# Patient Record
Sex: Female | Born: 1945
Health system: Southern US, Community
[De-identification: ages and names within clinical notes are randomized; demographics above are authoritative.]

## PROBLEM LIST (undated history)

## (undated) DIAGNOSIS — G4730 Sleep apnea, unspecified: Secondary | ICD-10-CM

## (undated) DIAGNOSIS — F329 Major depressive disorder, single episode, unspecified: Secondary | ICD-10-CM

## (undated) DIAGNOSIS — I5032 Chronic diastolic (congestive) heart failure: Secondary | ICD-10-CM

## (undated) DIAGNOSIS — I499 Cardiac arrhythmia, unspecified: Secondary | ICD-10-CM

## (undated) DIAGNOSIS — G43909 Migraine, unspecified, not intractable, without status migrainosus: Secondary | ICD-10-CM

## (undated) DIAGNOSIS — F32A Depression, unspecified: Secondary | ICD-10-CM

## (undated) DIAGNOSIS — S82141A Displaced bicondylar fracture of right tibia, initial encounter for closed fracture: Secondary | ICD-10-CM

## (undated) DIAGNOSIS — K219 Gastro-esophageal reflux disease without esophagitis: Secondary | ICD-10-CM

## (undated) DIAGNOSIS — I509 Heart failure, unspecified: Secondary | ICD-10-CM

## (undated) DIAGNOSIS — M1990 Unspecified osteoarthritis, unspecified site: Secondary | ICD-10-CM

## (undated) DIAGNOSIS — F40240 Claustrophobia: Secondary | ICD-10-CM

## (undated) DIAGNOSIS — I341 Nonrheumatic mitral (valve) prolapse: Secondary | ICD-10-CM

## (undated) DIAGNOSIS — IMO0002 Reserved for concepts with insufficient information to code with codable children: Secondary | ICD-10-CM

## (undated) DIAGNOSIS — F988 Other specified behavioral and emotional disorders with onset usually occurring in childhood and adolescence: Secondary | ICD-10-CM

## (undated) DIAGNOSIS — R011 Cardiac murmur, unspecified: Secondary | ICD-10-CM

## (undated) DIAGNOSIS — R0600 Dyspnea, unspecified: Secondary | ICD-10-CM

## (undated) HISTORY — PX: VASCULAR SURGERY: SHX849

## (undated) HISTORY — PX: COLONOSCOPY W/ POLYPECTOMY: SHX1380

## (undated) HISTORY — PX: TONSILLECTOMY: SUR1361

## (undated) HISTORY — PX: RETINAL TEAR REPAIR CRYOTHERAPY: SHX5304

## (undated) HISTORY — PX: JOINT REPLACEMENT: SHX530

## (undated) HISTORY — PX: EYE SURGERY: SHX253

## (undated) HISTORY — PX: VARICOSE VEIN SURGERY: SHX832

## (undated) HISTORY — PX: FRACTURE SURGERY: SHX138

---

## 1973-07-20 HISTORY — PX: WRIST SURGERY: SHX841

## 1998-08-22 ENCOUNTER — Other Ambulatory Visit: Admission: RE | Admit: 1998-08-22 | Discharge: 1998-08-22 | Payer: Self-pay | Admitting: Obstetrics and Gynecology

## 1998-10-01 ENCOUNTER — Encounter: Payer: Self-pay | Admitting: *Deleted

## 1998-10-01 ENCOUNTER — Emergency Department (HOSPITAL_COMMUNITY): Admission: EM | Admit: 1998-10-01 | Discharge: 1998-10-01 | Payer: Self-pay | Admitting: *Deleted

## 2000-03-23 ENCOUNTER — Other Ambulatory Visit: Admission: RE | Admit: 2000-03-23 | Discharge: 2000-03-23 | Payer: Self-pay | Admitting: Obstetrics and Gynecology

## 2000-04-27 ENCOUNTER — Other Ambulatory Visit: Admission: RE | Admit: 2000-04-27 | Discharge: 2000-04-27 | Payer: Self-pay | Admitting: Obstetrics and Gynecology

## 2001-03-31 ENCOUNTER — Other Ambulatory Visit: Admission: RE | Admit: 2001-03-31 | Discharge: 2001-03-31 | Payer: Self-pay | Admitting: Obstetrics and Gynecology

## 2002-04-05 ENCOUNTER — Other Ambulatory Visit: Admission: RE | Admit: 2002-04-05 | Discharge: 2002-04-05 | Payer: Self-pay | Admitting: Obstetrics and Gynecology

## 2003-01-30 ENCOUNTER — Ambulatory Visit (HOSPITAL_COMMUNITY): Admission: RE | Admit: 2003-01-30 | Discharge: 2003-01-30 | Payer: Self-pay | Admitting: Gastroenterology

## 2003-01-30 ENCOUNTER — Encounter (INDEPENDENT_AMBULATORY_CARE_PROVIDER_SITE_OTHER): Payer: Self-pay | Admitting: Specialist

## 2003-04-16 ENCOUNTER — Other Ambulatory Visit: Admission: RE | Admit: 2003-04-16 | Discharge: 2003-04-16 | Payer: Self-pay | Admitting: Obstetrics and Gynecology

## 2006-02-14 ENCOUNTER — Emergency Department (HOSPITAL_COMMUNITY): Admission: EM | Admit: 2006-02-14 | Discharge: 2006-02-14 | Payer: Self-pay | Admitting: Emergency Medicine

## 2006-04-14 ENCOUNTER — Encounter: Admission: RE | Admit: 2006-04-14 | Discharge: 2006-04-14 | Payer: Self-pay | Admitting: Obstetrics and Gynecology

## 2007-04-19 ENCOUNTER — Encounter: Admission: RE | Admit: 2007-04-19 | Discharge: 2007-04-19 | Payer: Self-pay | Admitting: Obstetrics and Gynecology

## 2007-04-22 ENCOUNTER — Encounter: Admission: RE | Admit: 2007-04-22 | Discharge: 2007-04-22 | Payer: Self-pay | Admitting: Obstetrics and Gynecology

## 2007-04-29 ENCOUNTER — Encounter (INDEPENDENT_AMBULATORY_CARE_PROVIDER_SITE_OTHER): Payer: Self-pay | Admitting: Diagnostic Radiology

## 2007-04-29 ENCOUNTER — Encounter: Admission: RE | Admit: 2007-04-29 | Discharge: 2007-04-29 | Payer: Self-pay | Admitting: Obstetrics and Gynecology

## 2007-10-23 ENCOUNTER — Encounter: Admission: RE | Admit: 2007-10-23 | Discharge: 2007-10-23 | Payer: Self-pay | Admitting: Orthopedic Surgery

## 2007-11-21 ENCOUNTER — Encounter: Admission: RE | Admit: 2007-11-21 | Discharge: 2007-11-21 | Payer: Self-pay | Admitting: Obstetrics and Gynecology

## 2007-12-19 HISTORY — PX: KNEE ARTHROSCOPY: SHX127

## 2008-01-11 ENCOUNTER — Ambulatory Visit (HOSPITAL_BASED_OUTPATIENT_CLINIC_OR_DEPARTMENT_OTHER): Admission: RE | Admit: 2008-01-11 | Discharge: 2008-01-11 | Payer: Self-pay | Admitting: Orthopedic Surgery

## 2008-07-24 ENCOUNTER — Encounter: Admission: RE | Admit: 2008-07-24 | Discharge: 2008-07-24 | Payer: Self-pay | Admitting: Obstetrics and Gynecology

## 2009-12-19 ENCOUNTER — Encounter: Admission: RE | Admit: 2009-12-19 | Discharge: 2009-12-19 | Payer: Self-pay | Admitting: Obstetrics and Gynecology

## 2010-08-09 ENCOUNTER — Encounter: Payer: Self-pay | Admitting: Family Medicine

## 2010-08-10 ENCOUNTER — Encounter: Payer: Self-pay | Admitting: Obstetrics and Gynecology

## 2010-12-02 NOTE — Op Note (Signed)
NAMEAZURE, BUDNICK                 ACCOUNT NO.:  [id number]   MEDICAL RECORD NO.:  [id number]          PATIENT TYPE:  AMB   LOCATION:  [facility]                         FACILITY:  [facility]   PHYSICIAN:  Ollen Gross, M.D.    DATE OF BIRTH:  [birth year]   DATE OF PROCEDURE:  01/11/2008  DATE OF DISCHARGE:                               OPERATIVE REPORT   PREOPERATIVE DIAGNOSES:  Right knee chondral defect.   POSTOPERATIVE DIAGNOSES:  Right knee chondral defect.  Medial meniscal  tear.   PROCEDURE:  Right-knee arthroscopy with medial meniscal debridement and  chondroplasty, medial and lateral compartments.   SURGEON:  Ollen Gross, M.D.   ASSISTANT:  None.   ANESTHESIA:  General.   ESTIMATED BLOOD LOSS:  Minimal.   DRAINS:  None.   COMPLICATIONS:  None.   CONDITION:  Stable to recovery.   Ms. Brotzman is a 65 year old female, who has had a several-month history of  significantly worsening right-knee pain and mechanical symptoms.  Exam  and history suggested meniscal tear, possible chondral defect.  MRI  confirmed the defect.  She has failed nonoperative management and  presents for arthroscopy and debridement.   PROCEDURE IN DETAIL:  After successful administration of general  anesthetic, a tourniquet was placed high on the right thigh and her  right lower extremity was prepped and draped in the usual sterile  fashion.  Standard superomedial and inferolateral incisions were made,  inflow cannula passed superomedial, camera passed inferolateral.  Arthroscopic visualization proceeds.  The undersurface of the patella  has some grade 2 change.  The trochlea has some grade 3 and 4 change,  especially centrally.  There does not appear to be any unstable  cartilage in the trochlea.  The medial and lateral gutters are  visualized and there are no loose bodies.   Flexion in valgus was applied to the knee and the medial compartment was  entered.  She has a significant tear in the body and  posterior horn of  the medial meniscus.  There is also a chondral defect with some exposed  bone on a posteromedial aspect of the medial tibial plateau and a larger  chondral delamination on the medial-most aspect of the medial femoral  condyle, starting centrally and coursing posteriorly.   A spinal needle was used to localize the inferomedial portal, a small  incision made and dilator placed.  The meniscus was debrided back to a  stable base with baskets and a 4.2 mm shaver and sealed off with the  ArthroCare device.  The chondral defects on the tibial plateau and femur  are debrided back to stable bony bases and stable cartilaginous edges.  The defect on the tibia was probably about 0.5 by 1 cm and on the femur  about 1 by 2 cm.  I abraded the bone for hopeful formation of some  fibrocartilage.  The intercondylar notch was visualized.  The ACL was  normal.  The lateral compartment was entered and the meniscus and femur  are normal.  There is an unusual chondral defect on the lateral tibial  plateau.  It measured about 0.5 by 1 cm and it looked like a clinical  punched-out area.  I debrided the central aspect down to a stable bony  base and the edges to stable cartilaginous edges.  We probed and found  it to be stable.   The joint was again inspected.  No further tears, loose bodies or  defects.  The arthroscopic equipment was then removed from the inferior  portals, which were closed with interrupted 4-0 nylon.  Then 20 mL of  0.25% Marcaine with epinephrine were injected through the inflow  cannula, then that was removed and that portal closed with nylon.  A  bulky sterile dressing was applied and she was then awakened and  transported to recovery in stable condition.      Ollen Gross, M.D.  Electronically Signed     FA/MEDQ  D:  01/11/2008  T:  01/11/2008  Job:  [id number]

## 2010-12-05 NOTE — Op Note (Signed)
   Anne Hogan, Anne Hogan                           ACCOUNT NO.:  [id number]   MEDICAL RECORD NO.:  [id number]                   PATIENT TYPE:  AMB   LOCATION:  ENDO                                 FACILITY:  [facility]   PHYSICIAN:  Petra Kuba, M.D.                 DATE OF BIRTH:  1946-06-20   DATE OF PROCEDURE:  01/30/2003  DATE OF DISCHARGE:                                 OPERATIVE REPORT   PROCEDURE:  Colonoscopy.   INDICATION:  History of colon polyps.  Due for a repeat screening.  Consent  was signed after risks, benefits, methods, options thoroughly discussed in  the office.   MEDICINES USED:  1. Demerol 50.  2. Versed 6.   DESCRIPTION OF PROCEDURE:  Rectal inspection was pertinent for external  hemorrhoids, small.  Digital exam was negative.  The video pediatric  adjustable colonoscope was inserted and with abdominal pressure, able to be  advanced around the colon to the cecum.  On insertion, a rare diverticula  was seen but no other abnormalities.  The cecum was identified by the  appendiceal orifice and the ileocecal valve.  The scope was slowly  withdrawn.  The prep was adequate.  There was some liquid stool that  required washing and suctioning.  Slow withdrawal through the colon, other  than a rare left-sided diverticula, no abnormalities were seen.  Once back  in the rectum, anorectal pull-through and retroflexion confirmed some small  hemorrhoids.  Scope was straightened and readvanced a short ways up the left  side of the colon; air was suctioned, and the scope removed.  The patient  tolerated the procedure well.  There was no obvious immediate complication.   ENDOSCOPIC DIAGNOSES:  1. Internal-external hemorrhoids.  2. Rare left-sided diverticula.  3. Otherwise, within normal limits to the cecum.   PLAN:  1. Repeat screening in five years.  2. Yearly rectals and guaiacs per either primary care or gynecologist.  3. Continue work-up with an EGD.  Please see that  dictation for other work-     up plans, findings, recommendations.                                               Petra Kuba, M.D.    MEM/MEDQ  D:  01/30/2003  T:  01/30/2003  Job:  [id number]

## 2010-12-05 NOTE — Op Note (Signed)
   Anne Hogan, Anne Hogan                           ACCOUNT NO.:  [id number]   MEDICAL RECORD NO.:  [id number]                   PATIENT TYPE:  AMB   LOCATION:  ENDO                                 FACILITY:  [facility]   PHYSICIAN:  Petra Kuba, M.D.                 DATE OF BIRTH:  11/05/1945   DATE OF PROCEDURE:  01/30/2003  DATE OF DISCHARGE:                                 OPERATIVE REPORT   PROCEDURE:  Esophagogastroduodenoscopy with biopsy.   INDICATIONS:  Longstanding upper tract symptoms.  Consent was signed after  risks, benefits, methods, options were thoroughly discussed in the office.   MEDICATIONS:  Additional medicines for this procedure Demerol 25 mg, Versed  2 mg only.   DESCRIPTION OF PROCEDURE:  Following the colonoscopy procedure, the video  endoscope was inserted by direct vision.  The proximal and mid esophagus was  normal.  In the distal esophagus was a small to medium sized hiatal hernia.  She did have some distal esophagitis with the top of hiatal hernia pouch.  Just below the GE junction was a small nodule which was biopsied at the end  of the procedure.  Scope passed into the stomach and advanced through the  antrum pertinent for some minimal antritis into the duodenum bulb pertinent  for some minimal bulbitis around the C-loop on the second and probably third  part of the duodenum.  The scope was slowly withdrawn back to the bulb.  No  other abnormalities were seen.  Stomach was evaluated on retroflexion and  straight visualization, nothing other than the hiatal hernia being confirmed  in the cardia. Fundus, annularis, lesser and greater curve were normal on  retroflexion and then straight visualization.  The scope was then slowly  withdrawn back to 20 cm which confirmed the normal mid and proximal  esophagus, confirmed the above findings in the distal esophagus and hiatal  hernia.  Biopsies were obtained from this juncture.  Air was suctioned,  scope removed.   The scope was removed.  The patient tolerated the procedure  well.  There was no obvious immediate complications.   ENDOSCOPIC DIAGNOSES:  1. Small hiatal hernia.  2. Minimal distal esophagitis.  3. Small gastroesophageal junction nodule, status post biopsy.  4. Minimal antritis and bulbitis.  5. Otherwise normal esophagogastroduodenoscopy.   PLAN:  Await pathology.  Continue pump inhibitors.  Reiterate that she needs  to stay on them.  Follow up with me p.r.n. or in three months.                                                Petra Kuba, M.D.    MEM/MEDQ  D:  01/30/2003  T:  01/30/2003  Job:  [id number]

## 2011-01-05 ENCOUNTER — Other Ambulatory Visit: Payer: Self-pay | Admitting: Internal Medicine

## 2011-01-05 DIAGNOSIS — Z1231 Encounter for screening mammogram for malignant neoplasm of breast: Secondary | ICD-10-CM

## 2011-01-15 ENCOUNTER — Ambulatory Visit
Admission: RE | Admit: 2011-01-15 | Discharge: 2011-01-15 | Disposition: A | Discharge status: Home / Self Care | Payer: BC Managed Care – PPO | Source: Ambulatory Visit | Attending: Internal Medicine | Admitting: Internal Medicine

## 2011-01-15 DIAGNOSIS — Z1231 Encounter for screening mammogram for malignant neoplasm of breast: Secondary | ICD-10-CM

## 2011-03-19 ENCOUNTER — Emergency Department (HOSPITAL_BASED_OUTPATIENT_CLINIC_OR_DEPARTMENT_OTHER)
Admission: EM | Admit: 2011-03-19 | Discharge: 2011-03-19 | Disposition: A | Discharge status: Home / Self Care | Payer: Medicare Other | Attending: Emergency Medicine | Admitting: Emergency Medicine

## 2011-03-19 ENCOUNTER — Encounter: Payer: Self-pay | Admitting: *Deleted

## 2011-03-19 ENCOUNTER — Emergency Department (INDEPENDENT_AMBULATORY_CARE_PROVIDER_SITE_OTHER): Payer: Medicare Other

## 2011-03-19 DIAGNOSIS — R079 Chest pain, unspecified: Secondary | ICD-10-CM

## 2011-03-19 DIAGNOSIS — M940 Chondrocostal junction syndrome [Tietze]: Secondary | ICD-10-CM | POA: Insufficient documentation

## 2011-03-19 HISTORY — DX: Gastro-esophageal reflux disease without esophagitis: K21.9

## 2011-03-19 LAB — CBC
Hemoglobin: 13.6 g/dL (ref 12.0–15.0)
RBC: 4.58 MIL/uL (ref 3.87–5.11)
RDW: 12.9 % (ref 11.5–15.5)

## 2011-03-19 LAB — CARDIAC PANEL(CRET KIN+CKTOT+MB+TROPI)
Total CK: 169 U/L (ref 7–177)
Troponin I: <0.3 ng/mL (ref <0.30)

## 2011-03-19 LAB — COMPREHENSIVE METABOLIC PANEL
ALT: 28 U/L (ref 0–35)
Albumin: 3.8 g/dL (ref 3.5–5.2)
BUN: 8 mg/dL (ref 6–23)
GFR calc Af Amer: >60 mL/min (ref >60)
Glucose, Bld: 104 mg/dL — ABNORMAL HIGH (ref 70–99)
Total Bilirubin: 0.3 mg/dL (ref 0.3–1.2)
Total Protein: 7.3 g/dL (ref 6.0–8.3)

## 2011-03-19 MED ORDER — IBUPROFEN 400 MG PO TABS
400.0000 mg | ORAL_TABLET | Freq: Once | ORAL | Status: AC
  Administered 2011-03-19: 400 mg via ORAL
  Filled 2011-03-19: qty 1

## 2011-03-19 MED ORDER — IBUPROFEN 600 MG PO TABS: 600.0000 mg | ORAL_TABLET | Freq: Three times a day (TID) | ORAL | Status: AC | PRN

## 2011-03-19 MED ORDER — ASPIRIN 325 MG PO TABS
325.0000 mg | ORAL_TABLET | Freq: Once | ORAL | Status: AC
  Administered 2011-03-19: 325 mg via ORAL
  Filled 2011-03-19: qty 1

## 2011-03-19 NOTE — ED Provider Notes (Signed)
History   The patient reports that over the past 10 to 14 days, she will have an occasional chest discomfort "like a wave (travelling up the sternum)" when she takes a deep inspiration.  If she takes more usual or shallow inspirations it goes away.  It is not with every deep breath, only occasional, and lasting only until the breath is over.  Otherwise, she denies dyspnea or chest pain, palpitations, nausea/vomiting, or diaphoresis.  She does not smoke, has no history of cardiac disease, and no significant family history of CAD.  She has GERD.  CSN: [id number] Arrival date & time: 03/19/2011  8:08 PM  Chief Complaint  Patient presents with  . Chest Pain   Patient is a 65 y.o. female presenting with chest pain. The history is provided by the patient.  Chest Pain The chest pain began 1 - 2 weeks ago. Duration of episode(s) is 20 minutes (symptoms only last several seconds at a time, during a deep inspiration, and are terminated with end of that breath, but will be recurrent if she takes another deep breath inside of that 20 or less minutes.). Chest pain occurs intermittently. The chest pain is resolved (recurrent). The pain is associated with breathing. The severity of the pain is moderate. The quality of the pain is described as pleuritic and pressure-like. Radiates to: cephalad, up the retrosternal region. Chest pain is worsened by deep breathing. Pertinent negatives for primary symptoms include no fever, no fatigue, no syncope, no shortness of breath, no cough, no wheezing, no palpitations, no abdominal pain, no nausea, no vomiting, no dizziness and no altered mental status.  Pertinent negatives for associated symptoms include no claudication, no diaphoresis, no lower extremity edema, no near-syncope, no numbness, no orthopnea, no paroxysmal nocturnal dyspnea and no weakness. She tried nothing for the symptoms. Risk factors include post-menopausal.  Pertinent negatives for past medical history include  no arrhythmia, no CAD, no congenital heart disease, no COPD, no CHF, no diabetes, no DVT, no MI and no PE.  Pertinent negatives for family medical history include: no CAD in family and no early MI in family.  Procedure history is negative for cardiac catheterization and echocardiogram.     Past Medical History  Diagnosis Date  . GERD (gastroesophageal reflux disease)     Past Surgical History  Procedure Date  . Knee arthroscopy   . Tonsillectomy     History reviewed. No pertinent family history.  History  Substance Use Topics  . Smoking status: Never Smoker   . Smokeless tobacco: Not on file  . Alcohol Use: No    OB History    Grav Para Term Preterm Abortions TAB SAB Ect Mult Living                  Review of Systems  Constitutional: Negative for fever, diaphoresis and fatigue.  HENT: Negative for neck pain.   Eyes: Negative.   Respiratory: Negative for cough, shortness of breath and wheezing.   Cardiovascular: Positive for chest pain. Negative for palpitations, orthopnea, claudication, leg swelling, syncope and near-syncope.  Gastrointestinal: Negative for nausea, vomiting and abdominal pain.  Musculoskeletal: Negative for back pain.  Skin: Negative for color change and pallor.  Neurological: Negative for dizziness, weakness and numbness.  Hematological: Does not bruise/bleed easily.  Psychiatric/Behavioral: Negative.  Negative for altered mental status.    Physical Exam  BP 133/68  Pulse 74  Temp(Src) 98.2 F (36.8 C) (Oral)  Resp 20  SpO2 99%  Physical Exam  Nursing note and vitals reviewed. Constitutional: She is oriented to person, place, and time. She appears well-developed and well-nourished. No distress.  HENT:  Head: Normocephalic and atraumatic.  Mouth/Throat: Oropharynx is clear and moist.  Eyes: EOM are normal. Pupils are equal, round, and reactive to light.  Neck: Normal range of motion. Neck supple. No JVD present. No tracheal deviation  present.  Cardiovascular: Normal rate, regular rhythm, normal heart sounds and intact distal pulses.   No extrasystoles are present. Exam reveals no gallop, no S3, no S4 and no friction rub.   No murmur heard. Pulmonary/Chest: Breath sounds normal. No accessory muscle usage or stridor. Not tachypneic. No respiratory distress. She has no wheezes. She has no rales. She exhibits no tenderness.  Abdominal: Soft. Bowel sounds are normal. She exhibits no distension. There is no tenderness.  Musculoskeletal: Normal range of motion. She exhibits no edema and no tenderness.  Neurological: She is alert and oriented to person, place, and time. She has normal reflexes. No cranial nerve deficit. Coordination normal.  Skin: Skin is warm and dry. No rash noted. She is not diaphoretic. No erythema. No pallor.  Psychiatric: She has a normal mood and affect. Her behavior is normal. Judgment and thought content normal.    ED Course  Procedures  MDM Musculoskeletal chest pain, costochondritis, GERD, Gastrointestinal Chest Pain, Pleuritic Chest Pain, Pneumonia, Pneumothorax, Pulmonary Embolism, Esophageal Spasm, Arrhythmia, Anginal chest pain considered among other potential etiologies in the patient's differential diagnosis.  Anginal Chest Pain is thought to be less likely based on the character of symptoms and the patient's past medical history/risk factors.    Date: 03/19/2011  Rate: 62  Rhythm: normal sinus rhythm  QRS Axis: normal  Intervals: normal  ST/T Wave abnormalities: nonspecific ST/T changes  Conduction Disutrbances:none  Narrative Interpretation: no acute ischemic changes  Old EKG Reviewed: No significant changes, ST and T waves with unchanged morphology   Results for orders placed during the hospital encounter of 03/19/11  CARDIAC PANEL(CRET KIN+CKTOT+MB+TROPI)      Component Value Range   Total CK 169  7 - 177 (U/L)   CK, MB 3.2  0.3 - 4.0 (ng/mL)   Troponin I <0.30  <0.30 (ng/mL)    Relative Index 1.9  0.0 - 2.5   CBC      Component Value Range   WBC 8.1  4.0 - 10.5 (K/uL)   RBC 4.58  3.87 - 5.11 (MIL/uL)   Hemoglobin 13.6  12.0 - 15.0 (g/dL)   HCT 16.1  09.6 - 04.5 (%)   MCV 87.6  78.0 - 100.0 (fL)   MCH 29.7  26.0 - 34.0 (pg)   MCHC 33.9  30.0 - 36.0 (g/dL)   RDW 40.9  81.1 - 91.4 (%)   Platelets 311  150 - 400 (K/uL)  COMPREHENSIVE METABOLIC PANEL      Component Value Range   Sodium 139  135 - 145 (mEq/L)   Potassium 3.7  3.5 - 5.1 (mEq/L)   Chloride 104  96 - 112 (mEq/L)   CO2 27  19 - 32 (mEq/L)   Glucose, Bld 104 (*) 70 - 99 (mg/dL)   BUN 8  6 - 23 (mg/dL)   Creatinine, Ser 7.82  0.50 - 1.10 (mg/dL)   Calcium 9.6  8.4 - 95.6 (mg/dL)   Total Protein 7.3  6.0 - 8.3 (g/dL)   Albumin 3.8  3.5 - 5.2 (g/dL)   AST 18  0 - 37 (U/L)   ALT 28  0 - 35 (U/L)   Alkaline Phosphatase 67  39 - 117 (U/L)   Total Bilirubin 0.3  0.3 - 1.2 (mg/dL)   GFR calc non Af Amer >60  >60 (mL/min)   GFR calc Af Amer >60  >60 (mL/min)  D-DIMER, QUANTITATIVE      Component Value Range   D-Dimer, Quant 0.37  0.00 - 0.48 (ug/mL-FEU)   PE excluded with d-dimer.  No suggestion of myocardial injury or ischemia.

## 2011-03-19 NOTE — ED Notes (Signed)
Pt states that she has had intermittent CP pain x 10-14 days when she takes a deep breath denies N/V or diaphoresis

## 2011-04-16 LAB — CBC
HCT: 39.2
Hemoglobin: 13.3
MCV: 88.6
Platelets: 315
RDW: 13.2

## 2011-04-16 LAB — BASIC METABOLIC PANEL
CO2: 28
GFR calc non Af Amer: >60
Sodium: 141

## 2011-04-16 LAB — URINALYSIS, ROUTINE W REFLEX MICROSCOPIC
Bilirubin Urine: NEGATIVE
Ketones, ur: NEGATIVE
Nitrite: NEGATIVE
Protein, ur: NEGATIVE
Urobilinogen, UA: 0.2

## 2011-04-16 LAB — PROTIME-INR: INR: 0.9

## 2011-04-16 LAB — URINE MICROSCOPIC-ADD ON

## 2012-02-18 DIAGNOSIS — F4320 Adjustment disorder, unspecified: Secondary | ICD-10-CM | POA: Diagnosis not present

## 2012-02-22 DIAGNOSIS — G4733 Obstructive sleep apnea (adult) (pediatric): Secondary | ICD-10-CM | POA: Diagnosis not present

## 2012-02-22 DIAGNOSIS — R82998 Other abnormal findings in urine: Secondary | ICD-10-CM | POA: Diagnosis not present

## 2012-02-22 DIAGNOSIS — R7301 Impaired fasting glucose: Secondary | ICD-10-CM | POA: Diagnosis not present

## 2012-02-22 DIAGNOSIS — F430 Acute stress reaction: Secondary | ICD-10-CM | POA: Diagnosis not present

## 2012-02-22 DIAGNOSIS — R5381 Other malaise: Secondary | ICD-10-CM | POA: Diagnosis not present

## 2012-02-26 DIAGNOSIS — H2510 Age-related nuclear cataract, unspecified eye: Secondary | ICD-10-CM | POA: Diagnosis not present

## 2012-03-07 DIAGNOSIS — F4320 Adjustment disorder, unspecified: Secondary | ICD-10-CM | POA: Diagnosis not present

## 2012-03-18 DIAGNOSIS — F4320 Adjustment disorder, unspecified: Secondary | ICD-10-CM | POA: Diagnosis not present

## 2012-03-25 DIAGNOSIS — F4320 Adjustment disorder, unspecified: Secondary | ICD-10-CM | POA: Diagnosis not present

## 2012-04-01 DIAGNOSIS — F4320 Adjustment disorder, unspecified: Secondary | ICD-10-CM | POA: Diagnosis not present

## 2012-04-07 DIAGNOSIS — F4320 Adjustment disorder, unspecified: Secondary | ICD-10-CM | POA: Diagnosis not present

## 2012-04-12 DIAGNOSIS — F4320 Adjustment disorder, unspecified: Secondary | ICD-10-CM | POA: Diagnosis not present

## 2012-04-21 DIAGNOSIS — F4320 Adjustment disorder, unspecified: Secondary | ICD-10-CM | POA: Diagnosis not present

## 2012-05-23 DIAGNOSIS — F4320 Adjustment disorder, unspecified: Secondary | ICD-10-CM | POA: Diagnosis not present

## 2012-06-01 DIAGNOSIS — F4320 Adjustment disorder, unspecified: Secondary | ICD-10-CM | POA: Diagnosis not present

## 2012-06-08 DIAGNOSIS — F4320 Adjustment disorder, unspecified: Secondary | ICD-10-CM | POA: Diagnosis not present

## 2012-06-13 DIAGNOSIS — F4320 Adjustment disorder, unspecified: Secondary | ICD-10-CM | POA: Diagnosis not present

## 2012-06-21 DIAGNOSIS — Z23 Encounter for immunization: Secondary | ICD-10-CM | POA: Diagnosis not present

## 2012-06-24 DIAGNOSIS — F4320 Adjustment disorder, unspecified: Secondary | ICD-10-CM | POA: Diagnosis not present

## 2012-06-28 DIAGNOSIS — M542 Cervicalgia: Secondary | ICD-10-CM | POA: Diagnosis not present

## 2012-06-28 DIAGNOSIS — R7301 Impaired fasting glucose: Secondary | ICD-10-CM | POA: Diagnosis not present

## 2012-06-28 DIAGNOSIS — M1710 Unilateral primary osteoarthritis, unspecified knee: Secondary | ICD-10-CM | POA: Diagnosis not present

## 2012-06-28 DIAGNOSIS — G4733 Obstructive sleep apnea (adult) (pediatric): Secondary | ICD-10-CM | POA: Diagnosis not present

## 2012-07-06 DIAGNOSIS — F4320 Adjustment disorder, unspecified: Secondary | ICD-10-CM | POA: Diagnosis not present

## 2012-07-11 DIAGNOSIS — F4320 Adjustment disorder, unspecified: Secondary | ICD-10-CM | POA: Diagnosis not present

## 2012-07-26 DIAGNOSIS — F4320 Adjustment disorder, unspecified: Secondary | ICD-10-CM | POA: Diagnosis not present

## 2012-08-04 DIAGNOSIS — M899 Disorder of bone, unspecified: Secondary | ICD-10-CM | POA: Diagnosis not present

## 2012-08-04 DIAGNOSIS — E559 Vitamin D deficiency, unspecified: Secondary | ICD-10-CM | POA: Diagnosis not present

## 2012-08-08 DIAGNOSIS — Z23 Encounter for immunization: Secondary | ICD-10-CM | POA: Diagnosis not present

## 2012-08-09 DIAGNOSIS — F4320 Adjustment disorder, unspecified: Secondary | ICD-10-CM | POA: Diagnosis not present

## 2012-08-18 DIAGNOSIS — F4320 Adjustment disorder, unspecified: Secondary | ICD-10-CM | POA: Diagnosis not present

## 2012-09-09 DIAGNOSIS — F4320 Adjustment disorder, unspecified: Secondary | ICD-10-CM | POA: Diagnosis not present

## 2012-09-19 DIAGNOSIS — F4320 Adjustment disorder, unspecified: Secondary | ICD-10-CM | POA: Diagnosis not present

## 2012-10-21 DIAGNOSIS — F4320 Adjustment disorder, unspecified: Secondary | ICD-10-CM | POA: Diagnosis not present

## 2012-10-31 DIAGNOSIS — F4320 Adjustment disorder, unspecified: Secondary | ICD-10-CM | POA: Diagnosis not present

## 2012-11-03 DIAGNOSIS — F4320 Adjustment disorder, unspecified: Secondary | ICD-10-CM | POA: Diagnosis not present

## 2012-11-11 DIAGNOSIS — F4320 Adjustment disorder, unspecified: Secondary | ICD-10-CM | POA: Diagnosis not present

## 2012-11-17 DIAGNOSIS — F4320 Adjustment disorder, unspecified: Secondary | ICD-10-CM | POA: Diagnosis not present

## 2012-11-30 DIAGNOSIS — F4320 Adjustment disorder, unspecified: Secondary | ICD-10-CM | POA: Diagnosis not present

## 2012-12-02 DIAGNOSIS — K219 Gastro-esophageal reflux disease without esophagitis: Secondary | ICD-10-CM | POA: Diagnosis not present

## 2012-12-02 DIAGNOSIS — R7301 Impaired fasting glucose: Secondary | ICD-10-CM | POA: Diagnosis not present

## 2012-12-02 DIAGNOSIS — R82998 Other abnormal findings in urine: Secondary | ICD-10-CM | POA: Diagnosis not present

## 2012-12-02 DIAGNOSIS — M1710 Unilateral primary osteoarthritis, unspecified knee: Secondary | ICD-10-CM | POA: Diagnosis not present

## 2012-12-02 DIAGNOSIS — M949 Disorder of cartilage, unspecified: Secondary | ICD-10-CM | POA: Diagnosis not present

## 2012-12-02 DIAGNOSIS — J301 Allergic rhinitis due to pollen: Secondary | ICD-10-CM | POA: Diagnosis not present

## 2012-12-02 DIAGNOSIS — I872 Venous insufficiency (chronic) (peripheral): Secondary | ICD-10-CM | POA: Diagnosis not present

## 2012-12-02 DIAGNOSIS — G4733 Obstructive sleep apnea (adult) (pediatric): Secondary | ICD-10-CM | POA: Diagnosis not present

## 2012-12-13 DIAGNOSIS — F4320 Adjustment disorder, unspecified: Secondary | ICD-10-CM | POA: Diagnosis not present

## 2012-12-27 DIAGNOSIS — R7301 Impaired fasting glucose: Secondary | ICD-10-CM | POA: Diagnosis not present

## 2012-12-27 DIAGNOSIS — M722 Plantar fascial fibromatosis: Secondary | ICD-10-CM | POA: Diagnosis not present

## 2012-12-27 DIAGNOSIS — M1710 Unilateral primary osteoarthritis, unspecified knee: Secondary | ICD-10-CM | POA: Diagnosis not present

## 2012-12-27 DIAGNOSIS — Z6831 Body mass index (BMI) 31.0-31.9, adult: Secondary | ICD-10-CM | POA: Diagnosis not present

## 2012-12-27 DIAGNOSIS — F909 Attention-deficit hyperactivity disorder, unspecified type: Secondary | ICD-10-CM | POA: Diagnosis not present

## 2013-01-05 DIAGNOSIS — F4320 Adjustment disorder, unspecified: Secondary | ICD-10-CM | POA: Diagnosis not present

## 2013-01-16 DIAGNOSIS — F4320 Adjustment disorder, unspecified: Secondary | ICD-10-CM | POA: Diagnosis not present

## 2013-01-30 DIAGNOSIS — F4320 Adjustment disorder, unspecified: Secondary | ICD-10-CM | POA: Diagnosis not present

## 2013-02-03 DIAGNOSIS — F4320 Adjustment disorder, unspecified: Secondary | ICD-10-CM | POA: Diagnosis not present

## 2013-02-10 DIAGNOSIS — F39 Unspecified mood [affective] disorder: Secondary | ICD-10-CM | POA: Diagnosis not present

## 2013-02-10 DIAGNOSIS — F909 Attention-deficit hyperactivity disorder, unspecified type: Secondary | ICD-10-CM | POA: Diagnosis not present

## 2013-02-10 DIAGNOSIS — F4320 Adjustment disorder, unspecified: Secondary | ICD-10-CM | POA: Diagnosis not present

## 2013-02-10 DIAGNOSIS — L259 Unspecified contact dermatitis, unspecified cause: Secondary | ICD-10-CM | POA: Diagnosis not present

## 2013-02-10 DIAGNOSIS — Z6831 Body mass index (BMI) 31.0-31.9, adult: Secondary | ICD-10-CM | POA: Diagnosis not present

## 2013-02-17 DIAGNOSIS — F4320 Adjustment disorder, unspecified: Secondary | ICD-10-CM | POA: Diagnosis not present

## 2013-03-01 DIAGNOSIS — H2510 Age-related nuclear cataract, unspecified eye: Secondary | ICD-10-CM | POA: Diagnosis not present

## 2013-03-02 DIAGNOSIS — F4320 Adjustment disorder, unspecified: Secondary | ICD-10-CM | POA: Diagnosis not present

## 2013-03-10 DIAGNOSIS — F4320 Adjustment disorder, unspecified: Secondary | ICD-10-CM | POA: Diagnosis not present

## 2013-03-15 DIAGNOSIS — F4320 Adjustment disorder, unspecified: Secondary | ICD-10-CM | POA: Diagnosis not present

## 2013-03-24 ENCOUNTER — Other Ambulatory Visit: Payer: Self-pay

## 2013-03-24 DIAGNOSIS — Z1231 Encounter for screening mammogram for malignant neoplasm of breast: Secondary | ICD-10-CM

## 2013-03-24 DIAGNOSIS — F4320 Adjustment disorder, unspecified: Secondary | ICD-10-CM | POA: Diagnosis not present

## 2013-04-04 ENCOUNTER — Encounter (HOSPITAL_COMMUNITY): Payer: Self-pay | Admitting: Emergency Medicine

## 2013-04-04 ENCOUNTER — Emergency Department (HOSPITAL_COMMUNITY)
Admission: EM | Admit: 2013-04-04 | Discharge: 2013-04-05 | Disposition: A | Discharge status: Home / Self Care | Payer: Medicare Other | Attending: Emergency Medicine | Admitting: Emergency Medicine

## 2013-04-04 ENCOUNTER — Emergency Department (HOSPITAL_COMMUNITY): Payer: Medicare Other

## 2013-04-04 DIAGNOSIS — K819 Cholecystitis, unspecified: Secondary | ICD-10-CM | POA: Diagnosis not present

## 2013-04-04 DIAGNOSIS — Z79899 Other long term (current) drug therapy: Secondary | ICD-10-CM | POA: Diagnosis not present

## 2013-04-04 DIAGNOSIS — Z880 Allergy status to penicillin: Secondary | ICD-10-CM | POA: Insufficient documentation

## 2013-04-04 DIAGNOSIS — K810 Acute cholecystitis: Secondary | ICD-10-CM | POA: Diagnosis not present

## 2013-04-04 DIAGNOSIS — R1013 Epigastric pain: Secondary | ICD-10-CM | POA: Diagnosis not present

## 2013-04-04 DIAGNOSIS — R109 Unspecified abdominal pain: Secondary | ICD-10-CM | POA: Diagnosis not present

## 2013-04-04 DIAGNOSIS — K8020 Calculus of gallbladder without cholecystitis without obstruction: Secondary | ICD-10-CM | POA: Diagnosis not present

## 2013-04-04 DIAGNOSIS — R350 Frequency of micturition: Secondary | ICD-10-CM | POA: Insufficient documentation

## 2013-04-04 DIAGNOSIS — Z6830 Body mass index (BMI) 30.0-30.9, adult: Secondary | ICD-10-CM | POA: Diagnosis not present

## 2013-04-04 LAB — COMPREHENSIVE METABOLIC PANEL
ALT: 12 U/L (ref 0–35)
Alkaline Phosphatase: 56 U/L (ref 39–117)
BUN: 10 mg/dL (ref 6–23)
CO2: 21 mEq/L (ref 19–32)
Calcium: 9.4 mg/dL (ref 8.4–10.5)
GFR calc Af Amer: 84 mL/min — ABNORMAL LOW (ref >90)
GFR calc non Af Amer: 72 mL/min — ABNORMAL LOW (ref >90)
Glucose, Bld: 126 mg/dL — ABNORMAL HIGH (ref 70–99)
Sodium: 135 mEq/L (ref 135–145)

## 2013-04-04 LAB — URINALYSIS, ROUTINE W REFLEX MICROSCOPIC
Protein, ur: 30 mg/dL — AB
Urobilinogen, UA: 1 mg/dL (ref 0.0–1.0)

## 2013-04-04 LAB — CBC WITH DIFFERENTIAL/PLATELET
Eosinophils Relative: 0 % (ref 0–5)
HCT: 39.8 % (ref 36.0–46.0)
Hemoglobin: 13.4 g/dL (ref 12.0–15.0)
Lymphocytes Relative: 9 % — ABNORMAL LOW (ref 12–46)
Lymphs Abs: 1 10*3/uL (ref 0.7–4.0)
MCV: 88.8 fL (ref 78.0–100.0)
Monocytes Relative: 5 % (ref 3–12)
Platelets: 414 10*3/uL — ABNORMAL HIGH (ref 150–400)
RBC: 4.48 MIL/uL (ref 3.87–5.11)
WBC: 11.3 10*3/uL — ABNORMAL HIGH (ref 4.0–10.5)

## 2013-04-04 LAB — URINE MICROSCOPIC-ADD ON

## 2013-04-04 MED ORDER — ONDANSETRON HCL 4 MG/2ML IJ SOLN
4.0000 mg | Freq: Once | INTRAMUSCULAR | Status: AC
  Administered 2013-04-04: 4 mg via INTRAVENOUS
  Filled 2013-04-04: qty 2

## 2013-04-04 MED ORDER — IOHEXOL 300 MG/ML  SOLN
50.0000 mL | Freq: Once | INTRAMUSCULAR | Status: AC | PRN
  Administered 2013-04-04: 50 mL via ORAL

## 2013-04-04 MED ORDER — IOHEXOL 300 MG/ML  SOLN
100.0000 mL | Freq: Once | INTRAMUSCULAR | Status: AC | PRN
  Administered 2013-04-04: 100 mL via INTRAVENOUS

## 2013-04-04 NOTE — ED Provider Notes (Signed)
CSN: [id number]     Arrival date & time 04/04/13  1802 History   First MD Initiated Contact with Patient 04/04/13 2125     Chief Complaint  Patient presents with  . Abdominal Pain   (Consider location/radiation/quality/duration/timing/severity/associated sxs/prior Treatment) Patient is a 67 y.o. female presenting with abdominal pain. The history is provided by the patient and medical records. No language interpreter was used.  Abdominal Pain Pain location:  RUQ Pain quality: gnawing   Pain radiates to:  Epigastric region Pain severity:  Mild Onset quality:  Sudden Duration:  1 day Timing:  Intermittent Progression:  Waxing and waning Chronicity:  New Worsened by:  Nothing tried Associated symptoms: no fever    Patient seen by her PCP in the office today and sent to the ED for additional evaluation.   Past Medical History  Diagnosis Date  . GERD (gastroesophageal reflux disease)    Past Surgical History  Procedure Laterality Date  . Knee arthroscopy    . Tonsillectomy     No family history on file. History  Substance Use Topics  . Smoking status: Never Smoker   . Smokeless tobacco: Not on file  . Alcohol Use: No   OB History   Grav Para Term Preterm Abortions TAB SAB Ect Mult Living                 Review of Systems  Constitutional: Negative for fever.  Gastrointestinal: Positive for abdominal pain.  Genitourinary: Positive for frequency.  All other systems reviewed and are negative.    Allergies  Penicillins and Codeine  Home Medications   Current Outpatient Rx  Name  Route  Sig  Dispense  Refill  . buPROPion (WELLBUTRIN XL) 300 MG 24 hr tablet   Oral   Take 300 mg by mouth daily.         [name] escitalopram (LEXAPRO) 10 MG tablet   Oral   Take 10 mg by mouth daily.         . Vitamin D, Ergocalciferol, (DRISDOL) 50000 UNITS CAPS   Oral   Take 50,000 Units by mouth 2 (two) times a week.            BP 140/64  Pulse 62  Temp(Src) 98 F (36.7 C)  (Oral)  Resp 20  SpO2 100% Physical Exam  Nursing note and vitals reviewed. Constitutional: She is oriented to person, place, and time. She appears well-developed and well-nourished.  HENT:  Head: Normocephalic and atraumatic.  Eyes: Pupils are equal, round, and reactive to light.  Neck: Normal range of motion.  Cardiovascular: Normal rate.   Pulmonary/Chest: Effort normal.  Abdominal: Soft. There is tenderness.  Musculoskeletal: She exhibits no edema and no tenderness.  Lymphadenopathy:    She has no cervical adenopathy.  Neurological: She is alert and oriented to person, place, and time.  Skin: Skin is warm and dry.  Psychiatric: She has a normal mood and affect. Her behavior is normal. Judgment and thought content normal.    ED Course  Procedures (including critical care time) Labs Review Labs Reviewed  CBC WITH DIFFERENTIAL - Abnormal; Notable for the following:    WBC 11.3 (*)    Platelets 414 (*)    Neutrophils Relative % 86 (*)    Neutro Abs 9.7 (*)    Lymphocytes Relative 9 (*)    All other components within normal limits  COMPREHENSIVE METABOLIC PANEL - Abnormal; Notable for the following:    Glucose, Bld 126 (*)  GFR calc non Af Amer 72 (*)    GFR calc Af Amer 84 (*)    All other components within normal limits  LIPASE, BLOOD  URINALYSIS, ROUTINE W REFLEX MICROSCOPIC  URINE MICROSCOPIC-ADD ON   Imaging Review No results found. Radiology results reviewed and shared with patient.  Discussed CT results with Dr. Daphine Deutscher.  As patient's symptoms have currently resolved, and patient is without leukocytosis or elevated LFT's, patient may be discharged home with pain and nausea medication--patient should call the office in the morning to schedule follow-up in the clinic. MDM  Early cholecystitis.      Jimmye Norman, NP 04/05/13 [phone]

## 2013-04-04 NOTE — ED Notes (Signed)
Patient transported to CT 

## 2013-04-04 NOTE — ED Notes (Signed)
Pt states that since last night, she has had rt sided abd pain.  States that she has been nauseated but has not vomited.  States that years ago she was told that she has gallstones.

## 2013-04-05 ENCOUNTER — Encounter (INDEPENDENT_AMBULATORY_CARE_PROVIDER_SITE_OTHER): Payer: Self-pay | Admitting: General Surgery

## 2013-04-05 ENCOUNTER — Ambulatory Visit (INDEPENDENT_AMBULATORY_CARE_PROVIDER_SITE_OTHER): Payer: Medicare Other | Admitting: General Surgery

## 2013-04-05 VITALS — BP 120/70 | HR 67 | Temp 96.5°F | Resp 14 | Ht 62.5 in | Wt 167.8 lb

## 2013-04-05 DIAGNOSIS — K8020 Calculus of gallbladder without cholecystitis without obstruction: Secondary | ICD-10-CM

## 2013-04-05 MED ORDER — ONDANSETRON 4 MG PO TBDP: 4.0000 mg | ORAL_TABLET | Freq: Three times a day (TID) | ORAL | Status: DC | PRN

## 2013-04-05 MED ORDER — HYDROCODONE-ACETAMINOPHEN 5-325 MG PO TABS: 1.0000 | ORAL_TABLET | Freq: Four times a day (QID) | ORAL | Status: DC | PRN

## 2013-04-05 NOTE — Progress Notes (Signed)
Patient ID: Anne Hogan, female   DOB: [birth year], 67 y.o.   MRN: [id number]  Chief Complaint  Patient presents with  . New Evaluation    eval gallbladder    HPI Anne Hogan is a 67 y.o. female.  The patient is a 67 year old female who underwent evaluation at [facility] long ED for right upper quadrant pain. Patient underwent a CT scan which revealed gallstones and questionable pericholecystic fluid. Patient's laboratory studies were within normal limits. Patient states that the pain began yesterday and progressed throughout the day. She states that prior to being seen in the ER her pain did slowly resolve. She states she known she has a history of gallstones and has never had any symptoms.  HPI  Past Medical History  Diagnosis Date  . GERD (gastroesophageal reflux disease)     Past Surgical History  Procedure Laterality Date  . Knee arthroscopy    . Tonsillectomy      No family history on file.  Social History History  Substance Use Topics  . Smoking status: Never Smoker   . Smokeless tobacco: Not on file  . Alcohol Use: No    Allergies  Allergen Reactions  . Penicillins Rash  . Codeine Other (See Comments)    Possible sensitivity, patient not sure    Current Outpatient Prescriptions  Medication Sig Dispense Refill  . buPROPion (WELLBUTRIN XL) 300 MG 24 hr tablet Take 300 mg by mouth daily.      [name] escitalopram (LEXAPRO) 10 MG tablet Take 10 mg by mouth daily.      . Vitamin D, Ergocalciferol, (DRISDOL) 50000 UNITS CAPS Take 50,000 Units by mouth 2 (two) times a week.        [name] HYDROcodone-acetaminophen (NORCO/VICODIN) 5-325 MG per tablet Take 1 tablet by mouth every 6 (six) hours as needed for pain.  10 tablet  0  . ondansetron (ZOFRAN-ODT) 4 MG disintegrating tablet Take 1 tablet (4 mg total) by mouth every 8 (eight) hours as needed for nausea.  12 tablet  0   No current facility-administered medications for this visit.    Review of Systems Review of Systems   Constitutional: Negative.   HENT: Negative.   Respiratory: Negative.   Cardiovascular: Negative.   Gastrointestinal: Positive for abdominal pain.  All other systems reviewed and are negative.    Blood pressure 120/70, pulse 67, temperature 96.5 F (35.8 C), temperature source Temporal, resp. rate 14, height 5' 2.5" (1.588 m), weight 167 lb 12.8 oz (76.114 kg), SpO2 97.00%.  Physical Exam Physical Exam  Constitutional: She is oriented to person, place, and time. She appears well-developed and well-nourished.  HENT:  Head: Normocephalic and atraumatic.  Eyes: Conjunctivae and EOM are normal. Pupils are equal, round, and reactive to light.  Neck: Normal range of motion.  Cardiovascular: Normal rate, regular rhythm and normal heart sounds.   Abdominal: Soft. Bowel sounds are normal. There is tenderness (ruq). There is no rebound and no guarding.  Musculoskeletal: Normal range of motion.  Neurological: She is alert and oriented to person, place, and time.    Data Reviewed LFTs within normal limits, CT scan reveals gallstones and pericholecystic fluid.  Assessment    67 year old female with symptomatic cholelithiasis     Plan     1. We'll proceed to the operating room for a laparoscopic cholecystectomy with IOC 2.All risks and benefits were discussed with the patient to generally include: infection, bleeding, possible need for post op ERCP, damage to the bile ducts,  and bile leak. Alternatives were offered and described.  All questions were answered and the patient voiced understanding of the procedure and wishes to proceed at this point with a laparoscopic cholecystectomy         Marigene Ehlers., [facility] 04/05/2013, 2:33 PM

## 2013-04-05 NOTE — ED Notes (Signed)
General Surgery at bedside.

## 2013-04-06 ENCOUNTER — Encounter (INDEPENDENT_AMBULATORY_CARE_PROVIDER_SITE_OTHER): Payer: Self-pay | Admitting: General Surgery

## 2013-04-06 LAB — URINE CULTURE

## 2013-04-07 ENCOUNTER — Encounter (HOSPITAL_COMMUNITY): Payer: Self-pay | Admitting: Pharmacy Technician

## 2013-04-07 NOTE — ED Provider Notes (Signed)
Medical screening examination/treatment/procedure(s) were performed by non-physician practitioner and as supervising physician I was immediately available for consultation/collaboration.  Ova Meegan K Linker, MD 04/07/13 0915 

## 2013-04-13 ENCOUNTER — Encounter (HOSPITAL_COMMUNITY): Payer: Self-pay

## 2013-04-13 ENCOUNTER — Encounter (HOSPITAL_COMMUNITY)
Admission: RE | Admit: 2013-04-13 | Discharge: 2013-04-13 | Disposition: A | Discharge status: Home / Self Care | Payer: Medicare Other | Source: Ambulatory Visit | Attending: General Surgery | Admitting: General Surgery

## 2013-04-13 ENCOUNTER — Ambulatory Visit
Admission: RE | Admit: 2013-04-13 | Discharge: 2013-04-13 | Disposition: A | Discharge status: Home / Self Care | Payer: Medicare Other | Source: Ambulatory Visit

## 2013-04-13 DIAGNOSIS — Z1231 Encounter for screening mammogram for malignant neoplasm of breast: Secondary | ICD-10-CM | POA: Diagnosis not present

## 2013-04-13 DIAGNOSIS — Z01818 Encounter for other preprocedural examination: Secondary | ICD-10-CM | POA: Insufficient documentation

## 2013-04-13 HISTORY — DX: Depression, unspecified: F32.A

## 2013-04-13 HISTORY — DX: Sleep apnea, unspecified: G47.30

## 2013-04-13 HISTORY — DX: Unspecified osteoarthritis, unspecified site: M19.90

## 2013-04-13 HISTORY — DX: Other specified behavioral and emotional disorders with onset usually occurring in childhood and adolescence: F98.8

## 2013-04-13 HISTORY — DX: Major depressive disorder, single episode, unspecified: F32.9

## 2013-04-13 HISTORY — DX: Migraine, unspecified, not intractable, without status migrainosus: G43.909

## 2013-04-13 HISTORY — DX: Claustrophobia: F40.240

## 2013-04-13 NOTE — Progress Notes (Signed)
Patient informed Nurse that she had a stress test < 10 years ago at [facility]. Patient denied having a cardiac cath but informed Nurse that she did have a sleep study and was found to have mild sleep apnea. However due to claustrophobia patient does not wear a machine at bedtime instead she wears a "mouth guard." Will request sleep study from PCP Dr. Larina Earthly at [facility]. Since lab work was obtained on 04/04/13 labs were not obtained during PAT visit.

## 2013-04-13 NOTE — Pre-Procedure Instructions (Signed)
LAVENE PENAGOS  04/13/2013   Your procedure is scheduled on:  Tuesday April 18, 2013.  Report to [facility] Short Stay Entrance "A" at 10:45 AM.  Call this number if you have problems the morning of surgery: [phone]   Remember:   Do not eat food or drink liquids after midnight.   Take these medicines the morning of surgery with A SIP OF WATER: Bupropion (Wellbutrin XL), Escitalopram (Lexapro), Hydrocodone (Vicodin) if needed for pain, and Ondansetron (Zofran) if needed for nausea.   Do not wear jewelry, make-up or nail polish.  Do not wear lotions, powders, or perfumes. You may wear deodorant.  Do not shave 48 hours prior to surgery.   Do not bring valuables to the hospital.  [facility] is not responsible for any belongings or valuables.               Contacts, dentures or bridgework may not be worn into surgery.  Leave suitcase in the car. After surgery it may be brought to your room.  For patients admitted to the hospital, discharge time is determined by your treatment team.               Patients discharged the day of surgery will not be allowed to drive home.  Name and phone number of your driver: Family/Friend  Special Instructions: Shower using CHG 2 nights before surgery and the night before surgery.  If you shower the day of surgery use CHG.  Use special wash - you have one bottle of CHG for all showers.  You should use approximately 1/3 of the bottle for each shower.   Please read over the following fact sheets that you were given: Pain Booklet, Coughing and Deep Breathing and Surgical Site Infection Prevention

## 2013-04-17 MED ORDER — CIPROFLOXACIN IN D5W 400 MG/200ML IV SOLN
400.0000 mg | INTRAVENOUS | Status: AC
  Administered 2013-04-18: 400 mg via INTRAVENOUS
  Filled 2013-04-17: qty 200

## 2013-04-18 ENCOUNTER — Encounter (HOSPITAL_COMMUNITY): Payer: Self-pay | Admitting: *Deleted

## 2013-04-18 ENCOUNTER — Observation Stay (HOSPITAL_COMMUNITY)
Admission: RE | Admit: 2013-04-18 | Discharge: 2013-04-19 | Disposition: A | Discharge status: Home / Self Care | Payer: Medicare Other | Source: Ambulatory Visit | Attending: General Surgery | Admitting: General Surgery

## 2013-04-18 ENCOUNTER — Encounter (HOSPITAL_COMMUNITY): Payer: Self-pay | Admitting: Anesthesiology

## 2013-04-18 ENCOUNTER — Encounter (HOSPITAL_COMMUNITY)
Admission: RE | Disposition: A | Discharge status: Home / Self Care | Payer: Self-pay | Source: Ambulatory Visit | Attending: General Surgery

## 2013-04-18 ENCOUNTER — Ambulatory Visit (HOSPITAL_COMMUNITY): Payer: Medicare Other | Admitting: Anesthesiology

## 2013-04-18 ENCOUNTER — Ambulatory Visit (HOSPITAL_COMMUNITY): Payer: Medicare Other

## 2013-04-18 DIAGNOSIS — Z79899 Other long term (current) drug therapy: Secondary | ICD-10-CM | POA: Diagnosis not present

## 2013-04-18 DIAGNOSIS — K8000 Calculus of gallbladder with acute cholecystitis without obstruction: Secondary | ICD-10-CM | POA: Diagnosis not present

## 2013-04-18 DIAGNOSIS — K219 Gastro-esophageal reflux disease without esophagitis: Secondary | ICD-10-CM | POA: Diagnosis not present

## 2013-04-18 DIAGNOSIS — K8020 Calculus of gallbladder without cholecystitis without obstruction: Secondary | ICD-10-CM | POA: Diagnosis not present

## 2013-04-18 DIAGNOSIS — Z9049 Acquired absence of other specified parts of digestive tract: Secondary | ICD-10-CM

## 2013-04-18 HISTORY — PX: CHOLECYSTECTOMY: SHX55

## 2013-04-18 HISTORY — DX: Reserved for concepts with insufficient information to code with codable children: IMO0002

## 2013-04-18 SURGERY — LAPAROSCOPIC CHOLECYSTECTOMY WITH INTRAOPERATIVE CHOLANGIOGRAM
Anesthesia: General | Site: Abdomen | Wound class: Contaminated

## 2013-04-18 MED ORDER — GLYCOPYRROLATE 0.2 MG/ML IJ SOLN
INTRAMUSCULAR | Status: DC | PRN
  Administered 2013-04-18: .4 mg via INTRAVENOUS

## 2013-04-18 MED ORDER — HYDROMORPHONE HCL PF 1 MG/ML IJ SOLN
INTRAMUSCULAR | Status: AC
  Filled 2013-04-18: qty 1

## 2013-04-18 MED ORDER — LACTATED RINGERS IV SOLN
INTRAVENOUS | Status: DC | PRN
  Administered 2013-04-18 (×2): via INTRAVENOUS

## 2013-04-18 MED ORDER — MIDAZOLAM HCL 5 MG/5ML IJ SOLN
INTRAMUSCULAR | Status: DC | PRN
  Administered 2013-04-18: 1 mg via INTRAVENOUS

## 2013-04-18 MED ORDER — ONDANSETRON HCL 4 MG/2ML IJ SOLN: 4.0000 mg | Freq: Four times a day (QID) | INTRAMUSCULAR | Status: DC | PRN

## 2013-04-18 MED ORDER — ONDANSETRON HCL 4 MG PO TABS: 4.0000 mg | ORAL_TABLET | Freq: Four times a day (QID) | ORAL | Status: DC | PRN

## 2013-04-18 MED ORDER — SODIUM CHLORIDE 0.9 % IJ SOLN: 3.0000 mL | Freq: Two times a day (BID) | INTRAMUSCULAR | Status: DC

## 2013-04-18 MED ORDER — FENTANYL CITRATE 0.05 MG/ML IJ SOLN: 50.0000 ug | Freq: Once | INTRAMUSCULAR | Status: DC

## 2013-04-18 MED ORDER — OXYCODONE HCL 5 MG PO TABS
5.0000 mg | ORAL_TABLET | ORAL | Status: DC | PRN
  Administered 2013-04-19: 5 mg via ORAL
  Filled 2013-04-18: qty 2

## 2013-04-18 MED ORDER — CHLORHEXIDINE GLUCONATE 4 % EX LIQD: 1.0000 "application " | Freq: Once | CUTANEOUS | Status: DC

## 2013-04-18 MED ORDER — SODIUM CHLORIDE 0.9 % IJ SOLN: 3.0000 mL | INTRAMUSCULAR | Status: DC | PRN

## 2013-04-18 MED ORDER — BUPIVACAINE HCL (PF) 0.25 % IJ SOLN
INTRAMUSCULAR | Status: AC
  Filled 2013-04-18: qty 30

## 2013-04-18 MED ORDER — FENTANYL CITRATE 0.05 MG/ML IJ SOLN
INTRAMUSCULAR | Status: DC | PRN
  Administered 2013-04-18: 100 ug via INTRAVENOUS

## 2013-04-18 MED ORDER — HYDROCODONE-ACETAMINOPHEN 5-325 MG PO TABS
1.0000 | ORAL_TABLET | Freq: Four times a day (QID) | ORAL | Status: DC | PRN
  Administered 2013-04-19: 1 via ORAL
  Filled 2013-04-18: qty 1

## 2013-04-18 MED ORDER — PROPOFOL 10 MG/ML IV BOLUS
INTRAVENOUS | Status: DC | PRN
  Administered 2013-04-18: 150 mg via INTRAVENOUS

## 2013-04-18 MED ORDER — SODIUM CHLORIDE 0.9 % IV SOLN: 250.0000 mL | INTRAVENOUS | Status: DC | PRN

## 2013-04-18 MED ORDER — HYDROMORPHONE HCL PF 1 MG/ML IJ SOLN
0.2500 mg | INTRAMUSCULAR | Status: DC | PRN
  Administered 2013-04-18 (×3): 0.5 mg via INTRAVENOUS

## 2013-04-18 MED ORDER — LACTATED RINGERS IV SOLN
INTRAVENOUS | Status: DC
  Administered 2013-04-18: 11:00:00 via INTRAVENOUS

## 2013-04-18 MED ORDER — ROCURONIUM BROMIDE 100 MG/10ML IV SOLN
INTRAVENOUS | Status: DC | PRN
  Administered 2013-04-18: 35 mg via INTRAVENOUS

## 2013-04-18 MED ORDER — ACETAMINOPHEN 650 MG RE SUPP: 650.0000 mg | RECTAL | Status: DC | PRN

## 2013-04-18 MED ORDER — ACETAMINOPHEN 325 MG PO TABS: 650.0000 mg | ORAL_TABLET | ORAL | Status: DC | PRN

## 2013-04-18 MED ORDER — ONDANSETRON HCL 4 MG/2ML IJ SOLN
INTRAMUSCULAR | Status: DC | PRN
  Administered 2013-04-18: 4 mg via INTRAVENOUS

## 2013-04-18 MED ORDER — LIDOCAINE HCL (CARDIAC) 20 MG/ML IV SOLN
INTRAVENOUS | Status: DC | PRN
  Administered 2013-04-18: 50 mg via INTRAVENOUS

## 2013-04-18 MED ORDER — EPHEDRINE SULFATE 50 MG/ML IJ SOLN
INTRAMUSCULAR | Status: DC | PRN
  Administered 2013-04-18: 10 mg via INTRAVENOUS

## 2013-04-18 MED ORDER — NEOSTIGMINE METHYLSULFATE 1 MG/ML IJ SOLN
INTRAMUSCULAR | Status: DC | PRN
  Administered 2013-04-18: 3 mg via INTRAVENOUS

## 2013-04-18 MED ORDER — MIDAZOLAM HCL 2 MG/2ML IJ SOLN: 1.0000 mg | INTRAMUSCULAR | Status: DC | PRN

## 2013-04-18 MED ORDER — PROMETHAZINE HCL 25 MG/ML IJ SOLN: 6.2500 mg | INTRAMUSCULAR | Status: DC | PRN

## 2013-04-18 MED ORDER — ONDANSETRON HCL 4 MG/2ML IJ SOLN
4.0000 mg | Freq: Four times a day (QID) | INTRAMUSCULAR | Status: DC | PRN
  Administered 2013-04-18: 4 mg via INTRAVENOUS
  Filled 2013-04-18: qty 2

## 2013-04-18 MED ORDER — OXYCODONE-ACETAMINOPHEN 10-325 MG PO TABS: 1.0000 | ORAL_TABLET | ORAL | Status: DC | PRN

## 2013-04-18 SURGICAL SUPPLY — 49 items
APL SKNCLS STERI-STRIP NONHPOA (GAUZE/BANDAGES/DRESSINGS) ×1
BAG SPEC RTRVL LRG 6X4 10 (ENDOMECHANICALS)
BENZOIN TINCTURE PRP APPL 2/3 (GAUZE/BANDAGES/DRESSINGS) ×2 IMPLANT
CANISTER SUCTION 2500CC (MISCELLANEOUS) ×2 IMPLANT
CHLORAPREP W/TINT 26ML (MISCELLANEOUS) ×2 IMPLANT
CLIP LIGATING HEMO O LOK GREEN (MISCELLANEOUS) ×2 IMPLANT
CLOTH BEACON ORANGE TIMEOUT ST (SAFETY) ×2 IMPLANT
COVER MAYO STAND STRL (DRAPES) ×2 IMPLANT
COVER SURGICAL LIGHT HANDLE (MISCELLANEOUS) ×2 IMPLANT
COVER TRANSDUCER ULTRASND (DRAPES) IMPLANT
DEVICE TROCAR PUNCTURE CLOSURE (ENDOMECHANICALS) ×2 IMPLANT
DRAPE C-ARM 42X72 X-RAY (DRAPES) ×2 IMPLANT
DRAPE UTILITY 15X26 W/TAPE STR (DRAPE) ×4 IMPLANT
ELECT REM PT RETURN 9FT ADLT (ELECTROSURGICAL) ×2
ELECTRODE REM PT RTRN 9FT ADLT (ELECTROSURGICAL) ×1 IMPLANT
ENDOLOOP SUT PDS II  0 18 (SUTURE) ×1
ENDOLOOP SUT PDS II 0 18 (SUTURE) ×1 IMPLANT
GAUZE SPONGE 2X2 8PLY STRL LF (GAUZE/BANDAGES/DRESSINGS) ×1 IMPLANT
GLOVE BIO SURGEON STRL SZ7.5 (GLOVE) ×2 IMPLANT
GLOVE BIO SURGEON STRL SZ8 (GLOVE) ×1 IMPLANT
GLOVE BIOGEL PI IND STRL 7.0 (GLOVE) IMPLANT
GLOVE BIOGEL PI IND STRL 8 (GLOVE) IMPLANT
GLOVE BIOGEL PI INDICATOR 7.0 (GLOVE) ×3
GLOVE BIOGEL PI INDICATOR 8 (GLOVE) ×1
GLOVE SURG SS PI 6.5 STRL IVOR (GLOVE) ×1 IMPLANT
GLOVE SURG SS PI 7.0 STRL IVOR (GLOVE) ×1 IMPLANT
GOWN STRL NON-REIN LRG LVL3 (GOWN DISPOSABLE) ×4 IMPLANT
GOWN STRL REIN XL XLG (GOWN DISPOSABLE) ×3 IMPLANT
IV CATH 14GX2 1/4 (CATHETERS) ×2 IMPLANT
KIT BASIN OR (CUSTOM PROCEDURE TRAY) ×2 IMPLANT
KIT ROOM TURNOVER OR (KITS) ×2 IMPLANT
NEEDLE INSUFFLATION 14GA 120MM (NEEDLE) ×2 IMPLANT
NS IRRIG 1000ML POUR BTL (IV SOLUTION) ×2 IMPLANT
PAD ARMBOARD 7.5X6 YLW CONV (MISCELLANEOUS) ×4 IMPLANT
POUCH SPECIMEN RETRIEVAL 10MM (ENDOMECHANICALS) IMPLANT
SCISSORS LAP 5X35 DISP (ENDOMECHANICALS) ×2 IMPLANT
SET CHOLANGIOGRAPHY FRANKLIN (SET/KITS/TRAYS/PACK) ×2 IMPLANT
SET IRRIG TUBING LAPAROSCOPIC (IRRIGATION / IRRIGATOR) ×2 IMPLANT
SLEEVE ENDOPATH XCEL 5M (ENDOMECHANICALS) ×2 IMPLANT
SPECIMEN JAR SMALL (MISCELLANEOUS) ×2 IMPLANT
SPONGE GAUZE 2X2 STER 10/PKG (GAUZE/BANDAGES/DRESSINGS) ×1
STRIP CLOSURE SKIN 1/2X4 (GAUZE/BANDAGES/DRESSINGS) ×1 IMPLANT
SUT MNCRL AB 3-0 PS2 18 (SUTURE) ×2 IMPLANT
TAPE CLOTH SURG 4X10 WHT LF (GAUZE/BANDAGES/DRESSINGS) ×1 IMPLANT
TOWEL OR 17X24 6PK STRL BLUE (TOWEL DISPOSABLE) ×2 IMPLANT
TOWEL OR 17X26 10 PK STRL BLUE (TOWEL DISPOSABLE) ×2 IMPLANT
TRAY LAPAROSCOPIC (CUSTOM PROCEDURE TRAY) ×2 IMPLANT
TROCAR XCEL NON-BLD 11X100MML (ENDOMECHANICALS) ×2 IMPLANT
TROCAR XCEL NON-BLD 5MMX100MML (ENDOMECHANICALS) ×2 IMPLANT

## 2013-04-18 NOTE — Anesthesia Postprocedure Evaluation (Signed)
  Anesthesia Post-op Note  Patient: Anne Hogan  Procedure(s) Performed: Procedure(s): LAPAROSCOPIC CHOLECYSTECTOMY WITH INTRAOPERATIVE CHOLANGIOGRAM (N/A)  Patient Location: PACU  Anesthesia Type:General  Level of Consciousness: awake and alert   Airway and Oxygen Therapy: Patient Spontanous Breathing  Post-op Pain: mild  Post-op Assessment: Post-op Vital signs reviewed, Patient's Cardiovascular Status Stable, Respiratory Function Stable, Patent Airway, No signs of Nausea or vomiting and Pain level controlled  Post-op Vital Signs: Reviewed and stable  Complications: No apparent anesthesia complications

## 2013-04-18 NOTE — Preoperative (Signed)
Beta Blockers   Reason not to administer Beta Blockers:Not Applicable 

## 2013-04-18 NOTE — Transfer of Care (Signed)
Immediate Anesthesia Transfer of Care Note  Patient: Anne Hogan  Procedure(s) Performed: Procedure(s): LAPAROSCOPIC CHOLECYSTECTOMY WITH INTRAOPERATIVE CHOLANGIOGRAM (N/A)  Patient Location: PACU  Anesthesia Type:General  Level of Consciousness: awake, alert  and oriented  Airway & Oxygen Therapy: Patient Spontanous Breathing, Patient connected to nasal cannula oxygen and Patient connected to face mask oxygen  Post-op Assessment: Report given to PACU RN and Post -op Vital signs reviewed and stable  Post vital signs: Reviewed and stable  Complications: No apparent anesthesia complications

## 2013-04-18 NOTE — Op Note (Signed)
Pre Operative Diagnosis:  Biliary colic  Post Operative Diagnosis: same  Procedure: Lap chole with IOC  Surgeon: Dr. Axel Filler  Assistant: none  Anesthesia: GETA  EBL: <5cc  Complications: none  Counts: reported as correct x 2  Findings:  Large cystic duct, multiple small stones within the gallbladder.  On cholagniogram, dilated cystic duct and CBD seen without filling defects.  Indications for procedure:  Pt is a 67 y/o F with RUQ pain and seen to have gallstones.  She was counseled in clinic and decided to have these electively removed.  Details of the procedure:  The patient was taken to the operating and placed in the supine position with bilateral SCDs in place. A time out was called and all facts were verified. A pneumoperitoneum was obtained via A Veress needle technique to a pressure of 14mm of mercury. A 5mm trochar was then placed in the right upper quadrant under visualization, and there were no injuries to any abdominal organs. A 11 mm port was then placed in the umbilical region after infiltrating with local anesthesia under direct visualization. A second epigastric port was placed under direct visualization. The gallbladder was identified and retracted, the peritoneum was then sharply dissected from the gallbladder and this dissection was carried down to Calot's triangle. The cystic duct was identified and stripped away circumferentially and seen going into the gallbladder 360, the critical angle was obtained.  It was noted to be very dilated and large.  A Cook catheter was used to perform an intraoperative cholangiogram. The cystic duct and common bile duct were seen free of filling defects.  2 clips were placed proximally one distally and the cystic duct transected. The cystic artery was identified and 2 clips placed proximally and one distally and transected.  We then proceeded to remove the gallbladder off the hepatic fossa with Bovie cautery. A retrieval bag was then  placed in the abdomen and gallbladder placed in the bag. The hepatic fossa was then reexamined and hemostasis was achieved with Bovie cautery and was excellent at this portion of the case. The subhepatic fossa and perihepatic fossa was then irrigated until the effluent was clear. The 11 mm trocar fascia was reapproximated with the Endo Close #1 Vicryl x2.  The pneumoperitoneum was evacuated and all trochars removed under direct visulalization.  The skin was then closed with 4-0 Monocryl and the skin dressed with Steri-Strips, gauze, and tape.  The patient was awaken from general anesthesia and taken to the recovery room in stable condition.

## 2013-04-18 NOTE — Anesthesia Preprocedure Evaluation (Addendum)
Anesthesia Evaluation  Patient identified by MRN, date of birth, ID band Patient awake    Reviewed: Allergy & Precautions, H&P , NPO status , Patient's Chart, lab work & pertinent test results  Airway Mallampati: II TM Distance: >3 FB Neck ROM: Full    Dental   Pulmonary sleep apnea , former smoker,  breath sounds clear to auscultation        Cardiovascular Rhythm:Regular Rate:Normal     Neuro/Psych  Headaches, Anxiety Depression ADD   GI/Hepatic Neg liver ROS, GERD-  ,  Endo/Other  negative endocrine ROS  Renal/GU negative Renal ROS     Musculoskeletal   Abdominal   Peds  Hematology negative hematology ROS (+)   Anesthesia Other Findings   Reproductive/Obstetrics                          Anesthesia Physical Anesthesia Plan  ASA: II  Anesthesia Plan: General   Post-op Pain Management:    Induction: Intravenous  Airway Management Planned: Oral ETT  Additional Equipment:   Intra-op Plan:   Post-operative Plan: Extubation in OR  Informed Consent: I have reviewed the patients History and Physical, chart, labs and discussed the procedure including the risks, benefits and alternatives for the proposed anesthesia with the patient or authorized representative who has indicated his/her understanding and acceptance.   Dental advisory given  Plan Discussed with: CRNA and Surgeon  Anesthesia Plan Comments:       Anesthesia Quick Evaluation

## 2013-04-18 NOTE — Interval H&P Note (Signed)
History and Physical Interval Note:  04/18/2013 11:14 AM  Anne Hogan  has presented today for surgery, with the diagnosis of gallstones  The various methods of treatment have been discussed with the patient and family. After consideration of risks, benefits and other options for treatment, the patient has consented to  Procedure(s): LAPAROSCOPIC CHOLECYSTECTOMY WITH INTRAOPERATIVE CHOLANGIOGRAM (N/A) as a surgical intervention .  The patient's history has been reviewed, patient examined, no change in status, stable for surgery.  I have reviewed the patient's chart and labs.  Questions were answered to the patient's satisfaction.     Marigene Ehlers., Jed Limerick

## 2013-04-18 NOTE — Progress Notes (Signed)
Bed assignment of [phone] received. Report called to [facility]. Pt transported via bed without incident.

## 2013-04-18 NOTE — Progress Notes (Signed)
Pt reports being concerned about going home with pain level and does not have anyone at home able to help her tonight. Dr. Derrell Lolling paged to report these findings, order received to admit pt to 23 hour obs med surg. Bed requested.

## 2013-04-18 NOTE — H&P (View-Only) (Signed)
Patient ID: Anne Hogan, female   DOB: 18-Sep-1945, 67 y.o.   MRN: [id number]  Chief Complaint  Patient presents with  . New Evaluation    eval gallbladder    HPI Anne Hogan is a 67 y.o. female.  The patient is a 67 year old female who underwent evaluation at [facility] long ED for right upper quadrant pain. Patient underwent a CT scan which revealed gallstones and questionable pericholecystic fluid. Patient's laboratory studies were within normal limits. Patient states that the pain began yesterday and progressed throughout the day. She states that prior to being seen in the ER her pain did slowly resolve. She states she known she has a history of gallstones and has never had any symptoms.  HPI  Past Medical History  Diagnosis Date  . GERD (gastroesophageal reflux disease)     Past Surgical History  Procedure Laterality Date  . Knee arthroscopy    . Tonsillectomy      No family history on file.  Social History History  Substance Use Topics  . Smoking status: Never Smoker   . Smokeless tobacco: Not on file  . Alcohol Use: No    Allergies  Allergen Reactions  . Penicillins Rash  . Codeine Other (See Comments)    Possible sensitivity, patient not sure    Current Outpatient Prescriptions  Medication Sig Dispense Refill  . buPROPion (WELLBUTRIN XL) 300 MG 24 hr tablet Take 300 mg by mouth daily.      [name] escitalopram (LEXAPRO) 10 MG tablet Take 10 mg by mouth daily.      . Vitamin D, Ergocalciferol, (DRISDOL) 50000 UNITS CAPS Take 50,000 Units by mouth 2 (two) times a week.        [name] HYDROcodone-acetaminophen (NORCO/VICODIN) 5-325 MG per tablet Take 1 tablet by mouth every 6 (six) hours as needed for pain.  10 tablet  0  . ondansetron (ZOFRAN-ODT) 4 MG disintegrating tablet Take 1 tablet (4 mg total) by mouth every 8 (eight) hours as needed for nausea.  12 tablet  0   No current facility-administered medications for this visit.    Review of Systems Review of Systems   Constitutional: Negative.   HENT: Negative.   Respiratory: Negative.   Cardiovascular: Negative.   Gastrointestinal: Positive for abdominal pain.  All other systems reviewed and are negative.    Blood pressure 120/70, pulse 67, temperature 96.5 F (35.8 C), temperature source Temporal, resp. rate 14, height 5' 2.5" (1.588 m), weight 167 lb 12.8 oz (76.114 kg), SpO2 97.00%.  Physical Exam Physical Exam  Constitutional: She is oriented to person, place, and time. She appears well-developed and well-nourished.  HENT:  Head: Normocephalic and atraumatic.  Eyes: Conjunctivae and EOM are normal. Pupils are equal, round, and reactive to light.  Neck: Normal range of motion.  Cardiovascular: Normal rate, regular rhythm and normal heart sounds.   Abdominal: Soft. Bowel sounds are normal. There is tenderness (ruq). There is no rebound and no guarding.  Musculoskeletal: Normal range of motion.  Neurological: She is alert and oriented to person, place, and time.    Data Reviewed LFTs within normal limits, CT scan reveals gallstones and pericholecystic fluid.  Assessment    67 year old female with symptomatic cholelithiasis     Plan     1. We'll proceed to the operating room for a laparoscopic cholecystectomy with IOC 2.All risks and benefits were discussed with the patient to generally include: infection, bleeding, possible need for post op ERCP, damage to the bile ducts,  and bile leak. Alternatives were offered and described.  All questions were answered and the patient voiced understanding of the procedure and wishes to proceed at this point with a laparoscopic cholecystectomy         Marigene Ehlers., [facility] 04/05/2013, 2:33 PM

## 2013-04-19 ENCOUNTER — Encounter (HOSPITAL_COMMUNITY): Payer: Self-pay | Admitting: *Deleted

## 2013-04-19 ENCOUNTER — Encounter (HOSPITAL_COMMUNITY): Payer: Self-pay | Admitting: General Practice

## 2013-04-19 NOTE — Discharge Summary (Signed)
Physician Discharge Summary  Patient ID: Anne Hogan MRN: [id number] DOB/AGE: [birth year] 67 y.o.  Admit date: 04/18/2013 Discharge date: 04/19/2013  Admission Diagnoses:s/p lap chole, pain  Discharge Diagnoses:  Active Problems:   * No active hospital problems. *   Discharged Condition: good  Hospital Course: Pt was admitted s/p lap chole.  In the PACU pt had a lot of pain, and was this admitted.  She did well on the floor.  Was tolerating PO well.  She was ambulating without any difficulties.  She was otherwise afebrile, and deemed stable for DC , and Dc'd home.  Consults: None  Significant Diagnostic Studies: none  Treatments: surgery: 04/18/2013  Discharge Exam: Blood pressure 112/59, pulse 52, temperature 98.4 F (36.9 C), temperature source Oral, resp. rate 20, height 5' 2.5" (1.588 m), weight 171 lb 9.6 oz (77.837 kg), SpO2 100.00%. General appearance: alert and cooperative Cardio: regular rate and rhythm, S1, S2 normal, no murmur, click, rub or gallop GI: s/nt/nd/active Bs, wound c/c/di  Disposition: 01-Home or Self Care  Discharge Orders   Future Appointments Provider Department Dept Phone   05/08/2013 11:10 AM Axel Filler, MD [facility], [place] [phone]   Future Orders Complete By Expires   Discharge patient  As directed        Medication List    STOP taking these medications       HYDROcodone-acetaminophen 5-325 MG per tablet  Commonly known as:  NORCO/VICODIN      TAKE these medications       buPROPion 300 MG 24 hr tablet  Commonly known as:  WELLBUTRIN XL  Take 300 mg by mouth daily.     escitalopram 10 MG tablet  Commonly known as:  LEXAPRO  Take 10 mg by mouth daily.     ondansetron 4 MG disintegrating tablet  Commonly known as:  ZOFRAN-ODT  Take 4 mg by mouth every 8 (eight) hours as needed for nausea.     oxyCODONE-acetaminophen 10-325 MG per tablet  Commonly known as:  PERCOCET  Take 1 tablet by mouth every 4 (four)  hours as needed for pain.     Vitamin D (Ergocalciferol) 50000 UNITS Caps capsule  Commonly known as:  DRISDOL  Take 50,000 Units by mouth 2 (two) times a week. Take on Sunday, and Thursday.           Follow-up Information   Follow up with Lajean Saver, MD. Schedule an appointment as soon as possible for a visit in 2 weeks.   Specialty:  General Surgery   Contact information:   [address] [place] [address] [phone]       Signed: Marigene Ehlers., [facility] 04/19/2013, 8:08 AM

## 2013-04-19 NOTE — Progress Notes (Signed)
Pt discharged to home

## 2013-04-21 ENCOUNTER — Encounter (HOSPITAL_COMMUNITY): Payer: Self-pay | Admitting: General Surgery

## 2013-05-08 ENCOUNTER — Encounter (INDEPENDENT_AMBULATORY_CARE_PROVIDER_SITE_OTHER): Payer: Medicare Other | Admitting: General Surgery

## 2013-05-16 ENCOUNTER — Encounter (INDEPENDENT_AMBULATORY_CARE_PROVIDER_SITE_OTHER): Payer: Self-pay | Admitting: General Surgery

## 2013-05-16 ENCOUNTER — Ambulatory Visit (INDEPENDENT_AMBULATORY_CARE_PROVIDER_SITE_OTHER): Payer: Medicare Other | Admitting: General Surgery

## 2013-05-16 VITALS — BP 126/74 | HR 64 | Temp 97.2°F | Resp 14 | Ht 62.5 in | Wt 161.0 lb

## 2013-05-16 DIAGNOSIS — Z9889 Other specified postprocedural states: Secondary | ICD-10-CM

## 2013-05-16 NOTE — Progress Notes (Signed)
Patient ID: Anne Hogan, female   DOB: [birth year], 67 y.o.   MRN: [id number] Post op course The patient is a 67 year old female status post laparoscopic cholecystectomy secondary to gallstone pancreatitis. Patient has been doing well postoperatively. She is on a normal diet. She is having normal bowel function.  On Exam: Wounds are clean dry and intact  Pathology:   Reveals chronic cholecystitis and cholelithiasis.  This was discussed with the patient.  Assessment and Plan 66 year old female status post laparoscopic cholecystectomy 1. Patient follow up as needed 2. We discussed weight lifting restrictions for another month. Patient can continue with aerobic exercise.   Axel Filler, MD [facility] General & Minimally Invasive Surgery Trauma & Emergency Surgery

## 2013-05-25 ENCOUNTER — Other Ambulatory Visit: Payer: Self-pay

## 2013-06-21 DIAGNOSIS — F909 Attention-deficit hyperactivity disorder, unspecified type: Secondary | ICD-10-CM | POA: Diagnosis not present

## 2013-06-21 DIAGNOSIS — Z79899 Other long term (current) drug therapy: Secondary | ICD-10-CM | POA: Diagnosis not present

## 2013-06-21 DIAGNOSIS — M899 Disorder of bone, unspecified: Secondary | ICD-10-CM | POA: Diagnosis not present

## 2013-06-21 DIAGNOSIS — F39 Unspecified mood [affective] disorder: Secondary | ICD-10-CM | POA: Diagnosis not present

## 2013-06-21 DIAGNOSIS — R197 Diarrhea, unspecified: Secondary | ICD-10-CM | POA: Diagnosis not present

## 2013-06-21 DIAGNOSIS — R634 Abnormal weight loss: Secondary | ICD-10-CM | POA: Diagnosis not present

## 2013-06-21 DIAGNOSIS — R109 Unspecified abdominal pain: Secondary | ICD-10-CM | POA: Diagnosis not present

## 2013-06-21 DIAGNOSIS — Z6827 Body mass index (BMI) 27.0-27.9, adult: Secondary | ICD-10-CM | POA: Diagnosis not present

## 2013-06-21 DIAGNOSIS — R82998 Other abnormal findings in urine: Secondary | ICD-10-CM | POA: Diagnosis not present

## 2013-06-21 DIAGNOSIS — Z23 Encounter for immunization: Secondary | ICD-10-CM | POA: Diagnosis not present

## 2013-08-16 DIAGNOSIS — F4320 Adjustment disorder, unspecified: Secondary | ICD-10-CM | POA: Diagnosis not present

## 2013-09-11 DIAGNOSIS — F39 Unspecified mood [affective] disorder: Secondary | ICD-10-CM | POA: Diagnosis not present

## 2013-09-11 DIAGNOSIS — R059 Cough, unspecified: Secondary | ICD-10-CM | POA: Diagnosis not present

## 2013-09-11 DIAGNOSIS — IMO0002 Reserved for concepts with insufficient information to code with codable children: Secondary | ICD-10-CM | POA: Diagnosis not present

## 2013-09-11 DIAGNOSIS — Z6827 Body mass index (BMI) 27.0-27.9, adult: Secondary | ICD-10-CM | POA: Diagnosis not present

## 2013-09-11 DIAGNOSIS — R05 Cough: Secondary | ICD-10-CM | POA: Diagnosis not present

## 2013-09-11 DIAGNOSIS — M1710 Unilateral primary osteoarthritis, unspecified knee: Secondary | ICD-10-CM | POA: Diagnosis not present

## 2013-09-11 DIAGNOSIS — K219 Gastro-esophageal reflux disease without esophagitis: Secondary | ICD-10-CM | POA: Diagnosis not present

## 2013-09-11 DIAGNOSIS — M542 Cervicalgia: Secondary | ICD-10-CM | POA: Diagnosis not present

## 2013-09-11 DIAGNOSIS — F430 Acute stress reaction: Secondary | ICD-10-CM | POA: Diagnosis not present

## 2013-12-27 DIAGNOSIS — Z6827 Body mass index (BMI) 27.0-27.9, adult: Secondary | ICD-10-CM | POA: Diagnosis not present

## 2013-12-27 DIAGNOSIS — K219 Gastro-esophageal reflux disease without esophagitis: Secondary | ICD-10-CM | POA: Diagnosis not present

## 2013-12-27 DIAGNOSIS — I872 Venous insufficiency (chronic) (peripheral): Secondary | ICD-10-CM | POA: Diagnosis not present

## 2013-12-27 DIAGNOSIS — G4733 Obstructive sleep apnea (adult) (pediatric): Secondary | ICD-10-CM | POA: Diagnosis not present

## 2013-12-27 DIAGNOSIS — F909 Attention-deficit hyperactivity disorder, unspecified type: Secondary | ICD-10-CM | POA: Diagnosis not present

## 2013-12-27 DIAGNOSIS — R5381 Other malaise: Secondary | ICD-10-CM | POA: Diagnosis not present

## 2013-12-27 DIAGNOSIS — R7301 Impaired fasting glucose: Secondary | ICD-10-CM | POA: Diagnosis not present

## 2013-12-27 DIAGNOSIS — M949 Disorder of cartilage, unspecified: Secondary | ICD-10-CM | POA: Diagnosis not present

## 2013-12-27 DIAGNOSIS — M899 Disorder of bone, unspecified: Secondary | ICD-10-CM | POA: Diagnosis not present

## 2013-12-27 DIAGNOSIS — R5383 Other fatigue: Secondary | ICD-10-CM | POA: Diagnosis not present

## 2014-01-05 DIAGNOSIS — Z01419 Encounter for gynecological examination (general) (routine) without abnormal findings: Secondary | ICD-10-CM | POA: Diagnosis not present

## 2014-01-05 DIAGNOSIS — R350 Frequency of micturition: Secondary | ICD-10-CM | POA: Diagnosis not present

## 2014-01-05 DIAGNOSIS — N393 Stress incontinence (female) (male): Secondary | ICD-10-CM | POA: Diagnosis not present

## 2014-01-05 DIAGNOSIS — Z124 Encounter for screening for malignant neoplasm of cervix: Secondary | ICD-10-CM | POA: Diagnosis not present

## 2014-01-15 DIAGNOSIS — K529 Noninfective gastroenteritis and colitis, unspecified: Secondary | ICD-10-CM | POA: Diagnosis not present

## 2014-01-15 DIAGNOSIS — Z86010 Personal history of colonic polyps: Secondary | ICD-10-CM | POA: Diagnosis not present

## 2014-01-26 ENCOUNTER — Other Ambulatory Visit: Payer: Self-pay | Admitting: Gastroenterology

## 2014-01-26 DIAGNOSIS — Z09 Encounter for follow-up examination after completed treatment for conditions other than malignant neoplasm: Secondary | ICD-10-CM | POA: Diagnosis not present

## 2014-01-26 DIAGNOSIS — Z86010 Personal history of colonic polyps: Secondary | ICD-10-CM | POA: Diagnosis not present

## 2014-01-26 DIAGNOSIS — D126 Benign neoplasm of colon, unspecified: Secondary | ICD-10-CM | POA: Diagnosis not present

## 2014-01-26 DIAGNOSIS — K5730 Diverticulosis of large intestine without perforation or abscess without bleeding: Secondary | ICD-10-CM | POA: Diagnosis not present

## 2014-02-21 DIAGNOSIS — F4320 Adjustment disorder, unspecified: Secondary | ICD-10-CM | POA: Diagnosis not present

## 2014-03-02 DIAGNOSIS — H2510 Age-related nuclear cataract, unspecified eye: Secondary | ICD-10-CM | POA: Diagnosis not present

## 2014-03-05 DIAGNOSIS — F4320 Adjustment disorder, unspecified: Secondary | ICD-10-CM | POA: Diagnosis not present

## 2014-03-16 DIAGNOSIS — F4320 Adjustment disorder, unspecified: Secondary | ICD-10-CM | POA: Diagnosis not present

## 2014-03-16 DIAGNOSIS — H2510 Age-related nuclear cataract, unspecified eye: Secondary | ICD-10-CM | POA: Diagnosis not present

## 2014-04-09 DIAGNOSIS — H2510 Age-related nuclear cataract, unspecified eye: Secondary | ICD-10-CM | POA: Diagnosis not present

## 2014-04-09 DIAGNOSIS — H5210 Myopia, unspecified eye: Secondary | ICD-10-CM | POA: Diagnosis not present

## 2014-04-17 DIAGNOSIS — H2510 Age-related nuclear cataract, unspecified eye: Secondary | ICD-10-CM | POA: Diagnosis not present

## 2014-04-23 DIAGNOSIS — H2512 Age-related nuclear cataract, left eye: Secondary | ICD-10-CM | POA: Diagnosis not present

## 2014-04-23 DIAGNOSIS — H5702 Anisocoria: Secondary | ICD-10-CM | POA: Diagnosis not present

## 2014-04-23 DIAGNOSIS — H5231 Anisometropia: Secondary | ICD-10-CM | POA: Diagnosis not present

## 2014-04-23 DIAGNOSIS — H5212 Myopia, left eye: Secondary | ICD-10-CM | POA: Diagnosis not present

## 2014-05-07 DIAGNOSIS — F909 Attention-deficit hyperactivity disorder, unspecified type: Secondary | ICD-10-CM | POA: Diagnosis not present

## 2014-05-07 DIAGNOSIS — Z23 Encounter for immunization: Secondary | ICD-10-CM | POA: Diagnosis not present

## 2014-05-07 DIAGNOSIS — Z283 Underimmunization status: Secondary | ICD-10-CM | POA: Diagnosis not present

## 2014-05-07 DIAGNOSIS — F39 Unspecified mood [affective] disorder: Secondary | ICD-10-CM | POA: Diagnosis not present

## 2014-05-07 DIAGNOSIS — Z6828 Body mass index (BMI) 28.0-28.9, adult: Secondary | ICD-10-CM | POA: Diagnosis not present

## 2014-06-08 DIAGNOSIS — M8588 Other specified disorders of bone density and structure, other site: Secondary | ICD-10-CM | POA: Diagnosis not present

## 2014-06-08 DIAGNOSIS — F39 Unspecified mood [affective] disorder: Secondary | ICD-10-CM | POA: Diagnosis not present

## 2014-06-08 DIAGNOSIS — K219 Gastro-esophageal reflux disease without esophagitis: Secondary | ICD-10-CM | POA: Diagnosis not present

## 2014-06-08 DIAGNOSIS — G4733 Obstructive sleep apnea (adult) (pediatric): Secondary | ICD-10-CM | POA: Diagnosis not present

## 2014-06-08 DIAGNOSIS — R7301 Impaired fasting glucose: Secondary | ICD-10-CM | POA: Diagnosis not present

## 2014-06-08 DIAGNOSIS — Z6828 Body mass index (BMI) 28.0-28.9, adult: Secondary | ICD-10-CM | POA: Diagnosis not present

## 2014-06-08 DIAGNOSIS — J302 Other seasonal allergic rhinitis: Secondary | ICD-10-CM | POA: Diagnosis not present

## 2014-07-06 DIAGNOSIS — F4320 Adjustment disorder, unspecified: Secondary | ICD-10-CM | POA: Diagnosis not present

## 2014-07-24 DIAGNOSIS — G4733 Obstructive sleep apnea (adult) (pediatric): Secondary | ICD-10-CM | POA: Diagnosis not present

## 2014-07-24 DIAGNOSIS — Z6828 Body mass index (BMI) 28.0-28.9, adult: Secondary | ICD-10-CM | POA: Diagnosis not present

## 2014-07-24 DIAGNOSIS — M545 Low back pain: Secondary | ICD-10-CM | POA: Diagnosis not present

## 2014-07-24 DIAGNOSIS — J0100 Acute maxillary sinusitis, unspecified: Secondary | ICD-10-CM | POA: Diagnosis not present

## 2014-07-24 DIAGNOSIS — R05 Cough: Secondary | ICD-10-CM | POA: Diagnosis not present

## 2014-09-10 DIAGNOSIS — M545 Low back pain: Secondary | ICD-10-CM | POA: Diagnosis not present

## 2014-09-10 DIAGNOSIS — M5136 Other intervertebral disc degeneration, lumbar region: Secondary | ICD-10-CM | POA: Diagnosis not present

## 2014-09-10 DIAGNOSIS — M533 Sacrococcygeal disorders, not elsewhere classified: Secondary | ICD-10-CM | POA: Diagnosis not present

## 2014-09-10 DIAGNOSIS — M1990 Unspecified osteoarthritis, unspecified site: Secondary | ICD-10-CM | POA: Diagnosis not present

## 2014-09-10 DIAGNOSIS — Z6828 Body mass index (BMI) 28.0-28.9, adult: Secondary | ICD-10-CM | POA: Diagnosis not present

## 2014-09-18 DIAGNOSIS — H26493 Other secondary cataract, bilateral: Secondary | ICD-10-CM | POA: Diagnosis not present

## 2014-12-14 ENCOUNTER — Other Ambulatory Visit: Payer: Self-pay

## 2014-12-14 DIAGNOSIS — Z1231 Encounter for screening mammogram for malignant neoplasm of breast: Secondary | ICD-10-CM

## 2014-12-24 DIAGNOSIS — R7301 Impaired fasting glucose: Secondary | ICD-10-CM | POA: Diagnosis not present

## 2014-12-24 DIAGNOSIS — R8299 Other abnormal findings in urine: Secondary | ICD-10-CM | POA: Diagnosis not present

## 2014-12-24 DIAGNOSIS — R238 Other skin changes: Secondary | ICD-10-CM | POA: Diagnosis not present

## 2014-12-24 DIAGNOSIS — R252 Cramp and spasm: Secondary | ICD-10-CM | POA: Diagnosis not present

## 2014-12-24 DIAGNOSIS — M859 Disorder of bone density and structure, unspecified: Secondary | ICD-10-CM | POA: Diagnosis not present

## 2014-12-24 DIAGNOSIS — K219 Gastro-esophageal reflux disease without esophagitis: Secondary | ICD-10-CM | POA: Diagnosis not present

## 2014-12-24 DIAGNOSIS — Z6829 Body mass index (BMI) 29.0-29.9, adult: Secondary | ICD-10-CM | POA: Diagnosis not present

## 2014-12-24 DIAGNOSIS — F39 Unspecified mood [affective] disorder: Secondary | ICD-10-CM | POA: Diagnosis not present

## 2014-12-24 DIAGNOSIS — Z1389 Encounter for screening for other disorder: Secondary | ICD-10-CM | POA: Diagnosis not present

## 2015-01-11 ENCOUNTER — Ambulatory Visit: Payer: Medicare Other

## 2015-01-14 ENCOUNTER — Other Ambulatory Visit: Payer: Self-pay

## 2015-02-12 ENCOUNTER — Ambulatory Visit
Admission: RE | Admit: 2015-02-12 | Discharge: 2015-02-12 | Disposition: A | Discharge status: Home / Self Care | Payer: Medicare Other | Source: Ambulatory Visit

## 2015-02-12 DIAGNOSIS — Z1231 Encounter for screening mammogram for malignant neoplasm of breast: Secondary | ICD-10-CM

## 2015-02-25 DIAGNOSIS — G4733 Obstructive sleep apnea (adult) (pediatric): Secondary | ICD-10-CM | POA: Diagnosis not present

## 2015-02-25 DIAGNOSIS — F430 Acute stress reaction: Secondary | ICD-10-CM | POA: Diagnosis not present

## 2015-02-25 DIAGNOSIS — G4700 Insomnia, unspecified: Secondary | ICD-10-CM | POA: Diagnosis not present

## 2015-02-25 DIAGNOSIS — Z6829 Body mass index (BMI) 29.0-29.9, adult: Secondary | ICD-10-CM | POA: Diagnosis not present

## 2015-02-25 DIAGNOSIS — R5383 Other fatigue: Secondary | ICD-10-CM | POA: Diagnosis not present

## 2015-02-25 DIAGNOSIS — R8290 Unspecified abnormal findings in urine: Secondary | ICD-10-CM | POA: Diagnosis not present

## 2015-02-25 DIAGNOSIS — F39 Unspecified mood [affective] disorder: Secondary | ICD-10-CM | POA: Diagnosis not present

## 2015-02-25 DIAGNOSIS — R002 Palpitations: Secondary | ICD-10-CM | POA: Diagnosis not present

## 2015-05-20 DIAGNOSIS — Z23 Encounter for immunization: Secondary | ICD-10-CM | POA: Diagnosis not present

## 2015-06-11 DIAGNOSIS — L821 Other seborrheic keratosis: Secondary | ICD-10-CM | POA: Diagnosis not present

## 2015-06-20 DIAGNOSIS — R079 Chest pain, unspecified: Secondary | ICD-10-CM | POA: Diagnosis not present

## 2015-06-24 DIAGNOSIS — M533 Sacrococcygeal disorders, not elsewhere classified: Secondary | ICD-10-CM | POA: Diagnosis not present

## 2015-06-24 DIAGNOSIS — R079 Chest pain, unspecified: Secondary | ICD-10-CM | POA: Diagnosis not present

## 2015-06-24 DIAGNOSIS — F39 Unspecified mood [affective] disorder: Secondary | ICD-10-CM | POA: Diagnosis not present

## 2015-06-24 DIAGNOSIS — K219 Gastro-esophageal reflux disease without esophagitis: Secondary | ICD-10-CM | POA: Diagnosis not present

## 2015-06-24 DIAGNOSIS — R7301 Impaired fasting glucose: Secondary | ICD-10-CM | POA: Diagnosis not present

## 2015-06-24 DIAGNOSIS — Z6829 Body mass index (BMI) 29.0-29.9, adult: Secondary | ICD-10-CM | POA: Diagnosis not present

## 2015-07-10 DIAGNOSIS — J029 Acute pharyngitis, unspecified: Secondary | ICD-10-CM | POA: Diagnosis not present

## 2015-07-10 DIAGNOSIS — M791 Myalgia: Secondary | ICD-10-CM | POA: Diagnosis not present

## 2015-07-10 DIAGNOSIS — Z6829 Body mass index (BMI) 29.0-29.9, adult: Secondary | ICD-10-CM | POA: Diagnosis not present

## 2015-07-10 DIAGNOSIS — J019 Acute sinusitis, unspecified: Secondary | ICD-10-CM | POA: Diagnosis not present

## 2015-10-14 ENCOUNTER — Other Ambulatory Visit: Payer: Self-pay | Admitting: Internal Medicine

## 2015-10-14 ENCOUNTER — Encounter: Payer: Self-pay | Admitting: Cardiology

## 2015-10-14 DIAGNOSIS — R002 Palpitations: Secondary | ICD-10-CM

## 2015-10-14 DIAGNOSIS — R5383 Other fatigue: Secondary | ICD-10-CM | POA: Diagnosis not present

## 2015-10-14 DIAGNOSIS — Z6829 Body mass index (BMI) 29.0-29.9, adult: Secondary | ICD-10-CM | POA: Diagnosis not present

## 2015-10-14 DIAGNOSIS — I951 Orthostatic hypotension: Secondary | ICD-10-CM | POA: Diagnosis not present

## 2015-10-14 DIAGNOSIS — R269 Unspecified abnormalities of gait and mobility: Secondary | ICD-10-CM

## 2015-10-16 ENCOUNTER — Ambulatory Visit (HOSPITAL_COMMUNITY)
Admission: RE | Admit: 2015-10-16 | Discharge: 2015-10-16 | Disposition: A | Discharge status: Home / Self Care | Payer: PPO | Source: Ambulatory Visit | Attending: Vascular Surgery | Admitting: Vascular Surgery

## 2015-10-16 ENCOUNTER — Other Ambulatory Visit (HOSPITAL_COMMUNITY): Payer: Self-pay | Admitting: Family Medicine

## 2015-10-16 DIAGNOSIS — R269 Unspecified abnormalities of gait and mobility: Secondary | ICD-10-CM

## 2015-10-16 DIAGNOSIS — F329 Major depressive disorder, single episode, unspecified: Secondary | ICD-10-CM | POA: Insufficient documentation

## 2015-10-16 DIAGNOSIS — R002 Palpitations: Secondary | ICD-10-CM

## 2015-10-16 DIAGNOSIS — G4730 Sleep apnea, unspecified: Secondary | ICD-10-CM | POA: Insufficient documentation

## 2015-10-16 DIAGNOSIS — K219 Gastro-esophageal reflux disease without esophagitis: Secondary | ICD-10-CM | POA: Insufficient documentation

## 2015-10-18 DIAGNOSIS — H2513 Age-related nuclear cataract, bilateral: Secondary | ICD-10-CM | POA: Diagnosis not present

## 2015-10-22 ENCOUNTER — Other Ambulatory Visit (HOSPITAL_COMMUNITY): Payer: Self-pay | Admitting: Internal Medicine

## 2015-10-22 DIAGNOSIS — R269 Unspecified abnormalities of gait and mobility: Secondary | ICD-10-CM

## 2015-10-22 DIAGNOSIS — R002 Palpitations: Secondary | ICD-10-CM

## 2015-10-23 ENCOUNTER — Ambulatory Visit
Admission: RE | Admit: 2015-10-23 | Discharge: 2015-10-23 | Disposition: A | Discharge status: Home / Self Care | Payer: PPO | Source: Ambulatory Visit | Attending: Internal Medicine | Admitting: Internal Medicine

## 2015-10-23 DIAGNOSIS — R269 Unspecified abnormalities of gait and mobility: Secondary | ICD-10-CM

## 2015-10-23 DIAGNOSIS — R002 Palpitations: Secondary | ICD-10-CM

## 2015-10-23 DIAGNOSIS — R2689 Other abnormalities of gait and mobility: Secondary | ICD-10-CM | POA: Diagnosis not present

## 2015-10-28 ENCOUNTER — Other Ambulatory Visit: Payer: Self-pay

## 2015-10-28 ENCOUNTER — Ambulatory Visit (HOSPITAL_COMMUNITY): Payer: PPO | Attending: Cardiovascular Disease

## 2015-10-28 DIAGNOSIS — I351 Nonrheumatic aortic (valve) insufficiency: Secondary | ICD-10-CM | POA: Insufficient documentation

## 2015-10-28 DIAGNOSIS — R269 Unspecified abnormalities of gait and mobility: Secondary | ICD-10-CM | POA: Diagnosis not present

## 2015-10-28 DIAGNOSIS — I517 Cardiomegaly: Secondary | ICD-10-CM | POA: Diagnosis not present

## 2015-10-28 DIAGNOSIS — Z87891 Personal history of nicotine dependence: Secondary | ICD-10-CM | POA: Diagnosis not present

## 2015-10-28 DIAGNOSIS — R002 Palpitations: Secondary | ICD-10-CM | POA: Insufficient documentation

## 2015-10-28 DIAGNOSIS — Z8249 Family history of ischemic heart disease and other diseases of the circulatory system: Secondary | ICD-10-CM | POA: Insufficient documentation

## 2015-10-28 DIAGNOSIS — I341 Nonrheumatic mitral (valve) prolapse: Secondary | ICD-10-CM | POA: Insufficient documentation

## 2015-10-28 DIAGNOSIS — I340 Nonrheumatic mitral (valve) insufficiency: Secondary | ICD-10-CM | POA: Insufficient documentation

## 2015-11-13 ENCOUNTER — Encounter: Payer: Self-pay | Admitting: Cardiology

## 2015-11-13 ENCOUNTER — Ambulatory Visit (INDEPENDENT_AMBULATORY_CARE_PROVIDER_SITE_OTHER): Payer: PPO | Admitting: Cardiology

## 2015-11-13 VITALS — BP 128/70 | HR 73 | Ht 62.0 in | Wt 168.0 lb

## 2015-11-13 DIAGNOSIS — R002 Palpitations: Secondary | ICD-10-CM

## 2015-11-13 NOTE — Patient Instructions (Signed)
Medication Instructions:  Your physician recommends that you continue on your current medications as directed. Please refer to the Current Medication list given to you today.   Labwork: None ordered   Testing/Procedures: Your physician has recommended that you wear an event monitor. Event monitors are medical devices that record the heart's electrical activity. Doctors most often [place] these monitors to diagnose arrhythmias. Arrhythmias are problems with the speed or rhythm of the heartbeat. The monitor is a small, portable device. You can wear one while you do your normal daily activities. This is usually used to diagnose what is causing palpitations/syncope (passing out).    Follow-Up: Your physician wants you to follow-up in: 6 months with Dr Cora Daniels will receive a reminder letter in the mail two months in advance. If you don't receive a letter, please call our office to schedule the follow-up appointment.   Any Other Special Instructions Will Be Listed Below (If Applicable).     If you need a refill on your cardiac medications before your next appointment, please call your pharmacy.

## 2015-11-13 NOTE — Progress Notes (Signed)
Cardiology Office Note   Date:  11/13/2015   ID:  Anne Hogan, DOB 1946/04/04, MRN [id number]  PCP:  Anne Ringer, MD  Cardiologist:  Anne Haw, MD    Chief Complaint  Patient presents with  . New Patient (Initial Visit)  . Chest Pain  . Palpitations     History of Present Illness: Anne Hogan is a 70 y.o. female who presents today for cardiology evaluation.   She was seen by her primary physician on April 13 with a concern for a prolonged respiratory illness. She took Tamiflu and a Z-Pak. She had improved but was tired and noticed that while the grocery store when she moved to get out of someone's way she started walking in an attempt to go to the right she started walking to the left, made several steps and couldn't get her back on same path. She has had a few episodes of doing the same thing most recently. The episode was not associated with palpitations, dizziness, changes in vision, headaches, chest pain, shortness of breath, nausea, vomiting, neck or jaw pain, chest pain, or neurologic symptoms. She had an echocardiogram which showed moderate to severe mitral regurgitation with mitral valve prolapse. She had an MRI of her brain that showed no acute abnormalities.   Today, she denies symptoms of palpitations, chest pain, shortness of breath, orthopnea, PND, lower extremity edema, claudication, dizziness, presyncope, syncope, bleeding, or neurologic sequela. The patient is tolerating medications without difficulties and is otherwise without complaint today.    Past Medical History  Diagnosis Date  . GERD (gastroesophageal reflux disease)   . Depression   . ADD (attention deficit disorder)   . Claustrophobia   . Squamous carcinoma ([place]) ?1999    "upper left groin" (04/19/2013)  . Sleep apnea     pt uses mouth guard but does not wear machine d/t claustrophobia  . Migraine     "bad when I was in 5th or 6th grade; I can usually ward them off by resting my head  now" (04/19/2013)  . Arthritis     "right knee" (04/19/2013)   Past Surgical History  Procedure Laterality Date  . Varicose vein surgery Right 2000's  . Colonoscopy w/ polypectomy    . Retinal tear repair cryotherapy Left ~ 1997  . Knee arthroscopy Right 12/2007    "meniscus repair" (04/19/2013)  . Tonsillectomy  ~ 1952  . Cholecystectomy  04/18/2013  . Wrist surgery Right 1975    "had a knot taken off" (04/19/2013)  . Cholecystectomy N/A 04/18/2013    Procedure: LAPAROSCOPIC CHOLECYSTECTOMY WITH INTRAOPERATIVE CHOLANGIOGRAM;  Surgeon: Ralene Ok, MD;  Location: [place];  Service: General;  Laterality: N/A;     Current Outpatient Prescriptions  Medication Sig Dispense Refill  . buPROPion (WELLBUTRIN XL) 300 MG 24 hr tablet Take 300 mg by mouth daily.    [name] escitalopram (LEXAPRO) 10 MG tablet Take 10 mg by mouth daily.    . Vitamin D, Ergocalciferol, (DRISDOL) 50000 UNITS CAPS Take 50,000 Units by mouth 2 (two) times a week. Take on Sunday, and Thursday.     No current facility-administered medications for this visit.    Allergies:   Penicillins and Codeine   Social History:  The patient  reports that she quit smoking about 28 years ago. Her smoking use included Cigarettes. She has a 30 pack-year smoking history. She has never used smokeless tobacco. She reports that she drinks alcohol. She reports that she does not use illicit drugs.  Family History:  The patient's family history includes Congestive Heart Failure in her father; Diabetes Mellitus II in her brother and father; Heart attack in her father; Hypertension in her mother; Osteoporosis in her mother; Sarcoidosis in her mother; Tremor in her mother.    ROS:  Please see the history of present illness.   Otherwise, review of systems is positive for none.   All other systems are reviewed and negative.    PHYSICAL EXAM: VS:  There were no vitals taken for this visit. , BMI There is no weight on file to calculate BMI. GEN: Well  nourished, well developed, in no acute distress HEENT: normal Neck: no JVD, carotid bruits, or masses Cardiac: RRR; no murmurs, rubs, or gallops,no edema  Respiratory:  clear to auscultation bilaterally, normal work of breathing GI: soft, nontender, nondistended, + BS MS: no deformity or atrophy Skin: warm and dry Neuro:  Strength and sensation are intact Psych: euthymic mood, full affect  EKG:  EKG is ordered today. The ekg ordered today shows sinus rhythm rate 73  Recent Labs: No results found for requested labs within last 365 days.    Lipid Panel  No results found for: CHOL, TRIG, HDL, CHOLHDL, VLDL, LDLCALC, LDLDIRECT   Wt Readings from Last 3 Encounters:  05/16/13 161 lb (73.029 kg)  04/18/13 171 lb 9.6 oz (77.837 kg)  04/13/13 165 lb 4.8 oz (74.98 kg)      Other studies Reviewed: Additional studies/ records that were reviewed today include: TTE 10/28/15  - Left ventricle: The cavity size was normal. Wall thickness was  normal. Systolic function was normal. The estimated ejection  fraction was in the range of 60% to 65%. Wall motion was normal;  there were no regional wall motion abnormalities. Features are  consistent with a pseudonormal left ventricular filling pattern,  with concomitant abnormal relaxation and increased filling  pressure (grade 2 diastolic dysfunction). Longitudinal strain,  TDI: -18 %. - Aortic valve: There was mild regurgitation. - Mitral valve: Mild prolapse, involving the anterior leaflet and  the posterior leaflet. There was moderate to severe  regurgitation. - Left atrium: The atrium was mildly dilated.  Carotid artery duplex 10/16/15 No evidence of carotid artery stenosis bilateral  ASSESSMENT AND PLAN:  1.  Mitral valve regurgitation: Likely caused by her mitral valve prolapse. She is currently not having symptoms of shortness of breath. She is fatigued, but she has been not sleeping well as she is taking care of her sick  husband. We Manuel Dall continue to follow her mitral valve. I have told her that if she does get short of breath or have further symptoms that she should call the office.  2. Palpitations: Is unclear what the cause of her palpitations are. Could possibly be related to her mitral valve disease, with a left atrial tachycardia or atrial fibrillation. We'll get a monitor to further delineate.    Current medicines are reviewed at length with the patient today.   The patient does not have concerns regarding her medicines.  The following changes were made today:  none  Labs/ tests ordered today include:  No orders of the defined types were placed in this encounter.     Disposition:   FU with Merrit Waugh 6 months  Signed, Izik Bingman Meredith Leeds, MD  11/13/2015 11:34 AM     [facility] [address] [place] [address] [phone] (office) [phone] (fax) Leola Brazil shows labs were also if she would if she would rather for  transfer here to fine and she wants to continue care with him then eventually up to her 6

## 2015-11-15 DIAGNOSIS — R5383 Other fatigue: Secondary | ICD-10-CM | POA: Diagnosis not present

## 2015-11-15 DIAGNOSIS — Z01419 Encounter for gynecological examination (general) (routine) without abnormal findings: Secondary | ICD-10-CM | POA: Diagnosis not present

## 2015-11-15 DIAGNOSIS — Z6829 Body mass index (BMI) 29.0-29.9, adult: Secondary | ICD-10-CM | POA: Diagnosis not present

## 2015-11-15 DIAGNOSIS — K219 Gastro-esophageal reflux disease without esophagitis: Secondary | ICD-10-CM | POA: Diagnosis not present

## 2015-11-18 DIAGNOSIS — Z1231 Encounter for screening mammogram for malignant neoplasm of breast: Secondary | ICD-10-CM | POA: Diagnosis not present

## 2015-11-25 ENCOUNTER — Ambulatory Visit (INDEPENDENT_AMBULATORY_CARE_PROVIDER_SITE_OTHER): Payer: PPO

## 2015-11-25 DIAGNOSIS — R002 Palpitations: Secondary | ICD-10-CM | POA: Diagnosis not present

## 2015-12-12 ENCOUNTER — Telehealth: Payer: Self-pay | Admitting: *Deleted

## 2015-12-12 ENCOUNTER — Telehealth: Payer: Self-pay | Admitting: Cardiology

## 2015-12-12 NOTE — Telephone Encounter (Signed)
Routing to monitor department to address with patient.

## 2015-12-12 NOTE — Telephone Encounter (Signed)
New Message:  Pt called in stating that she is having some irritation in the area of where the electrodes are being placed. Please f/u with her to see can be done to assist with this.

## 2015-12-12 NOTE — Telephone Encounter (Signed)
Friday, 12/13/15, patient will pick up a package of Soft-e electrodes, which, will be left at the check in desk with her name on it.  These electrodes do not have adhesive and may prove less irritating.

## 2016-01-01 ENCOUNTER — Telehealth: Payer: Self-pay | Admitting: *Deleted

## 2016-01-01 NOTE — Telephone Encounter (Signed)
Reviewed monitor results with pt and she verbalized understanding. Pt states that she turned monitor in late last week and over the weekend she noticed that her palpitations were occuring more often and for a longer period of time. Pt unsure of how long and how often they are occuring though. Advised pt to record when she is having palpitations and how long they are lasting and call us on Friday with this information so Dr. Curt Bears can make recommendations. Pt verbalized understanding and was in agreement with this plan.

## 2016-01-01 NOTE — Telephone Encounter (Signed)
-----   Message from Will Meredith Leeds, MD sent at 12/31/2015  7:37 AM EDT ----- No evidence of arrhythmia on monitor.

## 2016-02-10 DIAGNOSIS — R7301 Impaired fasting glucose: Secondary | ICD-10-CM | POA: Diagnosis not present

## 2016-02-10 DIAGNOSIS — M859 Disorder of bone density and structure, unspecified: Secondary | ICD-10-CM | POA: Diagnosis not present

## 2016-02-10 DIAGNOSIS — E784 Other hyperlipidemia: Secondary | ICD-10-CM | POA: Diagnosis not present

## 2016-02-10 DIAGNOSIS — N390 Urinary tract infection, site not specified: Secondary | ICD-10-CM | POA: Diagnosis not present

## 2016-02-10 DIAGNOSIS — R8299 Other abnormal findings in urine: Secondary | ICD-10-CM | POA: Diagnosis not present

## 2016-02-17 DIAGNOSIS — Z23 Encounter for immunization: Secondary | ICD-10-CM | POA: Diagnosis not present

## 2016-02-17 DIAGNOSIS — M8580 Other specified disorders of bone density and structure, unspecified site: Secondary | ICD-10-CM | POA: Diagnosis not present

## 2016-02-17 DIAGNOSIS — Z1389 Encounter for screening for other disorder: Secondary | ICD-10-CM | POA: Diagnosis not present

## 2016-02-17 DIAGNOSIS — N390 Urinary tract infection, site not specified: Secondary | ICD-10-CM | POA: Diagnosis not present

## 2016-02-17 DIAGNOSIS — R7301 Impaired fasting glucose: Secondary | ICD-10-CM | POA: Diagnosis not present

## 2016-02-17 DIAGNOSIS — K219 Gastro-esophageal reflux disease without esophagitis: Secondary | ICD-10-CM | POA: Diagnosis not present

## 2016-02-17 DIAGNOSIS — J302 Other seasonal allergic rhinitis: Secondary | ICD-10-CM | POA: Diagnosis not present

## 2016-02-17 DIAGNOSIS — F39 Unspecified mood [affective] disorder: Secondary | ICD-10-CM | POA: Diagnosis not present

## 2016-02-17 DIAGNOSIS — M179 Osteoarthritis of knee, unspecified: Secondary | ICD-10-CM | POA: Diagnosis not present

## 2016-02-17 DIAGNOSIS — G4733 Obstructive sleep apnea (adult) (pediatric): Secondary | ICD-10-CM | POA: Diagnosis not present

## 2016-02-17 DIAGNOSIS — Z0000 Encounter for general adult medical examination without abnormal findings: Secondary | ICD-10-CM | POA: Diagnosis not present

## 2016-02-17 DIAGNOSIS — I341 Nonrheumatic mitral (valve) prolapse: Secondary | ICD-10-CM | POA: Diagnosis not present

## 2016-04-10 DIAGNOSIS — M62830 Muscle spasm of back: Secondary | ICD-10-CM | POA: Diagnosis not present

## 2016-04-10 DIAGNOSIS — Z6829 Body mass index (BMI) 29.0-29.9, adult: Secondary | ICD-10-CM | POA: Diagnosis not present

## 2016-05-20 DIAGNOSIS — S82141A Displaced bicondylar fracture of right tibia, initial encounter for closed fracture: Secondary | ICD-10-CM

## 2016-05-20 HISTORY — DX: Displaced bicondylar fracture of right tibia, initial encounter for closed fracture: S82.141A

## 2016-06-17 ENCOUNTER — Emergency Department (HOSPITAL_COMMUNITY): Payer: PPO

## 2016-06-17 ENCOUNTER — Emergency Department (HOSPITAL_COMMUNITY)
Admission: EM | Admit: 2016-06-17 | Discharge: 2016-06-17 | Disposition: A | Discharge status: Home / Self Care | Payer: PPO | Attending: Emergency Medicine | Admitting: Emergency Medicine

## 2016-06-17 ENCOUNTER — Encounter (HOSPITAL_COMMUNITY): Payer: Self-pay | Admitting: Emergency Medicine

## 2016-06-17 DIAGNOSIS — T148XXA Other injury of unspecified body region, initial encounter: Secondary | ICD-10-CM | POA: Diagnosis not present

## 2016-06-17 DIAGNOSIS — Z79899 Other long term (current) drug therapy: Secondary | ICD-10-CM | POA: Diagnosis not present

## 2016-06-17 DIAGNOSIS — F909 Attention-deficit hyperactivity disorder, unspecified type: Secondary | ICD-10-CM | POA: Diagnosis not present

## 2016-06-17 DIAGNOSIS — Z23 Encounter for immunization: Secondary | ICD-10-CM | POA: Diagnosis not present

## 2016-06-17 DIAGNOSIS — M25561 Pain in right knee: Secondary | ICD-10-CM | POA: Diagnosis not present

## 2016-06-17 DIAGNOSIS — S82141D Displaced bicondylar fracture of right tibia, subsequent encounter for closed fracture with routine healing: Secondary | ICD-10-CM | POA: Diagnosis not present

## 2016-06-17 DIAGNOSIS — M25551 Pain in right hip: Secondary | ICD-10-CM | POA: Diagnosis not present

## 2016-06-17 DIAGNOSIS — Z87891 Personal history of nicotine dependence: Secondary | ICD-10-CM | POA: Insufficient documentation

## 2016-06-17 DIAGNOSIS — Y999 Unspecified external cause status: Secondary | ICD-10-CM | POA: Insufficient documentation

## 2016-06-17 DIAGNOSIS — Y939 Activity, unspecified: Secondary | ICD-10-CM | POA: Diagnosis not present

## 2016-06-17 DIAGNOSIS — Y929 Unspecified place or not applicable: Secondary | ICD-10-CM | POA: Diagnosis not present

## 2016-06-17 DIAGNOSIS — S8991XA Unspecified injury of right lower leg, initial encounter: Secondary | ICD-10-CM | POA: Diagnosis not present

## 2016-06-17 DIAGNOSIS — W109XXA Fall (on) (from) unspecified stairs and steps, initial encounter: Secondary | ICD-10-CM | POA: Insufficient documentation

## 2016-06-17 DIAGNOSIS — S82141A Displaced bicondylar fracture of right tibia, initial encounter for closed fracture: Secondary | ICD-10-CM | POA: Insufficient documentation

## 2016-06-17 DIAGNOSIS — W19XXXA Unspecified fall, initial encounter: Secondary | ICD-10-CM

## 2016-06-17 DIAGNOSIS — S79911A Unspecified injury of right hip, initial encounter: Secondary | ICD-10-CM | POA: Diagnosis not present

## 2016-06-17 MED ORDER — MORPHINE SULFATE (PF) 4 MG/ML IV SOLN
4.0000 mg | Freq: Once | INTRAVENOUS | Status: AC
  Administered 2016-06-17: 4 mg via INTRAVENOUS
  Filled 2016-06-17: qty 1

## 2016-06-17 MED ORDER — OXYCODONE-ACETAMINOPHEN 5-325 MG PO TABS
1.0000 | ORAL_TABLET | Freq: Once | ORAL | Status: AC
  Administered 2016-06-17: 1 via ORAL
  Filled 2016-06-17: qty 1

## 2016-06-17 MED ORDER — OXYCODONE-ACETAMINOPHEN 5-325 MG PO TABS
2.0000 | ORAL_TABLET | Freq: Once | ORAL | Status: DC
Start: 2016-06-17 — End: 2016-06-17

## 2016-06-17 MED ORDER — IBUPROFEN 200 MG PO TABS
600.0000 mg | ORAL_TABLET | Freq: Once | ORAL | Status: AC
  Administered 2016-06-17: 600 mg via ORAL
  Filled 2016-06-17: qty 3

## 2016-06-17 MED ORDER — OXYCODONE-ACETAMINOPHEN 5-325 MG PO TABS: 1.0000 | ORAL_TABLET | ORAL | 0 refills | Status: DC | PRN

## 2016-06-17 MED ORDER — MORPHINE SULFATE (PF) 4 MG/ML IV SOLN: 4.0000 mg | Freq: Once | INTRAVENOUS | Status: DC

## 2016-06-17 MED ORDER — ONDANSETRON 8 MG PO TBDP: 8.0000 mg | ORAL_TABLET | Freq: Three times a day (TID) | ORAL | 0 refills | Status: DC | PRN

## 2016-06-17 NOTE — ED Notes (Signed)
Could not get E-pad to sign.  Pt consented to discharge.

## 2016-06-17 NOTE — ED Triage Notes (Signed)
Per EMS pt had fall today, was going up the stairs with husband. husband lost balance , fell . Pt attempted to catch husband , lost her balance , fell and ended having husband falling on her. Reports right knee"pop", no obvious deformity nor edema.

## 2016-06-17 NOTE — ED Provider Notes (Signed)
[facility] DEPT Provider Note   CSN: [id number] Arrival date & time: 06/17/16  1253     History   Chief Complaint Chief Complaint  Patient presents with  . Fall    right knee pain    HPI Anne Hogan is a 70 y.o. female.  HPI Patient reports injury to her right knee after a fall today when going down 2 steps.  She was taking her husband up the steps and fell backwards with her husband.  She reports sudden severe pain in her right knee.  No head injury.  Denies neck pain.  Denies weakness of her arms or legs.  Denies chest or abdominal pain.  She reports significant pain with bending of her right knee.  She's been unable to ambulate since the fall.   Past Medical History:  Diagnosis Date  . ADD (attention deficit disorder)   . Arthritis    "right knee" (04/19/2013)  . Claustrophobia   . Depression   . GERD (gastroesophageal reflux disease)   . Migraine    "bad when I was in 5th or 6th grade; I can usually ward them off by resting my head now" (04/19/2013)  . Sleep apnea    pt uses mouth guard but does not wear machine d/t claustrophobia  . Squamous carcinoma ?1999   "upper left groin" (04/19/2013)    There are no active problems to display for this patient.   Past Surgical History:  Procedure Laterality Date  . CHOLECYSTECTOMY  04/18/2013  . CHOLECYSTECTOMY N/A 04/18/2013   Procedure: LAPAROSCOPIC CHOLECYSTECTOMY WITH INTRAOPERATIVE CHOLANGIOGRAM;  Surgeon: Ralene Ok, MD;  Location: [facility];  Service: General;  Laterality: N/A;  . COLONOSCOPY W/ POLYPECTOMY    . KNEE ARTHROSCOPY Right 12/2007   "meniscus repair" (04/19/2013)  . RETINAL TEAR REPAIR CRYOTHERAPY Left ~ 1997  . TONSILLECTOMY  ~ 1952  . VARICOSE VEIN SURGERY Right 2000's  . WRIST SURGERY Right 1975   "had a knot taken off" (04/19/2013)    OB History    No data available       Home Medications    Prior to Admission medications   Medication Sig Start Date End Date Taking? Authorizing  Provider  buPROPion (WELLBUTRIN XL) 300 MG 24 hr tablet Take 300 mg by mouth daily.   Yes Historical Provider, MD  Vitamin D, Ergocalciferol, (DRISDOL) 50000 UNITS CAPS Take 50,000 Units by mouth 2 (two) times a week. Take on Sunday, and Thursday.   Yes Historical Provider, MD  ondansetron (ZOFRAN ODT) 8 MG disintegrating tablet Take 1 tablet (8 mg total) by mouth every 8 (eight) hours as needed for nausea or vomiting. 06/17/16   Jola Schmidt, MD  oxyCODONE-acetaminophen (PERCOCET/ROXICET) 5-325 MG tablet Take 1 tablet by mouth every 4 (four) hours as needed for severe pain. 06/17/16   Jola Schmidt, MD    Family History Family History  Problem Relation Age of Onset  . Tremor Mother   . Osteoporosis Mother   . Sarcoidosis Mother   . Hypertension Mother   . Congestive Heart Failure Father   . Heart attack Father   . Diabetes Mellitus II Father   . Diabetes Mellitus II Brother     Social History Social History  Substance Use Topics  . Smoking status: Former Smoker    Packs/day: 1.50    Years: 20.00    Types: Cigarettes    Quit date: 11/30/1986  . Smokeless tobacco: Never Used  . Alcohol use Yes     Comment:  04/19/2013 "stopped drinking in 2000; never had problem w/it"     Allergies   Penicillins; Codeine; and Milk-related compounds   Review of Systems Review of Systems  All other systems reviewed and are negative.    Physical Exam Updated Vital Signs BP 135/69   Pulse (!) 57   Temp 97.8 F (36.6 C) (Oral)   Resp 13   SpO2 100%   Physical Exam  Constitutional: She is oriented to person, place, and time. She appears well-developed and well-nourished. No distress.  HENT:  Head: Normocephalic and atraumatic.  Eyes: EOM are normal.  Neck: Normal range of motion. Neck supple.  No C-spine tenderness  Cardiovascular: Normal rate, regular rhythm and normal heart sounds.   Pulmonary/Chest: Effort normal and breath sounds normal.  Abdominal: Soft. She exhibits no  distension. There is no tenderness.  Musculoskeletal: Normal range of motion.  Swelling and tenderness of the right knee.  Majority of tenderness is over the lateral aspect of the right knee.  Normal pulses right foot.  Compartments are soft.  Severe pain with range of motion the right knee.  Full range of motion of right hip and right ankle.  Neurological: She is alert and oriented to person, place, and time.  Skin: Skin is warm and dry.  Psychiatric: She has a normal mood and affect. Judgment normal.  Nursing note and vitals reviewed.    ED Treatments / Results  Labs (all labs ordered are listed, but only abnormal results are displayed) Labs Reviewed - No data to display  EKG  EKG Interpretation  Date/Time:  Wednesday June 17 2016 18:10:14 EST Ventricular Rate:  55 PR Interval:    QRS Duration: 102 QT Interval:  459 QTC Calculation: 439 R Axis:   84 Text Interpretation:  Sinus rhythm Ventricular bigeminy Borderline right axis deviation Borderline repolarization abnormality No significant change was found Confirmed by Mico Spark  MD, Climmie Cronce ([id number]) on 06/17/2016 7:01:47 PM       Radiology Ct Knee Right Wo Contrast  Result Date: 06/17/2016 CLINICAL DATA:  Fall today.  Tibial plateau fracture. EXAM: CT OF THE right KNEE WITHOUT CONTRAST TECHNIQUE: Multidetector CT imaging of the right knee was performed according to the standard protocol. Multiplanar CT image reconstructions were also generated. COMPARISON:  None. FINDINGS: Bones/Joint/Cartilage Schatzker 2 fracture of the proximal tibia noted with a vertical split component of the lateral tibial plateau and a considerable central depressed component with comminution along the depressed surface. 8 mm of depression. No cortical fragments embedded down into the fracture planes. The split component is relatively nondisplaced. There is some small cortical gaps along the depressed component. No involvement of the medial tibial plateau.  Severe tricompartmental osteoarthritis with prominent marginal spurring and loss of articular cartilage thickness. Lipohemarthrosis. Ligaments Suboptimally assessed by CT. Muscles and Tendons Unremarkable Soft tissues Unremarkable IMPRESSION: 1. Schatzker to fracture the lateral tibial plateau, about 8 mm of depression of the depressed component. 2. Lipohemarthrosis. 3. Severe tricompartmental osteoarthritis. Electronically Signed   By: Van Clines M.D.   On: 06/17/2016 17:21   Dg Knee Complete 4 Views Right  Result Date: 06/17/2016 CLINICAL DATA:  Fall. EXAM: RIGHT KNEE - COMPLETE 4+ VIEW COMPARISON:  MRI 10/23/2007. FINDINGS: Tricompartment degenerative change. Deformity noted of the lateral tibial plateau. Fracture cannot be excluded. MRI can be obtained to further evaluate . Prominent knee joint effusion. IMPRESSION: 1. Deformity noted of the lateral tibial plateau suggesting fracture. MRI can be obtained to further evaluate. Prominent knee joint effusion. 2.  Diffuse tricompartment degenerative change. Electronically Signed   By: Marcello Moores  Register   On: 06/17/2016 15:20   Dg Hip Unilat W Or Wo Pelvis 2-3 Views Right  Result Date: 06/17/2016 CLINICAL DATA:  [place] today, RIGHT hip and RIGHT knee pain, lost her balance when she tried to catch her husband who was falling EXAM: DG HIP (WITH OR WITHOUT PELVIS) 2-3V RIGHT COMPARISON:  None FINDINGS: Slight rotation to the RIGHT on pelvic radiograph. Hip and SI joint spaces preserved. Bones appear demineralized. No acute fracture, dislocation, or bone destruction. IMPRESSION: No acute osseous abnormalities. Electronically Signed   By: Lavonia Dana M.D.   On: 06/17/2016 17:14    Procedures Procedures (including critical care time)  Medications Ordered in ED Medications  morphine 4 MG/ML injection 4 mg (not administered)  ibuprofen (ADVIL,MOTRIN) tablet 600 mg (600 mg Oral Given 06/17/16 1633)  morphine 4 MG/ML injection 4 mg (4 mg Intravenous  Given 06/17/16 1634)  oxyCODONE-acetaminophen (PERCOCET/ROXICET) 5-325 MG per tablet 1 tablet (1 tablet Oral Given 06/17/16 1633)     Initial Impression / Assessment and Plan / ED Course  I have reviewed the triage vital signs and the nursing notes.  Pertinent labs & imaging results that were available during my care of the patient were reviewed by me and considered in my medical decision making (see chart for details).  Clinical Course     Depressed right tibial plateau fracture.  Patient placed in knee immobilizer.  Pain control.  Crutches.  Nonweightbearing.  Patient will follow-up with orthopedic surgery early next week.  I briefly discussed the case with Dr. Stann Mainland, orthopedics who personally reviewed her imaging  Patient understands return to the ER for new or worsening symptoms.  Final Clinical Impressions(s) / ED Diagnoses   Final diagnoses:  Tibial plateau fracture, right, closed, initial encounter    New Prescriptions New Prescriptions   ONDANSETRON (ZOFRAN ODT) 8 MG DISINTEGRATING TABLET    Take 1 tablet (8 mg total) by mouth every 8 (eight) hours as needed for nausea or vomiting.   OXYCODONE-ACETAMINOPHEN (PERCOCET/ROXICET) 5-325 MG TABLET    Take 1 tablet by mouth every 4 (four) hours as needed for severe pain.     Jola Schmidt, MD 06/17/16 1919

## 2016-06-23 DIAGNOSIS — S82141A Displaced bicondylar fracture of right tibia, initial encounter for closed fracture: Secondary | ICD-10-CM | POA: Diagnosis not present

## 2016-06-29 DIAGNOSIS — S82141D Displaced bicondylar fracture of right tibia, subsequent encounter for closed fracture with routine healing: Secondary | ICD-10-CM | POA: Diagnosis not present

## 2016-06-29 DIAGNOSIS — Z9181 History of falling: Secondary | ICD-10-CM | POA: Diagnosis not present

## 2016-06-29 DIAGNOSIS — M1711 Unilateral primary osteoarthritis, right knee: Secondary | ICD-10-CM | POA: Diagnosis not present

## 2016-06-29 DIAGNOSIS — M810 Age-related osteoporosis without current pathological fracture: Secondary | ICD-10-CM | POA: Diagnosis not present

## 2016-06-29 DIAGNOSIS — F329 Major depressive disorder, single episode, unspecified: Secondary | ICD-10-CM | POA: Diagnosis not present

## 2016-06-29 DIAGNOSIS — Z87442 Personal history of urinary calculi: Secondary | ICD-10-CM | POA: Diagnosis not present

## 2016-06-29 DIAGNOSIS — Z9842 Cataract extraction status, left eye: Secondary | ICD-10-CM | POA: Diagnosis not present

## 2016-06-29 DIAGNOSIS — W109XXD Fall (on) (from) unspecified stairs and steps, subsequent encounter: Secondary | ICD-10-CM | POA: Diagnosis not present

## 2016-06-29 DIAGNOSIS — Z9841 Cataract extraction status, right eye: Secondary | ICD-10-CM | POA: Diagnosis not present

## 2016-07-06 DIAGNOSIS — M810 Age-related osteoporosis without current pathological fracture: Secondary | ICD-10-CM | POA: Diagnosis not present

## 2016-07-06 DIAGNOSIS — M1711 Unilateral primary osteoarthritis, right knee: Secondary | ICD-10-CM | POA: Diagnosis not present

## 2016-07-06 DIAGNOSIS — W109XXD Fall (on) (from) unspecified stairs and steps, subsequent encounter: Secondary | ICD-10-CM | POA: Diagnosis not present

## 2016-07-06 DIAGNOSIS — Z9842 Cataract extraction status, left eye: Secondary | ICD-10-CM | POA: Diagnosis not present

## 2016-07-06 DIAGNOSIS — S82141D Displaced bicondylar fracture of right tibia, subsequent encounter for closed fracture with routine healing: Secondary | ICD-10-CM | POA: Diagnosis not present

## 2016-07-06 DIAGNOSIS — F329 Major depressive disorder, single episode, unspecified: Secondary | ICD-10-CM | POA: Diagnosis not present

## 2016-07-06 DIAGNOSIS — Z87442 Personal history of urinary calculi: Secondary | ICD-10-CM | POA: Diagnosis not present

## 2016-07-06 DIAGNOSIS — Z9841 Cataract extraction status, right eye: Secondary | ICD-10-CM | POA: Diagnosis not present

## 2016-07-06 DIAGNOSIS — Z9181 History of falling: Secondary | ICD-10-CM | POA: Diagnosis not present

## 2016-07-07 DIAGNOSIS — S82141D Displaced bicondylar fracture of right tibia, subsequent encounter for closed fracture with routine healing: Secondary | ICD-10-CM | POA: Diagnosis not present

## 2016-07-27 ENCOUNTER — Telehealth: Payer: Self-pay | Admitting: Cardiology

## 2016-07-27 NOTE — Telephone Encounter (Signed)
Informed pt that clearance request for total knee replacement came into office.  Explained that she would need to see Dr. Curt Bears before he would be able to clear her for procedure.  OV scheduled for 2/6. Patient verbalized understanding and agreeable to plan.

## 2016-07-27 NOTE — Telephone Encounter (Signed)
New Message ° °Pt voiced she is returning nurses call. ° °Please f/u °

## 2016-07-31 DIAGNOSIS — S82141D Displaced bicondylar fracture of right tibia, subsequent encounter for closed fracture with routine healing: Secondary | ICD-10-CM | POA: Diagnosis not present

## 2016-08-04 ENCOUNTER — Telehealth: Payer: Self-pay | Admitting: Cardiology

## 2016-08-04 NOTE — Telephone Encounter (Signed)
New Message     They are going to move her surgery up to 08/31/16 can she get in earlier to get clearance or does she have to wait a month in between

## 2016-08-04 NOTE — Telephone Encounter (Signed)
Pt seeing another doctor on Friday.  If they agree she can move up her surgery date -  she is asking if she can get in to see Dr. Curt Bears sooner if necessary.  Advised pt I would work her in if needed.  She will call me Monday if she needs sooner appt. She thanks me for my help.

## 2016-08-07 DIAGNOSIS — S82141D Displaced bicondylar fracture of right tibia, subsequent encounter for closed fracture with routine healing: Secondary | ICD-10-CM | POA: Diagnosis not present

## 2016-08-21 DIAGNOSIS — S82141D Displaced bicondylar fracture of right tibia, subsequent encounter for closed fracture with routine healing: Secondary | ICD-10-CM | POA: Diagnosis not present

## 2016-08-25 ENCOUNTER — Ambulatory Visit (INDEPENDENT_AMBULATORY_CARE_PROVIDER_SITE_OTHER): Payer: PPO | Admitting: Cardiology

## 2016-08-25 ENCOUNTER — Encounter: Payer: Self-pay | Admitting: Cardiology

## 2016-08-25 VITALS — BP 122/62 | HR 89 | Ht 63.0 in | Wt 166.0 lb

## 2016-08-25 DIAGNOSIS — I341 Nonrheumatic mitral (valve) prolapse: Secondary | ICD-10-CM | POA: Diagnosis not present

## 2016-08-25 DIAGNOSIS — R002 Palpitations: Secondary | ICD-10-CM | POA: Diagnosis not present

## 2016-08-25 DIAGNOSIS — I340 Nonrheumatic mitral (valve) insufficiency: Secondary | ICD-10-CM | POA: Insufficient documentation

## 2016-08-25 NOTE — Patient Instructions (Signed)
Medication Instructions:  Your physician recommends that you continue on your current medications as directed. Please refer to the Current Medication list given to you today.  If you need a refill on your cardiac medications before your next appointment, please call your pharmacy.   Labwork: None ordered  Testing/Procedures: None ordered  Follow-Up: Your physician wants you to follow-up in: 6 months with Dr. Camnitz.  You will receive a reminder letter in the mail two months in advance. If you don't receive a letter, please call our office to schedule the follow-up appointment.  Thank you for choosing [facility]!!   Lowella Kindley, RN [phone]         

## 2016-08-25 NOTE — Progress Notes (Signed)
Cardiology Office Note   Date:  08/25/2016   ID:  Anne Hogan, DOB 1946-06-19, MRN [id number]  PCP:  Anne Ringer, MD  Cardiologist:  Anne Haw, MD    Chief Complaint  Patient presents with  . Follow-up    Palpitations/MVR     History of Present Illness: Anne Hogan is a 71 y.o. female who presents today for cardiology evaluation.   She was seen by her primary physician on April 13 with a concern for a prolonged respiratory illness. She took Tamiflu and a Z-Pak. She had improved but was tired and noticed that while the grocery store when she moved to get out of someone's way she started walking in an attempt to go to the right she started walking to the left, made several steps and couldn't get her back on same path. Since that time, she is felt well without any major complaints. She has a few episodes of palpitations maybe once a month that wake her up at night. She says that she coughs and goes back to sleep without many more issues. He is planning to have right knee replacement in March.   Today, she denies symptoms of palpitations, chest pain, shortness of breath, orthopnea, PND, lower extremity edema, claudication, dizziness, presyncope, syncope, bleeding, or neurologic sequela. The patient is tolerating medications without difficulties and is otherwise without complaint today.    Past Medical History:  Diagnosis Date  . ADD (attention deficit disorder)   . Arthritis    "right knee" (04/19/2013)  . Claustrophobia   . Depression   . GERD (gastroesophageal reflux disease)   . Migraine    "bad when I was in 5th or 6th grade; I can usually ward them off by resting my head now" (04/19/2013)  . Sleep apnea    pt uses mouth guard but does not wear machine d/t claustrophobia  . Squamous carcinoma ?1999   "upper left groin" (04/19/2013)   Past Surgical History:  Procedure Laterality Date  . CHOLECYSTECTOMY  04/18/2013  . CHOLECYSTECTOMY N/A 04/18/2013   Procedure:  LAPAROSCOPIC CHOLECYSTECTOMY WITH INTRAOPERATIVE CHOLANGIOGRAM;  Surgeon: Ralene Ok, MD;  Location: [facility];  Service: General;  Laterality: N/A;  . COLONOSCOPY W/ POLYPECTOMY    . KNEE ARTHROSCOPY Right 12/2007   "meniscus repair" (04/19/2013)  . RETINAL TEAR REPAIR CRYOTHERAPY Left ~ 1997  . TONSILLECTOMY  ~ 1952  . VARICOSE VEIN SURGERY Right 2000's  . WRIST SURGERY Right 1975   "had a knot taken off" (04/19/2013)     Current Outpatient Prescriptions  Medication Sig Dispense Refill  . buPROPion (WELLBUTRIN XL) 300 MG 24 hr tablet Take 300 mg by mouth daily.    . Vitamin D, Ergocalciferol, (DRISDOL) 50000 UNITS CAPS Take 50,000 Units by mouth 2 (two) times a week. Take on Sunday, and Thursday.     No current facility-administered medications for this visit.     Allergies:   Penicillins; Codeine; and Milk-related compounds   Social History:  The patient  reports that she quit smoking about 29 years ago. Her smoking use included Cigarettes. She has a 30.00 pack-year smoking history. She has never used smokeless tobacco. She reports that she drinks alcohol. She reports that she does not use drugs.   Family History:  The patient's family history includes Congestive Heart Failure in her father; Diabetes Mellitus II in her brother and father; Heart attack in her father; Hypertension in her mother; Osteoporosis in her mother; Sarcoidosis in her mother; Tremor in  her mother.    ROS:  Please see the history of present illness.   Otherwise, review of systems is positive for none.   All other systems are reviewed and negative.    PHYSICAL EXAM: VS:  BP 122/62   Pulse 89   Ht '5\' 3"'$  (1.6 m)   Wt 166 lb (75.3 kg)   SpO2 98%   BMI 29.41 kg/m  , BMI Body mass index is 29.41 kg/m. GEN: Well nourished, well developed, in no acute distress  HEENT: normal  Neck: no JVD, carotid bruits, or masses Cardiac: RRR; no murmurs, rubs, or gallops,no edema  Respiratory:  clear to auscultation  bilaterally, normal work of breathing GI: soft, nontender, nondistended, + BS MS: no deformity or atrophy  Skin: warm and dry Neuro:  Strength and sensation are intact Psych: euthymic mood, full affect  EKG:  EKG is not ordered today. Personal review of the ekg ordered 11/30/17shows sinus rhythm rate 55, PVCs  Recent Labs: No results found for requested labs within last 8760 hours.    Lipid Panel  No results found for: CHOL, TRIG, HDL, CHOLHDL, VLDL, LDLCALC, LDLDIRECT   Wt Readings from Last 3 Encounters:  08/25/16 166 lb (75.3 kg)  11/13/15 168 lb (76.2 kg)  05/16/13 161 lb (73 kg)      Other studies Reviewed: Additional studies/ records that were reviewed today include: TTE 10/28/15  - Left ventricle: The cavity size was normal. Wall thickness was  normal. Systolic function was normal. The estimated ejection  fraction was in the range of 60% to 65%. Wall motion was normal;  there were no regional wall motion abnormalities. Features are  consistent with a pseudonormal left ventricular filling pattern,  with concomitant abnormal relaxation and increased filling  pressure (grade 2 diastolic dysfunction). Longitudinal strain,  TDI: -18 %. - Aortic valve: There was mild regurgitation. - Mitral valve: Mild prolapse, involving the anterior leaflet and  the posterior leaflet. There was moderate to severe  regurgitation. - Left atrium: The atrium was mildly dilated.  Carotid artery duplex 10/16/15 No evidence of carotid artery stenosis bilateral  ASSESSMENT AND PLAN:  1.  Mitral valve regurgitation: Likely caused by her mitral valve prolapse. She is currently not having symptoms of shortness of breath. She feels like she is doing well not having evidence of fatigue or shortness of breath. As it is been almost one year since her most recent echocardiogram, Jomaira Darr repeat echo to determine if her mitral valve disease has changed.  2. Palpitations: Currently having only  minor episodic palpitations once or twice a month at night. She says that at the current time, she feels well and does not feel like medical management as necessary. We'll continue to monitor and possibly start a beta blocker if she feels more palpitations.    Current medicines are reviewed at length with the patient today.   The patient does not have concerns regarding her medicines.  The following changes were made today:  none  Labs/ tests ordered today include:  No orders of the defined types were placed in this encounter.    Disposition:   FU with Fina Heizer 6 months  Signed, At[place] Kimberlin Meredith Leeds, MD  08/25/2016 10:06 AM     [facility] [address] [place] [address] [phone] (office) [phone] (fax) Leola Brazil shows labs were also if she would if she would rather for transfer here to fine and she wants to continue care with him then eventually up  to her 6

## 2016-08-25 NOTE — Addendum Note (Signed)
Addended by: Stanton Kidney on: 08/25/2016 10:46 AM   Modules accepted: Orders

## 2016-08-25 NOTE — Progress Notes (Signed)
Called patient to inform her that Dr. Curt Bears would like to have echocardiogram prior to her knee surgery next month. Routine testing prior to clearance. Pt is agreeable and understands office will call her to arrange.

## 2016-08-28 ENCOUNTER — Telehealth: Payer: Self-pay | Admitting: Cardiology

## 2016-08-28 ENCOUNTER — Ambulatory Visit (HOSPITAL_COMMUNITY): Payer: PPO | Attending: Cardiology

## 2016-08-28 ENCOUNTER — Other Ambulatory Visit: Payer: Self-pay

## 2016-08-28 DIAGNOSIS — I341 Nonrheumatic mitral (valve) prolapse: Secondary | ICD-10-CM | POA: Diagnosis not present

## 2016-08-28 DIAGNOSIS — I080 Rheumatic disorders of both mitral and aortic valves: Secondary | ICD-10-CM | POA: Diagnosis not present

## 2016-08-28 NOTE — Telephone Encounter (Deleted)
error 

## 2016-09-03 ENCOUNTER — Encounter: Payer: Self-pay | Admitting: Cardiology

## 2016-09-03 NOTE — Telephone Encounter (Signed)
Clearance for right TKA - medial & lateral w/wo patella resurfacing faxed to [facility]. Instructions: She is an intermediate risk for and intermediate risk procedure.  Keep I=O

## 2016-09-04 ENCOUNTER — Ambulatory Visit: Payer: Self-pay | Admitting: Orthopedic Surgery

## 2016-09-04 DIAGNOSIS — S82141D Displaced bicondylar fracture of right tibia, subsequent encounter for closed fracture with routine healing: Secondary | ICD-10-CM | POA: Diagnosis not present

## 2016-09-04 NOTE — Progress Notes (Signed)
Please place orders in EPIC as patient is being scheduled for a Pre-op appointment! Thank you! 

## 2016-09-04 NOTE — Telephone Encounter (Signed)
lmtcb

## 2016-09-10 ENCOUNTER — Telehealth: Payer: Self-pay | Admitting: *Deleted

## 2016-09-10 DIAGNOSIS — I341 Nonrheumatic mitral (valve) prolapse: Secondary | ICD-10-CM

## 2016-09-10 NOTE — Telephone Encounter (Signed)
-----   Message from Will Meredith Leeds, MD sent at 08/31/2016 11:30 AM EST ----- TTE shows no major change in mitral valve. Should return for limited TTE with contrast as could not accurately assess inferior wall.

## 2016-09-10 NOTE — Telephone Encounter (Signed)
Notes Recorded by Stanton Kidney, RN on 09/04/2016 at 12:51 PM EST Reviewed results with pt. She understands office will call her to arrange limited echo ------

## 2016-09-11 ENCOUNTER — Ambulatory Visit (HOSPITAL_COMMUNITY): Payer: PPO | Attending: Cardiology

## 2016-09-11 ENCOUNTER — Other Ambulatory Visit: Payer: Self-pay

## 2016-09-11 ENCOUNTER — Other Ambulatory Visit: Payer: Self-pay | Admitting: Cardiology

## 2016-09-11 DIAGNOSIS — I341 Nonrheumatic mitral (valve) prolapse: Secondary | ICD-10-CM

## 2016-09-11 MED ORDER — PERFLUTREN LIPID MICROSPHERE
2.0000 mL | INTRAVENOUS | Status: AC | PRN
  Administered 2016-09-11: 2 mL via INTRAVENOUS

## 2016-09-14 ENCOUNTER — Other Ambulatory Visit (HOSPITAL_COMMUNITY): Payer: Self-pay | Admitting: *Deleted

## 2016-09-14 ENCOUNTER — Encounter (HOSPITAL_COMMUNITY): Payer: Self-pay

## 2016-09-14 NOTE — Patient Instructions (Addendum)
Anne Hogan  09/14/2016   Your procedure is scheduled on: 09-21-16  Report to [facility]  Entrance take [facility]  elevators to 3rd floor to  [place] at 835  AM.  Call this number if you have problems the morning of surgery [phone]   Remember: ONLY 1 PERSON MAY GO WITH YOU TO SHORT STAY TO GET  READY MORNING OF [place].  Do not eat food or drink liquids :After Midnight.     Take these medicines the morning of surgery with A SIP OF WATER: BUPROPION (WELLBUTRIN), OCEAN NASAL SPRAY                                You may not have any metal on your body including hair pins and              piercings  Do not wear jewelry, make-up, lotions, powders or perfumes, deodorant             Do not wear nail polish.  Do not shave  48 hours prior to surgery.              Men may shave face and neck.   Do not bring valuables to the hospital. [place].  Contacts, dentures or bridgework may not be worn into surgery.  Leave suitcase in the car. After surgery it may be brought to your room.                   Please read over the following fact sheets you were given: _____________________________________________________________________             [facility] - Preparing for Surgery Before surgery, you can play an important role.  Because skin is not sterile, your skin needs to be as free of germs as possible.  You can reduce the number of germs on your skin by washing with CHG (chlorahexidine gluconate) soap before surgery.  CHG is an antiseptic cleaner which kills germs and bonds with the skin to continue killing germs even after washing. Please DO NOT use if you have an allergy to CHG or antibacterial soaps.  If your skin becomes reddened/irritated stop using the CHG and inform your nurse when you arrive at Short Stay. Do not shave (including legs and underarms) for at least 48 hours prior to the first CHG  shower.  You may shave your face/neck. Please follow these instructions carefully:  1.  Shower with CHG Soap the night before surgery and the  morning of Surgery.  2.  If you choose to wash your hair, wash your hair first as usual with your  normal  shampoo.  3.  After you shampoo, rinse your hair and body thoroughly to remove the  shampoo.                           4.  Use CHG as you would any other liquid soap.  You can apply chg directly  to the skin and wash                       Gently with a scrungie or clean washcloth.  5.  Apply the CHG Soap to your body ONLY FROM THE NECK DOWN.   Do not use on face/ open                           Wound or open sores. Avoid contact with eyes, ears mouth and genitals (private parts).                       Wash face,  Genitals (private parts) with your normal soap.             6.  Wash thoroughly, paying special attention to the area where your surgery  will be performed.  7.  Thoroughly rinse your body with warm water from the neck down.  8.  DO NOT shower/wash with your normal soap after using and rinsing off  the CHG Soap.                9.  Pat yourself dry with a clean towel.            10.  Wear clean pajamas.            11.  Place clean sheets on your bed the night of your first shower and do not  sleep with pets. Day of Surgery : Do not apply any lotions/deodorants the morning of surgery.  Please wear clean clothes to the hospital/surgery center.  FAILURE TO FOLLOW THESE INSTRUCTIONS MAY RESULT IN THE CANCELLATION OF YOUR SURGERY PATIENT SIGNATURE_________________________________  NURSE SIGNATURE__________________________________  ________________________________________________________________________   Adam Phenix  An incentive spirometer is a tool that can help keep your lungs clear and active. This tool measures how well you are filling your lungs with each breath. Taking long deep breaths may help reverse or decrease the chance  of developing breathing (pulmonary) problems (especially infection) following:  A long period of time when you are unable to move or be active. BEFORE THE PROCEDURE   If the spirometer includes an indicator to show your best effort, your nurse or respiratory therapist will set it to a desired goal.  If possible, sit up straight or lean slightly forward. Try not to slouch.  Hold the incentive spirometer in an upright position. INSTRUCTIONS FOR USE  1. Sit on the edge of your bed if possible, or sit up as far as you can in bed or on a chair. 2. Hold the incentive spirometer in an upright position. 3. Breathe out normally. 4. Place the mouthpiece in your mouth and seal your lips tightly around it. 5. Breathe in slowly and as deeply as possible, raising the piston or the ball toward the top of the column. 6. Hold your breath for 3-5 seconds or for as long as possible. Allow the piston or ball to fall to the bottom of the column. 7. Remove the mouthpiece from your mouth and breathe out normally. 8. Rest for a few seconds and repeat Steps 1 through 7 at least 10 times every 1-2 hours when you are awake. Take your time and take a few normal breaths between deep breaths. 9. The spirometer may include an indicator to show your best effort. Use the indicator as a goal to work toward during each repetition. 10. After each set of 10 deep breaths, practice coughing to be sure your lungs are clear. If you have an incision (the cut made at the time of surgery), support your incision when coughing by placing a  pillow or rolled up towels firmly against it. Once you are able to get out of bed, walk around indoors and cough well. You may stop using the incentive spirometer when instructed by your caregiver.  RISKS AND COMPLICATIONS  Take your time so you do not get dizzy or light-headed.  If you are in pain, you may need to take or ask for pain medication before doing incentive spirometry. It is harder to  take a deep breath if you are having pain. AFTER USE  Rest and breathe slowly and easily.  It can be helpful to keep track of a log of your progress. Your caregiver can provide you with a simple table to help with this. If you are using the spirometer at home, follow these instructions: [place] IF:   You are having difficultly using the spirometer.  You have trouble using the spirometer as often as instructed.  Your pain medication is not giving enough relief while using the spirometer.  You develop fever of 100.5 F (38.1 C) or higher. SEEK IMMEDIATE MEDICAL CARE IF:   You cough up bloody sputum that had not been present before.  You develop fever of 102 F (38.9 C) or greater.  You develop worsening pain at or near the incision site. MAKE SURE YOU:   Understand these instructions.  Will watch your condition.  Will get help right away if you are not doing well or get worse. Document Released: 11/16/2006 Document Revised: 09/28/2011 Document Reviewed: 01/17/2007 ExitCare Patient Information 2014 ExitCare, [place].   ________________________________________________________________________  WHAT IS A BLOOD TRANSFUSION? Blood Transfusion Information  A transfusion is the replacement of blood or some of its parts. Blood is made up of multiple cells which provide different functions.  Red blood cells carry oxygen and are used for blood loss replacement.  White blood cells fight against infection.  Platelets control bleeding.  Plasma helps clot blood.  Other blood products are available for specialized needs, such as hemophilia or other clotting disorders. BEFORE THE TRANSFUSION  Who gives blood for transfusions?   Healthy volunteers who are fully evaluated to make sure their blood is safe. This is blood bank blood. Transfusion therapy is the safest it has ever been in the practice of medicine. Before blood is taken from a donor, a complete history is taken to  make sure that person has no history of diseases nor engages in risky social behavior (examples are intravenous drug use or sexual activity with multiple partners). The donor's travel history is screened to minimize risk of transmitting infections, such as malaria. The donated blood is tested for signs of infectious diseases, such as HIV and hepatitis. The blood is then tested to be sure it is compatible with you in order to minimize the chance of a transfusion reaction. If you or a relative donates blood, this is often done in anticipation of surgery and is not appropriate for emergency situations. It takes many days to process the donated blood. RISKS AND COMPLICATIONS Although transfusion therapy is very safe and saves many lives, the main dangers of transfusion include:   Getting an infectious disease.  Developing a transfusion reaction. This is an allergic reaction to something in the blood you were given. Every precaution is taken to prevent this. The decision to have a blood transfusion has been considered carefully by your caregiver before blood is given. Blood is not given unless the benefits outweigh the risks. AFTER THE TRANSFUSION  Right after receiving a blood transfusion, you  will usually feel much better and more energetic. This is especially true if your red blood cells have gotten low (anemic). The transfusion raises the level of the red blood cells which carry oxygen, and this usually causes an energy increase.  The nurse administering the transfusion will monitor you carefully for complications. HOME CARE INSTRUCTIONS  No special instructions are needed after a transfusion. You may find your energy is better. Speak with your caregiver about any limitations on activity for underlying diseases you may have. SEEK MEDICAL CARE IF:   Your condition is not improving after your transfusion.  You develop redness or irritation at the intravenous (IV) site. SEEK IMMEDIATE MEDICAL CARE  IF:  Any of the following symptoms occur over the next 12 hours:  Shaking chills.  You have a temperature by mouth above 102 F (38.9 C), not controlled by medicine.  Chest, back, or muscle pain.  People around you feel you are not acting correctly or are confused.  Shortness of breath or difficulty breathing.  Dizziness and fainting.  You get a rash or develop hives.  You have a decrease in urine output.  Your urine turns a dark color or changes to pink, red, or brown. Any of the following symptoms occur over the next 10 days:  You have a temperature by mouth above 102 F (38.9 C), not controlled by medicine.  Shortness of breath.  Weakness after normal activity.  The white part of the eye turns yellow (jaundice).  You have a decrease in the amount of urine or are urinating less often.  Your urine turns a dark color or changes to pink, red, or brown. Document Released: 07/03/2000 Document Revised: 09/28/2011 Document Reviewed: 02/20/2008 [facility] Patient Information 2014 [place], [place].  _______________________________________________________________________

## 2016-09-14 NOTE — Progress Notes (Signed)
CARDIAC CLEARANCE NOTE DR CAMNITZ 08-25-16 ON CHART LOV DR CAMNITZ 08-25-16 EPIC ECHO 09-11-16 EPIC EKG 06-17-16 EPIC

## 2016-09-16 ENCOUNTER — Encounter (HOSPITAL_COMMUNITY)
Admission: RE | Admit: 2016-09-16 | Discharge: 2016-09-16 | Disposition: A | Discharge status: Home / Self Care | Payer: PPO | Source: Ambulatory Visit | Attending: Orthopedic Surgery | Admitting: Orthopedic Surgery

## 2016-09-16 ENCOUNTER — Encounter (HOSPITAL_COMMUNITY): Payer: Self-pay

## 2016-09-16 DIAGNOSIS — Z01812 Encounter for preprocedural laboratory examination: Secondary | ICD-10-CM | POA: Diagnosis not present

## 2016-09-16 HISTORY — DX: Displaced bicondylar fracture of right tibia, initial encounter for closed fracture: S82.141A

## 2016-09-16 HISTORY — DX: Cardiac murmur, unspecified: R01.1

## 2016-09-16 HISTORY — DX: Nonrheumatic mitral (valve) prolapse: I34.1

## 2016-09-16 LAB — COMPREHENSIVE METABOLIC PANEL
ALBUMIN: 4.2 g/dL (ref 3.5–5.0)
ALT: 22 U/L (ref 14–54)
AST: 20 U/L (ref 15–41)
Alkaline Phosphatase: 53 U/L (ref 38–126)
Anion gap: 6 (ref 5–15)
BILIRUBIN TOTAL: 0.4 mg/dL (ref 0.3–1.2)
BUN: 14 mg/dL (ref 6–20)
CO2: 27 mmol/L (ref 22–32)
Calcium: 9.7 mg/dL (ref 8.9–10.3)
Chloride: 106 mmol/L (ref 101–111)
Creatinine, Ser: 1.06 mg/dL — ABNORMAL HIGH (ref 0.44–1.00)
GFR calc Af Amer: >60 mL/min (ref >60)
GFR calc non Af Amer: 52 mL/min — ABNORMAL LOW (ref >60)
GLUCOSE: 93 mg/dL (ref 65–99)
POTASSIUM: 4.2 mmol/L (ref 3.5–5.1)
SODIUM: 139 mmol/L (ref 135–145)
TOTAL PROTEIN: 7.1 g/dL (ref 6.5–8.1)

## 2016-09-16 LAB — CBC
HEMATOCRIT: 41.2 % (ref 36.0–46.0)
HEMOGLOBIN: 13.6 g/dL (ref 12.0–15.0)
MCH: 29.3 pg (ref 26.0–34.0)
MCHC: 33 g/dL (ref 30.0–36.0)
MCV: 88.8 fL (ref 78.0–100.0)
Platelets: 313 10*3/uL (ref 150–400)
RBC: 4.64 MIL/uL (ref 3.87–5.11)
RDW: 12.9 % (ref 11.5–15.5)
WBC: 5.6 10*3/uL (ref 4.0–10.5)

## 2016-09-16 LAB — PROTIME-INR
INR: 0.96
Prothrombin Time: 12.8 seconds (ref 11.4–15.2)

## 2016-09-16 LAB — SURGICAL PCR SCREEN
MRSA, PCR: NEGATIVE
STAPHYLOCOCCUS AUREUS: NEGATIVE

## 2016-09-16 LAB — ABO/RH: ABO/RH(D): O POS

## 2016-09-16 LAB — APTT: APTT: 36 s (ref 24–36)

## 2016-09-17 ENCOUNTER — Ambulatory Visit: Payer: Self-pay | Admitting: Orthopedic Surgery

## 2016-09-17 NOTE — H&P (Signed)
Anne Hogan DOB: [birth year] Married / Language: English / Race: White Female Date of Admission:  09/21/2016 CC:  Right knee pain History of Present Illness The patient is a 71 year old female who comes in for a preoperative History and Physical. The patient is scheduled for a right total knee arthroplasty to be performed by Dr. Dione Plover. Aluisio, MD at [facility] on 09-21-2016. The patient is a 71 year old female who presented for follow up of their knee. The patient is being followed for their right tibial plateau fx. They are now several months out from injury (fall). Symptoms reported include: pain. The patient feels that they are doing well and report their pain level to be moderate (pain varies). Current treatment includes: home exercise program and modified weightbearing (touch down weightbearing/using walker/wheelchair). The following medication has been used for pain control: none. [name] has been getting prepared for her upcoming right total knee arthroplasty for some time now. She feels like she has gotten a lot of motion back in the knee now. She had a bad tibial plateau fracture but is not having pain from the fracture. She is just having her arthritic pain now. She is ready to proceed with the knee replacement at this time. They have been treated conservatively in the past for the above stated problem and despite conservative measures, they continue to have progressive pain and severe functional limitations and dysfunction. They have completed her non-operative management of the the fracture to include bracing. It is felt that they would benefit from undergoing total joint replacement. Risks and benefits of the procedure have been discussed with the patient and they elect to proceed with surgery. There are no active contraindications to surgery such as ongoing infection or rapidly progressive neurological disease.  Problem List/Past Medical  Closed fracture of right tibial  plateau Depression  Gastroesophageal Reflux Disease  Migraine Headache  Osteoporosis  Sleep Apnea  Tinnitus Heart murmur  Mitral Valve Prolapse  Varicose veins  Hemorrhoids  Menopause  Cellulitis  Past History Measles  Mumps  Osteopenia  Allergies Codeine Sulfate *ANALGESICS - OPIOID*  PenicillAMINE *Miscellaneous Therapeutic Classes  Rash.  Family History Congestive Heart Failure  Father. Diabetes Mellitus  Brother, Father, Maternal Grandmother. First Degree Relatives  reported Heart disease in female family member before age 59   Social History  Children  0 Current drinker  06/23/2016: Currently drinks beer and wine only occasionally per week Current work status  retired Exercise  Exercises rarely Living situation  live with spouse Marital status  married No history of drug/alcohol rehab  Not under pain contract  Number of flights of stairs before winded  1 Tobacco / smoke exposure  06/23/2016: no Tobacco use  Former smoker. 06/23/2016: smoke(d) 1 1/2 pack(s) per day  Medication History  Multiple Vitamin (1 (one) Oral) Active. Calcium Citrate ('200MG'$  Tablet, 1 (one) Oral) Active. BuPROPion HCl ER (XL) ('300MG'$  Tablet ER 24HR, Oral) Active. Vitamin D (Ergocalciferol) (50000UNIT Capsule, Oral) Active.  Past Surgical History Arthroscopy of Knee  right Breast Biopsy  right Cataract Surgery  bilateral Colon Polyp Removal - Colonoscopy  Gallbladder Surgery  laporoscopic    Review of Systems General Not Present- Chills, Fatigue, Fever, Memory Loss, Night Sweats, Weight Gain and Weight Loss. Skin Not Present- Eczema, Hives, Itching, Lesions and Rash. HEENT Present- Tinnitus. Not Present- Dentures, Double Vision, Headache, Hearing Loss and Visual Loss. Respiratory Not Present- Allergies, Chronic Cough, Coughing up blood, Shortness of breath at rest and Shortness of  breath with exertion. Cardiovascular Present- Palpitations. Not  Present- Chest Pain, Difficulty Breathing Lying Down, Murmur, Racing/skipping heartbeats and Swelling. Gastrointestinal Not Present- Abdominal Pain, Bloody Stool, Constipation, Diarrhea, Difficulty Swallowing, Heartburn, Jaundice, Loss of appetitie, Nausea and Vomiting. Female Genitourinary Not Present- Blood in Urine, Discharge, Flank Pain, Incontinence, Painful Urination, Urgency, Urinary frequency, Urinary Retention, Urinating at Night and Weak urinary stream. Musculoskeletal Present- Joint Pain. Not Present- Back Pain, Joint Swelling, Morning Stiffness, Muscle Pain, Muscle Weakness and Spasms. Neurological Not Present- Blackout spells, Difficulty with balance, Dizziness, Paralysis, Tremor and Weakness. Psychiatric Not Present- Insomnia.  Vitals  Weight: 165 lb Height: 62.75in Weight was reported by patient. Height was reported by patient. Body Surface Area: 1.78 m Body Mass Index: 29.46 kg/m  Pulse: 76 (Regular)  BP: 118/64 (Sitting, Right Arm, Standard)  Physical Exam General Mental Status -Alert, cooperative and good historian. General Appearance-pleasant, Not in acute distress. Orientation-Oriented X3. Build & Nutrition-Well nourished and Well developed.  Head and Neck Head-normocephalic, atraumatic . Neck Global Assessment - supple, no bruit auscultated on the right, no bruit auscultated on the left.  Eye Pupil - Bilateral-Regular and Round. Motion - Bilateral-EOMI.  Chest and Lung Exam Auscultation Breath sounds - clear at anterior chest wall and clear at posterior chest wall. Adventitious sounds - No Adventitious sounds.  Cardiovascular Auscultation Rhythm - Regular rate and rhythm. Heart Sounds - S1 WNL and S2 WNL. Murmurs & Other Heart Sounds - Auscultation of the heart reveals - No Murmurs.  Abdomen Palpation/Percussion Tenderness - Abdomen is non-tender to palpation. Rigidity (guarding) - Abdomen is soft. Auscultation Auscultation of  the abdomen reveals - Bowel sounds normal.  Female Genitourinary Note: Not done, not pertinent to present illness   Musculoskeletal Note: On exam, she is in no distress. Her right knee shows no swelling. She is still tender laterally. She can flex about 40 with full extension. It is the first time she is flexed the knee since she has fractured the tibial plateau.  Her x-rays show no change in position of this tibial plateau fracture. She has got the depressed fragment laterally, but it looks as though everything else has stayed intact.   Assessment & Plan Closed fracture of right tibial plateau  Traumatic Arthritis Right knee  Note:Surgical Plans: Right Total Knee Replacement  Disposition: Home, HHPT following the hospital stay ([place] - already been seen by Leroy Kennedy)  PCP: Dr. Dagmar Hait Cards: Dr. Curt Bears  IV TXA  Anesthesia Issues: None  Patient was instructed on what medications to stop prior to surgery.  Signed electronically by Ok Edwards, III PA-C

## 2016-09-21 ENCOUNTER — Inpatient Hospital Stay (HOSPITAL_COMMUNITY): Payer: PPO | Admitting: Certified Registered Nurse Anesthetist

## 2016-09-21 ENCOUNTER — Encounter (HOSPITAL_COMMUNITY)
Admission: RE | Disposition: A | Discharge status: Home Health | Payer: Self-pay | Source: Ambulatory Visit | Attending: Orthopedic Surgery

## 2016-09-21 ENCOUNTER — Inpatient Hospital Stay (HOSPITAL_COMMUNITY)
Admission: RE | Admit: 2016-09-21 | Discharge: 2016-09-24 | DRG: 470 | Disposition: A | Discharge status: Home Health | Payer: PPO | Source: Ambulatory Visit | Attending: Orthopedic Surgery | Admitting: Orthopedic Surgery

## 2016-09-21 ENCOUNTER — Encounter (HOSPITAL_COMMUNITY): Payer: Self-pay | Admitting: *Deleted

## 2016-09-21 DIAGNOSIS — M1710 Unilateral primary osteoarthritis, unspecified knee: Secondary | ICD-10-CM | POA: Diagnosis present

## 2016-09-21 DIAGNOSIS — M810 Age-related osteoporosis without current pathological fracture: Secondary | ICD-10-CM | POA: Diagnosis present

## 2016-09-21 DIAGNOSIS — G4730 Sleep apnea, unspecified: Secondary | ICD-10-CM | POA: Diagnosis not present

## 2016-09-21 DIAGNOSIS — Z87891 Personal history of nicotine dependence: Secondary | ICD-10-CM

## 2016-09-21 DIAGNOSIS — M179 Osteoarthritis of knee, unspecified: Secondary | ICD-10-CM | POA: Diagnosis present

## 2016-09-21 DIAGNOSIS — K219 Gastro-esophageal reflux disease without esophagitis: Secondary | ICD-10-CM | POA: Diagnosis not present

## 2016-09-21 DIAGNOSIS — M25561 Pain in right knee: Secondary | ICD-10-CM | POA: Diagnosis not present

## 2016-09-21 DIAGNOSIS — M1711 Unilateral primary osteoarthritis, right knee: Principal | ICD-10-CM | POA: Diagnosis present

## 2016-09-21 HISTORY — PX: TOTAL KNEE ARTHROPLASTY: SHX125

## 2016-09-21 LAB — TYPE AND SCREEN
ABO/RH(D): O POS
ANTIBODY SCREEN: NEGATIVE

## 2016-09-21 SURGERY — ARTHROPLASTY, KNEE, TOTAL
Anesthesia: Spinal | Site: Knee | Laterality: Right

## 2016-09-21 MED ORDER — PROPOFOL 500 MG/50ML IV EMUL
INTRAVENOUS | Status: DC | PRN
  Administered 2016-09-21: 100 ug/kg/min via INTRAVENOUS

## 2016-09-21 MED ORDER — POLYETHYLENE GLYCOL 3350 17 G PO PACK
17.0000 g | PACK | Freq: Every day | ORAL | Status: DC | PRN
  Administered 2016-09-21 – 2016-09-24 (×2): 17 g via ORAL
  Filled 2016-09-21 (×2): qty 1

## 2016-09-21 MED ORDER — RIVAROXABAN 10 MG PO TABS
10.0000 mg | ORAL_TABLET | Freq: Every day | ORAL | Status: DC
  Administered 2016-09-23 – 2016-09-24 (×2): 10 mg via ORAL
  Filled 2016-09-21 (×4): qty 1

## 2016-09-21 MED ORDER — FENTANYL CITRATE (PF) 100 MCG/2ML IJ SOLN: 25.0000 ug | INTRAMUSCULAR | Status: DC | PRN

## 2016-09-21 MED ORDER — METHOCARBAMOL 500 MG PO TABS
500.0000 mg | ORAL_TABLET | Freq: Four times a day (QID) | ORAL | Status: DC | PRN
  Administered 2016-09-21 – 2016-09-24 (×5): 500 mg via ORAL
  Filled 2016-09-21 (×5): qty 1

## 2016-09-21 MED ORDER — ONDANSETRON HCL 4 MG/2ML IJ SOLN
INTRAMUSCULAR | Status: DC | PRN
  Administered 2016-09-21: 4 mg via INTRAVENOUS

## 2016-09-21 MED ORDER — DEXTROSE 5 % IV SOLN
INTRAVENOUS | Status: DC | PRN
  Administered 2016-09-21: 50 ug/min via INTRAVENOUS

## 2016-09-21 MED ORDER — FENTANYL CITRATE (PF) 100 MCG/2ML IJ SOLN
INTRAMUSCULAR | Status: AC
  Filled 2016-09-21: qty 2

## 2016-09-21 MED ORDER — SODIUM CHLORIDE 0.9 % IJ SOLN
INTRAMUSCULAR | Status: DC | PRN
  Administered 2016-09-21: 30 mL

## 2016-09-21 MED ORDER — HYDROMORPHONE HCL 2 MG PO TABS
2.0000 mg | ORAL_TABLET | ORAL | Status: DC | PRN
  Administered 2016-09-21 – 2016-09-22 (×3): 4 mg via ORAL
  Filled 2016-09-21 (×3): qty 2

## 2016-09-21 MED ORDER — ACETAMINOPHEN 325 MG PO TABS: 650.0000 mg | ORAL_TABLET | Freq: Four times a day (QID) | ORAL | Status: DC | PRN

## 2016-09-21 MED ORDER — METHOCARBAMOL 1000 MG/10ML IJ SOLN
500.0000 mg | Freq: Four times a day (QID) | INTRAVENOUS | Status: DC | PRN
  Filled 2016-09-21: qty 5

## 2016-09-21 MED ORDER — MENTHOL 3 MG MT LOZG: 1.0000 | LOZENGE | OROMUCOSAL | Status: DC | PRN

## 2016-09-21 MED ORDER — BISACODYL 10 MG RE SUPP: 10.0000 mg | Freq: Every day | RECTAL | Status: DC | PRN

## 2016-09-21 MED ORDER — ONDANSETRON HCL 4 MG PO TABS
4.0000 mg | ORAL_TABLET | Freq: Four times a day (QID) | ORAL | Status: DC | PRN
  Administered 2016-09-22 – 2016-09-23 (×2): 4 mg via ORAL
  Filled 2016-09-21 (×2): qty 1

## 2016-09-21 MED ORDER — TRANEXAMIC ACID 1000 MG/10ML IV SOLN
1000.0000 mg | INTRAVENOUS | Status: AC
  Administered 2016-09-21: 1000 mg via INTRAVENOUS
  Filled 2016-09-21: qty 1100

## 2016-09-21 MED ORDER — METOCLOPRAMIDE HCL 5 MG/ML IJ SOLN
5.0000 mg | Freq: Three times a day (TID) | INTRAMUSCULAR | Status: DC | PRN
  Administered 2016-09-22: 10 mg via INTRAVENOUS
  Filled 2016-09-21: qty 2

## 2016-09-21 MED ORDER — ACETAMINOPHEN 10 MG/ML IV SOLN
1000.0000 mg | Freq: Once | INTRAVENOUS | Status: AC
  Administered 2016-09-21: 1000 mg via INTRAVENOUS
  Filled 2016-09-21: qty 100

## 2016-09-21 MED ORDER — ONDANSETRON HCL 4 MG/2ML IJ SOLN
INTRAMUSCULAR | Status: AC
  Filled 2016-09-21: qty 2

## 2016-09-21 MED ORDER — TRANEXAMIC ACID 1000 MG/10ML IV SOLN
1000.0000 mg | Freq: Once | INTRAVENOUS | Status: DC
  Filled 2016-09-21: qty 10

## 2016-09-21 MED ORDER — DEXAMETHASONE SODIUM PHOSPHATE 10 MG/ML IJ SOLN
10.0000 mg | Freq: Once | INTRAMUSCULAR | Status: DC
  Filled 2016-09-21: qty 1

## 2016-09-21 MED ORDER — DOCUSATE SODIUM 100 MG PO CAPS
100.0000 mg | ORAL_CAPSULE | Freq: Two times a day (BID) | ORAL | Status: DC
  Administered 2016-09-21 – 2016-09-24 (×5): 100 mg via ORAL
  Filled 2016-09-21 (×6): qty 1

## 2016-09-21 MED ORDER — MIDAZOLAM HCL 2 MG/2ML IJ SOLN
INTRAMUSCULAR | Status: AC
  Filled 2016-09-21: qty 2

## 2016-09-21 MED ORDER — PROPOFOL 10 MG/ML IV BOLUS
INTRAVENOUS | Status: DC | PRN
  Administered 2016-09-21: 20 mg via INTRAVENOUS

## 2016-09-21 MED ORDER — PHENYLEPHRINE 40 MCG/ML (10ML) SYRINGE FOR IV PUSH (FOR BLOOD PRESSURE SUPPORT)
PREFILLED_SYRINGE | INTRAVENOUS | Status: AC
  Filled 2016-09-21: qty 10

## 2016-09-21 MED ORDER — DIPHENHYDRAMINE HCL 12.5 MG/5ML PO ELIX
12.5000 mg | ORAL_SOLUTION | ORAL | Status: DC | PRN
  Administered 2016-09-23: 12.5 mg via ORAL
  Filled 2016-09-21: qty 5

## 2016-09-21 MED ORDER — SODIUM CHLORIDE 0.9 % IV SOLN
INTRAVENOUS | Status: DC
  Administered 2016-09-21 – 2016-09-22 (×2): via INTRAVENOUS

## 2016-09-21 MED ORDER — FLEET ENEMA 7-19 GM/118ML RE ENEM: 1.0000 | ENEMA | Freq: Once | RECTAL | Status: DC | PRN

## 2016-09-21 MED ORDER — LACTATED RINGERS IV SOLN
INTRAVENOUS | Status: DC
  Administered 2016-09-21 (×2): via INTRAVENOUS

## 2016-09-21 MED ORDER — BUPROPION HCL ER (XL) 300 MG PO TB24
300.0000 mg | ORAL_TABLET | Freq: Every day | ORAL | Status: DC
  Administered 2016-09-23 – 2016-09-24 (×2): 300 mg via ORAL
  Filled 2016-09-21 (×4): qty 1

## 2016-09-21 MED ORDER — MIDAZOLAM HCL 5 MG/ML IJ SOLN
2.0000 mg | Freq: Once | INTRAMUSCULAR | Status: AC
  Administered 2016-09-21: 2 mg via INTRAVENOUS

## 2016-09-21 MED ORDER — VANCOMYCIN HCL IN DEXTROSE 1-5 GM/200ML-% IV SOLN
1000.0000 mg | Freq: Two times a day (BID) | INTRAVENOUS | Status: AC
  Administered 2016-09-21: 23:00:00 1000 mg via INTRAVENOUS
  Filled 2016-09-21: qty 200

## 2016-09-21 MED ORDER — ACETAMINOPHEN 650 MG RE SUPP: 650.0000 mg | Freq: Four times a day (QID) | RECTAL | Status: DC | PRN

## 2016-09-21 MED ORDER — PHENOL 1.4 % MT LIQD
1.0000 | OROMUCOSAL | Status: DC | PRN
  Filled 2016-09-21: qty 177

## 2016-09-21 MED ORDER — PHENYLEPHRINE HCL 10 MG/ML IJ SOLN
INTRAMUSCULAR | Status: DC | PRN
  Administered 2016-09-21: 80 ug via INTRAVENOUS
  Administered 2016-09-21: 40 ug via INTRAVENOUS
  Administered 2016-09-21: 80 ug via INTRAVENOUS
  Administered 2016-09-21: 40 ug via INTRAVENOUS
  Administered 2016-09-21: 120 ug via INTRAVENOUS
  Administered 2016-09-21 (×3): 80 ug via INTRAVENOUS
  Administered 2016-09-21: 40 ug via INTRAVENOUS

## 2016-09-21 MED ORDER — ACETAMINOPHEN 500 MG PO TABS
1000.0000 mg | ORAL_TABLET | Freq: Four times a day (QID) | ORAL | Status: AC
  Administered 2016-09-21 – 2016-09-22 (×3): 1000 mg via ORAL
  Filled 2016-09-21 (×3): qty 2

## 2016-09-21 MED ORDER — SODIUM CHLORIDE 0.9 % IJ SOLN
INTRAMUSCULAR | Status: AC
  Filled 2016-09-21: qty 50

## 2016-09-21 MED ORDER — ONDANSETRON HCL 4 MG/2ML IJ SOLN
4.0000 mg | Freq: Four times a day (QID) | INTRAMUSCULAR | Status: DC | PRN
  Administered 2016-09-21: 4 mg via INTRAVENOUS
  Filled 2016-09-21 (×2): qty 2

## 2016-09-21 MED ORDER — CEFAZOLIN SODIUM-DEXTROSE 2-4 GM/100ML-% IV SOLN
2.0000 g | INTRAVENOUS | Status: AC
  Administered 2016-09-21: 2 g via INTRAVENOUS
  Filled 2016-09-21: qty 100

## 2016-09-21 MED ORDER — DEXAMETHASONE SODIUM PHOSPHATE 10 MG/ML IJ SOLN
INTRAMUSCULAR | Status: AC
  Filled 2016-09-21: qty 1

## 2016-09-21 MED ORDER — METOCLOPRAMIDE HCL 5 MG PO TABS: 5.0000 mg | ORAL_TABLET | Freq: Three times a day (TID) | ORAL | Status: DC | PRN

## 2016-09-21 MED ORDER — BUPIVACAINE LIPOSOME 1.3 % IJ SUSP
20.0000 mL | Freq: Once | INTRAMUSCULAR | Status: DC
  Filled 2016-09-21: qty 20

## 2016-09-21 MED ORDER — HYDROMORPHONE HCL 1 MG/ML IJ SOLN
0.5000 mg | INTRAMUSCULAR | Status: DC | PRN
  Administered 2016-09-21 – 2016-09-23 (×4): 0.5 mg via INTRAVENOUS
  Filled 2016-09-21 (×4): qty 0.5

## 2016-09-21 MED ORDER — DEXAMETHASONE SODIUM PHOSPHATE 10 MG/ML IJ SOLN
10.0000 mg | Freq: Once | INTRAMUSCULAR | Status: AC
  Administered 2016-09-21: 10 mg via INTRAVENOUS

## 2016-09-21 MED ORDER — MIDAZOLAM HCL 2 MG/2ML IJ SOLN
INTRAMUSCULAR | Status: AC
Start: 2016-09-21 — End: 2016-09-21
  Filled 2016-09-21: qty 2

## 2016-09-21 MED ORDER — BUPIVACAINE LIPOSOME 1.3 % IJ SUSP
INTRAMUSCULAR | Status: DC | PRN
  Administered 2016-09-21: 20 mL

## 2016-09-21 MED ORDER — PHENYLEPHRINE HCL 10 MG/ML IJ SOLN
INTRAMUSCULAR | Status: AC
  Filled 2016-09-21: qty 1

## 2016-09-21 MED ORDER — FENTANYL CITRATE (PF) 100 MCG/2ML IJ SOLN
50.0000 ug | Freq: Once | INTRAMUSCULAR | Status: AC
  Administered 2016-09-21: 50 ug via INTRAVENOUS

## 2016-09-21 MED ORDER — PROPOFOL 10 MG/ML IV BOLUS
INTRAVENOUS | Status: AC
  Filled 2016-09-21: qty 60

## 2016-09-21 SURGICAL SUPPLY — 55 items
BAG DECANTER FOR FLEXI CONT (MISCELLANEOUS) IMPLANT
BAG SPEC THK2 15X12 ZIP CLS (MISCELLANEOUS) ×1
BAG ZIPLOCK 12X15 (MISCELLANEOUS) ×3 IMPLANT
BANDAGE ACE 6X5 VEL STRL LF (GAUZE/BANDAGES/DRESSINGS) ×3 IMPLANT
BLADE SAG 18X100X1.27 (BLADE) ×3 IMPLANT
BLADE SAW SGTL 11.0X1.19X90.0M (BLADE) ×3 IMPLANT
BOWL SMART MIX CTS (DISPOSABLE) ×3 IMPLANT
CAP KNEE TOTAL 3 SIGMA ×2 IMPLANT
CEMENT HV SMART SET (Cement) ×6 IMPLANT
CLOSURE WOUND 1/2 X4 (GAUZE/BANDAGES/DRESSINGS) ×2
CLOTH BEACON ORANGE TIMEOUT ST (SAFETY) ×3 IMPLANT
CUFF TOURN SGL QUICK 34 (TOURNIQUET CUFF) ×3
CUFF TRNQT CYL 34X4X40X1 (TOURNIQUET CUFF) ×1 IMPLANT
DECANTER SPIKE VIAL GLASS SM (MISCELLANEOUS) ×3 IMPLANT
DRAPE U-SHAPE 47X51 STRL (DRAPES) ×3 IMPLANT
DRSG ADAPTIC 3X8 NADH LF (GAUZE/BANDAGES/DRESSINGS) ×3 IMPLANT
DRSG PAD ABDOMINAL 8X10 ST (GAUZE/BANDAGES/DRESSINGS) ×3 IMPLANT
DURAPREP 26ML APPLICATOR (WOUND CARE) ×3 IMPLANT
ELECT REM PT RETURN 9FT ADLT (ELECTROSURGICAL) ×3
ELECTRODE REM PT RTRN 9FT ADLT (ELECTROSURGICAL) ×1 IMPLANT
EVACUATOR 1/8 PVC DRAIN (DRAIN) ×3 IMPLANT
GAUZE SPONGE 4X4 12PLY STRL (GAUZE/BANDAGES/DRESSINGS) ×3 IMPLANT
GLOVE BIO SURGEON STRL SZ7.5 (GLOVE) IMPLANT
GLOVE BIO SURGEON STRL SZ8 (GLOVE) ×3 IMPLANT
GLOVE BIOGEL PI IND STRL 6.5 (GLOVE) IMPLANT
GLOVE BIOGEL PI IND STRL 8 (GLOVE) ×1 IMPLANT
GLOVE BIOGEL PI INDICATOR 6.5 (GLOVE) ×2
GLOVE BIOGEL PI INDICATOR 8 (GLOVE) ×2
GLOVE SURG SS PI 6.5 STRL IVOR (GLOVE) ×2 IMPLANT
GOWN STRL REUS W/TWL LRG LVL3 (GOWN DISPOSABLE) ×5 IMPLANT
GOWN STRL REUS W/TWL XL LVL3 (GOWN DISPOSABLE) IMPLANT
HANDPIECE INTERPULSE COAX TIP (DISPOSABLE) ×3
IMMOBILIZER KNEE 20 (SOFTGOODS) ×3
IMMOBILIZER KNEE 20 THIGH 36 (SOFTGOODS) ×1 IMPLANT
IMMOBILIZER KNEE 22 (SOFTGOODS) ×2 IMPLANT
MANIFOLD NEPTUNE II (INSTRUMENTS) ×3 IMPLANT
NS IRRIG 1000ML POUR BTL (IV SOLUTION) ×3 IMPLANT
PACK TOTAL KNEE CUSTOM (KITS) ×3 IMPLANT
PADDING CAST COTTON 6X4 STRL (CAST SUPPLIES) ×7 IMPLANT
POSITIONER SURGICAL ARM (MISCELLANEOUS) ×3 IMPLANT
SET HNDPC FAN SPRY TIP SCT (DISPOSABLE) ×1 IMPLANT
STRIP CLOSURE SKIN 1/2X4 (GAUZE/BANDAGES/DRESSINGS) ×4 IMPLANT
SUT MNCRL AB 4-0 PS2 18 (SUTURE) ×3 IMPLANT
SUT STRATAFIX 0 PDS 27 VIOLET (SUTURE) ×3
SUT VIC AB 2-0 CT1 27 (SUTURE) ×9
SUT VIC AB 2-0 CT1 TAPERPNT 27 (SUTURE) ×3 IMPLANT
SUT VLOC 180 0 24IN GS25 (SUTURE) IMPLANT
SUTURE STRATFX 0 PDS 27 VIOLET (SUTURE) ×1 IMPLANT
SYR 50ML LL SCALE MARK (SYRINGE) IMPLANT
TRAY FOLEY CATH 14FRSI W/METER (CATHETERS) ×2 IMPLANT
TRAY FOLEY W/METER SILVER 16FR (SET/KITS/TRAYS/PACK) IMPLANT
TRAY REVISION SZ 3 (Knees) ×3 IMPLANT
WATER STERILE IRR 1000ML POUR (IV SOLUTION) ×6 IMPLANT
WRAP KNEE MAXI GEL POST OP (GAUZE/BANDAGES/DRESSINGS) ×3 IMPLANT
YANKAUER SUCT BULB TIP 10FT TU (MISCELLANEOUS) ×3 IMPLANT

## 2016-09-21 NOTE — H&P (View-Only) (Signed)
Anne Hogan DOB: 12/07/1945 Married / Language: English / Race: White Female Date of Admission:  09/21/2016 CC:  Right knee pain History of Present Illness The patient is a 71 year old female who comes in for a preoperative History and Physical. The patient is scheduled for a right total knee arthroplasty to be performed by Dr. Dione Plover. Aluisio, MD at [facility] on 09-21-2016. The patient is a 71 year old female who presented for follow up of their knee. The patient is being followed for their right tibial plateau fx. They are now several months out from injury (fall). Symptoms reported include: pain. The patient feels that they are doing well and report their pain level to be moderate (pain varies). Current treatment includes: home exercise program and modified weightbearing (touch down weightbearing/using walker/wheelchair). The following medication has been used for pain control: none. Anne Hogan has been getting prepared for her upcoming right total knee arthroplasty for some time now. She feels like she has gotten a lot of motion back in the knee now. She had a bad tibial plateau fracture but is not having pain from the fracture. She is just having her arthritic pain now. She is ready to proceed with the knee replacement at this time. They have been treated conservatively in the past for the above stated problem and despite conservative measures, they continue to have progressive pain and severe functional limitations and dysfunction. They have completed her non-operative management of the the fracture to include bracing. It is felt that they would benefit from undergoing total joint replacement. Risks and benefits of the procedure have been discussed with the patient and they elect to proceed with surgery. There are no active contraindications to surgery such as ongoing infection or rapidly progressive neurological disease.  Problem List/Past Medical  Closed fracture of right tibial  plateau Depression  Gastroesophageal Reflux Disease  Migraine Headache  Osteoporosis  Sleep Apnea  Tinnitus Heart murmur  Mitral Valve Prolapse  Varicose veins  Hemorrhoids  Menopause  Cellulitis  Past History Measles  Mumps  Osteopenia  Allergies Codeine Sulfate *ANALGESICS - OPIOID*  PenicillAMINE *Miscellaneous Therapeutic Classes  Rash.  Family History Congestive Heart Failure  Father. Diabetes Mellitus  Brother, Father, Maternal Grandmother. First Degree Relatives  reported Heart disease in female family member before age 67   Social History  Children  0 Current drinker  06/23/2016: Currently drinks beer and wine only occasionally per week Current work status  retired Exercise  Exercises rarely Living situation  live with spouse Marital status  married No history of drug/alcohol rehab  Not under pain contract  Number of flights of stairs before winded  1 Tobacco / smoke exposure  06/23/2016: no Tobacco use  Former smoker. 06/23/2016: smoke(d) 1 1/2 pack(s) per day  Medication History  Multiple Vitamin (1 (one) Oral) Active. Calcium Citrate ('200MG'$  Tablet, 1 (one) Oral) Active. BuPROPion HCl ER (XL) ('300MG'$  Tablet ER 24HR, Oral) Active. Vitamin D (Ergocalciferol) (50000UNIT Capsule, Oral) Active.  Past Surgical History Arthroscopy of Knee  right Breast Biopsy  right Cataract Surgery  bilateral Colon Polyp Removal - Colonoscopy  Gallbladder Surgery  laporoscopic    Review of Systems General Not Present- Chills, Fatigue, Fever, Memory Loss, Night Sweats, Weight Gain and Weight Loss. Skin Not Present- Eczema, Hives, Itching, Lesions and Rash. HEENT Present- Tinnitus. Not Present- Dentures, Double Vision, Headache, Hearing Loss and Visual Loss. Respiratory Not Present- Allergies, Chronic Cough, Coughing up blood, Shortness of breath at rest and Shortness of  breath with exertion. Cardiovascular Present- Palpitations. Not  Present- Chest Pain, Difficulty Breathing Lying Down, Murmur, Racing/skipping heartbeats and Swelling. Gastrointestinal Not Present- Abdominal Pain, Bloody Stool, Constipation, Diarrhea, Difficulty Swallowing, Heartburn, Jaundice, Loss of appetitie, Nausea and Vomiting. Female Genitourinary Not Present- Blood in Urine, Discharge, Flank Pain, Incontinence, Painful Urination, Urgency, Urinary frequency, Urinary Retention, Urinating at Night and Weak urinary stream. Musculoskeletal Present- Joint Pain. Not Present- Back Pain, Joint Swelling, Morning Stiffness, Muscle Pain, Muscle Weakness and Spasms. Neurological Not Present- Blackout spells, Difficulty with balance, Dizziness, Paralysis, Tremor and Weakness. Psychiatric Not Present- Insomnia.  Vitals  Weight: 165 lb Height: 62.75in Weight was reported by patient. Height was reported by patient. Body Surface Area: 1.78 m Body Mass Index: 29.46 kg/m  Pulse: 76 (Regular)  BP: 118/64 (Sitting, Right Arm, Standard)  Physical Exam General Mental Status -Alert, cooperative and good historian. General Appearance-pleasant, Not in acute distress. Orientation-Oriented X3. Build & Nutrition-Well nourished and Well developed.  Head and Neck Head-normocephalic, atraumatic . Neck Global Assessment - supple, no bruit auscultated on the right, no bruit auscultated on the left.  Eye Pupil - Bilateral-Regular and Round. Motion - Bilateral-EOMI.  Chest and Lung Exam Auscultation Breath sounds - clear at anterior chest wall and clear at posterior chest wall. Adventitious sounds - No Adventitious sounds.  Cardiovascular Auscultation Rhythm - Regular rate and rhythm. Heart Sounds - S1 WNL and S2 WNL. Murmurs & Other Heart Sounds - Auscultation of the heart reveals - No Murmurs.  Abdomen Palpation/Percussion Tenderness - Abdomen is non-tender to palpation. Rigidity (guarding) - Abdomen is soft. Auscultation Auscultation of  the abdomen reveals - Bowel sounds normal.  Female Genitourinary Note: Not done, not pertinent to present illness   Musculoskeletal Note: On exam, she is in no distress. Her right knee shows no swelling. She is still tender laterally. She can flex about 40 with full extension. It is the first time she is flexed the knee since she has fractured the tibial plateau.  Her x-rays show no change in position of this tibial plateau fracture. She has got the depressed fragment laterally, but it looks as though everything else has stayed intact.   Assessment & Plan Closed fracture of right tibial plateau  Traumatic Arthritis Right knee  Note:Surgical Plans: Right Total Knee Replacement  Disposition: Home, HHPT following the hospital stay ([place] - already been seen by Leroy Kennedy)  PCP: Dr. Dagmar Hait Cards: Dr. Curt Bears  IV TXA  Anesthesia Issues: None  Patient was instructed on what medications to stop prior to surgery.  Signed electronically by Ok Edwards, III PA-C

## 2016-09-21 NOTE — Anesthesia Procedure Notes (Signed)
Spinal  Patient location during procedure: OR Start time: 09/21/2016 11:40 AM End time: 09/21/2016 11:50 AM Staffing Anesthesiologist: Belinda Block Performed: anesthesiologist  Preanesthetic Checklist Completed: patient identified, site marked, surgical consent, pre-op evaluation, timeout performed, IV checked, risks and benefits discussed and monitors and equipment checked Spinal Block Patient position: sitting Prep: DuraPrep Approach: midline Location: L3-4 Needle Needle type: Quincke  Needle gauge: 22 G Assessment Sensory level: T10 Additional Notes Spinal marcaine '13mg'$ . Expiration checked.neg asp Patient tolerated procedure well. Patient without complaints.

## 2016-09-21 NOTE — Interval H&P Note (Signed)
History and Physical Interval Note:  09/21/2016 11:30 AM  Anne Hogan  has presented today for surgery, with the diagnosis of Osteoarthritis Right knee  The various methods of treatment have been discussed with the patient and family. After consideration of risks, benefits and other options for treatment, the patient has consented to  Procedure(s) with comments: RIGHT TOTAL KNEE ARTHROPLASTY (Right) - requests 56mns as a surgical intervention .  The patient's history has been reviewed, patient examined, no change in status, stable for surgery.  I have reviewed the patient's chart and labs.  Questions were answered to the patient's satisfaction.     AGearlean Alf

## 2016-09-21 NOTE — Anesthesia Preprocedure Evaluation (Signed)
Anesthesia Evaluation  Patient identified by MRN, date of birth, ID band Patient awake    Reviewed: Allergy & Precautions, NPO status , Patient's Chart, lab work & pertinent test results  Airway Mallampati: II  TM Distance: >3 FB     Dental   Pulmonary sleep apnea , former smoker,    breath sounds clear to auscultation       Cardiovascular negative cardio ROS   Rhythm:Regular Rate:Normal     Neuro/Psych  Headaches,    GI/Hepatic Neg liver ROS, GERD  ,  Endo/Other  negative endocrine ROS  Renal/GU negative Renal ROS     Musculoskeletal  (+) Arthritis ,   Abdominal   Peds  Hematology   Anesthesia Other Findings   Reproductive/Obstetrics                             Anesthesia Physical Anesthesia Plan  ASA: III  Anesthesia Plan: Spinal   Post-op Pain Management:  Regional for Post-op pain   Induction:   Airway Management Planned: Simple Face Mask  Additional Equipment:   Intra-op Plan:   Post-operative Plan:   Informed Consent: I have reviewed the patients History and Physical, chart, labs and discussed the procedure including the risks, benefits and alternatives for the proposed anesthesia with the patient or authorized representative who has indicated his/her understanding and acceptance.   Dental advisory given  Plan Discussed with: CRNA and Anesthesiologist  Anesthesia Plan Comments:         Anesthesia Quick Evaluation

## 2016-09-21 NOTE — Anesthesia Postprocedure Evaluation (Signed)
Anesthesia Post Note  Patient: Anne Hogan  Procedure(s) Performed: Procedure(s) (LRB): RIGHT TOTAL KNEE ARTHROPLASTY (Right)  Patient location during evaluation: PACU Anesthesia Type: Spinal Level of consciousness: awake Pain management: pain level controlled Vital Signs Assessment: post-procedure vital signs reviewed and stable Respiratory status: spontaneous breathing Cardiovascular status: stable Postop Assessment: spinal receding Anesthetic complications: no       Last Vitals:  Vitals:   09/21/16 1430 09/21/16 1445  BP: 106/62 114/63  Pulse: 63 64  Resp: 13 16  Temp: 36.3 C 36.7 C    Last Pain:  Vitals:   09/21/16 1430  TempSrc:   PainSc: 0-No pain                 Shields Pautz

## 2016-09-21 NOTE — Transfer of Care (Signed)
Immediate Anesthesia Transfer of Care Note  Patient: Anne Hogan  Procedure(s) Performed: Procedure(s) with comments: RIGHT TOTAL KNEE ARTHROPLASTY (Right) - requests 20mns with abductor block  Patient Location: PACU  Anesthesia Type:MAC, Regional and Spinal  Level of Consciousness: Patient easily awoken, sedated, comfortable, cooperative, following commands, responds to stimulation.   Airway & Oxygen Therapy: Patient spontaneously breathing, ventilating well, oxygen via simple oxygen mask.  Post-op Assessment: Report given to PACU RN, vital signs reviewed and stable.   Post vital signs: Reviewed and stable.  Complications: No apparent anesthesia complications  Last Vitals:  Vitals:   09/21/16 1134 09/21/16 1300  BP:    Pulse: 71   Resp: 14   Temp:  (P) 36.3 C    Last Pain:  Vitals:   09/21/16 0906  TempSrc: Oral         Complications: No apparent anesthesia complications

## 2016-09-21 NOTE — Progress Notes (Signed)
Assisted Dr Venia Carbon right, ultrasound guided, adductor canal block. Side rails up, monitors on throughout procedure. See vital signs in flow sheet. Tolerated Procedure well.

## 2016-09-21 NOTE — Op Note (Signed)
OPERATIVE REPORT-TOTAL KNEE ARTHROPLASTY   Pre-operative diagnosis- Osteoarthritis  Right knee(s)  Post-operative diagnosis- Osteoarthritis Right knee(s)  Procedure-  Right  Total Knee Arthroplasty  Surgeon- Dione Plover. Shalandria Elsbernd, MD  Assistant- Ardeen Jourdain, PA-C   Anesthesia-  Adductor canal block and spinal  EBL-* No blood loss amount entered *   Drains Hemovac  Tourniquet time-  Total Tourniquet Time Documented: Thigh (Right) - 32 minutes Total: Thigh (Right) - 32 minutes     Complications- None  Condition- Stable to PACU  Brief Clinical Note  Anne Hogan is a 71 y.o. year old female with end stage OA of her right knee with progressively worsening pain and dysfunction. She has constant pain, with activity and at rest and significant functional deficits with difficulties even with ADLs. She has had extensive non-op management including analgesics, injections of cortisone and viscosupplements, and home exercise program, but remains in significant pain with significant dysfunction. She had a recent lateral tibial plateau fracture which healed uneventfully Radiographs show bone on bone arthritis all 3 compartments. She presents now for right Total Knee Arthroplasty.    Procedure in detail---   The patient is brought into the operating room and positioned supine on the operating table. After successful administration of  Adductor canal block and spinal,   a tourniquet is placed high on the  Right thigh(s) and the lower extremity is prepped and draped in the usual sterile fashion. Time out is performed by the operating team and then the  Right lower extremity is wrapped in Esmarch, knee flexed and the tourniquet inflated to 300 mmHg.       A midline incision is made with a ten blade through the subcutaneous tissue to the level of the extensor mechanism. A fresh blade is used to make a medial parapatellar arthrotomy. Soft tissue over the proximal medial tibia is subperiosteally  elevated to the joint line with a knife and into the semimembranosus bursa with a Cobb elevator. Soft tissue over the proximal lateral tibia is elevated with attention being paid to avoiding the patellar tendon on the tibial tubercle. The patella is everted, knee flexed 90 degrees and the ACL and PCL are removed. Findings are bone on bone medial and patellofemoral with exposed bone laterally also.        The drill is used to create a starting hole in the distal femur and the canal is thoroughly irrigated with sterile saline to remove the fatty contents. The 5 degree Right  valgus alignment guide is placed into the femoral canal and the distal femoral cutting block is pinned to remove 10 mm off the distal femur. Resection is made with an oscillating saw.      The tibia is subluxed forward and the menisci are removed. The extramedullary alignment guide is placed referencing proximally at the medial aspect of the tibial tubercle and distally along the second metatarsal axis and tibial crest. The block is pinned to remove 34m off the more deficient medial  side. Resection is made with an oscillating saw. Size 3 is the most appropriate size for the tibia and the proximal tibia is prepared with the modular drill and keel punch for that size for the MBT revision tray, which I utilized due to the recent tibial plateau fracture.      The femoral sizing guide is placed and size 3 is most appropriate. Rotation is marked off the epicondylar axis and confirmed by creating a rectangular flexion gap at 90 degrees. The size 3  cutting block is pinned in this rotation and the anterior, posterior and chamfer cuts are made with the oscillating saw. The intercondylar block is then placed and that cut is made.      Trial size 3 tibial component, trial size 3 posterior stabilized femur and a 12.5  mm posterior stabilized rotating platform insert trial is placed. Full extension is achieved with excellent varus/valgus and  anterior/posterior balance throughout full range of motion. The patella is everted and thickness measured to be 22  mm. Free hand resection is taken to 12 mm, a 35 template is placed, lug holes are drilled, trial patella is placed, and it tracks normally. Osteophytes are removed off the posterior femur with the trial in place. All trials are removed and the cut bone surfaces prepared with pulsatile lavage. Cement is mixed and once ready for implantation, the size 3 MBT revision tibial implant, size  3 posterior stabilized femoral component, and the size 35 patella are cemented in place and the patella is held with the clamp. The trial insert is placed and the knee held in full extension. The Exparel (20 ml mixed with 30 ml saline) is injected into the extensor mechanism, posterior capsule, medial and lateral gutters and subcutaneous tissues.  All extruded cement is removed and once the cement is hard the permanent 35 mm posterior stabilized rotating platform insert is placed into the tibial tray.      The wound is copiously irrigated with saline solution and the extensor mechanism closed over a hemovac drain with #1 V-loc suture. The tourniquet is released for a total tourniquet time of 32  minutes. Flexion against gravity is 140 degrees and the patella tracks normally. Subcutaneous tissue is closed with 2.0 vicryl and subcuticular with running 4.0 Monocryl. The incision is cleaned and dried and steri-strips and a bulky sterile dressing are applied. The limb is placed into a knee immobilizer and the patient is awakened and transported to recovery in stable condition.      Please note that a surgical assistant was a medical necessity for this procedure in order to perform it in a safe and expeditious manner. Surgical assistant was necessary to retract the ligaments and vital neurovascular structures to prevent injury to them and also necessary for proper positioning of the limb to allow for anatomic placement of  the prosthesis.   Dione Plover Lam Mccubbins, MD    09/21/2016, 12:41 PM

## 2016-09-22 LAB — BASIC METABOLIC PANEL
ANION GAP: 6 (ref 5–15)
BUN: 11 mg/dL (ref 6–20)
CALCIUM: 8.6 mg/dL — AB (ref 8.9–10.3)
CO2: 26 mmol/L (ref 22–32)
Chloride: 107 mmol/L (ref 101–111)
Creatinine, Ser: 0.75 mg/dL (ref 0.44–1.00)
GFR calc non Af Amer: >60 mL/min (ref >60)
GLUCOSE: 132 mg/dL — AB (ref 65–99)
POTASSIUM: 4 mmol/L (ref 3.5–5.1)
Sodium: 139 mmol/L (ref 135–145)

## 2016-09-22 LAB — CBC
HEMATOCRIT: 32.6 % — AB (ref 36.0–46.0)
Hemoglobin: 10.6 g/dL — ABNORMAL LOW (ref 12.0–15.0)
MCH: 28.5 pg (ref 26.0–34.0)
MCHC: 32.5 g/dL (ref 30.0–36.0)
MCV: 87.6 fL (ref 78.0–100.0)
Platelets: 280 10*3/uL (ref 150–400)
RBC: 3.72 MIL/uL — ABNORMAL LOW (ref 3.87–5.11)
RDW: 12.7 % (ref 11.5–15.5)
WBC: 8.7 10*3/uL (ref 4.0–10.5)

## 2016-09-22 MED ORDER — METHOCARBAMOL 500 MG PO TABS: 500.0000 mg | ORAL_TABLET | Freq: Four times a day (QID) | ORAL | 0 refills | Status: DC | PRN

## 2016-09-22 MED ORDER — SODIUM CHLORIDE 0.9 % IV BOLUS (SEPSIS): 250.0000 mL | Freq: Once | INTRAVENOUS | Status: DC

## 2016-09-22 MED ORDER — RIVAROXABAN 10 MG PO TABS: 10.0000 mg | ORAL_TABLET | Freq: Every day | ORAL | 0 refills | Status: DC

## 2016-09-22 MED ORDER — HYDROCODONE-ACETAMINOPHEN 5-325 MG PO TABS
1.0000 | ORAL_TABLET | ORAL | Status: DC | PRN
  Administered 2016-09-22 – 2016-09-23 (×5): 2 via ORAL
  Filled 2016-09-22 (×5): qty 2

## 2016-09-22 MED ORDER — SODIUM CHLORIDE 0.9 % IV BOLUS (SEPSIS)
250.0000 mL | Freq: Once | INTRAVENOUS | Status: AC
  Administered 2016-09-22: 250 mL via INTRAVENOUS

## 2016-09-22 MED ORDER — PROMETHAZINE HCL 25 MG/ML IJ SOLN
6.2500 mg | Freq: Four times a day (QID) | INTRAMUSCULAR | Status: DC | PRN
  Administered 2016-09-22: 13:00:00 12.5 mg via INTRAVENOUS
  Filled 2016-09-22: qty 1

## 2016-09-22 MED ORDER — HYDROCODONE-ACETAMINOPHEN 5-325 MG PO TABS: 1.0000 | ORAL_TABLET | ORAL | 0 refills | Status: DC | PRN

## 2016-09-22 NOTE — Care Management Note (Signed)
Case Management Note  Patient Details  Name: NEKO MCGEEHAN MRN: [id number] Date of Birth: 06-23-1946  Subjective/Objective:                  RIGHT TOTAL KNEE ARTHROPLASTY (Right) Action/Plan: Discharge planning Expected Discharge Date:                  Expected Discharge Plan:  [place]  In-House Referral:     Discharge planning Services  CM Consult  Post Acute Care Choice:  Home Health Choice offered to:  Patient  DME Arranged:  N/A DME Agency:  NA  HH Arranged:  PT [place] Agency:  [place]  Status of Service:  Completed, signed off  If discussed at [place] of Stay Meetings, dates discussed:    Additional Comments: CM met with pt in room to offer choice of home health agency. Pt chooses [facility] of [facility] to render HHPT.  Referral called to [facility] rep, Joelene Millin with request for [place].  Pt states she has all DME needed at home. No other CM needs were communicated. Dellie Catholic, RN 09/22/2016, 11:30 AM

## 2016-09-22 NOTE — Progress Notes (Signed)
   Subjective: 1 Day Post-Op Procedure(s) (LRB): RIGHT TOTAL KNEE ARTHROPLASTY (Right) Patient reports pain as mild and moderate.   Patient seen in rounds for Dr. Wynelle Link. Patient is well, but has had some minor complaints of pain in the knee, requiring pain medications We will start therapy today.  Plan is to go Home after hospital stay.  Objective: Vital signs in last 24 hours: Temp:  [97.4 F (36.3 C)-98.2 F (36.8 C)] 97.9 F (36.6 C) (03/06 1801) Pulse Rate:  [57-60] 58 (03/06 1801) Resp:  [8-17] 16 (03/06 1801) BP: (92-114)/(49-63) 104/53 (03/06 1801) SpO2:  [90 %-100 %] 98 % (03/06 1801)  Intake/Output from previous day:  Intake/Output Summary (Last 24 hours) at 09/22/16 1958 Last data filed at 09/22/16 1954  Gross per 24 hour  Intake             1515 ml  Output             1795 ml  Net             -280 ml    Intake/Output this shift: Total I/O In: -  Out: 300 [Urine:300]  Labs:  Recent Labs  09/22/16 0508  HGB 10.6*    Recent Labs  09/22/16 0508  WBC 8.7  RBC 3.72*  HCT 32.6*  PLT 280    Recent Labs  09/22/16 0508  NA 139  K 4.0  CL 107  CO2 26  BUN 11  CREATININE 0.75  GLUCOSE 132*  CALCIUM 8.6*   No results for input(s): LABPT, INR in the last 72 hours.  EXAM General - Patient is Alert, Appropriate and Oriented Extremity - Neurovascular intact Sensation intact distally Intact pulses distally Dorsiflexion/Plantar flexion intact Dressing - dressing C/D/I Motor Function - intact, moving foot and toes well on exam.  Hemovac pulled without difficulty.  Past Medical History:  Diagnosis Date  . ADD (attention deficit disorder)   . Arthritis    "right knee" (04/19/2013)  . Claustrophobia   . Depression   . Fracture of right tibial plateau 05/2016  . GERD (gastroesophageal reflux disease)   . Heart murmur   . Migraine    "bad when I was in 5th or 6th grade; I can usually ward them off by resting my head now" (04/19/2013)  . MVP  (mitral valve prolapse)   . Sleep apnea    has not used mouth guard in 5 years no cpap used   . Squamous carcinoma ?1999   "upper left groin" (04/19/2013)    Assessment/Plan: 1 Day Post-Op Procedure(s) (LRB): RIGHT TOTAL KNEE ARTHROPLASTY (Right) Principal Problem:   OA (osteoarthritis) of knee  Estimated body mass index is 29.17 kg/m as calculated from the following:   Height as of this encounter: 5' 2.75" (1.594 m).   Weight as of this encounter: 74.1 kg (163 lb 6 oz). Up with therapy Plan for discharge tomorrow Discharge home with home health  DVT Prophylaxis - Xarelto Weight-Bearing as tolerated to right leg D/C O2 and Pulse OX and try on Room Air  Arlee Muslim, PA-C Orthopaedic Surgery 09/22/2016, 7:58 PM

## 2016-09-22 NOTE — Evaluation (Signed)
Physical Therapy Evaluation Patient Details Name: Anne Hogan MRN: [id number] DOB: 03-22-1946 Today's Date: 09/22/2016   History of Present Illness  Pt is a 71 year old female s/p R TKA due to fracture of R tibial plateau in Nov 2017 and arthritis  Clinical Impression  Pt is s/p TKA resulting in the deficits listed below (see PT Problem List).  Pt will benefit from skilled PT to increase their independence and safety with mobility to allow discharge to the venue listed below.  Pt with low BP and nausea at times so remained in bed and performed exercises as pt eager to start therapy.    Follow Up Recommendations Home health PT    Equipment Recommendations  None recommended by PT    Recommendations for Other Services       Precautions / Restrictions Precautions Precautions: Fall;Knee Required Braces or Orthoses: Knee Immobilizer - Right Restrictions Other Position/Activity Restrictions: WBAT      Mobility  Bed Mobility Overal bed mobility: Needs Assistance             General bed mobility comments: pt with nausea this morning, states sitting up makes worse, also BP very low so did not mobilize  Transfers                    Ambulation/Gait                Stairs            Wheelchair Mobility    Modified Rankin (Stroke Patients Only)       Balance                                             Pertinent Vitals/Pain Pain Assessment: 0-10 Pain Score: 4  Pain Location: R knee Pain Descriptors / Indicators: Aching;Sore Pain Intervention(s): Monitored during session;Limited activity within patient's tolerance;Repositioned  Presession: BP 89/50 mmHg HR 53 bpm  Postsession: BP 90/55 mmHg HR 55 bpm    Home Living Family/patient expects to be discharged to:: Private residence Living Arrangements: Spouse/significant other   Type of Home: House Home Access: Stairs to enter   [employer] of Steps: 2 Home Layout:  One level Home Equipment: Environmental consultant - 2 wheels;Wheelchair - manual Additional Comments: spouse is currently in hospital    Prior Function Level of Independence: Independent with assistive device(s)               Hand Dominance        Extremity/Trunk Assessment        Lower Extremity Assessment Lower Extremity Assessment: RLE deficits/detail RLE Deficits / Details: able to perform SLR, AAROM knee flexion approx 35*       Communication   Communication: No difficulties  Cognition Arousal/Alertness: Awake/alert Behavior During Therapy: WFL for tasks assessed/performed Overall Cognitive Status: Within Functional Limits for tasks assessed                      General Comments      Exercises Total Joint Exercises Ankle Circles/Pumps: AROM;10 reps;Both Quad Sets: AROM;Right;10 reps Short Arc Quad: AROM;Right;10 reps Heel Slides: AAROM;Right;10 reps Hip ABduction/ADduction: AROM;Right;10 reps Straight Leg Raises: AROM;Right;10 reps   Assessment/Plan    PT Assessment Patient needs continued PT services  PT Problem List Decreased strength;Decreased mobility;Decreased range of motion;Decreased knowledge of use of DME;Pain  PT Treatment Interventions Gait training;DME instruction;Therapeutic activities;Therapeutic exercise;Functional mobility training;Patient/family education;Stair training    PT Goals (Current goals can be found in the Care Plan section)  Acute Rehab PT Goals PT Goal Formulation: With patient Time For Goal Achievement: 09/26/16 Potential to Achieve Goals: Good    Frequency 7X/week   Barriers to discharge        Co-evaluation               End of Session   Activity Tolerance: Treatment limited secondary to medical complications (Comment) (low BP) Patient left: in bed;with call bell/phone within reach   PT Visit Diagnosis: Other abnormalities of gait and mobility (R26.89)         Time: 3244-0102 PT Time Calculation  (min) (ACUTE ONLY): 16 min   Charges:   PT Evaluation $PT Eval Low Complexity: 1 Procedure     PT G Codes:         Iveliz Garay,KATHrine E 09/22/2016, 12:56 PM Carmelia Bake, PT, DPT 09/22/2016 Pager: [phone]

## 2016-09-22 NOTE — Discharge Summary (Signed)
Physician Discharge Summary   Patient ID: Anne Hogan MRN: [id number] DOB/AGE: 1946/06/11 71 y.o.  Admit date: 09/21/2016 Discharge date: 09/24/2016  Primary Diagnosis:  Osteoarthritis  Right knee(s)  Admission Diagnoses:  Past Medical History:  Diagnosis Date  . ADD (attention deficit disorder)   . Arthritis    "right knee" (04/19/2013)  . Claustrophobia   . Depression   . Fracture of right tibial plateau 05/2016  . GERD (gastroesophageal reflux disease)   . Heart murmur   . Migraine    "bad when I was in 5th or 6th grade; I can usually ward them off by resting my head now" (04/19/2013)  . MVP (mitral valve prolapse)   . Sleep apnea    has not used mouth guard in 5 years no cpap used   . Squamous carcinoma ?1999   "upper left groin" (04/19/2013)   Discharge Diagnoses:   Principal Problem:   OA (osteoarthritis) of knee  Estimated body mass index is 29.17 kg/m as calculated from the following:   Height as of this encounter: 5' 2.75" (1.594 m).   Weight as of this encounter: 74.1 kg (163 lb 6 oz).  Procedure:  Procedure(s) (LRB): RIGHT TOTAL KNEE ARTHROPLASTY (Right)   Consults: None  HPI: Anne Hogan is a 71 y.o. year old female with end stage OA of her right knee with progressively worsening pain and dysfunction. She has constant pain, with activity and at rest and significant functional deficits with difficulties even with ADLs. She has had extensive non-op management including analgesics, injections of cortisone and viscosupplements, and home exercise program, but remains in significant pain with significant dysfunction. She had a recent lateral tibial plateau fracture which healed uneventfully Radiographs show bone on bone arthritis all 3 compartments. She presents now for right Total Knee Arthroplasty.  Laboratory Data: Admission on 09/21/2016  Component Date Value Ref Range Status  . WBC 09/22/2016 8.7  4.0 - 10.5 K/uL Final  . RBC 09/22/2016 3.72* 3.87 - 5.11 MIL/uL  Final  . Hemoglobin 09/22/2016 10.6* 12.0 - 15.0 g/dL Final  . HCT 09/22/2016 32.6* 36.0 - 46.0 % Final  . MCV 09/22/2016 87.6  78.0 - 100.0 fL Final  . MCH 09/22/2016 28.5  26.0 - 34.0 pg Final  . MCHC 09/22/2016 32.5  30.0 - 36.0 g/dL Final  . RDW 09/22/2016 12.7  11.5 - 15.5 % Final  . Platelets 09/22/2016 280  150 - 400 K/uL Final  . Sodium 09/22/2016 139  135 - 145 mmol/L Final  . Potassium 09/22/2016 4.0  3.5 - 5.1 mmol/L Final  . Chloride 09/22/2016 107  101 - 111 mmol/L Final  . CO2 09/22/2016 26  22 - 32 mmol/L Final  . Glucose, Bld 09/22/2016 132* 65 - 99 mg/dL Final  . BUN 09/22/2016 11  6 - 20 mg/dL Final  . Creatinine, Ser 09/22/2016 0.75  0.44 - 1.00 mg/dL Final  . Calcium 09/22/2016 8.6* 8.9 - 10.3 mg/dL Final  . GFR calc non Af Amer 09/22/2016 >60  >60 mL/min Final  . GFR calc Af Amer 09/22/2016 >60  >60 mL/min Final   Comment: (NOTE) The eGFR has been calculated using the CKD EPI equation. This calculation has not been validated in all clinical situations. eGFR's persistently <60 mL/min signify possible Chronic Kidney Disease.   . Anion gap 09/22/2016 6  5 - 15 Final  . WBC 09/23/2016 7.0  4.0 - 10.5 K/uL Final  . RBC 09/23/2016 3.38* 3.87 - 5.11 MIL/uL Final  .  Hemoglobin 09/23/2016 9.7* 12.0 - 15.0 g/dL Final  . HCT 76/55/8709 29.9* 36.0 - 46.0 % Final  . MCV 09/23/2016 88.5  78.0 - 100.0 fL Final  . MCH 09/23/2016 28.7  26.0 - 34.0 pg Final  . MCHC 09/23/2016 32.4  30.0 - 36.0 g/dL Final  . RDW 29/41/6508 12.9  11.5 - 15.5 % Final  . Platelets 09/23/2016 230  150 - 400 K/uL Final  . Sodium 09/23/2016 141  135 - 145 mmol/L Final  . Potassium 09/23/2016 3.7  3.5 - 5.1 mmol/L Final  . Chloride 09/23/2016 111  101 - 111 mmol/L Final  . CO2 09/23/2016 26  22 - 32 mmol/L Final  . Glucose, Bld 09/23/2016 97  65 - 99 mg/dL Final  . BUN 13/65/3113 13  6 - 20 mg/dL Final  . Creatinine, Ser 09/23/2016 0.93  0.44 - 1.00 mg/dL Final  . Calcium 94/91/4865 8.5* 8.9 -  10.3 mg/dL Final  . GFR calc non Af Amer 09/23/2016 >60  >60 mL/min Final  . GFR calc Af Amer 09/23/2016 >60  >60 mL/min Final   Comment: (NOTE) The eGFR has been calculated using the CKD EPI equation. This calculation has not been validated in all clinical situations. eGFR's persistently <60 mL/min signify possible Chronic Kidney Disease.   . Anion gap 09/23/2016 4* 5 - 15 Final  . WBC 09/24/2016 5.3  4.0 - 10.5 K/uL Final  . RBC 09/24/2016 3.26* 3.87 - 5.11 MIL/uL Final  . Hemoglobin 09/24/2016 9.4* 12.0 - 15.0 g/dL Final  . HCT 46/89/8389 28.7* 36.0 - 46.0 % Final  . MCV 09/24/2016 88.0  78.0 - 100.0 fL Final  . MCH 09/24/2016 28.8  26.0 - 34.0 pg Final  . MCHC 09/24/2016 32.8  30.0 - 36.0 g/dL Final  . RDW 28/16/6418 12.8  11.5 - 15.5 % Final  . Platelets 09/24/2016 219  150 - 400 K/uL Final  Hospital Outpatient Visit on 09/16/2016  Component Date Value Ref Range Status  . aPTT 09/16/2016 36  24 - 36 seconds Final  . WBC 09/16/2016 5.6  4.0 - 10.5 K/uL Final  . RBC 09/16/2016 4.64  3.87 - 5.11 MIL/uL Final  . Hemoglobin 09/16/2016 13.6  12.0 - 15.0 g/dL Final  . HCT 30/43/0631 41.2  36.0 - 46.0 % Final  . MCV 09/16/2016 88.8  78.0 - 100.0 fL Final  . MCH 09/16/2016 29.3  26.0 - 34.0 pg Final  . MCHC 09/16/2016 33.0  30.0 - 36.0 g/dL Final  . RDW 79/21/3039 12.9  11.5 - 15.5 % Final  . Platelets 09/16/2016 313  150 - 400 K/uL Final  . Sodium 09/16/2016 139  135 - 145 mmol/L Final  . Potassium 09/16/2016 4.2  3.5 - 5.1 mmol/L Final  . Chloride 09/16/2016 106  101 - 111 mmol/L Final  . CO2 09/16/2016 27  22 - 32 mmol/L Final  . Glucose, Bld 09/16/2016 93  65 - 99 mg/dL Final  . BUN 74/91/5190 14  6 - 20 mg/dL Final  . Creatinine, Ser 09/16/2016 1.06* 0.44 - 1.00 mg/dL Final  . Calcium 46/79/3853 9.7  8.9 - 10.3 mg/dL Final  . Total Protein 09/16/2016 7.1  6.5 - 8.1 g/dL Final  . Albumin 70/83/3273 4.2  3.5 - 5.0 g/dL Final  . AST 80/94/7011 20  15 - 41 U/L Final  . ALT  09/16/2016 22  14 - 54 U/L Final  . Alkaline Phosphatase 09/16/2016 53  38 - 126 U/L Final  .  Total Bilirubin 09/16/2016 0.4  0.3 - 1.2 mg/dL Final  . GFR calc non Af Amer 09/16/2016 52* >60 mL/min Final  . GFR calc Af Amer 09/16/2016 >60  >60 mL/min Final   Comment: (NOTE) The eGFR has been calculated using the CKD EPI equation. This calculation has not been validated in all clinical situations. eGFR's persistently <60 mL/min signify possible Chronic Kidney Disease.   . Anion gap 09/16/2016 6  5 - 15 Final  . Prothrombin Time 09/16/2016 12.8  11.4 - 15.2 seconds Final  . INR 09/16/2016 0.96   Final  . ABO/RH(D) 09/16/2016 O POS   Final  . Antibody Screen 09/16/2016 NEG   Final  . Sample Expiration 09/16/2016 09/24/2016   Final  . Extend sample reason 09/16/2016 NO TRANSFUSIONS OR PREGNANCY IN THE PAST 3 MONTHS   Final  . MRSA, PCR 09/16/2016 NEGATIVE  NEGATIVE Final  . Staphylococcus aureus 09/16/2016 NEGATIVE  NEGATIVE Final   Comment:        The Xpert SA Assay (FDA approved for NASAL specimens in patients over 32 years of age), is one component of a comprehensive surveillance program.  Test performance has been validated by [facility] for patients greater than or equal to 23 year old. It is not intended to diagnose infection nor to guide or monitor treatment.   . ABO/RH(D) 09/16/2016 O POS   Final     X-Rays:No results found.  EKG: Orders placed or performed during the hospital encounter of 06/17/16  . EKG 12-Lead  . EKG 12-Lead  . EKG 12-Lead  . EKG 12-Lead  . EKG     Hospital Course: Anne Hogan is a 71 y.o. who was admitted to [facility]. They were brought to the operating room on 09/21/2016 and underwent Procedure(s): RIGHT TOTAL KNEE ARTHROPLASTY.  Patient tolerated the procedure well and was later transferred to the recovery room and then to the orthopaedic floor for postoperative care.  They were given PO and IV analgesics for pain control  following their surgery.  They were given 24 hours of postoperative antibiotics of  Anti-infectives    Start     Dose/Rate Route Frequency Ordered Stop   09/21/16 2330  vancomycin (VANCOCIN) IVPB 1000 mg/200 mL premix     1,000 mg 200 mL/hr over 60 Minutes Intravenous Every 12 hours 09/21/16 1512 09/22/16 0018   09/21/16 0841  ceFAZolin (ANCEF) IVPB 2g/100 mL premix     2 g 200 mL/hr over 30 Minutes Intravenous On call to O.R. 09/21/16 7209 09/21/16 1152     and started on DVT prophylaxis in the form of Xarelto.   PT and OT were ordered for total joint protocol.  Discharge planning consulted to help with postop disposition and equipment needs.  Patient had a decnt night on the evening of surgery.  They started to get up OOB with therapy on day one. Hemovac drain was pulled without difficulty.  Continued to work with therapy into day two.  Dressing was changed on day two and the incision was healing well.  By day three, the patient had progressed with therapy and meeting their goals.  Incision was healing well.  Patient was seen in rounds and was ready to go home.  Discharge home with home health Diet - Regular diet Follow up - in 2 weeks Activity - WBAT Disposition - Home Condition Upon Discharge - Good D/C Meds - See DC Summary DVT Prophylaxis - Xarelto   Discharge Instructions  Call MD / Call 911    Complete by:  As directed    If you experience chest pain or shortness of breath, CALL 911 and be transported to the hospital emergency room.  If you develope a fever above 101 F, pus (white drainage) or increased drainage or redness at the wound, or calf pain, call your surgeon's office.   Change dressing    Complete by:  As directed    Change dressing daily with sterile 4 x 4 inch gauze dressing and apply TED hose. Do not submerge the incision under water.   Constipation Prevention    Complete by:  As directed    Drink plenty of fluids.  Prune juice may be helpful.  You may use a  stool softener, such as Colace (over the counter) 100 mg twice a day.  Use MiraLax (over the counter) for constipation as needed.   Diet - low sodium heart healthy    Complete by:  As directed    Discharge instructions    Complete by:  As directed    Pick up stool softner and laxative for home use following surgery while on pain medications. Do not submerge incision under water. Please use good hand washing techniques while changing dressing each day. May shower starting three days after surgery. Please use a clean towel to pat the incision dry following showers. Continue to use ice for pain and swelling after surgery. Do not use any lotions or creams on the incision until instructed by your surgeon.  Wear both TED hose on both legs during the day every day for three weeks, but may have off at night at home.  Postoperative Constipation Protocol  Constipation - defined medically as fewer than three stools per week and severe constipation as less than one stool per week.  One of the most common issues patients have following surgery is constipation.  Even if you have a regular bowel pattern at home, your normal regimen is likely to be disrupted due to multiple reasons following surgery.  Combination of anesthesia, postoperative narcotics, change in appetite and fluid intake all can affect your bowels.  In order to avoid complications following surgery, here are some recommendations in order to help you during your recovery period.  Colace (docusate) - Pick up an over-the-counter form of Colace or another stool softener and take twice a day as long as you are requiring postoperative pain medications.  Take with a full glass of water daily.  If you experience loose stools or diarrhea, hold the colace until you stool forms back up.  If your symptoms do not get better within 1 week or if they get worse, check with your doctor.  Dulcolax (bisacodyl) - Pick up over-the-counter and take as directed by  the product packaging as needed to assist with the movement of your bowels.  Take with a full glass of water.  Use this product as needed if not relieved by Colace only.   MiraLax (polyethylene glycol) - Pick up over-the-counter to have on hand.  MiraLax is a solution that will increase the amount of water in your bowels to assist with bowel movements.  Take as directed and can mix with a glass of water, juice, soda, coffee, or tea.  Take if you go more than two days without a movement. Do not use MiraLax more than once per day. Call your doctor if you are still constipated or irregular after using this medication for 7 days in a row.  If you continue to have problems with postoperative constipation, please contact the office for further assistance and recommendations.  If you experience "the worst abdominal pain ever" or develop nausea or vomiting, please contact the office immediatly for further recommendations for treatment.   Take Xarelto for two and a half more weeks, then discontinue Xarelto. Once the patient has completed the blood thinner regimen, then take a Baby 81 mg Aspirin daily for three more weeks.   Do not put a pillow under the knee. Place it under the heel.    Complete by:  As directed    Do not sit on low chairs, stoools or toilet seats, as it may be difficult to get up from low surfaces    Complete by:  As directed    Driving restrictions    Complete by:  As directed    No driving until released by the physician.   Increase activity slowly as tolerated    Complete by:  As directed    Lifting restrictions    Complete by:  As directed    No lifting until released by the physician.   Patient may shower    Complete by:  As directed    You may shower without a dressing once there is no drainage.  Do not wash over the wound.  If drainage remains, do not shower until drainage stops.   TED hose    Complete by:  As directed    Use stockings (TED hose) for 3 weeks on both leg(s).   You may remove them at night for sleeping.   Weight bearing as tolerated    Complete by:  As directed    Laterality:  right   Extremity:  Lower     Allergies as of 09/24/2016      Reactions   Penicillins Rash   Has patient had a PCN reaction causing immediate rash, facial/tongue/throat swelling, SOB or lightheadedness with hypotension: unknown Has patient had a PCN reaction causing severe rash involving mucus membranes or skin necrosis: no Has patient had a PCN reaction that required hospitalization: unknown Has patient had a PCN reaction occurring within the last 10 years: no If all of the above answers are "NO", then may proceed with Cephalosporin use.   Codeine Other (See Comments)   Possible sensitivity, patient not sure   Milk-related Compounds Nausea And Vomiting, Other (See Comments)   Can have ice cream   Morphine And Related    "strange sensation across midsection and chest"      Medication List    STOP taking these medications   AIRBORNE GUMMIES Chew   CALTRATE 600+D PO   naproxen sodium 220 MG tablet Commonly known as:  ANAPROX   RA ONE DAILY GUMMY VITES PO   Vitamin D (Ergocalciferol) 50000 units Caps capsule Commonly known as:  DRISDOL     TAKE these medications   acetaminophen 500 MG tablet Commonly known as:  TYLENOL Take 500-1,000 mg by mouth every 6 (six) hours as needed for moderate pain (depends on pain if takes 1-2 tablets).   alum hydroxide-mag trisilicate 63-89 MG Chew chewable tablet Commonly known as:  GAVISCON Chew 2 tablets by mouth at bedtime as needed for indigestion or heartburn.   buPROPion 300 MG 24 hr tablet Commonly known as:  WELLBUTRIN XL Take 300 mg by mouth daily.   HYDROcodone-acetaminophen 7.5-325 MG tablet Commonly known as:  NORCO Take 1-2 tablets by mouth every 4 (four) hours as needed for moderate pain  or severe pain.   methocarbamol 500 MG tablet Commonly known as:  ROBAXIN Take 1 tablet (500 mg total) by mouth every  6 (six) hours as needed for muscle spasms.   polyethylene glycol packet Commonly known as:  MIRALAX / GLYCOLAX Take 8.5 g by mouth daily.   rivaroxaban 10 MG Tabs tablet Commonly known as:  XARELTO Take 1 tablet (10 mg total) by mouth daily with breakfast. Take Xarelto for two and a half more weeks following discharge from the hospital, then discontinue Xarelto. Once the patient has completed the blood thinner regimen, then take a Baby 81 mg Aspirin daily for three more weeks.   sodium chloride 0.65 % Soln nasal spray Commonly known as:  OCEAN Place 1 spray into both nostrils as needed for congestion.      Follow-up Information    [facility] Follow up.   Why:  Tharon Aquas has been requested for your physical therapist Contact information: [address] [place] [address] [phone]        Gearlean Alf, MD. Schedule an appointment as soon as possible for a visit on 10/06/2016.   Specialty:  Orthopedic Surgery Contact information: [address] [place] [address] [phone]           Signed: Arlee Muslim, PA-C Orthopaedic Surgery 09/24/2016, 7:49 AM

## 2016-09-22 NOTE — Progress Notes (Signed)
OT Cancellation Note  Patient Details Name: Anne Hogan MRN: [id number] DOB: 08-28-1945   Cancelled Treatment:    Reason Eval/Treat Not Completed: Medical issues which prohibited therapy, low BP. Will check back later or tomorrow.  [name] 09/22/2016, 12:02 PM  Lesle Chris, OTR/L [phone] 09/22/2016

## 2016-09-22 NOTE — Progress Notes (Signed)
   09/22/16 1700  PT Visit Information  Last PT Received On 09/22/16  Assistance Needed +2  History of Present Illness Pt is a 71 year old female s/p R TKA due to fracture of R tibial plateau in Nov 2017 and arthritis  Subjective Data  Subjective Pt very groggy this afternoon likely from meds.  Pt awake for transfer however quickly fell back asleep.  BP 108/53 mmHg supine; 116/64 mmHg sitting; 112/57 mmHg HR 57 upon sitting in recliner.  SpO2 dropped to 86-89% room air at times during mobility so reapplied 1L O2 [place] and RN notified.  Precautions  Precautions Fall;Knee  Required Braces or Orthoses Knee Immobilizer - Right  Restrictions  Other Position/Activity Restrictions WBAT  Pain Assessment  Pain Assessment Faces  Pain Score 0  Pain Intervention(s) Repositioned;Monitored during session  Cognition  Arousal/Alertness Lethargic;Suspect due to medications  Overall Cognitive Status Within Functional Limits for tasks assessed  Bed Mobility  Overal bed mobility Needs Assistance  Bed Mobility Supine to Sit  Supine to sit Min assist  General bed mobility comments verbal cues for technique, assist to scoot to EOB  Transfers  Overall transfer level Needs assistance  Equipment used Rolling walker (2 wheeled)  Transfers Sit to/from [employer] Transfers  Sit to Stand Min assist  Stand pivot transfers Min guard  General transfer comment verbal cues for UE and LE positioning, only pivoted to recliner due to grogginess  PT - End of Session  Equipment Utilized During Treatment Gait belt;Oxygen  Activity Tolerance Patient limited by lethargy;Patient limited by fatigue  Patient left in chair;with call bell/phone within reach;with chair alarm set  Nurse Communication Mobility status  PT - Assessment/Plan  PT Plan Current plan remains appropriate  PT Visit Diagnosis Other abnormalities of gait and mobility (R26.89)  PT Frequency (ACUTE ONLY) 7X/week  Follow Up Recommendations Home health  PT  PT equipment None recommended by PT  AM-PAC PT "6 Clicks" Daily Activity Outcome Measure  Difficulty turning over in bed (including adjusting bedclothes, sheets and blankets)? 3  Difficulty moving from lying on back to sitting on the side of the bed?  3  Difficulty sitting down on and standing up from a chair with arms (e.g., wheelchair, bedside commode, etc,.)? 3  Help needed moving to and from a bed to chair (including a wheelchair)? 3  Help needed walking in hospital room? 2  Help needed climbing 3-5 steps with a railing?  2  6 Click Score 16  Mobility G Code  CK  PT Goal Progression  Progress towards PT goals Progressing toward goals  PT Time Calculation  PT Start Time (ACUTE ONLY) 1509  PT Stop Time (ACUTE ONLY) 1520  PT Time Calculation (min) (ACUTE ONLY) 11 min  PT General Charges  $$ ACUTE PT VISIT 1 Procedure  PT Treatments  $Therapeutic Activity 8-22 mins   Carmelia Bake, PT, DPT 09/22/2016 Pager: [phone]

## 2016-09-22 NOTE — Discharge Instructions (Addendum)
° °Dr. Frank Aluisio °Total Joint Specialist °[facility] °[address] °[place], [place] [address] °[phone] ° °TOTAL KNEE REPLACEMENT POSTOPERATIVE DIRECTIONS ° °Knee Rehabilitation, Guidelines Following Surgery  °Results after knee surgery are often greatly improved when you follow the exercise, range of motion and muscle strengthening exercises prescribed by your doctor. Safety measures are also important to protect the knee from further injury. Any time any of these exercises cause you to have increased pain or swelling in your knee joint, decrease the amount until you are comfortable again and slowly increase them. If you have problems or questions, call your caregiver or physical therapist for advice.  ° °HOME CARE INSTRUCTIONS  °Remove items at home which could result in a fall. This includes throw rugs or furniture in walking pathways.  °· ICE to the affected knee every three hours for 30 minutes at a time and then as needed for pain and swelling.  Continue to use ice on the knee for pain and swelling from surgery. You may notice swelling that will progress down to the foot and ankle.  This is normal after surgery.  Elevate the leg when you are not up walking on it.   °· Continue to use the breathing machine which will help keep your temperature down.  It is common for your temperature to cycle up and down following surgery, especially at night when you are not up moving around and exerting yourself.  The breathing machine keeps your lungs expanded and your temperature down. °· Do not place pillow under knee, focus on keeping the knee straight while resting ° °DIET °You may resume your previous home diet once your are discharged from the hospital. ° °DRESSING / WOUND CARE / SHOWERING °You may shower 3 days after surgery, but keep the wounds dry during showering.  You may use an occlusive plastic wrap (Press'n Seal for example), NO SOAKING/SUBMERGING IN THE BATHTUB.  If the  bandage gets wet, change with a clean dry gauze.  If the incision gets wet, pat the wound dry with a clean towel. °You may start showering once you are discharged home but do not submerge the incision under water. Just pat the incision dry and apply a dry gauze dressing on daily. °Change the surgical dressing daily and reapply a dry dressing each time. ° °ACTIVITY °Walk with your walker as instructed. °Use walker as long as suggested by your caregivers. °Avoid periods of inactivity such as sitting longer than an hour when not asleep. This helps prevent blood clots.  °You may resume a sexual relationship in one month or when given the OK by your doctor.  °You may return to work once you are cleared by your doctor.  °Do not drive a car for 6 weeks or until released by you surgeon.  °Do not drive while taking narcotics. ° °WEIGHT BEARING °Weight bearing as tolerated with assist device (walker, cane, etc) as directed, use it as long as suggested by your surgeon or therapist, typically at least 4-6 weeks. ° °POSTOPERATIVE CONSTIPATION PROTOCOL °Constipation - defined medically as fewer than three stools per week and severe constipation as less than one stool per week. ° °One of the most common issues patients have following surgery is constipation.  Even if you have a regular bowel pattern at home, your normal regimen is likely to be disrupted due to multiple reasons following surgery.  Combination of anesthesia, postoperative narcotics, change in appetite and fluid intake all can affect your bowels.    In order to avoid complications following surgery, here are some recommendations in order to help you during your recovery period. ° °Colace (docusate) - Pick up an over-the-counter form of Colace or another stool softener and take twice a day as long as you are requiring postoperative pain medications.  Take with a full glass of water daily.  If you experience loose stools or diarrhea, hold the colace until you stool forms  back up.  If your symptoms do not get better within 1 week or if they get worse, check with your doctor. ° °Dulcolax (bisacodyl) - Pick up over-the-counter and take as directed by the product packaging as needed to assist with the movement of your bowels.  Take with a full glass of water.  Use this product as needed if not relieved by Colace only.  ° °MiraLax (polyethylene glycol) - Pick up over-the-counter to have on hand.  MiraLax is a solution that will increase the amount of water in your bowels to assist with bowel movements.  Take as directed and can mix with a glass of water, juice, soda, coffee, or tea.  Take if you go more than two days without a movement. °Do not use MiraLax more than once per day. Call your doctor if you are still constipated or irregular after using this medication for 7 days in a row. ° °If you continue to have problems with postoperative constipation, please contact the office for further assistance and recommendations.  If you experience "the worst abdominal pain ever" or develop nausea or vomiting, please contact the office immediatly for further recommendations for treatment. ° °ITCHING ° If you experience itching with your medications, try taking only a single pain pill, or even half a pain pill at a time.  You can also use Benadryl over the counter for itching or also to help with sleep.  ° °TED HOSE STOCKINGS °Wear the elastic stockings on both legs for three weeks following surgery during the day but you may remove then at night for sleeping. ° °MEDICATIONS °See your medication summary on the “After Visit Summary” that the nursing staff will review with you prior to discharge.  You may have some home medications which will be placed on hold until you complete the course of blood thinner medication.  It is important for you to complete the blood thinner medication as prescribed by your surgeon.  Continue your approved medications as instructed at time of  discharge. ° °PRECAUTIONS °If you experience chest pain or shortness of breath - call 911 immediately for transfer to the hospital emergency department.  °If you develop a fever greater that 101 F, purulent drainage from wound, increased redness or drainage from wound, foul odor from the wound/dressing, or calf pain - CONTACT YOUR SURGEON.   °                                                °FOLLOW-UP APPOINTMENTS °Make sure you keep all of your appointments after your operation with your surgeon and caregivers. You should call the office at the above phone number and make an appointment for approximately two weeks after the date of your surgery or on the date instructed by your surgeon outlined in the "After Visit Summary". ° ° °RANGE OF MOTION AND STRENGTHENING EXERCISES  °Rehabilitation of the knee is important following a knee injury or   an operation. After just a few days of immobilization, the muscles of the thigh which control the knee become weakened and shrink (atrophy). Knee exercises are designed to build up the tone and strength of the thigh muscles and to improve knee motion. Often times heat used for twenty to thirty minutes before working out will loosen up your tissues and help with improving the range of motion but do not use heat for the first two weeks following surgery. These exercises can be done on a training (exercise) mat, on the floor, on a table or on a bed. Use what ever works the best and is most comfortable for you Knee exercises include:  Leg Lifts - While your knee is still immobilized in a splint or cast, you can do straight leg raises. Lift the leg to 60 degrees, hold for 3 sec, and slowly lower the leg. Repeat 10-20 times 2-3 times daily. Perform this exercise against resistance later as your knee gets better.  Quad and Hamstring Sets - Tighten up the muscle on the front of the thigh (Quad) and hold for 5-10 sec. Repeat this 10-20 times hourly. Hamstring sets are done by pushing the  foot backward against an object and holding for 5-10 sec. Repeat as with quad sets.   Leg Slides: Lying on your back, slowly slide your foot toward your buttocks, bending your knee up off the floor (only go as far as is comfortable). Then slowly slide your foot back down until your leg is flat on the floor again.  Angel Wings: Lying on your back spread your legs to the side as far apart as you can without causing discomfort.  A rehabilitation program following serious knee injuries can speed recovery and prevent re-injury in the future due to weakened muscles. Contact your doctor or a physical therapist for more information on knee rehabilitation.   IF YOU ARE TRANSFERRED TO A SKILLED REHAB FACILITY If the patient is transferred to a skilled rehab facility following release from the hospital, a list of the current medications will be sent to the facility for the patient to continue.  When discharged from the skilled rehab facility, please have the facility set up the patient's [place] prior to being released. Also, the skilled facility will be responsible for providing the patient with their medications at time of release from the facility to include their pain medication, the muscle relaxants, and their blood thinner medication. If the patient is still at the rehab facility at time of the two week follow up appointment, the skilled rehab facility will also need to assist the patient in arranging follow up appointment in our office and any transportation needs.  MAKE SURE YOU:  Understand these instructions.  Get help right away if you are not doing well or get worse.    Pick up stool softner and laxative for home use following surgery while on pain medications. Do not submerge incision under water. Please use good hand washing techniques while changing dressing each day. May shower starting three days after surgery. Please use a clean towel to pat the incision dry following  showers. Continue to use ice for pain and swelling after surgery. Do not use any lotions or creams on the incision until instructed by your surgeon.  Take Xarelto for two and a half more weeks following discharge from the hospital, then discontinue Xarelto. Once the patient has completed the blood thinner regimen, then take a Baby 81 mg Aspirin daily for three  more weeks.    Information on my medicine - XARELTO (Rivaroxaban)  This medication education was reviewed with me or my healthcare representative as part of my discharge preparation.    Why was Xarelto prescribed for you? Xarelto was prescribed for you to reduce the risk of blood clots forming after orthopedic surgery. The medical term for these abnormal blood clots is venous thromboembolism (VTE).  What do you need to know about xarelto ? Take your Xarelto ONCE DAILY at the same time every day. You may take it either with or without food.  If you have difficulty swallowing the tablet whole, you may crush it and mix in applesauce just prior to taking your dose.  Take Xarelto exactly as prescribed by your doctor and DO NOT stop taking Xarelto without talking to the doctor who prescribed the medication.  Stopping without other VTE prevention medication to take the place of Xarelto may increase your risk of developing a clot.  After discharge, you should have regular check-up appointments with your healthcare provider that is prescribing your Xarelto.    What do you do if you miss a dose? If you miss a dose, take it as soon as you remember on the same day then continue your regularly scheduled once daily regimen the next day. Do not take two doses of Xarelto on the same day.   Important Safety Information A possible side effect of Xarelto is bleeding. You should call your healthcare provider right away if you experience any of the following: ? Bleeding from an injury or your nose that does not stop. ? Unusual colored  urine (red or dark brown) or unusual colored stools (red or black). ? Unusual bruising for unknown reasons. ? A serious fall or if you hit your head (even if there is no bleeding).  Some medicines may interact with Xarelto and might increase your risk of bleeding while on Xarelto. To help avoid this, consult your healthcare provider or pharmacist prior to using any new prescription or non-prescription medications, including herbals, vitamins, non-steroidal anti-inflammatory drugs (NSAIDs) and supplements.  This website has more information on Xarelto: [email or web address]

## 2016-09-23 LAB — CBC
HCT: 29.9 % — ABNORMAL LOW (ref 36.0–46.0)
Hemoglobin: 9.7 g/dL — ABNORMAL LOW (ref 12.0–15.0)
MCH: 28.7 pg (ref 26.0–34.0)
MCHC: 32.4 g/dL (ref 30.0–36.0)
MCV: 88.5 fL (ref 78.0–100.0)
PLATELETS: 230 10*3/uL (ref 150–400)
RBC: 3.38 MIL/uL — AB (ref 3.87–5.11)
RDW: 12.9 % (ref 11.5–15.5)
WBC: 7 10*3/uL (ref 4.0–10.5)

## 2016-09-23 LAB — BASIC METABOLIC PANEL
Anion gap: 4 — ABNORMAL LOW (ref 5–15)
BUN: 13 mg/dL (ref 6–20)
CO2: 26 mmol/L (ref 22–32)
CREATININE: 0.93 mg/dL (ref 0.44–1.00)
Calcium: 8.5 mg/dL — ABNORMAL LOW (ref 8.9–10.3)
Chloride: 111 mmol/L (ref 101–111)
Glucose, Bld: 97 mg/dL (ref 65–99)
POTASSIUM: 3.7 mmol/L (ref 3.5–5.1)
SODIUM: 141 mmol/L (ref 135–145)

## 2016-09-23 MED ORDER — SODIUM CHLORIDE 0.9 % IV BOLUS (SEPSIS)
500.0000 mL | Freq: Once | INTRAVENOUS | Status: AC
  Administered 2016-09-23: 08:00:00 500 mL via INTRAVENOUS

## 2016-09-23 MED ORDER — HYDROCODONE-ACETAMINOPHEN 7.5-325 MG PO TABS
1.0000 | ORAL_TABLET | ORAL | Status: DC | PRN
  Administered 2016-09-23 – 2016-09-24 (×4): 2 via ORAL
  Filled 2016-09-23 (×4): qty 2

## 2016-09-23 NOTE — Progress Notes (Signed)
Physical Therapy Treatment Patient Details Name: Anne Hogan MRN: [id number] DOB: August 22, 1945 Today's Date: 09/23/2016    History of Present Illness Pt is a 71 year old female s/p R TKA due to fracture of R tibial plateau in Nov 2017 and arthritis    PT Comments    Good progress with mobility today, pt ambulated 76' with RW and performed TKA exercises. She is able to do SLR independently.   Follow Up Recommendations  Home health PT     Equipment Recommendations  None recommended by PT    Recommendations for Other Services       Precautions / Restrictions Precautions Precautions: Fall;Knee Required Braces or Orthoses:  Restrictions Weight Bearing Restrictions: No Other Position/Activity Restrictions: WBAT    Mobility  Bed Mobility Overal bed mobility: Needs Assistance Bed Mobility: Supine to Sit     Supine to sit: Supervision     General bed mobility comments: instructed pt to self assist RLE with LLE  Transfers Overall transfer level: Needs assistance Equipment used: Rolling walker (2 wheeled) Transfers: Sit to/from [facility] Sit to Stand: Min assist         General transfer comment: verbal cues for UE and LE positioning  Ambulation/Gait Ambulation/Gait assistance: Min guard Ambulation Distance (Feet): 80 Feet Assistive device: Rolling walker (2 wheeled) Gait Pattern/deviations: Step-to pattern;Antalgic;Decreased weight shift to right   Gait velocity interpretation: Below normal speed for age/gender General Gait Details: steady with RW, no LOB   Stairs            Wheelchair Mobility    Modified Rankin (Stroke Patients Only)       Balance Overall balance assessment: Modified Independent                                  Cognition Arousal/Alertness: Awake/alert Behavior During Therapy: WFL for tasks assessed/performed Overall Cognitive Status: Within Functional Limits for tasks assessed                       Exercises Total Joint Exercises Ankle Circles/Pumps: AROM;10 reps;Both Quad Sets: AROM;Right;10 reps Short Arc Quad: AROM;Right;10 reps;Supine Heel Slides: AAROM;Right;10 reps;Supine Straight Leg Raises: AROM;Right;10 reps;Supine Knee Flexion: AAROM;AROM;Right;5 reps;Seated Goniometric ROM: 0-40* AAROM R knee    General Comments        Pertinent Vitals/Pain Pain Score: 3  Pain Location: R knee Pain Descriptors / Indicators: Aching;Sore Pain Intervention(s): Limited activity within patient's tolerance;Monitored during session;Premedicated before session;Ice applied    Home Living                      Prior Function            PT Goals (current goals can now be found in the care plan section) Acute Rehab PT Goals PT Goal Formulation: With patient Time For Goal Achievement: 09/26/16 Potential to Achieve Goals: Good Progress towards PT goals: Progressing toward goals    Frequency    7X/week      PT Plan Current plan remains appropriate    Co-evaluation             End of Session Equipment Utilized During Treatment: Gait belt Activity Tolerance: Patient tolerated treatment well Patient left: in chair;with call bell/phone within reach;with chair alarm set Nurse Communication: Mobility status PT Visit Diagnosis: Other abnormalities of gait and mobility (R26.89);Pain Pain - Right/Left: Right Pain - part of body:  Knee     Time: 5300-5110 PT Time Calculation (min) (ACUTE ONLY): 23 min  Charges:  $Gait Training: 8-22 mins $Therapeutic Exercise: 8-22 mins                    G Codes:       Philomena Doheny 09/23/2016, 12:23 PM [phone]

## 2016-09-23 NOTE — Progress Notes (Signed)
OT Cancellation Note  Patient Details Name: Anne Hogan MRN: [id number] DOB: 14-Mar-1946   Cancelled Treatment:    Reason Eval/Treat Not Completed: Fatigue/lethargy limiting ability to participate  Brandelyn Henne 09/23/2016, 9:56 AM  Lesle Chris, OTR/L [phone] 09/23/2016

## 2016-09-23 NOTE — Progress Notes (Signed)
Physical Therapy Treatment Patient Details Name: Anne Hogan MRN: [id number] DOB: July 17, 1946 Today's Date: 09/23/2016    History of Present Illness Pt is a 71 year old female s/p R TKA due to fracture of R tibial plateau in Nov 2017 and arthritis    PT Comments    Pt ambulated 40' with RW, distance limited by 9/10 R knee pain. Performed stair training and limited exercises due to pain. Pt stated she does not yet feel ready to DC home because her husband is a patient in the hospital at present and she hasn't yet fully arranged caregivers for herself. Noted pt was nauseous yesterday and was very limited with participation in PT.    Follow Up Recommendations  Home health PT     Equipment Recommendations  None recommended by PT    Recommendations for Other Services       Precautions / Restrictions Precautions Precautions: Fall;Knee Required Braces or Orthoses: Knee Immobilizer - Right Restrictions Other Position/Activity Restrictions: WBAT    Mobility  Bed Mobility Overal bed mobility: Needs Assistance Bed Mobility: Supine to Sit     Supine to sit: Supervision     General bed mobility comments: pt up in chair  Transfers Overall transfer level: Needs assistance Equipment used: Rolling walker (2 wheeled) Transfers: Sit to/from Stand Sit to Stand: Min guard         General transfer comment: verbal cues for UE and LE positioning  Ambulation/Gait Ambulation/Gait assistance: Min guard Ambulation Distance (Feet): 45 Feet Assistive device: Rolling walker (2 wheeled) Gait Pattern/deviations: Step-to pattern;Antalgic;Decreased weight shift to right   Gait velocity interpretation: Below normal speed for age/gender General Gait Details: steady with RW, no LOB, distance limited by 9/10 R knee pain   Stairs Stairs: Yes   Stair Management: No rails;Backwards;Step to pattern;With walker Number of Stairs: 1 General stair comments: VCs sequencing, min A to manage  RW  Wheelchair Mobility    Modified Rankin (Stroke Patients Only)       Balance Overall balance assessment: Modified Independent                                  Cognition Arousal/Alertness: Awake/alert Behavior During Therapy: WFL for tasks assessed/performed Overall Cognitive Status: Within Functional Limits for tasks assessed                      Exercises Total Joint Exercises Ankle Circles/Pumps: AROM;10 reps;Both Quad Sets: AROM;Right;10 reps Short Arc Quad: AROM;Right;10 reps;Supine Heel Slides: AAROM;Right;10 reps;Supine Hip ABduction/ADduction: AROM;Right;10 reps Straight Leg Raises: AROM;Right;10 reps;Supine Knee Flexion: AAROM;AROM;Right;5 reps;Seated Goniometric ROM: 0-40* AAROM R knee    General Comments        Pertinent Vitals/Pain Pain Score: 9  Pain Location: R knee Pain Descriptors / Indicators: Aching;Sore Pain Intervention(s): Limited activity within patient's tolerance;Monitored during session;Premedicated before session (pt declined ice)    Home Living                      Prior Function            PT Goals (current goals can now be found in the care plan section) Acute Rehab PT Goals PT Goal Formulation: With patient Time For Goal Achievement: 09/26/16 Potential to Achieve Goals: Good Progress towards PT goals: Progressing toward goals    Frequency    7X/week      PT Plan Current plan remains  appropriate    Co-evaluation             End of Session Equipment Utilized During Treatment: Gait belt Activity Tolerance: Patient tolerated treatment well Patient left: Other (comment) (in bathroom with OT) Nurse Communication: Mobility status PT Visit Diagnosis: Other abnormalities of gait and mobility (R26.89);Pain Pain - Right/Left: Right Pain - part of body: Knee     Time: 1321-1340 PT Time Calculation (min) (ACUTE ONLY): 19 min  Charges:  $Gait Training: 8-22 mins $Therapeutic Exercise:  8-22 mins                    G Codes:       Philomena Doheny 09/23/2016, 1:49 PM [phone]

## 2016-09-23 NOTE — Progress Notes (Signed)
   Subjective: 2 Days Post-Op Procedure(s) (LRB): RIGHT TOTAL KNEE ARTHROPLASTY (Right) Patient reports pain as mild and moderate.   Patient seen in rounds with Dr. Wynelle Link. Patient is well, but has had some minor complaints of pain in the knee, requiring pain medications Patient is ready to go home if meets goals  Objective: Vital signs in last 24 hours: Temp:  [97.4 F (36.3 C)-99.2 F (37.3 C)] 98.6 F (37 C) (03/07 0626) Pulse Rate:  [58-68] 68 (03/07 0626) Resp:  [8-17] 10 (03/07 0626) BP: (92-110)/(47-63) 92/47 (03/07 0626) SpO2:  [90 %-98 %] 95 % (03/07 0626)  Intake/Output from previous day:  Intake/Output Summary (Last 24 hours) at 09/23/16 0737 Last data filed at 09/23/16 0600  Gross per 24 hour  Intake              420 ml  Output             1225 ml  Net             -805 ml    Intake/Output this shift: No intake/output data recorded.  Labs:  Recent Labs  09/22/16 0508 09/23/16 0515  HGB 10.6* 9.7*    Recent Labs  09/22/16 0508 09/23/16 0515  WBC 8.7 7.0  RBC 3.72* 3.38*  HCT 32.6* 29.9*  PLT 280 230    Recent Labs  09/22/16 0508 09/23/16 0515  NA 139 141  K 4.0 3.7  CL 107 111  CO2 26 26  BUN 11 13  CREATININE 0.75 0.93  GLUCOSE 132* 97  CALCIUM 8.6* 8.5*   No results for input(s): LABPT, INR in the last 72 hours.  EXAM: General - Patient is Alert, Appropriate and Oriented Extremity - Neurovascular intact Sensation intact distally Dorsiflexion/Plantar flexion intact Incision - clean, dry Motor Function - intact, moving foot and toes well on exam.   Assessment/Plan: 2 Days Post-Op Procedure(s) (LRB): RIGHT TOTAL KNEE ARTHROPLASTY (Right) Procedure(s) (LRB): RIGHT TOTAL KNEE ARTHROPLASTY (Right) Past Medical History:  Diagnosis Date  . ADD (attention deficit disorder)   . Arthritis    "right knee" (04/19/2013)  . Claustrophobia   . Depression   . Fracture of right tibial plateau 05/2016  . GERD (gastroesophageal reflux  disease)   . Heart murmur   . Migraine    "bad when I was in 5th or 6th grade; I can usually ward them off by resting my head now" (04/19/2013)  . MVP (mitral valve prolapse)   . Sleep apnea    has not used mouth guard in 5 years no cpap used   . Squamous carcinoma ?1999   "upper left groin" (04/19/2013)   Principal Problem:   OA (osteoarthritis) of knee  Estimated body mass index is 29.17 kg/m as calculated from the following:   Height as of this encounter: 5' 2.75" (1.594 m).   Weight as of this encounter: 74.1 kg (163 lb 6 oz). Discharge home with home health Diet - Cardiac diet Follow up - in 2 weeks Activity - WBAT Disposition - Home Condition Upon Discharge - pending therapy D/C Meds - See DC Summary DVT Prophylaxis - [place], PA-C Orthopaedic Surgery 09/23/2016, 7:37 AM

## 2016-09-23 NOTE — Evaluation (Signed)
Occupational Therapy Evaluation Patient Details Name: Anne Hogan MRN: [id number] DOB: 07-14-1946 Today's Date: 09/23/2016    History of Present Illness Pt is a 71 year old female s/p R TKA due to fracture of R tibial plateau in Nov 2017 and arthritis   Clinical Impression   Pt was admitted for the above. She was limited by pain during the OT evaluation. She would benefit from one more session of OT to address toileting, shower transfer and bed mobility with leg lifter. Goals are for supervision to min guard level    Follow Up Recommendations  Supervision/Assistance - 24 hour    Equipment Recommendations  None recommended by OT    Recommendations for Other Services       Precautions / Restrictions Precautions Precautions: Fall;Knee Precaution Comments: did not use KI; pt able to perform SLR Required Braces or Orthoses: Knee Immobilizer - Right Restrictions Other Position/Activity Restrictions: WBAT      Mobility Bed Mobility Overal bed mobility: Needs Assistance Bed Mobility: Sit to Supine     Supine to sit: Supervision Sit to supine: Min assist   General bed mobility comments: assist for RLE  Transfers Overall transfer level: Needs assistance Equipment used: Rolling walker (2 wheeled) Transfers: Sit to/from Stand Sit to Stand: Min guard         General transfer comment: verbal cues for UE and LE positioning    Balance Overall balance assessment: Modified Independent                                          ADL Overall ADL's : Needs assistance/impaired                         Toilet Transfer: Min guard;BSC;RW;Comfort height toilet   Toileting- Clothing Manipulation and Hygiene: Min guard;Sit to/from stand         General ADL Comments: pt currently needs set up for UB adls and min guard for LB bathing/ min A for LB dressing. She was limited with activities this pm due to pain (and it was not time for pain medication yet).   Reviewed shower sequence, but did not practice due to pain. Educated on leg lifter for OOB, but did not practice with bed mobility     Vision         Perception     Praxis      Pertinent Vitals/Pain Pain Assessment: 0-10 Pain Score: 8  Pain Location: R knee Pain Descriptors / Indicators: Aching;Sore Pain Intervention(s): Limited activity within patient's tolerance;Monitored during session;Premedicated before session;Repositioned;Ice applied     Hand Dominance     Extremity/Trunk Assessment Upper Extremity Assessment Upper Extremity Assessment: Overall WFL for tasks assessed           Communication Communication Communication: No difficulties   Cognition Arousal/Alertness: Awake/alert Behavior During Therapy: WFL for tasks assessed/performed Overall Cognitive Status: Within Functional Limits for tasks assessed                     General Comments       Exercises       Shoulder Instructions      Home Living Family/patient expects to be discharged to:: Private residence Living Arrangements: Spouse/significant other Available Help at Discharge: Personal care attendant Type of Home: House  Bathroom Shower/Tub: Occupational psychologist: Handicapped height     Home Equipment: Environmental consultant - 2 wheels;Wheelchair - manual   Additional Comments: spouse is currently in hospital. She has caregivers who assist him and who can help her (but not currently at night when he is hospitalized)      Prior Functioning/Environment Level of Independence: Independent with assistive device(s)                 OT Problem List:        OT Treatment/Interventions: Self-care/ADL training;DME and/or AE instruction;Patient/family education    OT Goals(Current goals can be found in the care plan section) Acute Rehab OT Goals Patient Stated Goal: decreased pain OT Goal Formulation: With patient Time For Goal Achievement: 09/30/16 Potential to  Achieve Goals: Good ADL Goals Pt Will Transfer to Toilet: with supervision;ambulating;bedside commode Pt Will Perform Tub/Shower Transfer: with supervision;Shower transfer;with min guard assist;ambulating;shower seat Additional ADL Goal #1: pt will be mod I with bed mobility using leg lifter  OT Frequency: Min 2X/week   Barriers to D/C:            Co-evaluation              End of Session    Activity Tolerance: Patient limited by pain Patient left: in bed;with call bell/phone within reach  OT Visit Diagnosis: Pain Pain - Right/Left: Right Pain - part of body: Knee                ADL either performed or assessed with clinical judgement  Time: 7616-0737 OT Time Calculation (min): 16 min Charges:  OT General Charges $OT Visit: 1 Procedure OT Evaluation $OT Eval Low Complexity: 1 Procedure G-Codes:     [place]bon, OTR/L [phone] 09/23/2016  Kassi Esteve 09/23/2016, 2:56 PM

## 2016-09-24 LAB — CBC
HCT: 28.7 % — ABNORMAL LOW (ref 36.0–46.0)
Hemoglobin: 9.4 g/dL — ABNORMAL LOW (ref 12.0–15.0)
MCH: 28.8 pg (ref 26.0–34.0)
MCHC: 32.8 g/dL (ref 30.0–36.0)
MCV: 88 fL (ref 78.0–100.0)
PLATELETS: 219 10*3/uL (ref 150–400)
RBC: 3.26 MIL/uL — AB (ref 3.87–5.11)
RDW: 12.8 % (ref 11.5–15.5)
WBC: 5.3 10*3/uL (ref 4.0–10.5)

## 2016-09-24 MED ORDER — HYDROCODONE-ACETAMINOPHEN 7.5-325 MG PO TABS: 1.0000 | ORAL_TABLET | ORAL | 0 refills | Status: DC | PRN

## 2016-09-24 MED ORDER — METHOCARBAMOL 500 MG PO TABS: 500.0000 mg | ORAL_TABLET | Freq: Four times a day (QID) | ORAL | 0 refills | Status: DC | PRN

## 2016-09-24 MED ORDER — RIVAROXABAN 10 MG PO TABS: 10.0000 mg | ORAL_TABLET | Freq: Every day | ORAL | 0 refills | Status: DC

## 2016-09-24 NOTE — Progress Notes (Signed)
Occupational Therapy Treatment Patient Details Name: Anne Hogan MRN: [id number] DOB: 03/21/1946 Today's Date: 09/24/2016    History of present illness Pt is a 71 year old female s/p R TKA due to fracture of R tibial plateau in Nov 2017 and arthritis   OT comments  All education completed. Pain much better controlled today  Follow Up Recommendations  Supervision/Assistance - 24 hour    Equipment Recommendations  None recommended by OT    Recommendations for Other Services      Precautions / Restrictions Precautions Precautions: Fall;Knee Required Braces or Orthoses: Knee Immobilizer - Right       Mobility Bed Mobility    supervision               Transfers       Sit to Stand: Supervision         General transfer comment: cues for UE placement    Balance                                   ADL                           Toilet Transfer: Min guard;BSC;RW;Comfort height toilet   Toileting- Clothing Manipulation and Hygiene: Supervision/safety;Sit to/from stand   Tub/ Shower Transfer: Walk-in shower;Min guard;Ambulation;Rolling walker     General ADL Comments: practiced bathroom transfers as well as bed mobility with leg lifter and a belt.  Used KI as pt could not perform SLR this am      Vision                     Perception     Praxis      Cognition   Behavior During Therapy: WFL for tasks assessed/performed Overall Cognitive Status: Within Functional Limits for tasks assessed                         Exercises     Shoulder Instructions       General Comments      Pertinent Vitals/ Pain       Pain Score: 4  Pain Location: R knee Pain Descriptors / Indicators: Sore Pain Intervention(s): Limited activity within patient's tolerance;Monitored during session;Premedicated before session;Repositioned;Ice applied  Home Living                                          Prior  Functioning/Environment              Frequency  Min 2X/week        Progress Toward Goals  OT Goals(current goals can now be found in the care plan section)  Progress towards OT goals: Progressing toward goals (no further OT needs)     Plan      Co-evaluation                 End of Session    Pain - Right/Left: Right Pain - part of body: Knee   Activity Tolerance Patient tolerated treatment well   Patient Left in bed;with call bell/phone within reach   Nurse Communication          Time: 0630-1601 OT Time Calculation (min): 18 min  Charges: OT General Charges $OT Visit:  1 Procedure OT Treatments $Self Care/Home Management : 8-22 mins  Anne Hogan, OTR/L [phone] 09/24/2016   Anne Hogan 09/24/2016, 10:12 AM

## 2016-09-24 NOTE — Progress Notes (Signed)
Physical Therapy Treatment Patient Details Name: Anne Hogan MRN: [id number] DOB: 1946/04/05 Today's Date: 09/24/2016    History of Present Illness Pt is a 71 year old female s/p R TKA due to fracture of R tibial plateau in Nov 2017 and arthritis    PT Comments    POD #3 pt feeling much better dressed and eager to D/C Assisted with amb a greater distance in hallway, practiced stairs and performed TKR TE's following HEP handout.    Follow Up Recommendations  Home health PT     Equipment Recommendations  None recommended by PT    Recommendations for Other Services       Precautions / Restrictions Precautions Precautions: Fall;Knee Precaution Comments: did not use KI; pt able to perform SLR Restrictions Weight Bearing Restrictions: No    Mobility  Bed Mobility               General bed mobility comments: OOB in recliner  Transfers Overall transfer level: Needs assistance Equipment used: Rolling walker (2 wheeled) Transfers: Sit to/from Stand Sit to Stand: Supervision         General transfer comment: cues for UE placement  Ambulation/Gait Ambulation/Gait assistance: Min guard;Supervision Ambulation Distance (Feet): 85 Feet Assistive device: Rolling walker (2 wheeled) Gait Pattern/deviations: Step-to pattern;Antalgic;Decreased weight shift to right Gait velocity: decreased   General Gait Details: 25% VC's on safety with turns   Stairs Stairs: Yes   Stair Management: No rails;Step to pattern Number of Stairs: 2 General stair comments: VCs sequencing, min A to manage RW  Wheelchair Mobility    Modified Rankin (Stroke Patients Only)       Balance                                    Cognition Arousal/Alertness: Awake/alert Behavior During Therapy: WFL for tasks assessed/performed Overall Cognitive Status: Within Functional Limits for tasks assessed                      Exercises   Total Knee Replacement TE's 10  reps B LE ankle pumps 10 reps towel squeezes 10 reps knee presses 10 reps heel slides  10 reps SAQ's 10 reps SLR's 10 reps ABD Followed by ICE     General Comments        Pertinent Vitals/Pain Pain Assessment: 0-10 Pain Score: 3  Pain Location: R knee Pain Descriptors / Indicators: Sore;Operative site guarding Pain Intervention(s): Monitored during session;Repositioned;Ice applied    Home Living                      Prior Function            PT Goals (current goals can now be found in the care plan section) Progress towards PT goals: Progressing toward goals    Frequency           PT Plan Current plan remains appropriate    Co-evaluation             End of Session Equipment Utilized During Treatment: Gait belt Activity Tolerance: Patient tolerated treatment well Patient left: in chair Nurse Communication:  (Pt ready for D/C to home) PT Visit Diagnosis: Other abnormalities of gait and mobility (R26.89);Pain     Time: 0945-1010 PT Time Calculation (min) (ACUTE ONLY): 25 min  Charges:  $Gait Training: 8-22 mins $Therapeutic Activity: 8-22 mins  G Codes:       Rica Koyanagi  PTA [facility]  Acute  Rehab Pager      [phone]

## 2016-09-24 NOTE — Progress Notes (Signed)
   Subjective: 3 Days Post-Op Procedure(s) (LRB): RIGHT TOTAL KNEE ARTHROPLASTY (Right) Patient reports pain as mild and moderate.   Patient seen in rounds by Dr. Wynelle Link. Did better with the change in pain medication Patient is well, but has had some minor complaints of pain in the knee, requiring pain medications Patient is ready to go home later today following therapy goals  Objective: Vital signs in last 24 hours: Temp:  [98 F (36.7 C)-98.2 F (36.8 C)] 98 F (36.7 C) (03/08 0641) Pulse Rate:  [82-87] 87 (03/08 0641) Resp:  [12-14] 14 (03/08 0641) BP: (104-114)/(50-66) 114/56 (03/08 0641) SpO2:  [96 %-99 %] 96 % (03/08 0641)  Intake/Output from previous day:  Intake/Output Summary (Last 24 hours) at 09/24/16 0745 Last data filed at 09/24/16 1478  Gross per 24 hour  Intake              480 ml  Output              500 ml  Net              -20 ml    Intake/Output this shift: No intake/output data recorded.  Labs:  Recent Labs  09/22/16 0508 09/23/16 0515 09/24/16 0448  HGB 10.6* 9.7* 9.4*    Recent Labs  09/23/16 0515 09/24/16 0448  WBC 7.0 5.3  RBC 3.38* 3.26*  HCT 29.9* 28.7*  PLT 230 219    Recent Labs  09/22/16 0508 09/23/16 0515  NA 139 141  K 4.0 3.7  CL 107 111  CO2 26 26  BUN 11 13  CREATININE 0.75 0.93  GLUCOSE 132* 97  CALCIUM 8.6* 8.5*   No results for input(s): LABPT, INR in the last 72 hours.  EXAM: General - Patient is Alert, Appropriate and Oriented Extremity - Neurovascular intact Sensation intact distally Intact pulses distally Dorsiflexion/Plantar flexion intact Incision - clean, dry Motor Function - intact, moving foot and toes well on exam.   Assessment/Plan: 3 Days Post-Op Procedure(s) (LRB): RIGHT TOTAL KNEE ARTHROPLASTY (Right) Procedure(s) (LRB): RIGHT TOTAL KNEE ARTHROPLASTY (Right) Past Medical History:  Diagnosis Date  . ADD (attention deficit disorder)   . Arthritis    "right knee" (04/19/2013)  .  Claustrophobia   . Depression   . Fracture of right tibial plateau 05/2016  . GERD (gastroesophageal reflux disease)   . Heart murmur   . Migraine    "bad when I was in 5th or 6th grade; I can usually ward them off by resting my head now" (04/19/2013)  . MVP (mitral valve prolapse)   . Sleep apnea    has not used mouth guard in 5 years no cpap used   . Squamous carcinoma ?1999   "upper left groin" (04/19/2013)   Principal Problem:   OA (osteoarthritis) of knee  Estimated body mass index is 29.17 kg/m as calculated from the following:   Height as of this encounter: 5' 2.75" (1.594 m).   Weight as of this encounter: 74.1 kg (163 lb 6 oz). Up with therapy Discharge home with home health Diet - Regular diet Follow up - in 2 weeks Activity - WBAT Disposition - Home Condition Upon Discharge - Good D/C Meds - See DC Summary DVT Prophylaxis - Xarelto  Arlee Muslim, PA-C Orthopaedic Surgery 09/24/2016, 7:45 AM

## 2016-09-25 DIAGNOSIS — Z9181 History of falling: Secondary | ICD-10-CM | POA: Diagnosis not present

## 2016-09-25 DIAGNOSIS — F329 Major depressive disorder, single episode, unspecified: Secondary | ICD-10-CM | POA: Diagnosis not present

## 2016-09-25 DIAGNOSIS — G4730 Sleep apnea, unspecified: Secondary | ICD-10-CM | POA: Diagnosis not present

## 2016-09-25 DIAGNOSIS — G43909 Migraine, unspecified, not intractable, without status migrainosus: Secondary | ICD-10-CM | POA: Diagnosis not present

## 2016-09-25 DIAGNOSIS — Z96651 Presence of right artificial knee joint: Secondary | ICD-10-CM | POA: Diagnosis not present

## 2016-09-25 DIAGNOSIS — Z79891 Long term (current) use of opiate analgesic: Secondary | ICD-10-CM | POA: Diagnosis not present

## 2016-09-25 DIAGNOSIS — Z7982 Long term (current) use of aspirin: Secondary | ICD-10-CM | POA: Diagnosis not present

## 2016-09-25 DIAGNOSIS — K219 Gastro-esophageal reflux disease without esophagitis: Secondary | ICD-10-CM | POA: Diagnosis not present

## 2016-09-25 DIAGNOSIS — S82101K Unspecified fracture of upper end of right tibia, subsequent encounter for closed fracture with nonunion: Secondary | ICD-10-CM | POA: Diagnosis not present

## 2016-10-01 DIAGNOSIS — Z7982 Long term (current) use of aspirin: Secondary | ICD-10-CM | POA: Diagnosis not present

## 2016-10-01 DIAGNOSIS — K219 Gastro-esophageal reflux disease without esophagitis: Secondary | ICD-10-CM | POA: Diagnosis not present

## 2016-10-01 DIAGNOSIS — Z96651 Presence of right artificial knee joint: Secondary | ICD-10-CM | POA: Diagnosis not present

## 2016-10-01 DIAGNOSIS — Z9181 History of falling: Secondary | ICD-10-CM | POA: Diagnosis not present

## 2016-10-01 DIAGNOSIS — F329 Major depressive disorder, single episode, unspecified: Secondary | ICD-10-CM | POA: Diagnosis not present

## 2016-10-01 DIAGNOSIS — Z79891 Long term (current) use of opiate analgesic: Secondary | ICD-10-CM | POA: Diagnosis not present

## 2016-10-01 DIAGNOSIS — G43909 Migraine, unspecified, not intractable, without status migrainosus: Secondary | ICD-10-CM | POA: Diagnosis not present

## 2016-10-01 DIAGNOSIS — S82101K Unspecified fracture of upper end of right tibia, subsequent encounter for closed fracture with nonunion: Secondary | ICD-10-CM | POA: Diagnosis not present

## 2016-10-01 DIAGNOSIS — G4730 Sleep apnea, unspecified: Secondary | ICD-10-CM | POA: Diagnosis not present

## 2016-10-05 DIAGNOSIS — S82101K Unspecified fracture of upper end of right tibia, subsequent encounter for closed fracture with nonunion: Secondary | ICD-10-CM | POA: Diagnosis not present

## 2016-10-05 DIAGNOSIS — G4730 Sleep apnea, unspecified: Secondary | ICD-10-CM | POA: Diagnosis not present

## 2016-10-05 DIAGNOSIS — Z96651 Presence of right artificial knee joint: Secondary | ICD-10-CM | POA: Diagnosis not present

## 2016-10-05 DIAGNOSIS — Z7982 Long term (current) use of aspirin: Secondary | ICD-10-CM | POA: Diagnosis not present

## 2016-10-05 DIAGNOSIS — Z9181 History of falling: Secondary | ICD-10-CM | POA: Diagnosis not present

## 2016-10-05 DIAGNOSIS — G43909 Migraine, unspecified, not intractable, without status migrainosus: Secondary | ICD-10-CM | POA: Diagnosis not present

## 2016-10-05 DIAGNOSIS — K219 Gastro-esophageal reflux disease without esophagitis: Secondary | ICD-10-CM | POA: Diagnosis not present

## 2016-10-05 DIAGNOSIS — Z79891 Long term (current) use of opiate analgesic: Secondary | ICD-10-CM | POA: Diagnosis not present

## 2016-10-05 DIAGNOSIS — F329 Major depressive disorder, single episode, unspecified: Secondary | ICD-10-CM | POA: Diagnosis not present

## 2016-10-07 DIAGNOSIS — Z7982 Long term (current) use of aspirin: Secondary | ICD-10-CM | POA: Diagnosis not present

## 2016-10-07 DIAGNOSIS — S82101K Unspecified fracture of upper end of right tibia, subsequent encounter for closed fracture with nonunion: Secondary | ICD-10-CM | POA: Diagnosis not present

## 2016-10-07 DIAGNOSIS — G43909 Migraine, unspecified, not intractable, without status migrainosus: Secondary | ICD-10-CM | POA: Diagnosis not present

## 2016-10-07 DIAGNOSIS — Z96651 Presence of right artificial knee joint: Secondary | ICD-10-CM | POA: Diagnosis not present

## 2016-10-07 DIAGNOSIS — G4730 Sleep apnea, unspecified: Secondary | ICD-10-CM | POA: Diagnosis not present

## 2016-10-07 DIAGNOSIS — F329 Major depressive disorder, single episode, unspecified: Secondary | ICD-10-CM | POA: Diagnosis not present

## 2016-10-07 DIAGNOSIS — Z79891 Long term (current) use of opiate analgesic: Secondary | ICD-10-CM | POA: Diagnosis not present

## 2016-10-07 DIAGNOSIS — Z9181 History of falling: Secondary | ICD-10-CM | POA: Diagnosis not present

## 2016-10-07 DIAGNOSIS — K219 Gastro-esophageal reflux disease without esophagitis: Secondary | ICD-10-CM | POA: Diagnosis not present

## 2016-10-12 DIAGNOSIS — M1711 Unilateral primary osteoarthritis, right knee: Secondary | ICD-10-CM | POA: Diagnosis not present

## 2016-10-14 DIAGNOSIS — M1711 Unilateral primary osteoarthritis, right knee: Secondary | ICD-10-CM | POA: Diagnosis not present

## 2016-10-19 DIAGNOSIS — M1711 Unilateral primary osteoarthritis, right knee: Secondary | ICD-10-CM | POA: Diagnosis not present

## 2016-10-22 DIAGNOSIS — M1711 Unilateral primary osteoarthritis, right knee: Secondary | ICD-10-CM | POA: Diagnosis not present

## 2016-10-26 DIAGNOSIS — M1711 Unilateral primary osteoarthritis, right knee: Secondary | ICD-10-CM | POA: Diagnosis not present

## 2016-10-27 DIAGNOSIS — Z96651 Presence of right artificial knee joint: Secondary | ICD-10-CM | POA: Diagnosis not present

## 2016-10-27 DIAGNOSIS — Z471 Aftercare following joint replacement surgery: Secondary | ICD-10-CM | POA: Diagnosis not present

## 2016-10-29 DIAGNOSIS — M1711 Unilateral primary osteoarthritis, right knee: Secondary | ICD-10-CM | POA: Diagnosis not present

## 2016-11-02 DIAGNOSIS — M1711 Unilateral primary osteoarthritis, right knee: Secondary | ICD-10-CM | POA: Diagnosis not present

## 2016-11-05 DIAGNOSIS — M1711 Unilateral primary osteoarthritis, right knee: Secondary | ICD-10-CM | POA: Diagnosis not present

## 2017-02-24 ENCOUNTER — Ambulatory Visit (INDEPENDENT_AMBULATORY_CARE_PROVIDER_SITE_OTHER): Payer: PPO | Admitting: Cardiology

## 2017-02-24 ENCOUNTER — Encounter: Payer: Self-pay | Admitting: Cardiology

## 2017-02-24 VITALS — BP 118/76 | HR 72 | Ht 62.75 in | Wt 169.8 lb

## 2017-02-24 DIAGNOSIS — I340 Nonrheumatic mitral (valve) insufficiency: Secondary | ICD-10-CM | POA: Diagnosis not present

## 2017-02-24 DIAGNOSIS — R002 Palpitations: Secondary | ICD-10-CM | POA: Diagnosis not present

## 2017-02-24 NOTE — Progress Notes (Signed)
Cardiology Office Note   Date:  02/24/2017   ID:  BASSY FETTERLY, DOB 06-Aug-1945, MRN [id number]  PCP:  [name], MD  Cardiologist:  Constance Haw, MD    Chief Complaint  Patient presents with  . Follow-up    Palpitations     History of Present Illness: Anne Hogan is a 71 y.o. female who presents today for cardiology evaluation.   She has a history of mitral regurgitation and palpitations, possibly due to PVCs. Since last being seen she has done well. She has had a few episodes of palpitations, but mainly occur when she is lying flat. She is not aware of further episodes of palpitations.   Today, denies symptoms of chest pain, shortness of breath, orthopnea, PND, lower extremity edema, claudication, dizziness, presyncope, syncope, bleeding, or neurologic sequela. The patient is tolerating medications without difficulties and is otherwise without complaint today.    Past Medical History:  Diagnosis Date  . ADD (attention deficit disorder)   . Arthritis    "right knee" (04/19/2013)  . Claustrophobia   . Depression   . Fracture of right tibial plateau 05/2016  . GERD (gastroesophageal reflux disease)   . Heart murmur   . Migraine    "bad when I was in 5th or 6th grade; I can usually ward them off by resting my head now" (04/19/2013)  . MVP (mitral valve prolapse)   . Sleep apnea    has not used mouth guard in 5 years no cpap used   . Squamous carcinoma ?1999   "upper left groin" (04/19/2013)   Past Surgical History:  Procedure Laterality Date  . CHOLECYSTECTOMY  04/18/2013  . CHOLECYSTECTOMY N/A 04/18/2013   Procedure: LAPAROSCOPIC CHOLECYSTECTOMY WITH INTRAOPERATIVE CHOLANGIOGRAM;  Surgeon: Ralene Ok, MD;  Location: [facility];  Service: General;  Laterality: N/A;  . COLONOSCOPY W/ POLYPECTOMY    . EYE SURGERY Bilateral FALL  2016   IOC FOR CATARACTS  . KNEE ARTHROSCOPY Right 12/2007   "meniscus repair" (04/19/2013)  . RETINAL TEAR REPAIR CRYOTHERAPY Left ~  1997  . TONSILLECTOMY  ~ 1952  . TOTAL KNEE ARTHROPLASTY Right 09/21/2016   Procedure: RIGHT TOTAL KNEE ARTHROPLASTY;  Surgeon: Gaynelle Arabian, MD;  Location: [facility];  Service: Orthopedics;  Laterality: Right;  requests 22mins with abductor block  . VARICOSE VEIN SURGERY Right 2000's  . WRIST SURGERY Right 1975   "had a knot taken off" (04/19/2013)     Current Outpatient Prescriptions  Medication Sig Dispense Refill  . acetaminophen (TYLENOL) 500 MG tablet Take 500-1,000 mg by mouth every 6 (six) hours as needed for moderate pain (depends on pain if takes 1-2 tablets).    [name] alum hydroxide-mag trisilicate (GAVISCON) 62-95 MG CHEW chewable tablet Chew 2 tablets by mouth at bedtime as needed for indigestion or heartburn.    [name] aspirin 81 MG tablet Take 81 mg by mouth daily.    [name] buPROPion (WELLBUTRIN XL) 300 MG 24 hr tablet Take 300 mg by mouth daily.    . polyethylene glycol (MIRALAX / GLYCOLAX) packet Take 8.5 g by mouth daily.    . sodium chloride (OCEAN) 0.65 % SOLN nasal spray Place 1 spray into both nostrils as needed for congestion.     No current facility-administered medications for this visit.     Allergies:   Penicillins; Codeine; Milk-related compounds; and Morphine and related   Social History:  The patient  reports that she quit smoking about 30 years ago. Her smoking use  included Cigarettes. She has a 30.00 pack-year smoking history. She has never used smokeless tobacco. She reports that she drinks alcohol. She reports that she does not use drugs.   Family History:  The patient's family history includes Congestive Heart Failure in her father; Diabetes Mellitus II in her brother and father; Heart attack in her father; Hypertension in her mother; Osteoporosis in her mother; Sarcoidosis in her mother; Tremor in her mother.    ROS:  Please see the history of present illness.   Otherwise, review of systems is positive for none.   All other systems are reviewed and negative.    PHYSICAL EXAM: VS:  BP 118/76   Pulse 72   Ht 5' 2.75" (1.594 m)   Wt 169 lb 12.8 oz (77 kg)   SpO2 96%   BMI 30.32 kg/m  , BMI Body mass index is 30.32 kg/m. GEN: Well nourished, well developed, in no acute distress  HEENT: normal  Neck: no JVD, carotid bruits, or masses Cardiac: RRR; no murmurs, rubs, or gallops,no edema  Respiratory:  clear to auscultation bilaterally, normal work of breathing GI: soft, nontender, nondistended, + BS MS: no deformity or atrophy  Skin: warm and dry Neuro:  Strength and sensation are intact Psych: euthymic mood, full affect  EKG:  EKG is not ordered today. Personal review of the ekg ordered 06/18/16 shows sinus rhythm, PVC, APCs   Recent Labs: 09/16/2016: ALT 22 09/23/2016: BUN 13; Creatinine, Ser 0.93; Potassium 3.7; Sodium 141 09/24/2016: Hemoglobin 9.4; Platelets 219    Lipid Panel  No results found for: CHOL, TRIG, HDL, CHOLHDL, VLDL, LDLCALC, LDLDIRECT   Wt Readings from Last 3 Encounters:  02/24/17 169 lb 12.8 oz (77 kg)  09/21/16 163 lb 6 oz (74.1 kg)  09/16/16 163 lb 6.4 oz (74.1 kg)      Other studies Reviewed: Additional studies/ records that were reviewed today include: TTE 09/15/16  - Left ventricle: The cavity size was normal. Wall thickness was   normal. Systolic function was normal. The estimated ejection   fraction was in the range of 60% to 65%. Left ventricular   diastolic function parameters were normal. - Aortic valve: There was trivial regurgitation. - Mitral valve: Marked myxomatous changes to both leaflets with   moderate bileaflet prolapse. Color flow only done in 4 chamber   view   and MR appears moderate Patient should return for more thorough   interrogation of MR with color flow and PISA. Asked   to read echo not done on 2/23. - Left atrium: The atrium was moderately dilated. - Right atrium: The atrium was mildly dilated. - Atrial septum: No defect or patent foramen ovale was identified.  Carotid  artery duplex 10/16/15 No evidence of carotid artery stenosis bilateral  ASSESSMENT AND PLAN:  1.  Mitral valve regurgitation: Likely caused by her mitral valve prolapse. We'll plan for an echocardiogram at her next visit. She is currently asymptomatic without shortness of breath.  2. Palpitations: Currently having only minor episodes at night. She is feeling well currently. We'll make no further medication changes.    Current medicines are reviewed at length with the patient today.   The patient does not have concerns regarding her medicines.  The following changes were made today:  None  Labs/ tests ordered today include:  No orders of the defined types were placed in this encounter.    Disposition:   FU with Kla Bily 6 months  Signed, Ketsia Linebaugh Meredith Leeds, MD  02/24/2017  10:57 AM     [facility] [address] [place] [address] [phone] (office) [phone] (fax)

## 2017-02-24 NOTE — Patient Instructions (Signed)
Your physician wants you to follow-up in: [place] will receive a reminder letter in the mail two months in advance. If you don't receive a letter, please call our office to schedule the follow-up appointment.   If you need a refill on your cardiac medications before your next appointment, please call your pharmacy.

## 2017-02-25 DIAGNOSIS — H26493 Other secondary cataract, bilateral: Secondary | ICD-10-CM | POA: Diagnosis not present

## 2017-03-04 DIAGNOSIS — E784 Other hyperlipidemia: Secondary | ICD-10-CM | POA: Diagnosis not present

## 2017-03-04 DIAGNOSIS — R8299 Other abnormal findings in urine: Secondary | ICD-10-CM | POA: Diagnosis not present

## 2017-03-04 DIAGNOSIS — M859 Disorder of bone density and structure, unspecified: Secondary | ICD-10-CM | POA: Diagnosis not present

## 2017-03-04 DIAGNOSIS — N390 Urinary tract infection, site not specified: Secondary | ICD-10-CM | POA: Diagnosis not present

## 2017-03-04 DIAGNOSIS — R7301 Impaired fasting glucose: Secondary | ICD-10-CM | POA: Diagnosis not present

## 2017-03-11 DIAGNOSIS — Z1389 Encounter for screening for other disorder: Secondary | ICD-10-CM | POA: Diagnosis not present

## 2017-03-11 DIAGNOSIS — F39 Unspecified mood [affective] disorder: Secondary | ICD-10-CM | POA: Diagnosis not present

## 2017-03-11 DIAGNOSIS — G4733 Obstructive sleep apnea (adult) (pediatric): Secondary | ICD-10-CM | POA: Diagnosis not present

## 2017-03-11 DIAGNOSIS — I341 Nonrheumatic mitral (valve) prolapse: Secondary | ICD-10-CM | POA: Diagnosis not present

## 2017-03-11 DIAGNOSIS — M179 Osteoarthritis of knee, unspecified: Secondary | ICD-10-CM | POA: Diagnosis not present

## 2017-03-11 DIAGNOSIS — E784 Other hyperlipidemia: Secondary | ICD-10-CM | POA: Diagnosis not present

## 2017-03-11 DIAGNOSIS — Z0000 Encounter for general adult medical examination without abnormal findings: Secondary | ICD-10-CM | POA: Diagnosis not present

## 2017-03-11 DIAGNOSIS — J302 Other seasonal allergic rhinitis: Secondary | ICD-10-CM | POA: Diagnosis not present

## 2017-03-11 DIAGNOSIS — Z6830 Body mass index (BMI) 30.0-30.9, adult: Secondary | ICD-10-CM | POA: Diagnosis not present

## 2017-03-11 DIAGNOSIS — M859 Disorder of bone density and structure, unspecified: Secondary | ICD-10-CM | POA: Diagnosis not present

## 2017-03-11 DIAGNOSIS — R7301 Impaired fasting glucose: Secondary | ICD-10-CM | POA: Diagnosis not present

## 2017-05-18 DIAGNOSIS — Z23 Encounter for immunization: Secondary | ICD-10-CM | POA: Diagnosis not present

## 2017-05-24 DIAGNOSIS — H26492 Other secondary cataract, left eye: Secondary | ICD-10-CM | POA: Diagnosis not present

## 2017-07-28 NOTE — Progress Notes (Deleted)
Electrophysiology Office Note Date: 07/28/2017  ID:  Anne Hogan, DOB 04/14/1946, MRN [id number]  PCP: [name], MD Electrophysiologist: Curt Bears  CC: chest pain  Anne Hogan is a 72 y.o. female seen today for Dr Curt Bears.  She asked to be seen today for evaluation of chest pain.  ***  She denies chest pain, palpitations, dyspnea, PND, orthopnea, nausea, vomiting, dizziness, syncope, edema, weight gain, or early satiety.  Past Medical History:  Diagnosis Date  . ADD (attention deficit disorder)   . Arthritis    "right knee" (04/19/2013)  . Claustrophobia   . Depression   . Fracture of right tibial plateau 05/2016  . GERD (gastroesophageal reflux disease)   . Heart murmur   . Migraine    "bad when I was in 5th or 6th grade; I can usually ward them off by resting my head now" (04/19/2013)  . MVP (mitral valve prolapse)   . Sleep apnea    has not used mouth guard in 5 years no cpap used   . Squamous carcinoma ?1999   "upper left groin" (04/19/2013)   Past Surgical History:  Procedure Laterality Date  . CHOLECYSTECTOMY  04/18/2013  . CHOLECYSTECTOMY N/A 04/18/2013   Procedure: LAPAROSCOPIC CHOLECYSTECTOMY WITH INTRAOPERATIVE CHOLANGIOGRAM;  Surgeon: Ralene Ok, MD;  Location: [facility];  Service: General;  Laterality: N/A;  . COLONOSCOPY W/ POLYPECTOMY    . EYE SURGERY Bilateral FALL  2016   IOC FOR CATARACTS  . KNEE ARTHROSCOPY Right 12/2007   "meniscus repair" (04/19/2013)  . RETINAL TEAR REPAIR CRYOTHERAPY Left ~ 1997  . TONSILLECTOMY  ~ 1952  . TOTAL KNEE ARTHROPLASTY Right 09/21/2016   Procedure: RIGHT TOTAL KNEE ARTHROPLASTY;  Surgeon: Gaynelle Arabian, MD;  Location: [facility];  Service: Orthopedics;  Laterality: Right;  requests 28mins with abductor block  . VARICOSE VEIN SURGERY Right 2000's  . WRIST SURGERY Right 1975   "had a knot taken off" (04/19/2013)    Current Outpatient Medications  Medication Sig Dispense Refill  . acetaminophen (TYLENOL) 500 MG tablet  Take 500-1,000 mg by mouth every 6 (six) hours as needed for moderate pain (depends on pain if takes 1-2 tablets).    [name] alum hydroxide-mag trisilicate (GAVISCON) 79-89 MG CHEW chewable tablet Chew 2 tablets by mouth at bedtime as needed for indigestion or heartburn.    [name] aspirin 81 MG tablet Take 81 mg by mouth daily.    [name] buPROPion (WELLBUTRIN XL) 300 MG 24 hr tablet Take 300 mg by mouth daily.    . polyethylene glycol (MIRALAX / GLYCOLAX) packet Take 8.5 g by mouth daily.    . sodium chloride (OCEAN) 0.65 % SOLN nasal spray Place 1 spray into both nostrils as needed for congestion.     No current facility-administered medications for this visit.     Allergies:   Penicillins; Codeine; Milk-related compounds; and Morphine and related   Social History: Social History   Socioeconomic History  . Marital status: Married    Spouse name: Not on file  . Number of children: Not on file  . Years of education: Not on file  . Highest education level: Not on file  Social Needs  . Financial resource strain: Not on file  . Food insecurity - worry: Not on file  . Food insecurity - inability: Not on file  . Transportation needs - medical: Not on file  . Transportation needs - non-medical: Not on file  Occupational History  . Not on file  Tobacco Use  . Smoking status: Former Smoker    Packs/day: 1.50    Years: 20.00    Pack years: 30.00    Types: Cigarettes    Last attempt to quit: 11/30/1986    Years since quitting: 30.6  . Smokeless tobacco: Never Used  Substance and Sexual Activity  . Alcohol use: Yes    Comment: RARE  . Drug use: No  . Sexual activity: No  Other Topics Concern  . Not on file  Social History Narrative  . Not on file    Family History: Family History  Problem Relation Age of Onset  . Tremor Mother   . Osteoporosis Mother   . Sarcoidosis Mother   . Hypertension Mother   . Congestive Heart Failure Father   . Heart attack Father   . Diabetes Mellitus II Father    . Diabetes Mellitus II Brother     Review of Systems: All other systems reviewed and are otherwise negative except as noted above.   Physical Exam: VS:  There were no vitals taken for this visit. , BMI There is no height or weight on file to calculate BMI. Wt Readings from Last 3 Encounters:  02/24/17 169 lb 12.8 oz (77 kg)  09/21/16 163 lb 6 oz (74.1 kg)  09/16/16 163 lb 6.4 oz (74.1 kg)    GEN- The patient is well appearing, alert and oriented x 3 today.   HEENT: normocephalic, atraumatic; sclera clear, conjunctiva pink; hearing intact; oropharynx clear; neck supple, no JVP Lymph- no cervical lymphadenopathy Lungs- Clear to ausculation bilaterally, normal work of breathing.  No wheezes, rales, rhonchi Heart- Regular rate and rhythm, no murmurs, rubs or gallops, PMI not laterally displaced GI- soft, non-tender, non-distended, bowel sounds present, no hepatosplenomegaly Extremities- no clubbing, cyanosis, or edema; DP/PT/radial pulses 2+ bilaterally MS- no significant deformity or atrophy Skin- warm and dry, no rash or lesion  Psych- euthymic mood, full affect Neuro- strength and sensation are intact   EKG:  EKG is ordered today. The ekg ordered today shows ***  Recent Labs: 09/16/2016: ALT 22 09/23/2016: BUN 13; Creatinine, Ser 0.93; Potassium 3.7; Sodium 141 09/24/2016: Hemoglobin 9.4; Platelets 219    Other studies Reviewed: Additional studies/ records that were reviewed today include: Dr Curt Bears' office notes  Assessment and Plan: 1.  Chest pain ***  2.  Palpitations ***   Current medicines are reviewed at length with the patient today.   The patient does not have concerns regarding her medicines.  The following changes were made today:  none  Labs/ tests ordered today include: none No orders of the defined types were placed in this encounter.    Disposition:   Follow up with ***    Signed, Chanetta Marshall, NP 07/28/2017 6:08 AM   [place]  [place] [address] [phone] (office) [phone] (fax)

## 2017-07-30 ENCOUNTER — Ambulatory Visit: Payer: PPO | Admitting: Nurse Practitioner

## 2017-08-24 NOTE — Progress Notes (Signed)
Cardiology Office Note   Date:  08/25/2017   ID:  Anne Hogan, DOB 1946-03-31, MRN [id number]  PCP:  [name], MD  Cardiologist:  Constance Haw, MD    Chief Complaint  Patient presents with  . Follow-up    Palpitations     History of Present Illness: Anne Hogan is a 72 y.o. female who presents today for cardiology evaluation.   She has a history of mitral regurgitation and palpitations, possibly due to PVCs. Since last being seen she has done well. She has had a few episodes of palpitations, but mainly occur when she is lying flat. She is not aware of further episodes of palpitations.  Today, denies symptoms of palpitations, chest pain, shortness of breath, orthopnea, PND, lower extremity edema, claudication, dizziness, presyncope, syncope, bleeding, or neurologic sequela. The patient is tolerating medications without difficulties.  Anne Hogan feels well without major complaint.  She has had no chest pain or shortness of breath.  She is having a bout of depression as her husband recently passed away.  He had been sick for many years with a tongue base cancer.  He had been on a feeding tube since 2013.  Past Medical History:  Diagnosis Date  . ADD (attention deficit disorder)   . Arthritis    "right knee" (04/19/2013)  . Claustrophobia   . Depression   . Fracture of right tibial plateau 05/2016  . GERD (gastroesophageal reflux disease)   . Heart murmur   . Migraine    "bad when I was in 5th or 6th grade; I can usually ward them off by resting my head now" (04/19/2013)  . MVP (mitral valve prolapse)   . Sleep apnea    has not used mouth guard in 5 years no cpap used   . Squamous carcinoma ?1999   "upper left groin" (04/19/2013)   Past Surgical History:  Procedure Laterality Date  . CHOLECYSTECTOMY  04/18/2013  . CHOLECYSTECTOMY N/A 04/18/2013   Procedure: LAPAROSCOPIC CHOLECYSTECTOMY WITH INTRAOPERATIVE CHOLANGIOGRAM;  Surgeon: Ralene Ok, MD;  Location: [facility];  Service: General;  Laterality: N/A;  . COLONOSCOPY W/ POLYPECTOMY    . EYE SURGERY Bilateral FALL  2016   IOC FOR CATARACTS  . KNEE ARTHROSCOPY Right 12/2007   "meniscus repair" (04/19/2013)  . RETINAL TEAR REPAIR CRYOTHERAPY Left ~ 1997  . TONSILLECTOMY  ~ 1952  . TOTAL KNEE ARTHROPLASTY Right 09/21/2016   Procedure: RIGHT TOTAL KNEE ARTHROPLASTY;  Surgeon: Gaynelle Arabian, MD;  Location: [facility];  Service: Orthopedics;  Laterality: Right;  requests 27mins with abductor block  . VARICOSE VEIN SURGERY Right 2000's  . WRIST SURGERY Right 1975   "had a knot taken off" (04/19/2013)     Current Outpatient Medications  Medication Sig Dispense Refill  . acetaminophen (TYLENOL) 500 MG tablet Take 500-1,000 mg by mouth every 6 (six) hours as needed for moderate pain (depends on pain if takes 1-2 tablets).    [name] alum hydroxide-mag trisilicate (GAVISCON) 96-78 MG CHEW chewable tablet Chew 2 tablets by mouth at bedtime as needed for indigestion or heartburn.    [name] aspirin 81 MG tablet Take 81 mg by mouth daily.    [name] buPROPion (WELLBUTRIN XL) 300 MG 24 hr tablet Take 300 mg by mouth daily.    Anne Hogan 575 MG/5ML SYRP Take as directed    . polyethylene glycol (MIRALAX / GLYCOLAX) packet Take 8.5 g by mouth daily.    . sodium chloride (OCEAN) 0.65 % SOLN  nasal spray Place 1 spray into both nostrils as needed for congestion.     No current facility-administered medications for this visit.     Allergies:   Penicillins; Codeine; Milk-related compounds; and Morphine and related   Social History:  The patient  reports that she quit smoking about 30 years ago. Her smoking use included cigarettes. She has a 30.00 pack-year smoking history. she has never used smokeless tobacco. She reports that she drinks alcohol. She reports that she does not use drugs.   Family History:  The patient's family history includes Congestive Heart Failure in her father; Diabetes Mellitus II in her brother and father; Heart  attack in her father; Hypertension in her mother; Osteoporosis in her mother; Sarcoidosis in her mother; Tremor in her mother.    ROS:  Please see the history of present illness.   Otherwise, review of systems is positive for depression.   All other systems are reviewed and negative.   PHYSICAL EXAM: VS:  BP 134/80   Pulse 65   Ht 5\' 3"  (1.6 m)   Wt 164 lb (74.4 kg)   BMI 29.05 kg/m  , BMI Body mass index is 29.05 kg/m. GEN: Well nourished, well developed, in no acute distress  HEENT: normal  Neck: no JVD, carotid bruits, or masses Cardiac: RRR; no murmurs, rubs, or gallops,no edema  Respiratory:  clear to auscultation bilaterally, normal work of breathing GI: soft, nontender, nondistended, + BS MS: no deformity or atrophy  Skin: warm and dry Neuro:  Strength and sensation are intact Psych: euthymic mood, full affect  EKG:  EKG is ordered today. Personal review of the ekg ordered shows SR, rate 65, inferolateral TWI    Recent Labs: 09/16/2016: ALT 22 09/23/2016: BUN 13; Creatinine, Ser 0.93; Potassium 3.7; Sodium 141 09/24/2016: Hemoglobin 9.4; Platelets 219    Lipid Panel  No results found for: CHOL, TRIG, HDL, CHOLHDL, VLDL, LDLCALC, LDLDIRECT   Wt Readings from Last 3 Encounters:  08/25/17 164 lb (74.4 kg)  02/24/17 169 lb 12.8 oz (77 kg)  09/21/16 163 lb 6 oz (74.1 kg)      Other studies Reviewed: Additional studies/ records that were reviewed today include: TTE 09/15/16  - Left ventricle: The cavity size was normal. Wall thickness was   normal. Systolic function was normal. The estimated ejection   fraction was in the range of 60% to 65%. Left ventricular   diastolic function parameters were normal. - Aortic valve: There was trivial regurgitation. - Mitral valve: Marked myxomatous changes to both leaflets with   moderate bileaflet prolapse. Color flow only done in 4 chamber   view   and MR appears moderate Patient should return for more thorough   interrogation  of MR with color flow and PISA. Asked   to read echo not done on 2/23. - Left atrium: The atrium was moderately dilated. - Right atrium: The atrium was mildly dilated. - Atrial septum: No defect or patent foramen ovale was identified.  Carotid artery duplex 10/16/15 No evidence of carotid artery stenosis bilateral  ASSESSMENT AND PLAN:  1.  Mitral valve regurgitation: Likely caused by mitral valve prolapse.  Is not having any shortness of breath.  Was moderate at last echocardiogram 1 year ago.  We Anne Hogan continue to monitor and get a repeat echo in 1 year.  2. Palpitations: Still having minor episodes of palpitations.  She is not overtly worried about these.  She is feeling well.  No changes.   Current medicines  are reviewed at length with the patient today.   The patient does not have concerns regarding her medicines.  The following changes were made today: None  Labs/ tests ordered today include:  Orders Placed This Encounter  Procedures  . EKG 12-Lead  . ECHOCARDIOGRAM COMPLETE     Disposition:   FU with Anne Hogan 12 months  Signed, Anne Hogan Meredith Leeds, MD  08/25/2017 1:43 PM     [place] [address] [phone] (office) [phone] (fax)

## 2017-08-25 ENCOUNTER — Encounter: Payer: Self-pay | Admitting: Cardiology

## 2017-08-25 ENCOUNTER — Ambulatory Visit: Payer: PPO | Admitting: Cardiology

## 2017-08-25 VITALS — BP 134/80 | HR 65 | Ht 63.0 in | Wt 164.0 lb

## 2017-08-25 DIAGNOSIS — I341 Nonrheumatic mitral (valve) prolapse: Secondary | ICD-10-CM

## 2017-08-25 DIAGNOSIS — R002 Palpitations: Secondary | ICD-10-CM | POA: Diagnosis not present

## 2017-08-25 NOTE — Patient Instructions (Signed)
Medication Instructions:  Your physician recommends that you continue on your current medications as directed. Please refer to the Current Medication list given to you today.  * If you need a refill on your cardiac medications before your next appointment, please call your pharmacy. *  Labwork: None ordered  Testing/Procedures: Your physician has requested that you have an echocardiogram in 1 year - before your follow up with Dr. Curt Bears. Echocardiography is a painless test that uses sound waves to create images of your heart. It provides your doctor with information about the size and shape of your heart and how well your heart's chambers and valves are working. This procedure takes approximately one hour. There are no restrictions for this procedure.  Follow-Up: Your physician wants you to follow-up in: 1 year with Dr. Curt Bears - after you have completed your echocardiogram.  You will receive a reminder letter in the mail two months in advance. If you don't receive a letter, please call our office to schedule the follow-up appointment.  Thank you for choosing [facility]!!   Trinidad Curet, RN [phone]  Any Other Special Instructions Will Be Listed Below (If Applicable).

## 2017-09-21 ENCOUNTER — Other Ambulatory Visit: Payer: Self-pay | Admitting: Obstetrics and Gynecology

## 2017-09-21 DIAGNOSIS — Z1231 Encounter for screening mammogram for malignant neoplasm of breast: Secondary | ICD-10-CM

## 2017-09-28 ENCOUNTER — Other Ambulatory Visit: Payer: Self-pay | Admitting: Ophthalmology

## 2017-09-28 DIAGNOSIS — G453 Amaurosis fugax: Secondary | ICD-10-CM

## 2017-09-30 ENCOUNTER — Other Ambulatory Visit: Payer: PPO

## 2017-10-01 ENCOUNTER — Ambulatory Visit
Admission: RE | Admit: 2017-10-01 | Discharge: 2017-10-01 | Disposition: A | Discharge status: Home / Self Care | Payer: PPO | Source: Ambulatory Visit | Attending: Ophthalmology | Admitting: Ophthalmology

## 2017-10-01 DIAGNOSIS — G453 Amaurosis fugax: Secondary | ICD-10-CM

## 2017-10-01 DIAGNOSIS — I6523 Occlusion and stenosis of bilateral carotid arteries: Secondary | ICD-10-CM | POA: Diagnosis not present

## 2017-10-08 ENCOUNTER — Ambulatory Visit
Admission: RE | Admit: 2017-10-08 | Discharge: 2017-10-08 | Disposition: A | Discharge status: Home / Self Care | Payer: PPO | Source: Ambulatory Visit | Attending: Obstetrics and Gynecology | Admitting: Obstetrics and Gynecology

## 2017-10-08 DIAGNOSIS — Z1231 Encounter for screening mammogram for malignant neoplasm of breast: Secondary | ICD-10-CM | POA: Diagnosis not present

## 2017-10-14 DIAGNOSIS — R350 Frequency of micturition: Secondary | ICD-10-CM | POA: Diagnosis not present

## 2017-10-14 DIAGNOSIS — N390 Urinary tract infection, site not specified: Secondary | ICD-10-CM | POA: Diagnosis not present

## 2017-10-14 DIAGNOSIS — Z6830 Body mass index (BMI) 30.0-30.9, adult: Secondary | ICD-10-CM | POA: Diagnosis not present

## 2017-11-10 DIAGNOSIS — H1013 Acute atopic conjunctivitis, bilateral: Secondary | ICD-10-CM | POA: Diagnosis not present

## 2017-11-24 DIAGNOSIS — H9193 Unspecified hearing loss, bilateral: Secondary | ICD-10-CM | POA: Diagnosis not present

## 2017-11-24 DIAGNOSIS — J302 Other seasonal allergic rhinitis: Secondary | ICD-10-CM | POA: Diagnosis not present

## 2017-11-24 DIAGNOSIS — H9313 Tinnitus, bilateral: Secondary | ICD-10-CM | POA: Diagnosis not present

## 2017-11-24 DIAGNOSIS — Z6829 Body mass index (BMI) 29.0-29.9, adult: Secondary | ICD-10-CM | POA: Diagnosis not present

## 2017-11-24 DIAGNOSIS — J029 Acute pharyngitis, unspecified: Secondary | ICD-10-CM | POA: Diagnosis not present

## 2017-12-10 DIAGNOSIS — H903 Sensorineural hearing loss, bilateral: Secondary | ICD-10-CM | POA: Diagnosis not present

## 2017-12-15 DIAGNOSIS — S90859A Superficial foreign body, unspecified foot, initial encounter: Secondary | ICD-10-CM | POA: Diagnosis not present

## 2017-12-15 DIAGNOSIS — Z6828 Body mass index (BMI) 28.0-28.9, adult: Secondary | ICD-10-CM | POA: Diagnosis not present

## 2017-12-21 DIAGNOSIS — H903 Sensorineural hearing loss, bilateral: Secondary | ICD-10-CM | POA: Diagnosis not present

## 2017-12-29 DIAGNOSIS — F39 Unspecified mood [affective] disorder: Secondary | ICD-10-CM | POA: Diagnosis not present

## 2017-12-29 DIAGNOSIS — F909 Attention-deficit hyperactivity disorder, unspecified type: Secondary | ICD-10-CM | POA: Diagnosis not present

## 2018-01-05 DIAGNOSIS — F909 Attention-deficit hyperactivity disorder, unspecified type: Secondary | ICD-10-CM | POA: Diagnosis not present

## 2018-03-09 DIAGNOSIS — H26491 Other secondary cataract, right eye: Secondary | ICD-10-CM | POA: Diagnosis not present

## 2018-03-29 DIAGNOSIS — R82998 Other abnormal findings in urine: Secondary | ICD-10-CM | POA: Diagnosis not present

## 2018-03-29 DIAGNOSIS — R7301 Impaired fasting glucose: Secondary | ICD-10-CM | POA: Diagnosis not present

## 2018-03-29 DIAGNOSIS — M859 Disorder of bone density and structure, unspecified: Secondary | ICD-10-CM | POA: Diagnosis not present

## 2018-03-29 DIAGNOSIS — E7849 Other hyperlipidemia: Secondary | ICD-10-CM | POA: Diagnosis not present

## 2018-03-29 DIAGNOSIS — R5383 Other fatigue: Secondary | ICD-10-CM | POA: Diagnosis not present

## 2018-04-05 DIAGNOSIS — F39 Unspecified mood [affective] disorder: Secondary | ICD-10-CM | POA: Diagnosis not present

## 2018-04-05 DIAGNOSIS — Z6828 Body mass index (BMI) 28.0-28.9, adult: Secondary | ICD-10-CM | POA: Diagnosis not present

## 2018-04-05 DIAGNOSIS — E7849 Other hyperlipidemia: Secondary | ICD-10-CM | POA: Diagnosis not present

## 2018-04-05 DIAGNOSIS — F909 Attention-deficit hyperactivity disorder, unspecified type: Secondary | ICD-10-CM | POA: Diagnosis not present

## 2018-04-05 DIAGNOSIS — Z1389 Encounter for screening for other disorder: Secondary | ICD-10-CM | POA: Diagnosis not present

## 2018-04-05 DIAGNOSIS — M859 Disorder of bone density and structure, unspecified: Secondary | ICD-10-CM | POA: Diagnosis not present

## 2018-04-05 DIAGNOSIS — J302 Other seasonal allergic rhinitis: Secondary | ICD-10-CM | POA: Diagnosis not present

## 2018-04-05 DIAGNOSIS — I341 Nonrheumatic mitral (valve) prolapse: Secondary | ICD-10-CM | POA: Diagnosis not present

## 2018-04-05 DIAGNOSIS — R7301 Impaired fasting glucose: Secondary | ICD-10-CM | POA: Diagnosis not present

## 2018-04-05 DIAGNOSIS — G4733 Obstructive sleep apnea (adult) (pediatric): Secondary | ICD-10-CM | POA: Diagnosis not present

## 2018-04-05 DIAGNOSIS — Z0000 Encounter for general adult medical examination without abnormal findings: Secondary | ICD-10-CM | POA: Diagnosis not present

## 2018-04-05 DIAGNOSIS — K219 Gastro-esophageal reflux disease without esophagitis: Secondary | ICD-10-CM | POA: Diagnosis not present

## 2018-05-06 DIAGNOSIS — L821 Other seborrheic keratosis: Secondary | ICD-10-CM | POA: Diagnosis not present

## 2018-05-06 DIAGNOSIS — L720 Epidermal cyst: Secondary | ICD-10-CM | POA: Diagnosis not present

## 2018-05-06 DIAGNOSIS — L814 Other melanin hyperpigmentation: Secondary | ICD-10-CM | POA: Diagnosis not present

## 2018-05-18 DIAGNOSIS — R05 Cough: Secondary | ICD-10-CM | POA: Diagnosis not present

## 2018-05-18 DIAGNOSIS — J45909 Unspecified asthma, uncomplicated: Secondary | ICD-10-CM | POA: Diagnosis not present

## 2018-05-31 DIAGNOSIS — Z6829 Body mass index (BMI) 29.0-29.9, adult: Secondary | ICD-10-CM | POA: Diagnosis not present

## 2018-05-31 DIAGNOSIS — R05 Cough: Secondary | ICD-10-CM | POA: Diagnosis not present

## 2018-05-31 DIAGNOSIS — J40 Bronchitis, not specified as acute or chronic: Secondary | ICD-10-CM | POA: Diagnosis not present

## 2018-05-31 DIAGNOSIS — J069 Acute upper respiratory infection, unspecified: Secondary | ICD-10-CM | POA: Diagnosis not present

## 2018-06-08 ENCOUNTER — Telehealth: Payer: Self-pay | Admitting: Cardiology

## 2018-06-08 DIAGNOSIS — R002 Palpitations: Secondary | ICD-10-CM

## 2018-06-08 NOTE — Telephone Encounter (Signed)
New message:   Patient calling she would like to have a sooner appt patient states she is not feeling well.

## 2018-06-08 NOTE — Telephone Encounter (Signed)
Pt calling in "to be checked". Reports that she has been waking up at night w/ palpitations. She doesn't feel that the HRs are elevated, but she can feel the palpitations. Pt aware I will forward to [facility] for advisement.   Mentioned to pt that he may recommend a monitor to determine what be the cause of her palpitations. Given rationale that if issue is occurring at night then I won't pick it up on an EKG during the day and that a monitor may be helpful before scheduling an appt. Pt states that she has worn one before and agreeable to wearing again if he feels is the right step.

## 2018-06-09 DIAGNOSIS — L290 Pruritus ani: Secondary | ICD-10-CM | POA: Diagnosis not present

## 2018-06-09 NOTE — Telephone Encounter (Signed)
Advised pt Dr. Curt Bears recommends monitor to determine rhythm when experiencing palpitations.   Also discussed possible LINQ implant to monitor - but pt would like to hold off on that idea for now.  Agreeable to re-discussing at a later date if needed. Pt is agreeable to plan. Aware that office will call her to arrange a 2 week monitor. She appreciates our help.

## 2018-06-14 DIAGNOSIS — H26491 Other secondary cataract, right eye: Secondary | ICD-10-CM | POA: Diagnosis not present

## 2018-06-15 ENCOUNTER — Ambulatory Visit (INDEPENDENT_AMBULATORY_CARE_PROVIDER_SITE_OTHER): Payer: PPO

## 2018-06-15 DIAGNOSIS — R002 Palpitations: Secondary | ICD-10-CM

## 2018-06-23 DIAGNOSIS — Z01419 Encounter for gynecological examination (general) (routine) without abnormal findings: Secondary | ICD-10-CM | POA: Diagnosis not present

## 2018-06-27 DIAGNOSIS — H26491 Other secondary cataract, right eye: Secondary | ICD-10-CM | POA: Diagnosis not present

## 2018-07-12 ENCOUNTER — Emergency Department (HOSPITAL_COMMUNITY)
Admission: EM | Admit: 2018-07-12 | Discharge: 2018-07-12 | Disposition: A | Discharge status: Home / Self Care | Payer: PPO | Attending: Emergency Medicine | Admitting: Emergency Medicine

## 2018-07-12 ENCOUNTER — Other Ambulatory Visit: Payer: Self-pay

## 2018-07-12 ENCOUNTER — Encounter (HOSPITAL_COMMUNITY): Payer: Self-pay

## 2018-07-12 DIAGNOSIS — R55 Syncope and collapse: Secondary | ICD-10-CM | POA: Diagnosis not present

## 2018-07-12 DIAGNOSIS — Z7982 Long term (current) use of aspirin: Secondary | ICD-10-CM | POA: Diagnosis not present

## 2018-07-12 DIAGNOSIS — R531 Weakness: Secondary | ICD-10-CM | POA: Diagnosis not present

## 2018-07-12 DIAGNOSIS — Z79899 Other long term (current) drug therapy: Secondary | ICD-10-CM | POA: Diagnosis not present

## 2018-07-12 DIAGNOSIS — R112 Nausea with vomiting, unspecified: Secondary | ICD-10-CM | POA: Diagnosis not present

## 2018-07-12 DIAGNOSIS — R42 Dizziness and giddiness: Secondary | ICD-10-CM | POA: Diagnosis not present

## 2018-07-12 DIAGNOSIS — R61 Generalized hyperhidrosis: Secondary | ICD-10-CM | POA: Diagnosis not present

## 2018-07-12 DIAGNOSIS — Z87891 Personal history of nicotine dependence: Secondary | ICD-10-CM | POA: Insufficient documentation

## 2018-07-12 LAB — CBC WITH DIFFERENTIAL/PLATELET
Abs Immature Granulocytes: 0.03 10*3/uL (ref 0.00–0.07)
Basophils Absolute: 0 10*3/uL (ref 0.0–0.1)
Basophils Relative: 1 %
Eosinophils Absolute: 0.1 10*3/uL (ref 0.0–0.5)
Eosinophils Relative: 2 %
HCT: 38 % (ref 36.0–46.0)
Hemoglobin: 12.3 g/dL (ref 12.0–15.0)
Immature Granulocytes: 1 %
Lymphocytes Relative: 18 %
Lymphs Abs: 1.1 10*3/uL (ref 0.7–4.0)
MCH: 29.7 pg (ref 26.0–34.0)
MCHC: 32.4 g/dL (ref 30.0–36.0)
MCV: 91.8 fL (ref 80.0–100.0)
Monocytes Absolute: 0.5 10*3/uL (ref 0.1–1.0)
Monocytes Relative: 9 %
Neutro Abs: 4.1 10*3/uL (ref 1.7–7.7)
Neutrophils Relative %: 69 %
Platelets: 274 10*3/uL (ref 150–400)
RBC: 4.14 MIL/uL (ref 3.87–5.11)
RDW: 12.4 % (ref 11.5–15.5)
WBC: 5.9 10*3/uL (ref 4.0–10.5)
nRBC: 0 % (ref 0.0–0.2)

## 2018-07-12 LAB — I-STAT TROPONIN, ED: Troponin i, poc: 0.01 ng/mL (ref 0.00–0.08)

## 2018-07-12 LAB — BASIC METABOLIC PANEL
Anion gap: 8 (ref 5–15)
BUN: 13 mg/dL (ref 8–23)
CO2: 21 mmol/L — ABNORMAL LOW (ref 22–32)
Calcium: 8.5 mg/dL — ABNORMAL LOW (ref 8.9–10.3)
Chloride: 111 mmol/L (ref 98–111)
Creatinine, Ser: 0.92 mg/dL (ref 0.44–1.00)
GFR calc Af Amer: >60 mL/min (ref >60)
GFR calc non Af Amer: >60 mL/min (ref >60)
Glucose, Bld: 108 mg/dL — ABNORMAL HIGH (ref 70–99)
Potassium: 3.8 mmol/L (ref 3.5–5.1)
Sodium: 140 mmol/L (ref 135–145)

## 2018-07-12 NOTE — Discharge Instructions (Signed)
Your work-up today was reassuring.  Drink plenty of fluids and get plenty of rest.  Make sure that you are eating on a regular basis.  Follow-up with your primary care physician or cardiologist for reevaluation of your symptoms and especially to go over the results of your Holter monitoring that was completed recently.  Return to the emergency department immediately for any concerning signs or symptoms develop such as passing out, chest pains, shortness of breath, high fevers, or persistent vomiting.

## 2018-07-12 NOTE — ED Notes (Signed)
ED Provider at bedside. 

## 2018-07-12 NOTE — ED Triage Notes (Signed)
Per [facility], pt coming from nail salon where she was sitting and became hot, sweaty, and weak all over. Per staff, pt did not have any loss of consciousness. Pt was clammy and diaphoretic on ems arrival. EMS reports color has improved since arriving to ER. Per pt, fumes were strong in salon. Pt is alert and oriented. Pt denies any pain.

## 2018-07-12 NOTE — ED Provider Notes (Signed)
Medical screening examination/treatment/procedure(s) were conducted as a shared visit with non-physician practitioner(s) and myself.  I personally evaluated the patient during the encounter.  EKG Interpretation  Date/Time:  Tuesday July 12 2018 11:19:19 EST Ventricular Rate:  58 PR Interval:    QRS Duration: 99 QT Interval:  466 QTC Calculation: 458 R Axis:   68 Text Interpretation:  Sinus rhythm no STEMI, no change from old Confirmed by Charlesetta Shanks [phone]) on 07/12/2018 12:31:05 PM Was at the nail salon getting her nails done.  She had her fingers in a very strong solution.  After sitting there for about 10 minutes with her nail soaking sugar started to feel nauseated and lightheaded.  She broke out in a sweat and felt that she was close to passing out but did not actually pass out.  No associated headache no associated chest pain.  Symptoms resolved completely  Patient is alert and well in appearance.  Heart is normal with 7-5\4 systolic ejection murmur (known mitral valve prolapse).  Good distal pulses.  Lungs are clear.  Patient is alert and appropriate with all coordinated movements.  Diagnostic evaluation without acute findings.  I agree with plan of management.   Charlesetta Shanks, MD 07/12/18 1420

## 2018-07-12 NOTE — ED Notes (Signed)
Pt ambulated in room with this RN. Pt denies any dizziness, pt ambulated independently.

## 2018-07-12 NOTE — ED Provider Notes (Signed)
[facility] EMERGENCY DEPARTMENT Provider Note   CSN: [id number] Arrival date & time: 07/12/18  1113     History   Chief Complaint Chief Complaint  Patient presents with  . Near Syncope    HPI Anne Hogan is a 72 y.o. female with history of GERD, mitral valve prolapse, migraines presents for evaluation of acute onset, progressively improving near syncope.  She reports that approximately an hour prior to arrival she was at the nail salon and had her fingers in a bowl of warm fluid with "strong fumes "when she began to feel diaphoretic, lightheaded, nauseated.  She did note that she attempted to eat crackers and then vomited them back up.  She states she was flushed and very diaphoretic but denies any chest pain, shortness of breath, or abdominal pain.  No numbness, tingling, slurred speech, or facial droop.  Symptoms have progressively improved and she notes that she feels "almost back to normal, just a little weak all over".  Had a bowel movement in the ED with further improvement.  She states that she recently had Holter monitoring for the last 2 weeks and showed me a voicemail from the cardiologist office which stated that she had a few runs of SVT and occasional PVCs but did not have symptoms during those moments.  However, she states "I do not think that I did it right, because I did not always have a piece of paper with me to document ".  She states she has not had anything to eat today.  The history is provided by the patient.    Past Medical History:  Diagnosis Date  . ADD (attention deficit disorder)   . Arthritis    "right knee" (04/19/2013)  . Claustrophobia   . Depression   . Fracture of right tibial plateau 05/2016  . GERD (gastroesophageal reflux disease)   . Heart murmur   . Migraine    "bad when I was in 5th or 6th grade; I can usually ward them off by resting my head now" (04/19/2013)  . MVP (mitral valve prolapse)   . Sleep apnea    has not used  mouth guard in 5 years no cpap used   . Squamous carcinoma ?1999   "upper left groin" (04/19/2013)    Patient Active Problem List   Diagnosis Date Noted  . OA (osteoarthritis) of knee 09/21/2016  . Mitral valve prolapse 08/25/2016  . Mitral regurgitation 08/25/2016    Past Surgical History:  Procedure Laterality Date  . CHOLECYSTECTOMY  04/18/2013  . CHOLECYSTECTOMY N/A 04/18/2013   Procedure: LAPAROSCOPIC CHOLECYSTECTOMY WITH INTRAOPERATIVE CHOLANGIOGRAM;  Surgeon: Ralene Ok, MD;  Location: [facility];  Service: General;  Laterality: N/A;  . COLONOSCOPY W/ POLYPECTOMY    . EYE SURGERY Bilateral FALL  2016   IOC FOR CATARACTS  . KNEE ARTHROSCOPY Right 12/2007   "meniscus repair" (04/19/2013)  . RETINAL TEAR REPAIR CRYOTHERAPY Left ~ 1997  . TONSILLECTOMY  ~ 1952  . TOTAL KNEE ARTHROPLASTY Right 09/21/2016   Procedure: RIGHT TOTAL KNEE ARTHROPLASTY;  Surgeon: Gaynelle Arabian, MD;  Location: [facility];  Service: Orthopedics;  Laterality: Right;  requests 10mins with abductor block  . VARICOSE VEIN SURGERY Right 2000's  . WRIST SURGERY Right 1975   "had a knot taken off" (04/19/2013)     OB History   No obstetric history on file.      Home Medications    Prior to Admission medications   Medication Sig Start Date End  Date Taking? Authorizing Provider  acetaminophen (TYLENOL) 500 MG tablet Take 500-1,000 mg by mouth every 6 (six) hours as needed for moderate pain (depends on pain if takes 1-2 tablets).    [provider]  alum hydroxide-mag trisilicate (GAVISCON) 07-37 MG CHEW chewable tablet Chew 2 tablets by mouth at bedtime as needed for indigestion or heartburn.    [provider]  aspirin 81 MG tablet Take 81 mg by mouth daily.    [provider]  buPROPion (WELLBUTRIN XL) 300 MG 24 hr tablet Take 300 mg by mouth daily.    [provider]  Kendall Flack 575 MG/5ML SYRP Take as directed    [provider]  polyethylene glycol (MIRALAX /  GLYCOLAX) packet Take 8.5 g by mouth daily.    [provider]  sodium chloride (OCEAN) 0.65 % SOLN nasal spray Place 1 spray into both nostrils as needed for congestion.    [provider]    Family History Family History  Problem Relation Age of Onset  . Tremor Mother   . Osteoporosis Mother   . Sarcoidosis Mother   . Hypertension Mother   . Congestive Heart Failure Father   . Heart attack Father   . Diabetes Mellitus II Father   . Diabetes Mellitus II Brother     Social History Social History   Tobacco Use  . Smoking status: Former Smoker    Packs/day: 1.50    Years: 20.00    Pack years: 30.00    Types: Cigarettes    Last attempt to quit: 11/30/1986    Years since quitting: 31.6  . Smokeless tobacco: Never Used  Substance Use Topics  . Alcohol use: Yes    Comment: RARE  . Drug use: No     Allergies   Penicillins; Codeine; Milk-related compounds; and Morphine and related   Review of Systems Review of Systems  Constitutional: Positive for diaphoresis and fatigue.  Respiratory: Negative for shortness of breath.   Cardiovascular: Negative for chest pain.  Gastrointestinal: Positive for nausea and vomiting. Negative for abdominal pain.  Neurological: Positive for light-headedness. Negative for syncope, numbness and headaches.  All other systems reviewed and are negative.    Physical Exam Updated Vital Signs BP 126/70   Pulse 67   Temp (!) 97.4 F (36.3 C) (Oral)   Resp 16   Ht 5\' 2"  (1.575 m)   Wt 73.5 kg   SpO2 100%   BMI 29.63 kg/m   Physical Exam Vitals signs and nursing note reviewed.  Constitutional:      General: She is not in acute distress.    Appearance: She is well-developed.  HENT:     Head: Normocephalic and atraumatic.  Eyes:     General:        Right eye: No discharge.        Left eye: No discharge.     Conjunctiva/sclera: Conjunctivae normal.  Neck:     Vascular: No JVD.     Trachea: No tracheal deviation.    Cardiovascular:     Rate and Rhythm: Normal rate.     Pulses: Normal pulses.     Heart sounds: Murmur present.     Comments: 2+ radial and DP/PT pulses bilaterally, Homans sign absent bilaterally, no lower extremity edema, no palpable cords, compartments are soft  Pulmonary:     Effort: Pulmonary effort is normal.     Breath sounds: Normal breath sounds.  Abdominal:     General: Bowel sounds are  normal. There is no distension.     Tenderness: There is no abdominal tenderness. There is no guarding.  Musculoskeletal:        General: No tenderness.  Skin:    General: Skin is warm and dry.     Capillary Refill: Capillary refill takes less than 2 seconds.     Findings: No erythema.  Neurological:     General: No focal deficit present.     Mental Status: She is alert and oriented to person, place, and time.     Cranial Nerves: No cranial nerve deficit.     Sensory: No sensory deficit.     Motor: No weakness.     Coordination: Coordination normal.     Gait: Gait normal.  Psychiatric:        Behavior: Behavior normal.      ED Treatments / Results  Labs (all labs ordered are listed, but only abnormal results are displayed) Labs Reviewed  BASIC METABOLIC PANEL - Abnormal; Notable for the following components:      Result Value   CO2 21 (*)    Glucose, Bld 108 (*)    Calcium 8.5 (*)    All other components within normal limits  CBC WITH DIFFERENTIAL/PLATELET  I-STAT TROPONIN, ED    EKG EKG Interpretation  Date/Time:  Tuesday July 12 2018 11:19:19 EST Ventricular Rate:  58 PR Interval:    QRS Duration: 99 QT Interval:  466 QTC Calculation: 458 R Axis:   68 Text Interpretation:  Sinus rhythm no STEMI, no change from old Confirmed by Charlesetta Shanks [phone]) on 07/12/2018 12:31:05 PM   Radiology No results found.  Procedures Procedures (including critical care time)  Medications Ordered in ED Medications - No data to display   Initial Impression /  Assessment and Plan / ED Course  I have reviewed the triage vital signs and the nursing notes.  Pertinent labs & imaging results that were available during my care of the patient were reviewed by me and considered in my medical decision making (see chart for details).    Patient presenting for evaluation of near syncope while exposed to some fumes at a nail salon.  She is afebrile, vital signs are stable.  She is nontoxic in appearance.  No focal neurologic deficits on examination.  She does report that she is much improved from her symptom onset at the nail salon.  She is not hypoglycemic, EKG shows no acute changes, and troponin is negative.  She had no complaints of chest pain and I doubt ACS/MI.  Low suspicion of PE, symptoms appear more consistent with vasovagal reaction.  Lab work reviewed by me shows no leukocytosis, no anemia, no metabolic derangements.  On reevaluation she is resting comfortably, tolerating p.o. food and fluids without difficulty.  She is ambulatory without difficulty.  She did have Holter monitoring with her cardiologist recently which was reassuring as well.  She will follow-up with her cardiologist or her PCP for reevaluation. Discussed strict ED return precautions. Pt verbalized understanding of and agreement with plan and is safe for discharge home at this time.  Patient seen and evaluate Dr. Johnney Killian who agrees with assessment and plan at this time.  Final Clinical Impressions(s) / ED Diagnoses   Final diagnoses:  Near syncope    ED Discharge Orders    None       Debroah Baller 07/12/18 1427    Charlesetta Shanks, MD 07/22/18 1539

## 2018-07-12 NOTE — ED Notes (Signed)
Patient verbalizes understanding of discharge instructions. Opportunity for questioning and answers were provided. Armband removed by staff, pt discharged from ED. Pt wheeled to lobby and taken home by friend.

## 2018-07-15 DIAGNOSIS — M1712 Unilateral primary osteoarthritis, left knee: Secondary | ICD-10-CM | POA: Diagnosis not present

## 2018-08-18 DIAGNOSIS — Z23 Encounter for immunization: Secondary | ICD-10-CM | POA: Diagnosis not present

## 2018-08-18 DIAGNOSIS — F909 Attention-deficit hyperactivity disorder, unspecified type: Secondary | ICD-10-CM | POA: Diagnosis not present

## 2018-08-18 DIAGNOSIS — Z6829 Body mass index (BMI) 29.0-29.9, adult: Secondary | ICD-10-CM | POA: Diagnosis not present

## 2018-08-18 DIAGNOSIS — E7849 Other hyperlipidemia: Secondary | ICD-10-CM | POA: Diagnosis not present

## 2018-08-18 DIAGNOSIS — M859 Disorder of bone density and structure, unspecified: Secondary | ICD-10-CM | POA: Diagnosis not present

## 2018-08-18 DIAGNOSIS — Z1389 Encounter for screening for other disorder: Secondary | ICD-10-CM | POA: Diagnosis not present

## 2018-08-18 DIAGNOSIS — F39 Unspecified mood [affective] disorder: Secondary | ICD-10-CM | POA: Diagnosis not present

## 2018-08-18 DIAGNOSIS — R7301 Impaired fasting glucose: Secondary | ICD-10-CM | POA: Diagnosis not present

## 2018-08-18 DIAGNOSIS — R002 Palpitations: Secondary | ICD-10-CM | POA: Diagnosis not present

## 2018-08-29 ENCOUNTER — Ambulatory Visit (HOSPITAL_COMMUNITY): Payer: PPO | Attending: Cardiology

## 2018-08-29 DIAGNOSIS — I341 Nonrheumatic mitral (valve) prolapse: Secondary | ICD-10-CM | POA: Insufficient documentation

## 2018-09-09 ENCOUNTER — Telehealth: Payer: Self-pay | Admitting: Cardiology

## 2018-09-09 NOTE — Telephone Encounter (Signed)
New message   Patient is returning call for Echo results.

## 2018-09-09 NOTE — Telephone Encounter (Signed)
Results reviewed w/ pt. Clarified what she was reading via MyChart. Scheduled her for OV on 3/24. Patient verbalized understanding and agreeable to plan.

## 2018-09-30 ENCOUNTER — Encounter: Payer: Self-pay | Admitting: Cardiology

## 2018-10-11 ENCOUNTER — Telehealth (INDEPENDENT_AMBULATORY_CARE_PROVIDER_SITE_OTHER): Payer: PPO | Admitting: Cardiology

## 2018-10-11 ENCOUNTER — Other Ambulatory Visit: Payer: Self-pay

## 2018-10-11 DIAGNOSIS — I340 Nonrheumatic mitral (valve) insufficiency: Secondary | ICD-10-CM | POA: Diagnosis not present

## 2018-10-11 NOTE — Progress Notes (Signed)
Electrophysiology TeleHealth Note   Due to national recommendations of social distancing due to COVID 19, an audio/video telehealth visit is felt to be most appropriate for this patient at this time.  See MyChart message from today for the patient's consent to telehealth for [name].   Date:  10/11/2018   ID:  CONSTANZA Hogan, DOB 16-Oct-1945, MRN [id number]  Location: patient's home  Provider location: [address] [place]  Evaluation Performed: Follow-up visit  PCP:  [name], MD  Cardiologist:  Winner Valeriano Meredith Leeds, MD  Electrophysiologist:  None   Chief Complaint:  palpitations  History of Present Illness:    Anne Hogan is a 73 y.o. female who presents via audio/video conferencing for a telehealth visit today.  Since last being seen in our clinic, the patient reports doing very well.  Today, she denies symptoms of palpitations, chest pain, shortness of breath,  lower extremity edema, dizziness, presyncope, or syncope.  The patient is otherwise without complaint today.  The patient denies symptoms of fevers, chills, cough, or new SOB worrisome for COVID 19.   She is currently feeling well without chest pain or shortness of breath.  She was having palpitations in February, but this was around the time that she was considering a move to a townhouse.  She decided not to move and has had minimal palpitations since that time.  Past Medical History:  Diagnosis Date  . ADD (attention deficit disorder)   . Arthritis    "right knee" (04/19/2013)  . Claustrophobia   . Depression   . Fracture of right tibial plateau 05/2016  . GERD (gastroesophageal reflux disease)   . Heart murmur   . Migraine    "bad when I was in 5th or 6th grade; I can usually ward them off by resting my head now" (04/19/2013)  . MVP (mitral valve prolapse)   . Sleep apnea    has not used mouth guard in 5 years no cpap used   . Squamous carcinoma ?1999   "upper left groin"  (04/19/2013)    Past Surgical History:  Procedure Laterality Date  . CHOLECYSTECTOMY  04/18/2013  . CHOLECYSTECTOMY N/A 04/18/2013   Procedure: LAPAROSCOPIC CHOLECYSTECTOMY WITH INTRAOPERATIVE CHOLANGIOGRAM;  Surgeon: Ralene Ok, MD;  Location: [facility];  Service: General;  Laterality: N/A;  . COLONOSCOPY W/ POLYPECTOMY    . EYE SURGERY Bilateral FALL  2016   IOC FOR CATARACTS  . KNEE ARTHROSCOPY Right 12/2007   "meniscus repair" (04/19/2013)  . RETINAL TEAR REPAIR CRYOTHERAPY Left ~ 1997  . TONSILLECTOMY  ~ 1952  . TOTAL KNEE ARTHROPLASTY Right 09/21/2016   Procedure: RIGHT TOTAL KNEE ARTHROPLASTY;  Surgeon: Gaynelle Arabian, MD;  Location: [facility];  Service: Orthopedics;  Laterality: Right;  requests 36mins with abductor block  . VARICOSE VEIN SURGERY Right 2000's  . WRIST SURGERY Right 1975   "had a knot taken off" (04/19/2013)    Current Outpatient Medications  Medication Sig Dispense Refill  . acetaminophen (TYLENOL) 500 MG tablet Take 500-1,000 mg by mouth every 6 (six) hours as needed for moderate pain (depends on pain if takes 1-2 tablets).    [name] alum hydroxide-mag trisilicate (GAVISCON) 46-96 MG CHEW chewable tablet Chew 2 tablets by mouth at bedtime as needed for indigestion or heartburn.    [name] aspirin 81 MG tablet Take 81 mg by mouth daily.    [name] buPROPion (WELLBUTRIN XL) 300 MG 24 hr tablet Take 300 mg by mouth daily.    [name]  Elderberry 575 MG/5ML SYRP Take as directed    . polyethylene glycol (MIRALAX / GLYCOLAX) packet Take 8.5 g by mouth daily.    . sodium chloride (OCEAN) 0.65 % SOLN nasal spray Place 1 spray into both nostrils as needed for congestion.     No current facility-administered medications for this visit.     Allergies:   Penicillins; Codeine; Milk-related compounds; and Morphine and related   Social History:  The patient  reports that she quit smoking about 31 years ago. Her smoking use included cigarettes. She has a 30.00 pack-year smoking history. She has never  used smokeless tobacco. She reports current alcohol use. She reports that she does not use drugs.   Family History:  The patient's  family history includes Congestive Heart Failure in her father; Diabetes Mellitus II in her brother and father; Heart attack in her father; Hypertension in her mother; Osteoporosis in her mother; Sarcoidosis in her mother; Tremor in her mother.   ROS:  Please see the history of present illness.   All other systems are personally reviewed and negative.    Exam:    Vital Signs:  BP 117/73   Pulse 77   Wt 162 lb 11.2 oz (73.8 kg)   BMI 29.76 kg/m   Over the phone, well-appearing in no distress  Well appearing, alert and conversant, regular work of breathing,  good skin color Eyes- anicteric, neuro- grossly intact, skin- no apparent rash or lesions or cyanosis, mouth- oral mucosa is pink   Labs/Other Tests and Data Reviewed:    Recent Labs: 07/12/2018: BUN 13; Creatinine, Ser 0.92; Hemoglobin 12.3; Platelets 274; Potassium 3.8; Sodium 140   Wt Readings from Last 3 Encounters:  10/11/18 162 lb 11.2 oz (73.8 kg)  07/12/18 162 lb (73.5 kg)  08/25/17 164 lb (74.4 kg)     Other studies personally reviewed: Additional studies/ records that were reviewed today include: ECG - 07/15/18 personally reviewed shows sinus rhythm  Review of the above records today demonstrates:  Prior radiographs: TTE 08/29/18  1. The left ventricle has hyperdynamic systolic function of >83%. The cavity size was normal. There is no increased left ventricular wall thickness. Echo evidence of pseudonormalization in diastolic relaxation Elevated mean left atrial pressure.  2. Left atrial size was severely dilated.  3. Severe mitral valve prolapse, posterior> anterior leaflet.  4. The mitral valve is myxomatous. There is moderate thickening of the anterior and posterior leaflets.  5. Mitral valve regurgitation is severe. The MR jet is eccentric anteriorly and medially directed. The  estimated effective regurgitant orifice area is 0.7 cm sq. by the PISA method. The regurgitant volume is 100 mL, regurgitant fraction is 60%. There is  flow reversal in the pulmonary veins.  6. Right ventricular systolic pressure is mildly elevated with an estimated pressure of 37 mmHg.  7. Aortic valve regurgitation is mild.   ASSESSMENT & PLAN:    1.  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  I Albie Arizpe discuss this with my echo colleagues to see if there is anything further we need to do such as TEE.  Otherwise she is minimally symptomatic.  No changes.  2. Palpitations: Greatly improved since November.  Her last episode of palpitations was likely due to anxiety.  COVID 19 screen The patient denies symptoms of COVID 19 at this time.  The importance of social distancing was discussed today.  Follow-up: 6 months  Current medicines are reviewed at length with  the patient today.   The patient does not have concerns regarding her medicines.  The following changes were made today:  none  Labs/ tests ordered today include:  No orders of the defined types were placed in this encounter.    Patient Risk:  after full review of this patients clinical status, I feel that they are at moderate risk at this time.  Today, I have spent 22 minutes with the patient with telehealth technology discussing mitral regurgitation, palpitations.    Signed, Royann Wildasin Meredith Leeds, MD  10/11/2018 11:53 AM     [facility] [address] [place] [address] [phone] (office) [phone] (fax)

## 2018-11-04 ENCOUNTER — Telehealth: Payer: Self-pay | Admitting: Cardiology

## 2018-11-04 NOTE — Telephone Encounter (Signed)
Advised pt to try Tylenol and to continue monitoring her BP and HR.   Advised pt to call back with increasing pain or any new symptoms. Pt verbalized understanding.

## 2018-11-04 NOTE — Telephone Encounter (Signed)
Pt reports feeling "left breast cramp" after waking up this morning. It has not decreased throughout the day. Pt denies pain radiating anywhere.   Denies SOB, N/V, dizzyness or diaphoresis.   BP this morning was 140/83 and HR 100  Reports "everything feels perfectly normal" other than not being able to get deep breaths.   Pt thinks she may have pulled a muscle while sleeping. Will route to DOD RN for advisement

## 2018-11-04 NOTE — Telephone Encounter (Signed)
Pt c/o of Chest Pain: STAT if CP now or developed within 24 hours  1. Are you having CP right now? Es- been having pain all day- She is not sure , it might be a pulled muscle  2. Are you experiencing any other symptoms (ex. SOB, nausea, vomiting, sweating)?no  3. How long have you been experiencing CP? It started around 7:00 this morning  4. Is your CP continuous or coming and going? constant  5. Have you taken Nitroglycerin?  No, she took aspirin ?

## 2018-11-18 DIAGNOSIS — M940 Chondrocostal junction syndrome [Tietze]: Secondary | ICD-10-CM | POA: Diagnosis not present

## 2019-02-27 ENCOUNTER — Encounter: Payer: Self-pay | Admitting: Pulmonary Disease

## 2019-02-27 DIAGNOSIS — J069 Acute upper respiratory infection, unspecified: Secondary | ICD-10-CM | POA: Diagnosis not present

## 2019-02-27 DIAGNOSIS — R05 Cough: Secondary | ICD-10-CM | POA: Diagnosis not present

## 2019-02-27 DIAGNOSIS — Z20818 Contact with and (suspected) exposure to other bacterial communicable diseases: Secondary | ICD-10-CM | POA: Diagnosis not present

## 2019-03-01 DIAGNOSIS — L989 Disorder of the skin and subcutaneous tissue, unspecified: Secondary | ICD-10-CM | POA: Diagnosis not present

## 2019-03-24 DIAGNOSIS — R58 Hemorrhage, not elsewhere classified: Secondary | ICD-10-CM | POA: Diagnosis not present

## 2019-03-24 DIAGNOSIS — J45909 Unspecified asthma, uncomplicated: Secondary | ICD-10-CM | POA: Diagnosis not present

## 2019-03-24 DIAGNOSIS — J302 Other seasonal allergic rhinitis: Secondary | ICD-10-CM | POA: Diagnosis not present

## 2019-04-03 ENCOUNTER — Ambulatory Visit (INDEPENDENT_AMBULATORY_CARE_PROVIDER_SITE_OTHER): Payer: PPO | Admitting: Pulmonary Disease

## 2019-04-03 ENCOUNTER — Other Ambulatory Visit: Payer: Self-pay

## 2019-04-03 ENCOUNTER — Encounter: Payer: Self-pay | Admitting: Pulmonary Disease

## 2019-04-03 VITALS — BP 120/74 | HR 76 | Temp 97.0°F | Ht 63.0 in | Wt 169.2 lb

## 2019-04-03 DIAGNOSIS — J45909 Unspecified asthma, uncomplicated: Secondary | ICD-10-CM

## 2019-04-03 DIAGNOSIS — R0600 Dyspnea, unspecified: Secondary | ICD-10-CM

## 2019-04-03 DIAGNOSIS — Z23 Encounter for immunization: Secondary | ICD-10-CM

## 2019-04-03 HISTORY — DX: Unspecified asthma, uncomplicated: J45.909

## 2019-04-03 LAB — CBC WITH DIFFERENTIAL/PLATELET
Basophils Absolute: 0.1 10*3/uL (ref 0.0–0.1)
Basophils Relative: 2 % (ref 0.0–3.0)
Eosinophils Absolute: 0.4 10*3/uL (ref 0.0–0.7)
Eosinophils Relative: 7.5 % — ABNORMAL HIGH (ref 0.0–5.0)
HCT: 41.1 % (ref 36.0–46.0)
Hemoglobin: 13.7 g/dL (ref 12.0–15.0)
Lymphocytes Relative: 19.5 % (ref 12.0–46.0)
Lymphs Abs: 1 10*3/uL (ref 0.7–4.0)
MCHC: 33.4 g/dL (ref 30.0–36.0)
MCV: 88.9 fl (ref 78.0–100.0)
Monocytes Absolute: 0.6 10*3/uL (ref 0.1–1.0)
Monocytes Relative: 10.6 % (ref 3.0–12.0)
Neutro Abs: 3.2 10*3/uL (ref 1.4–7.7)
Neutrophils Relative %: 60.4 % (ref 43.0–77.0)
Platelets: 324 10*3/uL (ref 150.0–400.0)
RBC: 4.63 Mil/uL (ref 3.87–5.11)
RDW: 13.4 % (ref 11.5–15.5)
WBC: 5.3 10*3/uL (ref 4.0–10.5)

## 2019-04-03 NOTE — Progress Notes (Addendum)
Anne Hogan    [id number]    1946-04-10  Primary Care Physician:Avva, Steva Ready, MD  Referring Physician: [name], [address] [place],  [place] [address]  Chief complaint: Consult for dyspnea  HPI: Anne Hogan presents for evaluation of dyspnea.  His symptoms started in October 2019 when she was exposed to dust while cleaning out the garage.  She continued to have intermittent symptoms of cough, shortness of breath, wheezing with productive yellow cough.  Over the past 2 months she had been given 2 courses of prednisone which gave her great relief.  The last dose of prednisone was 10 days ago She has also been recently started on Breo and Singulair which also has improved symptoms Chest x-ray at her primary care in mid August was reportedly normal.  She has history of allergic rhinitis, nonrheumatic mitral valve disease for which she follows with cardiology.  She has history of sleep apnea in the past but was intolerant of CPAP.  She had used dental devices for a few years but now is not on treatment.  Feels that she does not have any ongoing symptoms of sleep apnea.  She has occasional symptoms of GERD for which she uses over-the-counter medication  Pets: Has a have any's dog.  No cats, birds, farm animals Occupation: Retired Research scientist (life sciences) Exposures: No ongoing exposures.  No mold, hot tub, Jacuzzi Smoking history: 30-pack-year smoker.  Quit in 1987 Travel history: No significant travel history Relevant family history: Grandfather had emphysema.  He was a smoker.   Outpatient Encounter Medications as of 04/03/2019  Medication Sig  . acetaminophen (TYLENOL) 500 MG tablet Take 500-1,000 mg by mouth every 6 (six) hours as needed for moderate pain (depends on pain if takes 1-2 tablets).  [name] albuterol (PROVENTIL) (2.5 MG/3ML) 0.083% nebulizer solution Take 2.5 mg by nebulization every 4 (four) hours as needed.   [name] alum hydroxide-mag trisilicate (GAVISCON) 62-37  MG CHEW chewable tablet Chew 2 tablets by mouth at bedtime as needed for indigestion or heartburn.  [name] aspirin 81 MG tablet Take 81 mg by mouth daily.  [name] BREO ELLIPTA 100-25 MCG/INH AEPB Inhale 1 puff into the lungs daily.   [name] buPROPion (WELLBUTRIN XL) 300 MG 24 hr tablet Take 300 mg by mouth daily.  Kendall Flack 575 MG/5ML SYRP Take as directed  . montelukast (SINGULAIR) 10 MG tablet Take 10 mg by mouth at bedtime.   . polyethylene glycol (MIRALAX / GLYCOLAX) packet Take 8.5 g by mouth daily.  . sodium chloride (OCEAN) 0.65 % SOLN nasal spray Place 1 spray into both nostrils as needed for congestion.   No facility-administered encounter medications on file as of 04/03/2019.     Allergies as of 04/03/2019 - Review Complete 04/03/2019  Allergen Reaction Noted  . Penicillins Rash 03/19/2011  . Codeine Other (See Comments) 03/19/2011  . Milk-related compounds Nausea And Vomiting and Other (See Comments) 06/17/2016  . Morphine and related  09/16/2016    Past Medical History:  Diagnosis Date  . ADD (attention deficit disorder)   . Arthritis    "right knee" (04/19/2013)  . Claustrophobia   . Depression   . Fracture of right tibial plateau 05/2016  . GERD (gastroesophageal reflux disease)   . Heart murmur   . Migraine    "bad when I was in 5th or 6th grade; I can usually ward them off by resting my head now" (04/19/2013)  . MVP (mitral valve prolapse)   .  Sleep apnea    has not used mouth guard in 5 years no cpap used   . Squamous carcinoma ?1999   "upper left groin" (04/19/2013)    Past Surgical History:  Procedure Laterality Date  . CHOLECYSTECTOMY  04/18/2013  . CHOLECYSTECTOMY N/A 04/18/2013   Procedure: LAPAROSCOPIC CHOLECYSTECTOMY WITH INTRAOPERATIVE CHOLANGIOGRAM;  Surgeon: Ralene Ok, MD;  Location: [facility];  Service: General;  Laterality: N/A;  . COLONOSCOPY W/ POLYPECTOMY    . EYE SURGERY Bilateral FALL  2016   IOC FOR CATARACTS  . KNEE ARTHROSCOPY Right 12/2007    "meniscus repair" (04/19/2013)  . RETINAL TEAR REPAIR CRYOTHERAPY Left ~ 1997  . TONSILLECTOMY  ~ 1952  . TOTAL KNEE ARTHROPLASTY Right 09/21/2016   Procedure: RIGHT TOTAL KNEE ARTHROPLASTY;  Surgeon: Gaynelle Arabian, MD;  Location: [facility];  Service: Orthopedics;  Laterality: Right;  requests 49mins with abductor block  . VARICOSE VEIN SURGERY Right 2000's  . WRIST SURGERY Right 1975   "had a knot taken off" (04/19/2013)    Family History  Problem Relation Age of Onset  . Tremor Mother   . Osteoporosis Mother   . Sarcoidosis Mother   . Hypertension Mother   . Congestive Heart Failure Father   . Heart attack Father   . Diabetes Mellitus II Father   . Diabetes Mellitus II Brother     Social History   Socioeconomic History  . Marital status: Widowed    Spouse name: Not on file  . Number of children: Not on file  . Years of education: Not on file  . Highest education level: Not on file  Occupational History  . Not on file  Social Needs  . Financial resource strain: Not on file  . Food insecurity    Worry: Not on file    Inability: Not on file  . Transportation needs    Medical: Not on file    Non-medical: Not on file  Tobacco Use  . Smoking status: Former Smoker    Packs/day: 1.50    Years: 20.00    Pack years: 30.00    Types: Cigarettes    Quit date: 11/30/1986    Years since quitting: 32.3  . Smokeless tobacco: Never Used  Substance and Sexual Activity  . Alcohol use: Yes    Comment: RARE  . Drug use: No  . Sexual activity: Never  Lifestyle  . Physical activity    Days per week: Not on file    Minutes per session: Not on file  . Stress: Not on file  Relationships  . Social Herbalist on phone: Not on file    Gets together: Not on file    Attends religious service: Not on file    Active member of club or organization: Not on file    Attends meetings of clubs or organizations: Not on file    Relationship status: Not on file  . Intimate partner  violence    Fear of current or ex partner: Not on file    Emotionally abused: Not on file    Physically abused: Not on file    Forced sexual activity: Not on file  Other Topics Concern  . Not on file  Social History Narrative  . Not on file    Review of systems: Review of Systems  Constitutional: Negative for fever and chills.  HENT: Negative.   Eyes: Negative for blurred vision.  Respiratory: as per HPI  Cardiovascular: Negative for chest pain and  palpitations.  Gastrointestinal: Negative for vomiting, diarrhea, blood per rectum. Genitourinary: Negative for dysuria, urgency, frequency and hematuria.  Musculoskeletal: Negative for myalgias, back pain and joint pain.  Skin: Negative for itching and rash.  Neurological: Negative for dizziness, tremors, focal weakness, seizures and loss of consciousness.  Endo/Heme/Allergies: Negative for environmental allergies.  Psychiatric/Behavioral: Negative for depression, suicidal ideas and hallucinations.  All other systems reviewed and are negative.  Physical Exam: Blood pressure 120/74, pulse 76, temperature (!) 97 F (36.1 C), temperature source Temporal, height 5\' 3"  (1.6 m), weight 169 lb 3.2 oz (76.7 kg), SpO2 96 %. Gen:      No acute distress HEENT:  EOMI, sclera anicteric Neck:     No masses; no thyromegaly Lungs:    Clear to auscultation bilaterally; normal respiratory effort CV:         Regular rate and rhythm; no murmurs Abd:      + bowel sounds; soft, non-tender; no palpable masses, no distension Ext:    No edema; adequate peripheral perfusion Skin:      Warm and dry; no rash Neuro: alert and oriented x 3 Psych: normal mood and affect  Data Reviewed: Imaging: Chest x-ray 03/19/11- no active disease CT abdomen pelvis 04/04/2013-visualized lung bases are clear I have reviewed the images personally.  Labs: CBC 07/12/2018-WBC 5.9, eos 2%, absolute eosinophil count 118  Assessment:  Consult for dyspnea Likely has asthma  given her presentation and response to prednisone, Singulair and Breo Symptoms are under good control at present We will check CBC differential, respiratory allergy profile as she wants to know if she is allergic to her dog Schedule pulmonary function test   Obtain report of recent chest x-ray from primary care  Plan/Recommendations: - Continue Breo, Singulair - CBC, respiratory allergy profile - Obtain x-ray report from family - PFTs  Marshell Garfinkel MD [facility] 04/03/2019, 9:22 AM  CC: [name], MD  Addendum: Chest x-ray report from primary care dated February 27, 2019 Hyperexpansion of lungs.  No acute cardiopulmonary disease.  Mild thoracic dextroscoliosis and spondylosis.  Marshell Garfinkel MD [facility] 04/08/2019, 10:54 AM

## 2019-04-03 NOTE — Patient Instructions (Signed)
We will check some labs today including CBC differential, respiratory allergy profile We will schedule you for pulmonary function test Continue with the Breo, Singulair Follow-up in 2 to 3 months.

## 2019-04-04 LAB — RESPIRATORY ALLERGY PROFILE REGION II ~~LOC~~

## 2019-04-04 LAB — INTERPRETATION:

## 2019-04-07 DIAGNOSIS — H00015 Hordeolum externum left lower eyelid: Secondary | ICD-10-CM | POA: Diagnosis not present

## 2019-04-10 ENCOUNTER — Telehealth: Payer: Self-pay

## 2019-04-10 NOTE — Telephone Encounter (Signed)
  Advised pt of results. Pt understood and nothing further is needed.    Notes recorded by Marshell Garfinkel, MD on 04/08/2019 at 10:35 AM EDT  Labs show elevation in a cell type called esoinophils. This is consistent with asthma  Continue treatment as discussed at last visit.  Interpretation:

## 2019-04-19 DIAGNOSIS — M859 Disorder of bone density and structure, unspecified: Secondary | ICD-10-CM | POA: Diagnosis not present

## 2019-04-19 DIAGNOSIS — E7849 Other hyperlipidemia: Secondary | ICD-10-CM | POA: Diagnosis not present

## 2019-04-19 DIAGNOSIS — R7301 Impaired fasting glucose: Secondary | ICD-10-CM | POA: Diagnosis not present

## 2019-04-24 ENCOUNTER — Other Ambulatory Visit: Payer: Self-pay | Admitting: Pulmonary Disease

## 2019-04-24 DIAGNOSIS — R0600 Dyspnea, unspecified: Secondary | ICD-10-CM

## 2019-04-25 ENCOUNTER — Telehealth: Payer: Self-pay | Admitting: Pulmonary Disease

## 2019-04-25 DIAGNOSIS — R0600 Dyspnea, unspecified: Secondary | ICD-10-CM

## 2019-04-25 MED ORDER — BREO ELLIPTA 100-25 MCG/INH IN AEPB: INHALATION_SPRAY | RESPIRATORY_TRACT | 3 refills | Status: DC

## 2019-04-25 NOTE — Telephone Encounter (Signed)
Spoke with the pt  Rx refill for [facility] sent  Nothing further needed

## 2019-04-26 DIAGNOSIS — R82998 Other abnormal findings in urine: Secondary | ICD-10-CM | POA: Diagnosis not present

## 2019-04-27 DIAGNOSIS — R002 Palpitations: Secondary | ICD-10-CM | POA: Diagnosis not present

## 2019-04-27 DIAGNOSIS — F909 Attention-deficit hyperactivity disorder, unspecified type: Secondary | ICD-10-CM | POA: Diagnosis not present

## 2019-04-27 DIAGNOSIS — J45909 Unspecified asthma, uncomplicated: Secondary | ICD-10-CM | POA: Diagnosis not present

## 2019-04-27 DIAGNOSIS — F39 Unspecified mood [affective] disorder: Secondary | ICD-10-CM | POA: Diagnosis not present

## 2019-04-27 DIAGNOSIS — N1831 Chronic kidney disease, stage 3a: Secondary | ICD-10-CM | POA: Diagnosis not present

## 2019-04-27 DIAGNOSIS — J302 Other seasonal allergic rhinitis: Secondary | ICD-10-CM | POA: Diagnosis not present

## 2019-04-27 DIAGNOSIS — Z0000 Encounter for general adult medical examination without abnormal findings: Secondary | ICD-10-CM | POA: Diagnosis not present

## 2019-04-27 DIAGNOSIS — K219 Gastro-esophageal reflux disease without esophagitis: Secondary | ICD-10-CM | POA: Diagnosis not present

## 2019-04-27 DIAGNOSIS — R7301 Impaired fasting glucose: Secondary | ICD-10-CM | POA: Diagnosis not present

## 2019-04-27 DIAGNOSIS — I340 Nonrheumatic mitral (valve) insufficiency: Secondary | ICD-10-CM | POA: Diagnosis not present

## 2019-04-27 DIAGNOSIS — M8580 Other specified disorders of bone density and structure, unspecified site: Secondary | ICD-10-CM | POA: Diagnosis not present

## 2019-04-27 DIAGNOSIS — E785 Hyperlipidemia, unspecified: Secondary | ICD-10-CM | POA: Diagnosis not present

## 2019-05-03 ENCOUNTER — Telehealth: Payer: Self-pay | Admitting: Cardiology

## 2019-05-03 DIAGNOSIS — E7849 Other hyperlipidemia: Secondary | ICD-10-CM | POA: Diagnosis not present

## 2019-05-03 NOTE — Telephone Encounter (Signed)
Called patient back about her message. Patient complaining of having lightheadedness in the evenings and early mornings. Patient also stated almost every night she has a heavy pounding/palpitations at night. Patient stated she feels fine right now. Offered patient a DOD appt tomorrow, patient declined. Encouraged patient to stop drinking caffeine, hydrate with water, and eat a well balance diet to see if this helps. Encouraged patient if symptoms come back to give our office a call.  Informed patient if she feels like she is going to pass out to call 911 or have someone take her to ED. Patient's BP is normal and HR 70's. Will forward to Dr. Curt Bears and his nurse for further advisement. Patient agreed to plan.

## 2019-05-03 NOTE — Telephone Encounter (Signed)
Patient c/o Palpitations:  High priority if patient c/o lightheadedness, shortness of breath, or chest pain  1) How long have you had palpitations/irregular HR/ Afib? Are you having the symptoms now? Often at night and early morning- pt said her doctor said she need to see Dr Curt Bears especially for the off balance and lightheaded  2) Are you currently experiencing lightheadedness, SOB or CP? Lightheaded, feel off balance- not at this time- but had quite a few episodes the last few days  3) Do you have a history of afib (atrial fibrillation) or irregular heart rhythm? no  4) Have you checked your BP or HR? (document readings if available): Standing today was 118/74, 104/70, sitting 149/133, 110/67  and laying down 112/63  5) Are you experiencing any other symptoms? no

## 2019-05-04 ENCOUNTER — Telehealth: Payer: Self-pay

## 2019-05-04 NOTE — Telephone Encounter (Signed)
I spoke to the patient who was scheduled with Jonni Sanger on 10/23.  She will continue to monitor her symptoms, HR and BP until then.  If things worsen, she will go to the ED.

## 2019-05-04 NOTE — Telephone Encounter (Signed)
If she wishes to be seen can set up with EP APP appointment.

## 2019-05-04 NOTE — Telephone Encounter (Signed)
Per pt feels much better since yesterday ,but after reviewing chart appears pt was suppose to be seen in Sept for 6 mo f/u Appt made with Oda Kilts PA for Friday 05-12-19 at 11:20 am.

## 2019-05-04 NOTE — Telephone Encounter (Signed)
-----   Message from Anson Crofts sent at 05/04/2019  1:16 PM EDT ----- Regarding: Appointment Patient left voicemail asking for a call back to schedule an appointment with Dr. Curt Bears. Stated she was feeling lightheaded and unsteady on her feet. Call back number is [phone].

## 2019-05-05 DIAGNOSIS — N1831 Chronic kidney disease, stage 3a: Secondary | ICD-10-CM | POA: Diagnosis not present

## 2019-05-12 ENCOUNTER — Ambulatory Visit (INDEPENDENT_AMBULATORY_CARE_PROVIDER_SITE_OTHER): Payer: PPO | Admitting: Student

## 2019-05-12 ENCOUNTER — Other Ambulatory Visit: Payer: Self-pay | Admitting: Obstetrics and Gynecology

## 2019-05-12 ENCOUNTER — Ambulatory Visit: Payer: PPO | Admitting: Student

## 2019-05-12 ENCOUNTER — Other Ambulatory Visit: Payer: Self-pay

## 2019-05-12 VITALS — BP 114/74 | HR 76 | Ht 62.5 in | Wt 171.0 lb

## 2019-05-12 DIAGNOSIS — I340 Nonrheumatic mitral (valve) insufficiency: Secondary | ICD-10-CM

## 2019-05-12 DIAGNOSIS — Z1231 Encounter for screening mammogram for malignant neoplasm of breast: Secondary | ICD-10-CM

## 2019-05-12 DIAGNOSIS — Z01812 Encounter for preprocedural laboratory examination: Secondary | ICD-10-CM

## 2019-05-12 NOTE — Progress Notes (Signed)
PCP:  [name], MD Primary Cardiologist: Will Meredith Leeds, MD Electrophysiologist: Dr. Curt Bears.  Anne Hogan is a 73 y.o. 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 They are seen for Dr. Curt Bears.   Since last being seen in our clinic, the patient reports doing well. She was having increase palpitations earlier this month, but these seem to have abated. She was aware that her MV was "leaky" but didn't think she would possibly need surgery. She is at peace with this. Her mother had severe MV as well she states, but diagnosed as MVP. She was told she would eventually need surgery, but passed away prior to from "heart issues".  She denies chest pain and is able to do her ADLs without SOB. No orthopnea, PND, or peripheral edema.   The patient feels that she is tolerating medications without difficulties and is otherwise without complaint today.   Past Medical History:  Diagnosis Date  . ADD (attention deficit disorder)   . Arthritis    "right knee" (04/19/2013)  . Claustrophobia   . Depression   . Fracture of right tibial plateau 05/2016  . GERD (gastroesophageal reflux disease)   . Heart murmur   . Migraine    "bad when I was in 5th or 6th grade; I can usually ward them off by resting my head now" (04/19/2013)  . MVP (mitral valve prolapse)   . Sleep apnea    has not used mouth guard in 5 years no cpap used   . Squamous carcinoma ?1999   "upper left groin" (04/19/2013)   Past Surgical History:  Procedure Laterality Date  . CHOLECYSTECTOMY  04/18/2013  . CHOLECYSTECTOMY N/A 04/18/2013   Procedure: LAPAROSCOPIC CHOLECYSTECTOMY WITH INTRAOPERATIVE CHOLANGIOGRAM;  Surgeon: Ralene Ok, MD;  Location: [place];  Service: General;  Laterality: N/A;  . COLONOSCOPY W/ POLYPECTOMY    . EYE SURGERY Bilateral FALL  2016   IOC FOR CATARACTS  . KNEE ARTHROSCOPY  Right 12/2007   "meniscus repair" (04/19/2013)  . RETINAL TEAR REPAIR CRYOTHERAPY Left ~ 1997  . TONSILLECTOMY  ~ 1952  . TOTAL KNEE ARTHROPLASTY Right 09/21/2016   Procedure: RIGHT TOTAL KNEE ARTHROPLASTY;  Surgeon: Gaynelle Arabian, MD;  Location: [facility];  Service: Orthopedics;  Laterality: Right;  requests 8mins with abductor block  . VARICOSE VEIN SURGERY Right 2000's  . WRIST SURGERY Right 1975   "had a knot taken off" (04/19/2013)    Current Outpatient Medications  Medication Sig Dispense Refill  . acetaminophen (TYLENOL) 500 MG tablet Take 500-1,000 mg by mouth every 6 (six) hours as needed for moderate pain (depends on pain if takes 1-2 tablets).    [name] albuterol (PROVENTIL) (2.5 MG/3ML) 0.083% nebulizer solution Take 2.5 mg by nebulization every 4 (four) hours as needed.     [name] alum hydroxide-mag trisilicate (GAVISCON) 19-50 MG CHEW chewable tablet Chew 2 tablets by mouth at bedtime as needed for indigestion or heartburn.    [name] aspirin 81 MG tablet Take 81 mg by mouth daily.    [name] buPROPion (WELLBUTRIN XL) 300 MG 24 hr tablet Take 300 mg by mouth daily.    Kendall Flack 575 MG/5ML SYRP Take as directed    . fluticasone furoate-vilanterol (BREO ELLIPTA) 100-25 MCG/INH AEPB USE 1 PUFF DAILY AS DIRECTED 60 each 3  . montelukast (SINGULAIR) 10 MG tablet Take 10 mg by mouth at bedtime.     [name]  polyethylene glycol (MIRALAX / GLYCOLAX) packet Take 8.5 g by mouth daily.    . sodium chloride (OCEAN) 0.65 % SOLN nasal spray Place 1 spray into both nostrils as needed for congestion.     No current facility-administered medications for this visit.     Allergies  Allergen Reactions  . Penicillins Rash    Has patient had a PCN reaction causing immediate rash, facial/tongue/throat swelling, SOB or lightheadedness with hypotension: unknown Has patient had a PCN reaction causing severe rash involving mucus membranes or skin necrosis: no Has patient had a PCN reaction that required hospitalization: unknown  Has patient had a PCN reaction occurring within the last 10 years: no If all of the above answers are "NO", then may proceed with Cephalosporin use.   . Codeine Other (See Comments)    Possible sensitivity, patient not sure  . Milk-Related Compounds Nausea And Vomiting and Other (See Comments)    Can have ice cream  . Morphine And Related     "strange sensation across midsection and chest"    Social History   Socioeconomic History  . Marital status: Widowed    Spouse name: Not on file  . Number of children: Not on file  . Years of education: Not on file  . Highest education level: Not on file  Occupational History  . Not on file  Social Needs  . Financial resource strain: Not on file  . Food insecurity    Worry: Not on file    Inability: Not on file  . Transportation needs    Medical: Not on file    Non-medical: Not on file  Tobacco Use  . Smoking status: Former Smoker    Packs/day: 1.50    Years: 20.00    Pack years: 30.00    Types: Cigarettes    Quit date: 11/30/1986    Years since quitting: 32.4  . Smokeless tobacco: Never Used  Substance and Sexual Activity  . Alcohol use: Yes    Comment: RARE  . Drug use: No  . Sexual activity: Never  Lifestyle  . Physical activity    Days per week: Not on file    Minutes per session: Not on file  . Stress: Not on file  Relationships  . Social Herbalist on phone: Not on file    Gets together: Not on file    Attends religious service: Not on file    Active member of club or organization: Not on file    Attends meetings of clubs or organizations: Not on file    Relationship status: Not on file  . Intimate partner violence    Fear of current or ex partner: Not on file    Emotionally abused: Not on file    Physically abused: Not on file    Forced sexual activity: Not on file  Other Topics Concern  . Not on file  Social History Narrative  . Not on file    Review of Systems: General: No chills, fever,  night sweats or weight changes  Cardiovascular:  No chest pain, dyspnea on exertion, edema, orthopnea, palpitations, paroxysmal nocturnal dyspnea Dermatological: No rash, lesions or masses Respiratory: No cough, dyspnea Urologic: No hematuria, dysuria Abdominal: No nausea, vomiting, diarrhea, bright red blood per rectum, melena, or hematemesis Neurologic: No visual changes, weakness, changes in mental status All other systems reviewed and are otherwise negative except as noted above.  Physical Exam: Vitals:   05/12/19 1127  BP: 114/74  Pulse:  76  Weight: 171 lb (77.6 kg)  Height: 5' 2.5" (1.588 m)    GEN- The patient is well appearing, alert and oriented x 3 today.   HEENT: normocephalic, atraumatic; sclera clear, conjunctiva pink; hearing intact; oropharynx clear; neck supple, no JVP Lymph- no cervical lymphadenopathy Lungs- Clear to ausculation bilaterally, normal work of breathing.  No wheezes, rales, rhonchi Heart- Regular rate and rhythm, no murmurs, rubs or gallops, PMI not laterally displaced GI- soft, non-tender, non-distended, bowel sounds present, no hepatosplenomegaly Extremities- no clubbing, cyanosis, or edema; DP/PT/radial pulses 2+ bilaterally MS- no significant deformity or atrophy Skin- warm and dry, no rash or lesion Psych- euthymic mood, full affect Neuro- strength and sensation are intact  EKG is ordered. Personal review shows NSR at 76 bpm with PVCs.  Assessment and Plan:  1. Severe mitral regurgitation due to myxomatous mitral valve Seen on most recent Echo.  Dr. Curt Bears has discussed with Dr. Burt Knack.  Will plan TEE and L/RHC on 11/6, and refer to Dr. Roxy Manns Pt aware and agreeable to proceed with work up and eventual surgery if determined to be candidate.  We will get pre cath-labs today and refer to Dr. Roxy Manns.   2. Palpitations These have improved. No change to current regimen or plan.  #1 likely contributing.  She will let us know if these worsen in the  interim.   She can follow up with EP as needed during this process, and will continue at least yearly follow up otherwise for the time being.   Shirley Friar, PA-C  05/12/19 1:12 PM

## 2019-05-12 NOTE — Patient Instructions (Signed)
Medication Instructions:  Your physician recommends that you continue on your current medications as directed. Please refer to the Current Medication list given to you today.  *If you need a refill on your cardiac medications before your next appointment, please call your pharmacy*  Lab Work: TODAY: CBC, BMET  If you have labs (blood work) drawn today and your tests are completely normal, you will receive your results only by: [name] MyChart Message (if you have MyChart) OR . A paper copy in the mail If you have any lab test that is abnormal or we need to change your treatment, we will call you to review the results.  Testing/Procedures: Your physician has requested that you have a TEE. During a TEE, sound waves are used to create images of your heart. It provides your doctor with information about the size and shape of your heart and how well your heart's chambers and valves are working. In this test, a transducer is attached to the end of a flexible tube that's guided down your throat and into your esophagus (the tube leading from you mouth to your stomach) to get a more detailed image of your heart. You are not awake for the procedure. Please see the instruction sheet given to you today. For further information please visit HugeFiesta.[email or web address].   Your physician has requested that you have a cardiac catheterization. Cardiac catheterization is used to diagnose and/or treat various heart conditions. Doctors may recommend this procedure for a number of different reasons. The most common reason is to evaluate chest pain. Chest pain can be a symptom of coronary artery disease (CAD), and cardiac catheterization can show whether plaque is narrowing or blocking your heart's arteries. This procedure is also used to evaluate the valves, as well as measure the blood flow and oxygen levels in different parts of your heart. For further information please visit [email or web address]. Please follow instruction sheet,  as given.  Follow-Up: You have been referred to Dr. Roxy Manns  Other Instructions    [facility] CARDIOVASCULAR DIVISION [place] OFFICE [place], [place]  [place] [id number] Dept: [phone] Loc: [phone]  Anne Hogan  05/12/2019  You are scheduled for a TEE on 05/26/19 with Dr. Johnsie Cancel.  Please arrive at the [facility] (Main Entrance A) at [facility]: [address] [place], [place] [address] at 9:00 AM. (1 hour prior to procedure    You are scheduled for a Cardiac Catheterization on Friday, November 6 with Dr. Larae Grooms at 12:00 PM. This will be after your TEE.  2. Diet: Nothing to eat or drink after midnight 3. Labs: TODAY: CBC, BMET  Your Pre-procedure COVID-19 Testing will be done on 05/23/19 at 2:30 PM at [facility] at [address], [place], [place] [address]. Once you arrive at the testing site, stay in the right hand lane, go under the building overhang not the tent. If you are tested under the tent your results may not be back before your procedure. Please be on time for your appointment.  After your swab you will be given a mask to wear and instructed to go home and quarantine/no visitors until after your procedure. If you test positive you will be notified and your procedure will be cancelled.    4. Medication instructions in preparation for your procedure:   Contrast Allergy: No   On the morning of your procedure, take your Aspirin and any of your regular morning  medicines.  You may use sips of water.  5. Plan for one night stay--bring personal belongings. 6. Bring a current list of your medications and current insurance cards. 7. You MUST have a responsible person to drive you home. 8. Someone MUST be with you the first 24 hours after you arrive home or your discharge will be delayed. 9. Please wear clothes that are easy to get on and off and wear slip-on  shoes.  Thank you for allowing [place] to care for you!   --  Invasive Cardiovascular services

## 2019-05-12 NOTE — H&P (View-Only) (Signed)
PCP:  [name], MD Primary Cardiologist: Will Meredith Leeds, MD Electrophysiologist: Dr. Curt Bears.  Anne Hogan is a 73 y.o. 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 They are seen for Dr. Curt Bears.   Since last being seen in our clinic, the patient reports doing well. She was having increase palpitations earlier this month, but these seem to have abated. She was aware that her MV was "leaky" but didn't think she would possibly need surgery. She is at peace with this. Her mother had severe MV as well she states, but diagnosed as MVP. She was told she would eventually need surgery, but passed away prior to from "heart issues".  She denies chest pain and is able to do her ADLs without SOB. No orthopnea, PND, or peripheral edema.   The patient feels that she is tolerating medications without difficulties and is otherwise without complaint today.   Past Medical History:  Diagnosis Date  . ADD (attention deficit disorder)   . Arthritis    "right knee" (04/19/2013)  . Claustrophobia   . Depression   . Fracture of right tibial plateau 05/2016  . GERD (gastroesophageal reflux disease)   . Heart murmur   . Migraine    "bad when I was in 5th or 6th grade; I can usually ward them off by resting my head now" (04/19/2013)  . MVP (mitral valve prolapse)   . Sleep apnea    has not used mouth guard in 5 years no cpap used   . Squamous carcinoma ?1999   "upper left groin" (04/19/2013)   Past Surgical History:  Procedure Laterality Date  . CHOLECYSTECTOMY  04/18/2013  . CHOLECYSTECTOMY N/A 04/18/2013   Procedure: LAPAROSCOPIC CHOLECYSTECTOMY WITH INTRAOPERATIVE CHOLANGIOGRAM;  Surgeon: Ralene Ok, MD;  Location: [facility];  Service: General;  Laterality: N/A;  . COLONOSCOPY W/ POLYPECTOMY    . EYE SURGERY Bilateral FALL  2016   IOC FOR CATARACTS  . KNEE ARTHROSCOPY  Right 12/2007   "meniscus repair" (04/19/2013)  . RETINAL TEAR REPAIR CRYOTHERAPY Left ~ 1997  . TONSILLECTOMY  ~ 1952  . TOTAL KNEE ARTHROPLASTY Right 09/21/2016   Procedure: RIGHT TOTAL KNEE ARTHROPLASTY;  Surgeon: Gaynelle Arabian, MD;  Location: [facility];  Service: Orthopedics;  Laterality: Right;  requests 84mins with abductor block  . VARICOSE VEIN SURGERY Right 2000's  . WRIST SURGERY Right 1975   "had a knot taken off" (04/19/2013)    Current Outpatient Medications  Medication Sig Dispense Refill  . acetaminophen (TYLENOL) 500 MG tablet Take 500-1,000 mg by mouth every 6 (six) hours as needed for moderate pain (depends on pain if takes 1-2 tablets).    [name] albuterol (PROVENTIL) (2.5 MG/3ML) 0.083% nebulizer solution Take 2.5 mg by nebulization every 4 (four) hours as needed.     [name] alum hydroxide-mag trisilicate (GAVISCON) 47-42 MG CHEW chewable tablet Chew 2 tablets by mouth at bedtime as needed for indigestion or heartburn.    [name] aspirin 81 MG tablet Take 81 mg by mouth daily.    [name] buPROPion (WELLBUTRIN XL) 300 MG 24 hr tablet Take 300 mg by mouth daily.    Kendall Flack 575 MG/5ML SYRP Take as directed    . fluticasone furoate-vilanterol (BREO ELLIPTA) 100-25 MCG/INH AEPB USE 1 PUFF DAILY AS DIRECTED 60 each 3  . montelukast (SINGULAIR) 10 MG tablet Take 10 mg by mouth at bedtime.     [name]  polyethylene glycol (MIRALAX / GLYCOLAX) packet Take 8.5 g by mouth daily.    . sodium chloride (OCEAN) 0.65 % SOLN nasal spray Place 1 spray into both nostrils as needed for congestion.     No current facility-administered medications for this visit.     Allergies  Allergen Reactions  . Penicillins Rash    Has patient had a PCN reaction causing immediate rash, facial/tongue/throat swelling, SOB or lightheadedness with hypotension: unknown Has patient had a PCN reaction causing severe rash involving mucus membranes or skin necrosis: no Has patient had a PCN reaction that required hospitalization: unknown  Has patient had a PCN reaction occurring within the last 10 years: no If all of the above answers are "NO", then may proceed with Cephalosporin use.   . Codeine Other (See Comments)    Possible sensitivity, patient not sure  . Milk-Related Compounds Nausea And Vomiting and Other (See Comments)    Can have ice cream  . Morphine And Related     "strange sensation across midsection and chest"    Social History   Socioeconomic History  . Marital status: Widowed    Spouse name: Not on file  . Number of children: Not on file  . Years of education: Not on file  . Highest education level: Not on file  Occupational History  . Not on file  Social Needs  . Financial resource strain: Not on file  . Food insecurity    Worry: Not on file    Inability: Not on file  . Transportation needs    Medical: Not on file    Non-medical: Not on file  Tobacco Use  . Smoking status: Former Smoker    Packs/day: 1.50    Years: 20.00    Pack years: 30.00    Types: Cigarettes    Quit date: 11/30/1986    Years since quitting: 32.4  . Smokeless tobacco: Never Used  Substance and Sexual Activity  . Alcohol use: Yes    Comment: RARE  . Drug use: No  . Sexual activity: Never  Lifestyle  . Physical activity    Days per week: Not on file    Minutes per session: Not on file  . Stress: Not on file  Relationships  . Social Herbalist on phone: Not on file    Gets together: Not on file    Attends religious service: Not on file    Active member of club or organization: Not on file    Attends meetings of clubs or organizations: Not on file    Relationship status: Not on file  . Intimate partner violence    Fear of current or ex partner: Not on file    Emotionally abused: Not on file    Physically abused: Not on file    Forced sexual activity: Not on file  Other Topics Concern  . Not on file  Social History Narrative  . Not on file    Review of Systems: General: No chills, fever,  night sweats or weight changes  Cardiovascular:  No chest pain, dyspnea on exertion, edema, orthopnea, palpitations, paroxysmal nocturnal dyspnea Dermatological: No rash, lesions or masses Respiratory: No cough, dyspnea Urologic: No hematuria, dysuria Abdominal: No nausea, vomiting, diarrhea, bright red blood per rectum, melena, or hematemesis Neurologic: No visual changes, weakness, changes in mental status All other systems reviewed and are otherwise negative except as noted above.  Physical Exam: Vitals:   05/12/19 1127  BP: 114/74  Pulse:  76  Weight: 171 lb (77.6 kg)  Height: 5' 2.5" (1.588 m)    GEN- The patient is well appearing, alert and oriented x 3 today.   HEENT: normocephalic, atraumatic; sclera clear, conjunctiva pink; hearing intact; oropharynx clear; neck supple, no JVP Lymph- no cervical lymphadenopathy Lungs- Clear to ausculation bilaterally, normal work of breathing.  No wheezes, rales, rhonchi Heart- Regular rate and rhythm, no murmurs, rubs or gallops, PMI not laterally displaced GI- soft, non-tender, non-distended, bowel sounds present, no hepatosplenomegaly Extremities- no clubbing, cyanosis, or edema; DP/PT/radial pulses 2+ bilaterally MS- no significant deformity or atrophy Skin- warm and dry, no rash or lesion Psych- euthymic mood, full affect Neuro- strength and sensation are intact  EKG is ordered. Personal review shows NSR at 76 bpm with PVCs.  Assessment and Plan:  1. Severe mitral regurgitation due to myxomatous mitral valve Seen on most recent Echo.  Dr. Curt Bears has discussed with Dr. Burt Knack.  Will plan TEE and L/RHC on 11/6, and refer to Dr. Roxy Manns Pt aware and agreeable to proceed with work up and eventual surgery if determined to be candidate.  We will get pre cath-labs today and refer to Dr. Roxy Manns.   2. Palpitations These have improved. No change to current regimen or plan.  #1 likely contributing.  She will let us know if these worsen in the  interim.   She can follow up with EP as needed during this process, and will continue at least yearly follow up otherwise for the time being.   Shirley Friar, PA-C  05/12/19 1:12 PM

## 2019-05-13 LAB — BASIC METABOLIC PANEL
BUN/Creatinine Ratio: 14 (ref 12–28)
BUN: 14 mg/dL (ref 8–27)
CO2: 23 mmol/L (ref 20–29)
Calcium: 9.1 mg/dL (ref 8.7–10.3)
Chloride: 104 mmol/L (ref 96–106)
Creatinine, Ser: 0.98 mg/dL (ref 0.57–1.00)
GFR calc Af Amer: 66 mL/min/{1.73_m2} (ref >59)
GFR calc non Af Amer: 57 mL/min/{1.73_m2} — ABNORMAL LOW (ref >59)
Glucose: 97 mg/dL (ref 65–99)
Potassium: 4.9 mmol/L (ref 3.5–5.2)
Sodium: 140 mmol/L (ref 134–144)

## 2019-05-13 LAB — CBC
Hematocrit: 40.2 % (ref 34.0–46.6)
Hemoglobin: 13.5 g/dL (ref 11.1–15.9)
MCH: 29.6 pg (ref 26.6–33.0)
MCHC: 33.6 g/dL (ref 31.5–35.7)
MCV: 88 fL (ref 79–97)
Platelets: 285 10*3/uL (ref 150–450)
RBC: 4.56 x10E6/uL (ref 3.77–5.28)
RDW: 12.5 % (ref 11.7–15.4)
WBC: 5.2 10*3/uL (ref 3.4–10.8)

## 2019-05-15 ENCOUNTER — Other Ambulatory Visit: Payer: Self-pay | Admitting: Internal Medicine

## 2019-05-15 DIAGNOSIS — N183 Chronic kidney disease, stage 3 unspecified: Secondary | ICD-10-CM

## 2019-05-19 ENCOUNTER — Ambulatory Visit
Admission: RE | Admit: 2019-05-19 | Discharge: 2019-05-19 | Disposition: A | Discharge status: Home / Self Care | Payer: PPO | Source: Ambulatory Visit | Attending: Internal Medicine | Admitting: Internal Medicine

## 2019-05-19 DIAGNOSIS — N183 Chronic kidney disease, stage 3 unspecified: Secondary | ICD-10-CM | POA: Diagnosis not present

## 2019-05-19 DIAGNOSIS — N281 Cyst of kidney, acquired: Secondary | ICD-10-CM | POA: Diagnosis not present

## 2019-05-23 ENCOUNTER — Other Ambulatory Visit (HOSPITAL_COMMUNITY)
Admission: RE | Admit: 2019-05-23 | Discharge: 2019-05-23 | Disposition: A | Discharge status: Home / Self Care | Payer: PPO | Source: Ambulatory Visit | Attending: Interventional Cardiology | Admitting: Interventional Cardiology

## 2019-05-23 DIAGNOSIS — Z01812 Encounter for preprocedural laboratory examination: Secondary | ICD-10-CM | POA: Diagnosis not present

## 2019-05-23 DIAGNOSIS — Z20828 Contact with and (suspected) exposure to other viral communicable diseases: Secondary | ICD-10-CM | POA: Diagnosis not present

## 2019-05-24 LAB — NOVEL CORONAVIRUS, NAA (HOSP ORDER, SEND-OUT TO REF LAB; TAT 18-24 HRS): SARS-CoV-2, NAA: NOT DETECTED

## 2019-05-25 ENCOUNTER — Telehealth: Payer: Self-pay | Admitting: *Deleted

## 2019-05-25 NOTE — Progress Notes (Signed)
Spoke with patient; has been quarantined since Covid test, no symptoms.  All questions answered.

## 2019-05-25 NOTE — Telephone Encounter (Signed)
Pt contacted pre-TEE/cardiac catheterization  at [facility] for: Friday May 26, 2019 12 Noon/TEE 10 AM Verified arrival time and place: [facility]) at: 9 AM   Nothing to eat or drink after midnight prior to procedures. Contrast allergy: no  AM meds can be  taken pre-cath with sip of water including: ASA 81 mg   Confirmed patient has responsible adult to drive home post procedure and observe 24 hours after arriving home: yes  Currently, due to Covid-19 pandemic, only one support person will be allowed with patient. Must be the same support person for that patient's entire stay, will be screened and required to wear a mask. They will be asked to wait in the waiting room for the duration of the patient's stay.  Patients are required to wear a mask when they enter the hospital.      COVID-19 Pre-Screening Questions:  . In the past 7 to 10 days have you had a cough,  shortness of breath, headache, congestion, fever (100 or greater) body aches, chills, sore throat, or sudden loss of taste or sense of smell? no . Have you been around anyone with known Covid 19? no . Have you been around anyone who is awaiting Covid 19 test results in the past 7 to 10 days? no . Have you been around anyone who has been exposed to Covid 19, or has mentioned symptoms of Covid 19 within the past 7 to 10 days? no   I reviewed procedure/mask/visitor instructions, Covid-19 screening questions with patient, she verbalized understanding, thanked me for call.

## 2019-05-26 ENCOUNTER — Ambulatory Visit (HOSPITAL_COMMUNITY)
Admission: RE | Admit: 2019-05-26 | Discharge: 2019-05-26 | Disposition: A | Discharge status: Home / Self Care | Payer: PPO | Attending: Cardiovascular Disease | Admitting: Cardiovascular Disease

## 2019-05-26 ENCOUNTER — Encounter (HOSPITAL_COMMUNITY)
Admission: RE | Disposition: A | Discharge status: Home / Self Care | Payer: Self-pay | Source: Home / Self Care | Attending: Cardiovascular Disease

## 2019-05-26 ENCOUNTER — Ambulatory Visit (HOSPITAL_BASED_OUTPATIENT_CLINIC_OR_DEPARTMENT_OTHER): Payer: PPO

## 2019-05-26 ENCOUNTER — Encounter (HOSPITAL_COMMUNITY): Payer: Self-pay | Admitting: *Deleted

## 2019-05-26 ENCOUNTER — Other Ambulatory Visit: Payer: Self-pay

## 2019-05-26 ENCOUNTER — Ambulatory Visit (HOSPITAL_COMMUNITY): Payer: PPO | Admitting: Anesthesiology

## 2019-05-26 DIAGNOSIS — Z7982 Long term (current) use of aspirin: Secondary | ICD-10-CM | POA: Diagnosis not present

## 2019-05-26 DIAGNOSIS — Z7951 Long term (current) use of inhaled steroids: Secondary | ICD-10-CM | POA: Insufficient documentation

## 2019-05-26 DIAGNOSIS — F329 Major depressive disorder, single episode, unspecified: Secondary | ICD-10-CM | POA: Diagnosis not present

## 2019-05-26 DIAGNOSIS — Z885 Allergy status to narcotic agent status: Secondary | ICD-10-CM | POA: Diagnosis not present

## 2019-05-26 DIAGNOSIS — I361 Nonrheumatic tricuspid (valve) insufficiency: Secondary | ICD-10-CM

## 2019-05-26 DIAGNOSIS — Z880 Allergy status to penicillin: Secondary | ICD-10-CM | POA: Diagnosis not present

## 2019-05-26 DIAGNOSIS — I340 Nonrheumatic mitral (valve) insufficiency: Secondary | ICD-10-CM | POA: Diagnosis not present

## 2019-05-26 DIAGNOSIS — Z79899 Other long term (current) drug therapy: Secondary | ICD-10-CM | POA: Insufficient documentation

## 2019-05-26 DIAGNOSIS — K219 Gastro-esophageal reflux disease without esophagitis: Secondary | ICD-10-CM | POA: Insufficient documentation

## 2019-05-26 DIAGNOSIS — G4730 Sleep apnea, unspecified: Secondary | ICD-10-CM | POA: Insufficient documentation

## 2019-05-26 DIAGNOSIS — F418 Other specified anxiety disorders: Secondary | ICD-10-CM | POA: Diagnosis not present

## 2019-05-26 DIAGNOSIS — Z87891 Personal history of nicotine dependence: Secondary | ICD-10-CM | POA: Diagnosis not present

## 2019-05-26 DIAGNOSIS — R002 Palpitations: Secondary | ICD-10-CM | POA: Insufficient documentation

## 2019-05-26 DIAGNOSIS — I083 Combined rheumatic disorders of mitral, aortic and tricuspid valves: Secondary | ICD-10-CM | POA: Diagnosis not present

## 2019-05-26 DIAGNOSIS — G43909 Migraine, unspecified, not intractable, without status migrainosus: Secondary | ICD-10-CM | POA: Diagnosis not present

## 2019-05-26 HISTORY — PX: TEE WITHOUT CARDIOVERSION: SHX5443

## 2019-05-26 HISTORY — PX: CARDIAC CATHETERIZATION: SHX172

## 2019-05-26 HISTORY — PX: RIGHT/LEFT HEART CATH AND CORONARY ANGIOGRAPHY: CATH118266

## 2019-05-26 LAB — POCT I-STAT EG7
Acid-base deficit: 1 mmol/L (ref 0.0–2.0)
Bicarbonate: 25.4 mmol/L (ref 20.0–28.0)
Calcium, Ion: 1.23 mmol/L (ref 1.15–1.40)
HCT: 36 % (ref 36.0–46.0)
Hemoglobin: 12.2 g/dL (ref 12.0–15.0)
O2 Saturation: 75 %
Potassium: 3.9 mmol/L (ref 3.5–5.1)
Sodium: 142 mmol/L (ref 135–145)
TCO2: 27 mmol/L (ref 22–32)
pCO2, Ven: 48.5 mmHg (ref 44.0–60.0)
pH, Ven: 7.327 (ref 7.250–7.430)
pO2, Ven: 43 mmHg (ref 32.0–45.0)

## 2019-05-26 LAB — POCT I-STAT 7, (LYTES, BLD GAS, ICA,H+H)
Acid-base deficit: 1 mmol/L (ref 0.0–2.0)
Bicarbonate: 24.7 mmol/L (ref 20.0–28.0)
Calcium, Ion: 1.21 mmol/L (ref 1.15–1.40)
HCT: 36 % (ref 36.0–46.0)
Hemoglobin: 12.2 g/dL (ref 12.0–15.0)
O2 Saturation: 99 %
Potassium: 4 mmol/L (ref 3.5–5.1)
Sodium: 142 mmol/L (ref 135–145)
TCO2: 26 mmol/L (ref 22–32)
pCO2 arterial: 45.2 mmHg (ref 32.0–48.0)
pH, Arterial: 7.346 — ABNORMAL LOW (ref 7.350–7.450)
pO2, Arterial: 138 mmHg — ABNORMAL HIGH (ref 83.0–108.0)

## 2019-05-26 SURGERY — ECHOCARDIOGRAM, TRANSESOPHAGEAL
Anesthesia: Monitor Anesthesia Care

## 2019-05-26 SURGERY — RIGHT/LEFT HEART CATH AND CORONARY ANGIOGRAPHY
Anesthesia: LOCAL

## 2019-05-26 MED ORDER — ASPIRIN 81 MG PO CHEW: 81.0000 mg | CHEWABLE_TABLET | ORAL | Status: DC

## 2019-05-26 MED ORDER — LIDOCAINE HCL (PF) 1 % IJ SOLN
INTRAMUSCULAR | Status: DC | PRN
  Administered 2019-05-26 (×2): 2 mL

## 2019-05-26 MED ORDER — FENTANYL CITRATE (PF) 100 MCG/2ML IJ SOLN
INTRAMUSCULAR | Status: AC
  Filled 2019-05-26: qty 2

## 2019-05-26 MED ORDER — SODIUM CHLORIDE 0.9 % IV SOLN: 250.0000 mL | INTRAVENOUS | Status: DC | PRN

## 2019-05-26 MED ORDER — SODIUM CHLORIDE 0.9 % IV SOLN: INTRAVENOUS | Status: DC

## 2019-05-26 MED ORDER — ONDANSETRON HCL 4 MG/2ML IJ SOLN: 4.0000 mg | Freq: Four times a day (QID) | INTRAMUSCULAR | Status: DC | PRN

## 2019-05-26 MED ORDER — HEPARIN (PORCINE) IN NACL 1000-0.9 UT/500ML-% IV SOLN
INTRAVENOUS | Status: DC | PRN
  Administered 2019-05-26 (×2): 500 mL

## 2019-05-26 MED ORDER — PROPOFOL 500 MG/50ML IV EMUL
INTRAVENOUS | Status: DC | PRN
  Administered 2019-05-26: 100 ug/kg/min via INTRAVENOUS

## 2019-05-26 MED ORDER — VERAPAMIL HCL 2.5 MG/ML IV SOLN
INTRAVENOUS | Status: DC | PRN
  Administered 2019-05-26: 10 mL via INTRA_ARTERIAL

## 2019-05-26 MED ORDER — SODIUM CHLORIDE 0.9% FLUSH: 3.0000 mL | Freq: Two times a day (BID) | INTRAVENOUS | Status: DC

## 2019-05-26 MED ORDER — ACETAMINOPHEN 325 MG PO TABS: 650.0000 mg | ORAL_TABLET | ORAL | Status: DC | PRN

## 2019-05-26 MED ORDER — LABETALOL HCL 5 MG/ML IV SOLN: 10.0000 mg | INTRAVENOUS | Status: DC | PRN

## 2019-05-26 MED ORDER — VERAPAMIL HCL 2.5 MG/ML IV SOLN
INTRAVENOUS | Status: AC
  Filled 2019-05-26: qty 2

## 2019-05-26 MED ORDER — SODIUM CHLORIDE 0.9% FLUSH: 3.0000 mL | INTRAVENOUS | Status: DC | PRN

## 2019-05-26 MED ORDER — SODIUM CHLORIDE 0.9 % IV SOLN
INTRAVENOUS | Status: DC | PRN
  Administered 2019-05-26: 10:00:00 via INTRAVENOUS

## 2019-05-26 MED ORDER — HEPARIN (PORCINE) IN NACL 1000-0.9 UT/500ML-% IV SOLN
INTRAVENOUS | Status: AC
  Filled 2019-05-26: qty 1000

## 2019-05-26 MED ORDER — HEPARIN SODIUM (PORCINE) 1000 UNIT/ML IJ SOLN
INTRAMUSCULAR | Status: AC
  Filled 2019-05-26: qty 1

## 2019-05-26 MED ORDER — HEPARIN SODIUM (PORCINE) 1000 UNIT/ML IJ SOLN
INTRAMUSCULAR | Status: DC | PRN
  Administered 2019-05-26: 4000 [IU] via INTRAVENOUS

## 2019-05-26 MED ORDER — SODIUM CHLORIDE 0.9 % IV SOLN
INTRAVENOUS | Status: AC | PRN
  Administered 2019-05-26: 10 mL/h via INTRAVENOUS

## 2019-05-26 MED ORDER — MIDAZOLAM HCL 2 MG/2ML IJ SOLN
INTRAMUSCULAR | Status: AC
  Filled 2019-05-26: qty 2

## 2019-05-26 MED ORDER — FENTANYL CITRATE (PF) 100 MCG/2ML IJ SOLN
INTRAMUSCULAR | Status: DC | PRN
  Administered 2019-05-26: 25 ug via INTRAVENOUS

## 2019-05-26 MED ORDER — HYDRALAZINE HCL 20 MG/ML IJ SOLN: 10.0000 mg | INTRAMUSCULAR | Status: DC | PRN

## 2019-05-26 MED ORDER — PROPOFOL 10 MG/ML IV BOLUS
INTRAVENOUS | Status: DC | PRN
  Administered 2019-05-26: 50 mg via INTRAVENOUS

## 2019-05-26 MED ORDER — MIDAZOLAM HCL 2 MG/2ML IJ SOLN
INTRAMUSCULAR | Status: DC | PRN
  Administered 2019-05-26: 2 mg via INTRAVENOUS

## 2019-05-26 MED ORDER — LIDOCAINE HCL (PF) 1 % IJ SOLN
INTRAMUSCULAR | Status: AC
  Filled 2019-05-26: qty 30

## 2019-05-26 MED ORDER — IOHEXOL 350 MG/ML SOLN
INTRAVENOUS | Status: DC | PRN
  Administered 2019-05-26: 16:00:00 80 mL

## 2019-05-26 SURGICAL SUPPLY — 12 items
CATH 5FR JL3.5 JR4 ANG PIG MP (CATHETERS) ×1 IMPLANT
CATH BALLN WEDGE 5F 110CM (CATHETERS) ×1 IMPLANT
DEVICE RAD COMP TR BAND LRG (VASCULAR PRODUCTS) ×1 IMPLANT
GLIDESHEATH SLEND SS 6F .021 (SHEATH) ×1 IMPLANT
GUIDEWIRE INQWIRE 1.5J.035X260 (WIRE) IMPLANT
INQWIRE 1.5J .035X260CM (WIRE) ×2
KIT HEART LEFT (KITS) ×2 IMPLANT
PACK CARDIAC CATHETERIZATION (CUSTOM PROCEDURE TRAY) ×2 IMPLANT
SHEATH GLIDE SLENDER 4/5FR (SHEATH) ×1 IMPLANT
SYR MEDRAD MARK 7 150ML (SYRINGE) ×2 IMPLANT
TRANSDUCER W/STOPCOCK (MISCELLANEOUS) ×2 IMPLANT
TUBING CIL FLEX 10 FLL-RA (TUBING) ×2 IMPLANT

## 2019-05-26 NOTE — Transfer of Care (Signed)
Immediate Anesthesia Transfer of Care Note  Patient: Anne Hogan  Procedure(s) Performed: TRANSESOPHAGEAL ECHOCARDIOGRAM (TEE) (N/A )  Patient Location: PACU and Endoscopy Unit  Anesthesia Type:MAC  Level of Consciousness: drowsy  Airway & Oxygen Therapy: Patient Spontanous Breathing and Patient connected to nasal cannula oxygen  Post-op Assessment: Report given to RN and Post -op Vital signs reviewed and stable  Post vital signs: Reviewed and stable  Last Vitals:  Vitals Value Taken Time  BP 125/70 05/26/19 1054  Temp 36.6 C 05/26/19 1054  Pulse 71 05/26/19 1054  Resp 19 05/26/19 1054  SpO2 98 % 05/26/19 1054    Last Pain:  Vitals:   05/26/19 1054  TempSrc: Temporal  PainSc: Asleep         Complications: No apparent anesthesia complications

## 2019-05-26 NOTE — Discharge Instructions (Signed)
Radial Site Care ° °This sheet gives you information about how to care for yourself after your procedure. Your health care provider may also give you more specific instructions. If you have problems or questions, contact your health care provider. °What can I expect after the procedure? °After the procedure, it is common to have: °· Bruising and tenderness at the catheter insertion area. °Follow these instructions at home: °Medicines °· Take over-the-counter and prescription medicines only as told by your health care provider. °Insertion site care °· Follow instructions from your health care provider about how to take care of your insertion site. Make sure you: °? Wash your hands with soap and water before you change your bandage (dressing). If soap and water are not available, use hand sanitizer. °? Change your dressing as told by your health care provider. °? Leave stitches (sutures), skin glue, or adhesive strips in place. These skin closures may need to stay in place for 2 weeks or longer. If adhesive strip edges start to loosen and curl up, you may trim the loose edges. Do not remove adhesive strips completely unless your health care provider tells you to do that. °· Check your insertion site every day for signs of infection. Check for: °? Redness, swelling, or pain. °? Fluid or blood. °? Pus or a bad smell. °? Warmth. °· Do not take baths, swim, or use a hot tub until your health care provider approves. °· You may shower 24-48 hours after the procedure, or as directed by your health care provider. °? Remove the dressing and gently wash the site with plain soap and water. °? Pat the area dry with a clean towel. °? Do not rub the site. That could cause bleeding. °· Do not apply powder or lotion to the site. °Activity ° °· For 24 hours after the procedure, or as directed by your health care provider: °? Do not flex or bend the affected arm. °? Do not push or pull heavy objects with the affected arm. °? Do not  drive yourself home from the hospital or clinic. You may drive 24 hours after the procedure unless your health care provider tells you not to. °? Do not operate machinery or power tools. °· Do not lift anything that is heavier than 10 lb (4.5 kg), or the limit that you are told, until your health care provider says that it is safe. °· Ask your health care provider when it is okay to: °? Return to work or school. °? Resume usual physical activities or sports. °? Resume sexual activity. °General instructions °· If the catheter site starts to bleed, raise your arm and put firm pressure on the site. If the bleeding does not stop, get help right away. This is a medical emergency. °· If you went home on the same day as your procedure, a responsible adult should be with you for the first 24 hours after you arrive home. °· Keep all follow-up visits as told by your health care provider. This is important. °Contact a health care provider if: °· You have a fever. °· You have redness, swelling, or yellow drainage around your insertion site. °Get help right away if: °· You have unusual pain at the radial site. °· The catheter insertion area swells very fast. °· The insertion area is bleeding, and the bleeding does not stop when you hold steady pressure on the area. °· Your arm or hand becomes pale, cool, tingly, or numb. °These symptoms may represent a serious problem   that is an emergency. Do not wait to see if the symptoms will go away. Get medical help right away. Call your local emergency services (911 in the [place] Do not drive yourself to the hospital. °Summary °· After the procedure, it is common to have bruising and tenderness at the site. °· Follow instructions from your health care provider about how to take care of your radial site wound. Check the wound every day for signs of infection. °· Do not lift anything that is heavier than 10 lb (4.5 kg), or the limit that you are told, until your health care provider says  that it is safe. °This information is not intended to replace advice given to you by your health care provider. Make sure you discuss any questions you have with your health care provider. °Document Released: 08/08/2010 Document Revised: 08/11/2017 Document Reviewed: 08/11/2017 °Elsevier Patient Education © 2020 [employer] ° °

## 2019-05-26 NOTE — Interval H&P Note (Signed)
Cath Lab Visit (complete for each Cath Lab visit)  Clinical Evaluation Leading to the Procedure:   ACS: No.  Non-ACS:    Anginal Classification: CCS II  Anti-ischemic medical therapy: Minimal Therapy (1 class of medications)  Non-Invasive Test Results: No non-invasive testing performed  Prior CABG: No previous CABG   Pre MV repair cath.  R and L heart cath. No plan for PCI.   History and Physical Interval Note:  05/26/2019 3:21 PM  Anne Hogan  has presented today for surgery, with the diagnosis of Mitral regurgitation.  The various methods of treatment have been discussed with the patient and family. After consideration of risks, benefits and other options for treatment, the patient has consented to  Procedure(s): RIGHT/LEFT HEART CATH AND CORONARY ANGIOGRAPHY (N/A) as a surgical intervention.  The patient's history has been reviewed, patient examined, no change in status, stable for surgery.  I have reviewed the patient's chart and labs.  Questions were answered to the patient's satisfaction.     Larae Grooms

## 2019-05-26 NOTE — Interval H&P Note (Signed)
History and Physical Interval Note:  05/26/2019 9:17 AM  Anne Hogan  has presented today for surgery, with the diagnosis of MITRAL REGURGITATION.  The various methods of treatment have been discussed with the patient and family. After consideration of risks, benefits and other options for treatment, the patient has consented to  Procedure(s): TRANSESOPHAGEAL ECHOCARDIOGRAM (TEE) (N/A) as a surgical intervention.  The patient's history has been reviewed, patient examined, no change in status, stable for surgery.  I have reviewed the patient's chart and labs.  Questions were answered to the patient's satisfaction.     Jenkins Rouge

## 2019-05-26 NOTE — Progress Notes (Signed)
  Echocardiogram 2D Echocardiogram has been performed.  Anne Hogan 05/26/2019, 10:51 AM

## 2019-05-26 NOTE — Anesthesia Postprocedure Evaluation (Signed)
Anesthesia Post Note  Patient: Anne Hogan  Procedure(s) Performed: TRANSESOPHAGEAL ECHOCARDIOGRAM (TEE) (N/A )     Patient location during evaluation: PACU Anesthesia Type: MAC Level of consciousness: awake and alert Pain management: pain level controlled Vital Signs Assessment: post-procedure vital signs reviewed and stable Respiratory status: spontaneous breathing, nonlabored ventilation, respiratory function stable and patient connected to nasal cannula oxygen Cardiovascular status: stable and blood pressure returned to baseline Postop Assessment: no apparent nausea or vomiting Anesthetic complications: no    Last Vitals:  Vitals:   05/26/19 1054 05/26/19 1104  BP: 125/70 127/73  Pulse: 71 69  Resp: 19 14  Temp: 36.6 C   SpO2: 98% 97%    Last Pain:  Vitals:   05/26/19 1104  TempSrc:   PainSc: 0-No pain                 Jamauri Kruzel DAVID

## 2019-05-26 NOTE — Anesthesia Preprocedure Evaluation (Addendum)
Anesthesia Evaluation  Patient identified by MRN, date of birth, ID band Patient awake    Reviewed: Allergy & Precautions, NPO status , Patient's Chart, lab work & pertinent test results  History of Anesthesia Complications Negative for: history of anesthetic complications  Airway Mallampati: I  TM Distance: >3 FB Neck ROM: Full    Dental   Pulmonary sleep apnea , former smoker,    Pulmonary exam normal        Cardiovascular Normal cardiovascular exam+ Valvular Problems/Murmurs MVP and MR    '20 TTE - EF >65%. Echo evidence of pseudonormalization in diastolic relaxation. Elevated mean left atrial pressure. Left atrial size was severely dilated. Severe mitral valve prolapse, posterior> anterior leaflet. The mitral valve is myxomatous. There is moderate thickening of the anterior and posterior leaflets. Mitral valve regurgitation is severe. The MR jet is eccentric anteriorly and medially directed. The estimated effective regurgitant orifice area is 0.7 cm sq. by the PISA method. The regurgitant volume is 100 mL, regurgitant fraction is 60%. There is flow reversal in the pulmonary veins. Right ventricular systolic pressure is mildly elevated with an estimated pressure of 37 mmHg. Aortic valve regurgitation is mild.    Neuro/Psych  Headaches, PSYCHIATRIC DISORDERS Anxiety Depression    GI/Hepatic Neg liver ROS, GERD  Medicated and Controlled,  Endo/Other   Obesity   Renal/GU negative Renal ROS     Musculoskeletal  (+) Arthritis ,   Abdominal   Peds  (+) ATTENTION DEFICIT DISORDER WITHOUT HYPERACTIVITY Hematology negative hematology ROS (+)   Anesthesia Other Findings   Reproductive/Obstetrics                            Anesthesia Physical Anesthesia Plan  ASA: III  Anesthesia Plan: MAC   Post-op Pain Management:    Induction: Intravenous  PONV Risk Score and Plan: 2 and Propofol infusion  and Treatment may vary due to age or medical condition  Airway Management Planned: Nasal Cannula and Natural Airway  Additional Equipment: None  Intra-op Plan:   Post-operative Plan:   Informed Consent: I have reviewed the patients History and Physical, chart, labs and discussed the procedure including the risks, benefits and alternatives for the proposed anesthesia with the patient or authorized representative who has indicated his/her understanding and acceptance.       Plan Discussed with: CRNA and Surgeon  Anesthesia Plan Comments:        Anesthesia Quick Evaluation

## 2019-05-26 NOTE — Progress Notes (Signed)
  Echocardiogram 2D Echocardiogram has been performed.  Anne Hogan 05/26/2019, 11:04 AM

## 2019-05-26 NOTE — CV Procedure (Signed)
TEE: Anesthesia: Propofol 3D Imaging of MV   Severe eccentric MR directed anteriorly Flail P2 segment and prolapse of P3 as well Moderate to Severe TR with elevated PA systolic pressures Tri leaflet AV with mild AR PFO present  Normal LV size and EF 60%  Mild RV enlargement  No effusion   For right and left cath latter today and surgical consult with Dr Roxy Manns. Will need to consider TV annuloplasty as well as PFO closure with complex MV repair   Jenkins Rouge

## 2019-05-26 NOTE — Progress Notes (Signed)
Discharge instructions reviewed with pt and Dr Clearence Cheek both voice understanding.

## 2019-05-28 ENCOUNTER — Encounter (HOSPITAL_COMMUNITY): Payer: Self-pay | Admitting: Cardiovascular Disease

## 2019-05-29 ENCOUNTER — Encounter (HOSPITAL_COMMUNITY): Payer: Self-pay | Admitting: Interventional Cardiology

## 2019-05-29 ENCOUNTER — Other Ambulatory Visit: Payer: Self-pay

## 2019-05-29 ENCOUNTER — Encounter: Payer: Self-pay | Admitting: *Deleted

## 2019-05-29 ENCOUNTER — Other Ambulatory Visit: Payer: Self-pay | Admitting: Thoracic Surgery (Cardiothoracic Vascular Surgery)

## 2019-05-29 ENCOUNTER — Institutional Professional Consult (permissible substitution): Payer: PPO | Admitting: Thoracic Surgery (Cardiothoracic Vascular Surgery)

## 2019-05-29 VITALS — BP 110/73 | HR 86 | Temp 96.4°F | Resp 16 | Ht 62.75 in | Wt 170.0 lb

## 2019-05-29 DIAGNOSIS — I340 Nonrheumatic mitral (valve) insufficiency: Secondary | ICD-10-CM

## 2019-05-29 DIAGNOSIS — I341 Nonrheumatic mitral (valve) prolapse: Secondary | ICD-10-CM | POA: Diagnosis not present

## 2019-05-29 NOTE — Progress Notes (Signed)
[address]       [place] [address]             [phone]     CARDIOTHORACIC SURGERY CONSULTATION REPORT  Primary Cardiologist is Will Meredith Leeds, MD PCP is [name], MD  Chief Complaint  Patient presents with   Mitral Regurgitation    CATH/TEE 05/26/19    HPI:  Patient is 73 year old moderately obese female with history of mitral valve prolapse, mitral regurgitation, and palpitations who has been referred for surgical consultation to discuss treatment options for management of severe symptomatic primary mitral regurgitation.  Patient states that she has been told that she had mitral valve prolapse for many years.  Several other family members have had mitral valve prolapse and valvular heart disease.  She has been followed for the last several years by Dr. Curt Bears because of history of palpitations.  Previous echocardiograms have documented the presence of normal left ventricular systolic function with mitral valve prolapse and moderate to severe mitral regurgitation.  Long-term event monitor in 2019 revealed sinus rhythm with sinus tachycardia and rare PVCs as well as 8 brief runs of supraventricular tachycardia.  Over the past year the patient has noticed increasing symptoms of exertional shortness of breath, particularly since August of this year.  Transesophageal echocardiogram performed May 26, 2019 revealed normal left ventricular systolic function with mitral valve prolapse including an obvious flail segment of the middle scallop of the posterior leaflet and severe prolapse of the P3 segment of the posterior leaflet causing severe mitral regurgitation.  There was additionally moderate to severe tricuspid regurgitation.  Left and right heart catheterization was performed revealing normal coronary artery anatomy and no significant coronary artery disease.  There was mild pulmonary hypertension with elevated pulmonary capillary wedge pressure  consistent with severe mitral regurgitation.  Cardiothoracic surgical consultation was requested.  Patient is widowed and lives locally in [place] by herself.  She has been retired for several years having previously worked in [employer].  She enjoys playing the piano and painting.  She lives a fairly sedentary lifestyle.  She complains of exertional shortness of breath that she initially noticed last January but has become much more prominent over the past several months.  She was started on Singulair and Breo Ellipta and noticed some improvement in her breathing at rest but she still gets short of breath with exertion.  She denies resting shortness of breath, PND, orthopnea, or lower extremity edema.  She has never had any significant chest pain or chest tightness.  She reports occasional palpitations without dizzy spells or syncope.  Past Medical History:  Diagnosis Date   ADD (attention deficit disorder)    Arthritis    "right knee" (04/19/2013)   Claustrophobia    Depression    Fracture of right tibial plateau 05/2016   GERD (gastroesophageal reflux disease)    Heart murmur    Migraine    "bad when I was in 5th or 6th grade; I can usually ward them off by resting my head now" (04/19/2013)   MVP (mitral valve prolapse)    Sleep apnea    has not used mouth guard in 5 years no cpap used    Squamous carcinoma ?1999   "upper left groin" (04/19/2013)    Past Surgical History:  Procedure Laterality Date   CHOLECYSTECTOMY  04/18/2013   CHOLECYSTECTOMY N/A 04/18/2013   Procedure: LAPAROSCOPIC CHOLECYSTECTOMY WITH INTRAOPERATIVE CHOLANGIOGRAM;  Surgeon: Ralene Ok, MD;  Location: [facility];  Service: General;  Laterality: N/A;   COLONOSCOPY W/ POLYPECTOMY     EYE SURGERY Bilateral FALL  2016   IOC FOR CATARACTS   KNEE ARTHROSCOPY Right 12/2007   "meniscus repair" (04/19/2013)   RETINAL TEAR REPAIR CRYOTHERAPY Left ~ 1997   RIGHT/LEFT HEART CATH AND CORONARY  ANGIOGRAPHY N/A 05/26/2019   Procedure: RIGHT/LEFT HEART CATH AND CORONARY ANGIOGRAPHY;  Surgeon: Jettie Booze, MD;  Location: [facility];  Service: Cardiovascular;  Laterality: N/A;   TEE WITHOUT CARDIOVERSION N/A 05/26/2019   Procedure: TRANSESOPHAGEAL ECHOCARDIOGRAM (TEE);  Surgeon: Josue Hector, MD;  Location: [facility];  Service: Cardiovascular;  Laterality: N/A;   TONSILLECTOMY  ~ [place] Right 09/21/2016   Procedure: RIGHT TOTAL KNEE ARTHROPLASTY;  Surgeon: Gaynelle Arabian, MD;  Location: [facility];  Service: Orthopedics;  Laterality: Right;  requests 69mins with abductor block   VARICOSE VEIN SURGERY Right 2000's   WRIST SURGERY Right 1975   "had a knot taken off" (04/19/2013)    Family History  Problem Relation Age of Onset   Tremor Mother    Osteoporosis Mother    Sarcoidosis Mother    Hypertension Mother    Congestive Heart Failure Father    Heart attack Father    Diabetes Mellitus II Father    Diabetes Mellitus II Brother     Social History   Socioeconomic History   Marital status: Widowed    Spouse name: Not on file   Number of children: Not on file   Years of education: Not on file   Highest education level: Not on file  Occupational History   Not on file  Social Needs   Financial resource strain: Not on file   Food insecurity    Worry: Not on file    Inability: Not on file   Transportation needs    Medical: Not on file    Non-medical: Not on file  Tobacco Use   Smoking status: Former Smoker    Packs/day: 1.50    Years: 20.00    Pack years: 30.00    Types: Cigarettes    Quit date: 11/30/1986    Years since quitting: 32.5   Smokeless tobacco: Never Used  Substance and Sexual Activity   Alcohol use: Yes    Comment: RARE   Drug use: No   Sexual activity: Never  Lifestyle   Physical activity    Days per week: Not on file    Minutes per session: Not on file   Stress: Not on file    Relationships   Social connections    Talks on phone: Not on file    Gets together: Not on file    Attends religious service: Not on file    Active member of club or organization: Not on file    Attends meetings of clubs or organizations: Not on file    Relationship status: Not on file   Intimate partner violence    Fear of current or ex partner: Not on file    Emotionally abused: Not on file    Physically abused: Not on file    Forced sexual activity: Not on file  Other Topics Concern   Not on file  Social History Narrative   Not on file    Current Outpatient Medications  Medication Sig Dispense Refill   acetaminophen (TYLENOL) 500 MG tablet Take 500-1,000 mg by mouth every 6 (six) hours as needed for moderate pain (depends  on pain if takes 1-2 tablets).     albuterol (PROVENTIL) (2.5 MG/3ML) 0.083% nebulizer solution Take 2.5 mg by nebulization every 4 (four) hours as needed for shortness of breath.      albuterol (VENTOLIN HFA) 108 (90 Base) MCG/ACT inhaler Inhale 1-2 puffs into the lungs every 4 (four) hours as needed for wheezing or shortness of breath. PRO AIR     aspirin 81 MG tablet Take 81 mg by mouth daily.     buPROPion (WELLBUTRIN XL) 300 MG 24 hr tablet Take 300 mg by mouth daily.     Ca Phosphate-Cholecalciferol (CALCIUM/VITAMIN D3 GUMMIES PO) Take 2 each by mouth 2 (two) times daily.     fluticasone furoate-vilanterol (BREO ELLIPTA) 100-25 MCG/INH AEPB USE 1 PUFF DAILY AS DIRECTED (Patient taking differently: Inhale 1 puff into the lungs daily. USE 1 PUFF DAILY AS DIRECTED) 60 each 3   montelukast (SINGULAIR) 10 MG tablet Take 10 mg by mouth at bedtime.      Multiple Vitamins-Minerals (MULTIVITAMIN WITH MINERALS) tablet Take 1 tablet by mouth daily.     Vitamin D, Ergocalciferol, (DRISDOL) 1.25 MG (50000 UT) CAPS capsule Take 50,000 Units by mouth 2 (two) times a week. Nancy Fetter and Thurs     No current facility-administered medications for this visit.      Allergies  Allergen Reactions   Penicillins Rash    Has patient had a PCN reaction causing immediate rash, facial/tongue/throat swelling, SOB or lightheadedness with hypotension: unknown Has patient had a PCN reaction causing severe rash involving mucus membranes or skin necrosis: no Has patient had a PCN reaction that required hospitalization: unknown Has patient had a PCN reaction occurring within the last 10 years: no If all of the above answers are "NO", then may proceed with Cephalosporin use.    Codeine Other (See Comments)    Possible sensitivity, patient not sure   Milk-Related Compounds Nausea And Vomiting and Other (See Comments)    Can have ice cream   Morphine And Related     "strange sensation across midsection and chest"      Review of Systems:   General:  normal appetite, normal energy, no weight gain, no weight loss, no fever  Cardiac:  no chest pain with exertion, no chest pain at rest, +SOB with exertion, no resting SOB, no PND, no orthopnea, + palpitations, no arrhythmia, no atrial fibrillation, no LE edema, no dizzy spells, no syncope  Respiratory:  + exertional shortness of breath, no home oxygen, no productive cough, no dry cough, no bronchitis, no wheezing, no hemoptysis, no asthma, no pain with inspiration or cough, + sleep apnea, no CPAP at night  GI:   no difficulty swallowing, + reflux, no frequent heartburn, no hiatal hernia, no abdominal pain, no constipation, no diarrhea, no hematochezia, no hematemesis, no melena  GU:   no dysuria,  no frequency, no urinary tract infection, no hematuria, no enlarged prostate, no kidney stones, + stage III kidney disease  Vascular:  no pain suggestive of claudication, no pain in feet, no leg cramps, no varicose veins, no DVT, no non-healing foot ulcer  Neuro:   no stroke, no TIA's, no seizures, no headaches, no temporary blindness one eye,  no slurred speech, no peripheral neuropathy, no chronic pain, no instability  of gait, no memory/cognitive dysfunction  Musculoskeletal: + arthritis, no joint swelling, no myalgias, no difficulty walking, normal mobility   Skin:   no rash, no itching, no skin infections, no pressure sores or ulcerations  Psych:   no anxiety, + depression, no nervousness, no unusual recent stress  Eyes:   no blurry vision, + floaters, no recent vision changes, + wears glasses or contacts  ENT:   + hearing loss, no loose or painful teeth, no dentures, last saw dentist December 2019  Hematologic:  no easy bruising, no abnormal bleeding, no clotting disorder, no frequent epistaxis  Endocrine:  no diabetes, does not check CBG's at home     Physical Exam:   BP 110/73 (BP Location: Left Arm, Patient Position: Sitting, Cuff Size: Normal)    Pulse 86    Temp (!) 96.4 F (35.8 C)    Resp 16    Ht 5' 2.75" (1.594 m)    Wt 170 lb (77.1 kg)    SpO2 97% Comment: RA   BMI 30.35 kg/m   General:  Moderately obese,  well-appearing  HEENT:  Unremarkable   Neck:   no JVD, no bruits, no adenopathy   Chest:   clear to auscultation, symmetrical breath sounds, no wheezes, no rhonchi   CV:   RRR, grade IV/VI holosystolic murmur   Abdomen:  soft, non-tender, no masses   Extremities:  warm, well-perfused, pulses diminished, no LE edema  Rectal/GU  Deferred  Neuro:   Grossly non-focal and symmetrical throughout  Skin:   Clean and dry, no rashes, no breakdown   Diagnostic Tests:    ECHOCARDIOGRAM REPORT       Patient Name:   MIKE BERNTSEN Date of Exam: 08/29/2018 Medical Rec #:  [id number]     Height:       62.0 in Accession #:    [id number]    Weight:       162.0 lb Date of Birth:  1945-07-31      BSA:          1.75 m Patient Age:    72 years      BP:           126/70 mmHg Patient Gender: F             HR:           88 bpm. Exam Location:  [address]    Procedure: 2D Echo, 3D Echo, Cardiac Doppler and Color Doppler  Indications:    I34.1 MVP   History:        Patient has prior history of  Echocardiogram examinations, most                 recent 09/11/2016. Mitral Valve Prolapse; Signs/Symptoms: Murmur,                 Syncope and Dyspnea; Risk Factors: Family History of Coronary                 Artery Disease and Former Smoker. Palpitaitons, Dizziness,                 Migraines, Obesity, Obstructive Sleep Apnea.   Sonographer:    Deliah Boston RDCS Referring Phys: [id number] WILL MARTIN [place]    1. The left ventricle has hyperdynamic systolic function of >09%. The cavity size was normal. There is no increased left ventricular wall thickness. Echo evidence of pseudonormalization in diastolic relaxation Elevated mean left atrial pressure.  2. Left atrial size was severely dilated.  3. Severe mitral valve prolapse, posterior> anterior leaflet.  4. The mitral valve is myxomatous. There is moderate thickening of the anterior and posterior leaflets.  5. Mitral valve  regurgitation is severe. The MR jet is eccentric anteriorly and medially directed. The estimated effective regurgitant orifice area is 0.7 cm sq. by the PISA method. The regurgitant volume is 100 mL, regurgitant fraction is 60%. There is  flow reversal in the pulmonary veins.  6. Right ventricular systolic pressure is mildly elevated with an estimated pressure of 37 mmHg.  7. Aortic valve regurgitation is mild.  FINDINGS  Left Ventricle: The left ventricle has hyperdynamic systolic function of >30%. The cavity size was normal. There is no increased left ventricular wall thickness. Echo evidence of pseudonormalization in diastolic relaxation Elevated mean left atrial  pressure No evidence of left ventricular regional wall motion abnormalities.. Right Ventricle: The right ventricle has normal systolic function. The cavity was normal. There is no increase in right ventricular wall thickness. Right ventricular systolic pressure is mildly elevated with an estimated pressure of 37.1 mmHg. Left Atrium: left  atrial size was severely dilated Right Atrium: right atrial size was normal in size Interatrial Septum: No atrial level shunt detected by color flow Doppler.  Pericardium: There is no evidence of pericardial effusion. Mitral Valve: The mitral valve is myxomatous. There is moderate thickening of the anterior and posterior leaflets. Mitral valve regurgitation is severe by color flow Doppler. The MR jet is eccentric medially directed. There is severe late systolic  prolapse of of both mitral leaflets of the mitral valve. Tricuspid Valve: The tricuspid valve is not well seen. The tricuspid valve is normal in structure. Tricuspid valve regurgitation is mild by color flow Doppler. Aortic Valve: The aortic valve is normal in structure. Aortic valve regurgitation is mild by color flow Doppler. Pulmonic Valve: The pulmonic valve was normal in structure. Pulmonic valve regurgitation is not visualized by color flow Doppler.   LEFT VENTRICLE PLAX 2D (Teich) LV EF:          61.7 %   Diastology LVIDd:          4.82 cm  LV e' lateral:   18.30 cm/s LVIDs:          3.22 cm  LV E/e' lateral: 8.0 LV PW:          1.07 cm  LV e' medial:    9.36 cm/s LV IVS:         0.78 cm  LV E/e' medial:  15.7 LVOT diam:      1.90 cm LV SV:          67 ml LVOT Area:      2.84 cm  RIGHT VENTRICLE RV S prime:     19.60 cm/s TAPSE (M-mode): 3.1 cm RVSP:           37.1 mmHg  LEFT ATRIUM              Index        RIGHT ATRIUM           Index LA diam:        4.75 cm  2.72 cm/m   RA Pressure: 3 mmHg LA Vol (A2C):   177.0 ml 101.26 ml/m RA Area:     17.70 cm LA Vol (A4C):   93.7 ml  53.61 ml/m  RA Volume:   46.70 ml  26.72 ml/m LA Biplane Vol: 144.0 ml 82.38 ml/m  AORTIC VALVE LVOT Vmax:   115.00 cm/s LVOT Vmean:  58.200 cm/s LVOT VTI:    0.188 m AR PHT:      481 msec   AORTA Ao Root diam: 3.00  cm Ao Asc diam:  2.80 cm  MITRAL VALVE               TR Peak grad: 34.1 mmHg MV Area (PHT): cm         TR Vmax:       352.00 cm/s MV Peak grad:  9.0 mmHg    RVSP:         37.1 mmHg MV Mean grad:  2.0 mmHg MV Vmax:       1.50 m/s MV Vmean:      65.6 cm/s MV VTI:        0.30 m MV PHT:        msec MV Decel Time: 187 msec MR Peak grad: 104.0 mmHg MR Mean grad: 57.0 mmHg MR Vmax:      510.00 cm/s MR Vmean:     338.0 cm/s MR PISA:      7.55 MV E velocity: 147.00 cm/s MV A velocity: 73.30 cm/s MV E/A ratio:  2.01    Mihai Croitoru MD Electronically signed by Sanda Klein MD Signature Date/Time: 08/29/2018/5:07:38 PM       TRANSESOPHOGEAL ECHO REPORT       Patient Name:   Theador Hawthorne Date of Exam: 05/26/2019 Medical Rec #:  [id number]     Height:       62.5 in Accession #:    [id number]    Weight:       171.0 lb Date of Birth:  [birth year]      BSA:          1.80 m Patient Age:    2 years      BP:           131/64 mmHg Patient Gender: F             HR:           76 bpm. Exam Location:  Inpatient    Procedure: Transesophageal Echo  Indications:    Mitral regurgitation   History:        Patient has prior history of Echocardiogram examinations, most                 recent 08/29/2018. Mitral Valve Disease Risk Factors:Sleep Apnea                 and Former Smoker.   Sonographer:    Clayton Lefort RDCS (AE) Referring Phys: [id number] [place]: The transesophogeal probe was passed through the esophogus of the patient. The patient developed no complications during the procedure.  IMPRESSIONS    1. Left ventricular ejection fraction, by visual estimation, is 60 to 65%. The left ventricle has normal function. Normal left ventricular size. Left ventricular septal wall thickness was normal. Normal left ventricular posterior wall thickness. There  is no left ventricular hypertrophy.  2. Global right ventricle has normal systolic function.The right ventricular size is mildly enlarged. No increase in right ventricular wall thickness.  3. Left atrial size was  severely dilated.  4. Right atrial size was mildly dilated.  5. The mitral valve is myxomatous. Severe mitral valve regurgitation.  6. Myxomatous valve with flail P2 segment and prolapse of P3 as well Severe eccentric anteriorly directed MR.  7. The tricuspid valve is abnormal. Tricuspid valve regurgitation moderate-severe.  8. The aortic valve is tricuspid. Aortic valve regurgitation is mild.  9. The pulmonic valve was normal in structure. Pulmonic valve regurgitation is mild. 10. Moderately elevated pulmonary  artery systolic pressure. 11. Evidence of atrial level shunting detected by color flow Doppler.  FINDINGS  Left Ventricle: Left ventricular ejection fraction, by visual estimation, is 60 to 65%. The left ventricle has normal function. Left ventricular septal wall thickness was normal. Normal left ventricular posterior wall thickness. There is no left  ventricular hypertrophy. Normal left ventricular size.  Right Ventricle: The right ventricular size is mildly enlarged. No increase in right ventricular wall thickness. Global RV systolic function is has normal systolic function. The tricuspid regurgitant velocity is 3.79 m/s, and with an assumed right atrial  pressure of 10 mmHg, the estimated right ventricular systolic pressure is moderately elevated at 67.5 mmHg.  Left Atrium: Left atrial size was severely dilated.  Right Atrium: Right atrial size was mildly dilated  Pericardium: There is no evidence of pericardial effusion.  Mitral Valve: The mitral valve is myxomatous. Severe mitral valve regurgitation. Myxomatous valve with flail P2 segment and prolapse of P3 as well Severe eccentric anteriorly directed MR.  Tricuspid Valve: The tricuspid valve is abnormal. Tricuspid valve regurgitation moderate-severe.  Aortic Valve: The aortic valve is tricuspid. Aortic valve regurgitation is mild.  Pulmonic Valve: The pulmonic valve was normal in structure. Pulmonic valve  regurgitation is mild.  Aorta: The aortic root is normal in size and structure.  Shunts: Evidence of atrial level shunting detected by color flow Doppler.    MR Peak grad:    68.9 mmHg   TRICUSPID VALVE             Normals MR Mean grad:    41.0 mmHg   TR Peak grad:   57.5 mmHg MR Vmax:         415.00 cm/s TR Vmax:        379.00 cm/s 288 cm/s MR Vmean:        300.0 cm/s MR PISA:         20.36 cm MR PISA Eff ROA: 142 mm MR PISA Radius:  1.80 cm    Jenkins Rouge MD Electronically signed by Jenkins Rouge MD Signature Date/Time: 05/26/2019/11:01:47 AM    RIGHT/LEFT HEART CATH AND CORONARY ANGIOGRAPHY  Conclusion    No angiographically apparent coronary artery disease.  The left ventricular ejection fraction is greater than 65% by visual estimate.  The left ventricular systolic function is normal.  LV end diastolic pressure is normal.  There is severe (4+) mitral regurgitation.  There is no aortic valve stenosis.  Hemodynamic findings consistent with mild pulmonary hypertension.  Ao sat 99%, PA sat 75%, mean PA pressure 26 mm Hg; mean PCWP 13 mm Hg;  Aortogram showed no AAA and no renal artery stenosis. Widely patent iliac arteries bilaterally.   Proceed with MV repair w/u.  Discharge from short stay later today.   Recommendations  Discharge Date In the absence of any other complications or medical issues, we expect the patient to be ready for discharge from a cath perspective on 05/26/2019.  Surgeon Notes    05/26/2019 10:57 AM CV Procedure signed by Josue Hector, MD  Indications  Severe mitral regurgitation [I34.0 (ICD-10-CM)]  Procedural Details  Technical Details The risks, benefits, and details of the procedure were explained to the patient.  The patient verbalized understanding and wanted to proceed.  Informed written consent was obtained.  PROCEDURE TECHNIQUE:  After Xylocaine anesthesia, a 5 French sheath was placed in the right antecubital area in  exchange for a the peripheral IV. A 5 French balloontipped Swan-Ganz catheter was advanced to the  pulmonary artery under fluoroscopic guidance. Hemodynamic pressures were obtained. Oxygen saturations were obtained. After Xylocaine anesthesia, a 61F sheath was placed in the right radial artery with a single anterior needle wall stick.   Left coronary angiography was done using a Judkins L3.5 guide catheter.  Right coronary angiography and Left heart cath were done using a Judkins R4 guide catheter. Abdominal aortogram was done using a pigtail catheter and power injection of contrast.  Aortogram showed no AAA and no renal artery stenosis. Widely patent iliac arteries.      Contrast: 80 cc  Estimated blood loss <50 mL.   During this procedure medications were administered to achieve and maintain moderate conscious sedation while the patient's heart rate, blood pressure, and oxygen saturation were continuously monitored and I was present face-to-face 100% of this time.   Sedation Time  Sedation Time Physician-1: 30 minutes 48 seconds  Contrast  Medication Name Total Dose  iohexol (OMNIPAQUE) 350 MG/ML injection 80 mL    Radiation/Fluoro  Fluoro time: 3.4 (min) DAP: 11927 (mGycm2) Cumulative Air Kerma: 190 (mGy)  Coronary Findings  Diagnostic Dominance: Right Left Main  Vessel was injected. Vessel is normal in caliber. Vessel is angiographically normal.  Left Anterior Descending  Vessel was injected. Vessel is normal in caliber. Vessel is angiographically normal.  Left Circumflex  Vessel was injected. Vessel is normal in caliber. Vessel is angiographically normal.  Right Coronary Artery  Vessel was injected. Vessel is normal in caliber. Vessel is angiographically normal.  Intervention  No interventions have been documented. Right Heart  Right Heart Pressures Hemodynamic findings consistent with mild pulmonary hypertension. Ao sat 99%, PA sat 75%, mean PA pressure 26 mm Hg; mean PCWP  13 mm Hg;  Wall Motion  Resting               Left Heart  Left Ventricle The left ventricular size is normal. The left ventricular systolic function is normal. LV end diastolic pressure is normal. The left ventricular ejection fraction is greater than 65% by visual estimate. No regional wall motion abnormalities. There is severe (4+) mitral regurgitation.  Aortic Valve There is no aortic valve stenosis.  Coronary Diagrams  Diagnostic Dominance: Right  Intervention  Implants   No implant documentation for this case.  Syngo Images  Show images for CARDIAC CATHETERIZATION  Images on Long Term Storage  Show images for [place], [place] to Procedure Log  Procedure Log    Hemo Data   Most Recent Value  Fick Cardiac Output 5.47 L/min  Fick Cardiac Output Index 3.02 (L/min)/BSA  RA A Wave 8 mmHg  RA V Wave 4 mmHg  RA Mean 3 mmHg  RV Systolic Pressure 35 mmHg  RV Diastolic Pressure -1 mmHg  RV EDP 2 mmHg  PA Systolic Pressure 40 mmHg  PA Diastolic Pressure 12 mmHg  PA Mean 23 mmHg  PW A Wave 14 mmHg  PW V Wave 25 mmHg  PW Mean 13 mmHg  AO Systolic Pressure 102 mmHg  AO Diastolic Pressure 53 mmHg  AO Mean 73 mmHg  LV Systolic Pressure 94 mmHg  LV Diastolic Pressure 2 mmHg  LV EDP 8 mmHg  AOp Systolic Pressure 96 mmHg  AOp Diastolic Pressure 57 mmHg  AOp Mean Pressure 74 mmHg  LVp Systolic Pressure 96 mmHg  LVp Diastolic Pressure 5 mmHg  LVp EDP Pressure 10 mmHg  QP/QS 1  TPVR Index 7.63 HRUI  TSVR Index 24.2 HRUI  PVR SVR Ratio 0.14  TPVR/TSVR Ratio 0.32     Impression:  Patient has mitral valve prolapse with stage D severe symptomatic primary mitral regurgitation.  She describes gradual progression of symptoms of exertional shortness of breath consistent with chronic diastolic congestive heart failure, New York Heart Association functional class II.  I have personally reviewed the patient's recent transesophageal echocardiogram and diagnostic  cardiac catheterization.  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  They are ruptured primary chordae tendinae to the P2 segment causing a flail leaflet with severe mitral regurgitation.  Left ventricular systolic function remains normal.  There is severe left atrial enlargement.  There is significant tricuspid regurgitation.  I agree the patient would benefit from elective mitral valve repair.  Risks associated with surgery should be relatively low based upon appearance of TEE feel there is greater than 95% likelihood of successful valve repair.   Plan:  The patient and her close friend were counseled at length regarding the indications, risks and potential benefits of mitral valve repair.  The rationale for elective surgery has been explained, including a comparison between surgery and continued medical therapy with close follow-up.  The likelihood of successful and durable mitral valve repair has been discussed with particular reference to the findings of their recent echocardiogram.  Based upon these findings and previous experience, I have quoted them a greater than 95% percent likelihood of successful valve repair with less than 1% percent risk of mortality or major morbidity.  Alternative surgical approaches have been discussed including a comparison between conventional sternotomy and minimally-invasive techniques.  The relative risks and benefits of each have been reviewed as they pertain to the patient's specific circumstances, and expectations for the patient's postoperative convalescence has been discussed.  The patient desires to proceed with surgery after Thanksgiving holiday.  We tentatively plan to proceed on June 20, 2019.  Patient will undergo CT angiography to evaluate the feasibility of peripheral cannulation for surgery.  She will return to our office prior to surgery on  July 19, 2019.  All of her questions have been answered.     I spent in excess of 90 minutes during the conduct of this office consultation and >50% of this time involved direct face-to-face encounter with the patient for counseling and/or coordination of their care.   Valentina Gu. Roxy Manns, MD 05/29/2019 11:55 AM

## 2019-05-29 NOTE — Patient Instructions (Signed)
   Continue taking all current medications without change through the day before surgery.  Have nothing to eat or drink after midnight the night before surgery.  On the morning of surgery do not take any medications

## 2019-06-06 ENCOUNTER — Other Ambulatory Visit: Payer: Self-pay

## 2019-06-06 ENCOUNTER — Ambulatory Visit
Admission: RE | Admit: 2019-06-06 | Discharge: 2019-06-06 | Disposition: A | Discharge status: Home / Self Care | Payer: PPO | Source: Ambulatory Visit | Attending: Thoracic Surgery (Cardiothoracic Vascular Surgery) | Admitting: Thoracic Surgery (Cardiothoracic Vascular Surgery)

## 2019-06-06 DIAGNOSIS — Z01818 Encounter for other preprocedural examination: Secondary | ICD-10-CM | POA: Diagnosis not present

## 2019-06-06 DIAGNOSIS — R911 Solitary pulmonary nodule: Secondary | ICD-10-CM

## 2019-06-06 DIAGNOSIS — R0602 Shortness of breath: Secondary | ICD-10-CM | POA: Diagnosis not present

## 2019-06-06 DIAGNOSIS — I340 Nonrheumatic mitral (valve) insufficiency: Secondary | ICD-10-CM

## 2019-06-06 DIAGNOSIS — K5730 Diverticulosis of large intestine without perforation or abscess without bleeding: Secondary | ICD-10-CM | POA: Diagnosis not present

## 2019-06-06 HISTORY — DX: Solitary pulmonary nodule: R91.1

## 2019-06-06 MED ORDER — IOPAMIDOL (ISOVUE-370) INJECTION 76%
75.0000 mL | Freq: Once | INTRAVENOUS | Status: AC | PRN
  Administered 2019-06-06: 75 mL via INTRAVENOUS

## 2019-06-08 DIAGNOSIS — N281 Cyst of kidney, acquired: Secondary | ICD-10-CM | POA: Diagnosis not present

## 2019-06-12 ENCOUNTER — Encounter: Payer: Self-pay | Admitting: Thoracic Surgery (Cardiothoracic Vascular Surgery)

## 2019-06-12 ENCOUNTER — Telehealth: Payer: Self-pay | Admitting: Thoracic Surgery (Cardiothoracic Vascular Surgery)

## 2019-06-12 NOTE — Telephone Encounter (Signed)
Called and spoke about results of CT scan of chest which revealed opacity in the left lower lobe consistent with either inflammatory process or possible slow-growing malignancy.  Patient specifically denies any fevers, chills, productive cough, or hemoptysis.  She describes stable symptoms of exertional shortness of breath without significant congestion.  She has not had any pleuritic chest pain.  She does have a remote history of heavy tobacco use in the distant past.  Under the circumstances we discussed options regarding her scheduled upcoming surgery.  We will review routine preoperative chest radiograph and blood work, and presuming there are no signs of infection we will plan to proceed with surgery.  The patient will need follow-up CT scan of the chest in approximately 3 months and further work-up at that time depending upon results.  Rexene Alberts, MD 06/12/2019 11:25 AM

## 2019-06-14 ENCOUNTER — Other Ambulatory Visit: Payer: Self-pay | Admitting: Thoracic Surgery (Cardiothoracic Vascular Surgery)

## 2019-06-14 DIAGNOSIS — R911 Solitary pulmonary nodule: Secondary | ICD-10-CM

## 2019-06-16 ENCOUNTER — Ambulatory Visit (HOSPITAL_COMMUNITY): Payer: PPO

## 2019-06-16 ENCOUNTER — Inpatient Hospital Stay (HOSPITAL_COMMUNITY): Admission: RE | Admit: 2019-06-16 | Payer: PPO | Source: Ambulatory Visit

## 2019-06-16 ENCOUNTER — Other Ambulatory Visit (HOSPITAL_COMMUNITY): Payer: PPO

## 2019-06-19 ENCOUNTER — Ambulatory Visit: Payer: PPO | Admitting: Thoracic Surgery (Cardiothoracic Vascular Surgery)

## 2019-06-22 ENCOUNTER — Ambulatory Visit: Payer: PPO | Admitting: Pulmonary Disease

## 2019-06-30 ENCOUNTER — Ambulatory Visit: Payer: PPO

## 2019-07-19 ENCOUNTER — Other Ambulatory Visit: Payer: Self-pay | Admitting: Thoracic Surgery (Cardiothoracic Vascular Surgery)

## 2019-07-19 DIAGNOSIS — Z01818 Encounter for other preprocedural examination: Secondary | ICD-10-CM

## 2019-07-20 ENCOUNTER — Ambulatory Visit
Admission: RE | Admit: 2019-07-20 | Discharge: 2019-07-20 | Disposition: A | Discharge status: Home / Self Care | Payer: PPO | Source: Ambulatory Visit | Attending: Thoracic Surgery (Cardiothoracic Vascular Surgery) | Admitting: Thoracic Surgery (Cardiothoracic Vascular Surgery)

## 2019-07-20 DIAGNOSIS — R918 Other nonspecific abnormal finding of lung field: Secondary | ICD-10-CM | POA: Diagnosis not present

## 2019-07-20 DIAGNOSIS — R911 Solitary pulmonary nodule: Secondary | ICD-10-CM

## 2019-07-20 LAB — CREATININE, SERUM: Creat: 1.05 mg/dL — ABNORMAL HIGH (ref 0.60–0.93)

## 2019-07-20 MED ORDER — IOPAMIDOL (ISOVUE-300) INJECTION 61%
75.0000 mL | Freq: Once | INTRAVENOUS | Status: AC | PRN
  Administered 2019-07-20: 75 mL via INTRAVENOUS

## 2019-07-20 NOTE — Progress Notes (Signed)
[place], [place] [address] Phone: [phone] Fax: [phone]      Your procedure is scheduled on Wednesday 07/26/2019.  Report to [facility] Main Entrance "A" at [phone] A.M., and check in at the Admitting office.  Call this number if you have problems the morning of surgery:  [phone]  Call [phone] if you have any questions prior to your surgery date Monday-Friday 8am-4pm    Remember:  Do not eat or drink after midnight the night before your surgery     Take these medicines the morning of surgery with A SIP OF : Acetaminophen (Tylenol) - if needed Albuterol (PRoventil) nebulizer - if needed Albuterol (Ventolin) inhaler - if needed Bupropion (Wellbutrin XL) Fluticasone Furoate-vilanterol (Breo Ellipta)  7 days prior to surgery STOP taking any Aspirin (unless otherwise instructed by your surgeon), Aleve, Naproxen, Ibuprofen, Motrin, Advil, Goody's, BC's, all herbal medications, fish oil, and all vitamins.  Follow your surgeon's instructions on when to stop Aspirin.  If no instructions were given by your surgeon then you will need to call the office to get those instructions.    Please bring all inhalers with you the day of surgery.     The Morning of Surgery  Do not wear jewelry, make-up or nail polish.  Do not wear lotions, powders, perfumes, or deodorant  Do not shave 48 hours prior to surgery.   Do not bring valuables to the hospital.  [facility] is not responsible for any belongings or valuables.  If you are a smoker, DO NOT Smoke 24 hours prior to surgery  If you wear a CPAP at night please bring your mask, tubing, and machine the morning of surgery   Remember that you must have someone to transport you home after your surgery, and remain with you for 24 hours if you are discharged the same day.   Please bring cases for contacts, glasses, hearing aids, dentures or  bridgework because it cannot be worn into surgery.    Leave your suitcase in the car.  After surgery it may be brought to your room.  For patients admitted to the hospital, discharge time will be determined by your treatment team.  Patients discharged the day of surgery will not be allowed to drive home.    Special instructions:   [facility]- Preparing For Surgery  Before surgery, you can play an important role. Because skin is not sterile, your skin needs to be as free of germs as possible. You can reduce the number of germs on your skin by washing with CHG (chlorahexidine gluconate) Soap before surgery.  CHG is an antiseptic cleaner which kills germs and bonds with the skin to continue killing germs even after washing.    Oral Hygiene is also important to reduce your risk of infection.  Remember - BRUSH YOUR TEETH THE MORNING OF SURGERY WITH YOUR REGULAR TOOTHPASTE  Please do not use if you have an allergy to CHG or antibacterial soaps. If your skin becomes reddened/irritated stop using the CHG.  Do not shave (including legs and underarms) for at least 48 hours prior to first CHG shower. It is OK to shave your face.  Please follow these instructions carefully.   1. Shower the NIGHT BEFORE SURGERY and the MORNING OF SURGERY with CHG Soap.   2. If you chose to wash your hair, wash your hair first as usual with your normal shampoo.  3.  After you shampoo, rinse your hair and body thoroughly to remove the shampoo.  4. Use CHG as you would any other liquid soap. You can apply CHG directly to the skin and wash gently with a scrungie or a clean washcloth.   5. Apply the CHG Soap to your body ONLY FROM THE NECK DOWN.  Do not use on open wounds or open sores. Avoid contact with your eyes, ears, mouth and genitals (private parts). Wash Face and genitals (private parts)  with your normal soap.   6. Wash thoroughly, paying special attention to the area where your surgery will be  performed.  7. Thoroughly rinse your body with warm water from the neck down.  8. DO NOT shower/wash with your normal soap after using and rinsing off the CHG Soap.  9. Pat yourself dry with a CLEAN TOWEL.  10. Wear CLEAN PAJAMAS to bed the night before surgery, wear comfortable clothes the morning of surgery  11. Place CLEAN SHEETS on your bed the night of your first shower and DO NOT SLEEP WITH PETS.    Day of Surgery:  Please shower the morning of surgery with the CHG soap Do not apply any deodorants/lotions. Please wear clean clothes to the hospital/surgery center.   Remember to brush your teeth WITH YOUR REGULAR TOOTHPASTE.   Please read over the following fact sheets that you were given.

## 2019-07-23 ENCOUNTER — Encounter: Payer: Self-pay | Admitting: Thoracic Surgery (Cardiothoracic Vascular Surgery)

## 2019-07-24 ENCOUNTER — Other Ambulatory Visit: Payer: Self-pay

## 2019-07-24 ENCOUNTER — Ambulatory Visit (INDEPENDENT_AMBULATORY_CARE_PROVIDER_SITE_OTHER): Payer: PPO | Admitting: Thoracic Surgery (Cardiothoracic Vascular Surgery)

## 2019-07-24 ENCOUNTER — Ambulatory Visit (HOSPITAL_COMMUNITY)
Admission: RE | Admit: 2019-07-24 | Discharge: 2019-07-24 | Disposition: A | Discharge status: Home / Self Care | Payer: PPO | Source: Ambulatory Visit | Attending: Thoracic Surgery (Cardiothoracic Vascular Surgery) | Admitting: Thoracic Surgery (Cardiothoracic Vascular Surgery)

## 2019-07-24 ENCOUNTER — Encounter (HOSPITAL_COMMUNITY)
Admission: RE | Admit: 2019-07-24 | Discharge: 2019-07-24 | Disposition: A | Discharge status: Home / Self Care | Payer: PPO | Source: Ambulatory Visit | Attending: Thoracic Surgery (Cardiothoracic Vascular Surgery) | Admitting: Thoracic Surgery (Cardiothoracic Vascular Surgery)

## 2019-07-24 ENCOUNTER — Encounter: Payer: Self-pay | Admitting: Thoracic Surgery (Cardiothoracic Vascular Surgery)

## 2019-07-24 ENCOUNTER — Telehealth: Payer: Self-pay

## 2019-07-24 ENCOUNTER — Encounter (HOSPITAL_COMMUNITY): Payer: Self-pay

## 2019-07-24 ENCOUNTER — Other Ambulatory Visit: Payer: Self-pay | Admitting: *Deleted

## 2019-07-24 ENCOUNTER — Other Ambulatory Visit (HOSPITAL_COMMUNITY)
Admission: RE | Admit: 2019-07-24 | Discharge: 2019-07-24 | Disposition: A | Discharge status: Home / Self Care | Payer: PPO | Source: Ambulatory Visit | Attending: Family Medicine | Admitting: Family Medicine

## 2019-07-24 VITALS — BP 118/76 | HR 95 | Temp 96.8°F | Resp 16 | Ht 62.5 in | Wt 171.4 lb

## 2019-07-24 DIAGNOSIS — I340 Nonrheumatic mitral (valve) insufficiency: Secondary | ICD-10-CM

## 2019-07-24 DIAGNOSIS — I361 Nonrheumatic tricuspid (valve) insufficiency: Secondary | ICD-10-CM

## 2019-07-24 DIAGNOSIS — Z79899 Other long term (current) drug therapy: Secondary | ICD-10-CM | POA: Insufficient documentation

## 2019-07-24 DIAGNOSIS — Z20822 Contact with and (suspected) exposure to covid-19: Secondary | ICD-10-CM | POA: Diagnosis not present

## 2019-07-24 DIAGNOSIS — Z01818 Encounter for other preprocedural examination: Secondary | ICD-10-CM | POA: Diagnosis not present

## 2019-07-24 DIAGNOSIS — R911 Solitary pulmonary nodule: Secondary | ICD-10-CM | POA: Diagnosis not present

## 2019-07-24 DIAGNOSIS — I341 Nonrheumatic mitral (valve) prolapse: Secondary | ICD-10-CM | POA: Diagnosis not present

## 2019-07-24 DIAGNOSIS — I071 Rheumatic tricuspid insufficiency: Secondary | ICD-10-CM | POA: Insufficient documentation

## 2019-07-24 DIAGNOSIS — I5032 Chronic diastolic (congestive) heart failure: Secondary | ICD-10-CM | POA: Insufficient documentation

## 2019-07-24 HISTORY — DX: Cardiac arrhythmia, unspecified: I49.9

## 2019-07-24 HISTORY — DX: Dyspnea, unspecified: R06.00

## 2019-07-24 LAB — URINALYSIS, ROUTINE W REFLEX MICROSCOPIC
Bilirubin Urine: NEGATIVE
Glucose, UA: NEGATIVE mg/dL
Hgb urine dipstick: NEGATIVE
Ketones, ur: NEGATIVE mg/dL
Leukocytes,Ua: NEGATIVE
Nitrite: NEGATIVE
Protein, ur: NEGATIVE mg/dL
Specific Gravity, Urine: 1.015 (ref 1.005–1.030)
pH: 7 (ref 5.0–8.0)

## 2019-07-24 LAB — BLOOD GAS, ARTERIAL
Acid-Base Excess: 0.7 mmol/L (ref 0.0–2.0)
Bicarbonate: 25.1 mmol/L (ref 20.0–28.0)
Drawn by: 30136
FIO2: 21
O2 Saturation: 96.6 %
Patient temperature: 37
pCO2 arterial: 42.6 mmHg (ref 32.0–48.0)
pH, Arterial: 7.388 (ref 7.350–7.450)
pO2, Arterial: 82 mmHg — ABNORMAL LOW (ref 83.0–108.0)

## 2019-07-24 LAB — COMPREHENSIVE METABOLIC PANEL
ALT: 16 U/L (ref 0–44)
AST: 22 U/L (ref 15–41)
Albumin: 4 g/dL (ref 3.5–5.0)
Alkaline Phosphatase: 47 U/L (ref 38–126)
Anion gap: 10 (ref 5–15)
BUN: 11 mg/dL (ref 8–23)
CO2: 22 mmol/L (ref 22–32)
Calcium: 9.2 mg/dL (ref 8.9–10.3)
Chloride: 108 mmol/L (ref 98–111)
Creatinine, Ser: 1.07 mg/dL — ABNORMAL HIGH (ref 0.44–1.00)
GFR calc Af Amer: 60 mL/min — ABNORMAL LOW (ref >60)
GFR calc non Af Amer: 51 mL/min — ABNORMAL LOW (ref >60)
Glucose, Bld: 102 mg/dL — ABNORMAL HIGH (ref 70–99)
Potassium: 4.4 mmol/L (ref 3.5–5.1)
Sodium: 140 mmol/L (ref 135–145)
Total Bilirubin: 0.9 mg/dL (ref 0.3–1.2)
Total Protein: 6.8 g/dL (ref 6.5–8.1)

## 2019-07-24 LAB — CBC
HCT: 45.2 % (ref 36.0–46.0)
Hemoglobin: 14.5 g/dL (ref 12.0–15.0)
MCH: 30 pg (ref 26.0–34.0)
MCHC: 32.1 g/dL (ref 30.0–36.0)
MCV: 93.6 fL (ref 80.0–100.0)
Platelets: 320 10*3/uL (ref 150–400)
RBC: 4.83 MIL/uL (ref 3.87–5.11)
RDW: 12.4 % (ref 11.5–15.5)
WBC: 6.6 10*3/uL (ref 4.0–10.5)
nRBC: 0 % (ref 0.0–0.2)

## 2019-07-24 LAB — ABO/RH: ABO/RH(D): O POS

## 2019-07-24 LAB — SURGICAL PCR SCREEN
MRSA, PCR: NEGATIVE
Staphylococcus aureus: NEGATIVE

## 2019-07-24 LAB — TYPE AND SCREEN
ABO/RH(D): O POS
Antibody Screen: NEGATIVE

## 2019-07-24 LAB — APTT: aPTT: 37 seconds — ABNORMAL HIGH (ref 24–36)

## 2019-07-24 LAB — PROTIME-INR
INR: 1 (ref 0.8–1.2)
Prothrombin Time: 13 seconds (ref 11.4–15.2)

## 2019-07-24 LAB — SARS CORONAVIRUS 2 (TAT 6-24 HRS): SARS Coronavirus 2: NEGATIVE

## 2019-07-24 NOTE — Patient Instructions (Addendum)
Stop taking aspirin  Continue taking all other current medications without change through the day before surgery.  Make sure to bring all of your medications with you when you come for your Pre-Admission Testing appointment at [facility] Short-Stay Department.  Have nothing to eat or drink after midnight the night before surgery.  On the morning of surgery do not take any oral medications.  You may use your inhaler as desired.

## 2019-07-24 NOTE — Progress Notes (Signed)
PCP:  [name], MD Cardiologist:  Dr.  Curt Bears  EKG:  07/24/19 CXR:  07/24/19 ECHO:  05/26/19 Stress Test:  denies Cardiac Cath:  05/26/19  Covid test 07/23/18  Patient denies shortness of breath, fever, cough, and chest pain at PAT appointment.  Patient verbalized understanding of instructions provided today at the PAT appointment.  Patient asked to review instructions at home and day of surgery.

## 2019-07-24 NOTE — Progress Notes (Signed)
[address]       [place], [address]             [phone]     CARDIOTHORACIC SURGERY OFFICE NOTE  Primary Cardiologist is Will Meredith Leeds, MD PCP is [name], MD   HPI:  Patient is a 74 year old female with history of mitral valve prolapse, mitral regurgitation, palpitations, and remote history of tobacco use who returns the office today for follow-up of mitral valve prolapse with stage D severe symptomatic primary mitral regurgitation.  She was originally seen in consultation on May 29, 2019.  At that time we made tentative plan for elective mitral valve repair.  Subsequent CT angiogram of the chest, abdomen, and pelvis performed June 06, 2019 revealed a 3.9 cm area of subsolid opacity in the middle of the left lower lobe suspicious for possible adenocarcinoma versus focal infectious inflammatory process.  Repeat CT scan of the chest performed July 20, 2019 continue to demonstrate presence of mixed solid and groundglass opacity measuring 3.9 x 2.5 x 2.1 cm in the middle of the left lower lobe.  There was no significant improvement over the interval period of time to suggest resolving infectious process and the increasing solid component appeared worrisome for possible adenocarcinoma.  The patient returns to our office today to discuss the results of this test and make further plans for elective mitral valve repair.  She reports no new problems or complaints over the past 2 months.  She describes stable symptoms of exertional shortness of breath that occur with moderate and sometimes low-level activity.  For example, the patient gets mild shortness of breath walking just 50 feet on level ground.  She denies resting shortness of breath, PND, orthopnea, or lower extremity edema.  She has not had productive cough, hemoptysis, nor wheezing.  Appetite is good.  Energy level is good.  She has not had fevers or chills.  She has no pleuritic chest  discomfort.  She is accompanied by her good friend, Ms. Bonner Puna [facility] for her office consultation visit today.   Current Outpatient Medications  Medication Sig Dispense Refill  . acetaminophen (TYLENOL) 500 MG tablet Take 500-1,000 mg by mouth every 6 (six) hours as needed for moderate pain (depends on pain if takes 1-2 tablets).    [name] albuterol (PROVENTIL) (2.5 MG/3ML) 0.083% nebulizer solution Take 2.5 mg by nebulization every 4 (four) hours as needed for shortness of breath.     [name] albuterol (VENTOLIN HFA) 108 (90 Base) MCG/ACT inhaler Inhale 1-2 puffs into the lungs every 4 (four) hours as needed for wheezing or shortness of breath. PRO AIR    . aspirin 81 MG tablet Take 81 mg by mouth daily.    . Biotin 5000 MCG TABS Take 5,000 mcg by mouth daily.    [name] buPROPion (WELLBUTRIN XL) 300 MG 24 hr tablet Take 300 mg by mouth daily.    . Ca Phosphate-Cholecalciferol (CALCIUM/VITAMIN D3 GUMMIES PO) Take 2 each by mouth 2 (two) times daily. Alive Woman's over 49    . Cyanocobalamin (VITAMIN B-12) 5000 MCG TBDP Take 5,000 mcg by mouth daily.    . fluticasone furoate-vilanterol (BREO ELLIPTA) 100-25 MCG/INH AEPB USE 1 PUFF DAILY AS DIRECTED (Patient taking differently: Inhale 1 puff into the lungs daily. ) 60 each 3  . hydroxypropyl methylcellulose / hypromellose (ISOPTO TEARS / GONIOVISC) 2.5 % ophthalmic solution Place 1 drop into both eyes daily as needed for dry eyes.    [name]  montelukast (SINGULAIR) 10 MG tablet Take 10 mg by mouth at bedtime.     . Vitamin D, Ergocalciferol, (DRISDOL) 1.25 MG (50000 UT) CAPS capsule Take 50,000 Units by mouth 2 (two) times a week. Sun and Thurs    . Multiple Vitamins-Minerals (MULTIVITAMIN WITH MINERALS) tablet Take 1 tablet by mouth daily. Master's complete     No current facility-administered medications for this visit.      Physical Exam:   BP 118/76 (BP Location: Right Arm, Patient Position: Sitting, Cuff Size: Normal)   Pulse 95   Temp (!) 96.8 F (36 C)    Resp 16   Ht 5' 2.5" (1.588 m)   Wt 171 lb 6.4 oz (77.7 kg)   SpO2 96% Comment: RA  BMI 30.85 kg/m   General:  Well-appearing  Chest:   Clear to auscultation with symmetrical breath sounds  CV:   Regular rate and rhythm with prominent holosystolic murmur  Incisions:  n/a  Abdomen:  Soft nontender  Extremities:  Warm and well-perfused  Diagnostic Tests:  CT ANGIOGRAPHY CHEST, ABDOMEN AND PELVIS  TECHNIQUE: Multidetector CT imaging through the chest, abdomen and pelvis was performed using the standard protocol during bolus administration of intravenous contrast. Multiplanar reconstructed images and MIPs were obtained and reviewed to evaluate the vascular anatomy.  CONTRAST:  59mL ISOVUE-370 IOPAMIDOL (ISOVUE-370) INJECTION 76%  COMPARISON:  04/05/2013  FINDINGS: CTA CHEST FINDINGS  Cardiovascular: Mild cardiomegaly. No significant pericardial effusion. Satisfactory opacification of pulmonary arteries noted, and there is no evidence of pulmonary emboli. Left atrial enlargement. Adequate contrast opacification of the thoracic aorta with no evidence of dissection, aneurysm, or stenosis. There is classic 3-vessel brachiocephalic arch anatomy without proximal stenosis. Minimal atheromatous change.  Mediastinum/Nodes: No hilar or mediastinal adenopathy.  Lungs/Pleura: No pleural effusion. Pulmonary emphysema. No pneumothorax. 3.9 cm sub solid mid left lower lobe pulmonary opacity. 4 mm probable intrapulmonary lymph node adjacent to the left major fissure (Im98,Se5) , stable since 04/04/2013. 3 mm nodule adjacent to the left major fissure (Im62,Se5) , non-specific.  Musculoskeletal: No chest wall abnormality. No acute or significant osseous findings.  Review of the MIP images confirms the above findings.  CTA ABDOMEN AND PELVIS FINDINGS  VASCULAR  Aorta: Mild calcified plaque. No aneurysm, dissection, or stenosis. No significant tortuosity.  Celiac:  Patent without evidence of aneurysm, dissection, vasculitis or significant stenosis.  SMA: Patent without evidence of aneurysm, dissection, vasculitis or significant stenosis.  Renals: Both renal arteries are patent without evidence of aneurysm, dissection, vasculitis, fibromuscular dysplasia or significant stenosis.  IMA: Patent without evidence of aneurysm, dissection, vasculitis or significant stenosis.  Inflow: Minimal calcified plaque in the common iliac arteries. No aneurysm, dissection, or stenosis. Visualized proximal lower extremity outflow patent.  Veins: Patent bilateral renal veins and portal vein. Unremarkable ilio-caval venous anatomy. No obvious venous pathology within limitations of this arterial phase study.  Review of the MIP images confirms the above findings.  NON-VASCULAR  Hepatobiliary: Stable anterior right hepatic cysts. No new lesion. No biliary ductal dilatation. Gallbladder not visualized.  Pancreas: Unremarkable. No pancreatic ductal dilatation or surrounding inflammatory changes.  Spleen: Normal in size without focal abnormality.  Adrenals/Urinary Tract: Normal adrenals. Interval enlargement of 4.6 cm mid right renal cyst. No hydronephrosis. Urinary bladder incompletely distended.  Stomach/Bowel: Stomach and small bowel are nondistended. Normal appendix. A few scattered distal descending and proximal sigmoid diverticula without significant adjacent inflammatory/edematous change or abscess.  Lymphatic: No abdominal or pelvic adenopathy.  Reproductive: Uterus and bilateral adnexa are  unremarkable.  Other: No ascites. No free air.  Musculoskeletal: Mild degenerative disc disease L4-5. Facet DJD L4-S1. No fracture or worrisome bone lesion.  Review of the MIP images confirms the above findings.  IMPRESSION: 1. Negative for acute PE or thoracic aortic dissection. 2. 3.9 cm sub-solid left lower lobe pulmonary opacity, may  represent focal infectious/inflammatory process versus adenocarcinoma. Follow-up recommended. 3. Scattered colonic diverticula.  Aortic Atherosclerosis (ICD10-I70.0) and Emphysema (ICD10-J43.9).   Electronically Signed   By: Lucrezia Europe M.D.   On: 06/06/2019 16:46    CT CHEST WITH CONTRAST  TECHNIQUE: Multidetector CT imaging of the chest was performed during intravenous contrast administration.  CONTRAST:  57mL ISOVUE-300 IOPAMIDOL (ISOVUE-300) INJECTION 61%  COMPARISON:  CT chest pulmonary angiogram, 06/06/2019, CT abdomen pelvis, 04/04/2013  FINDINGS: Cardiovascular: Scattered aortic atherosclerosis. Enlargement of the left atrium. No pericardial effusion.  Mediastinum/Nodes: No enlarged mediastinal, hilar, or axillary lymph nodes. Thyroid gland, trachea, and esophagus demonstrate no significant findings.  Lungs/Pleura: Mild centrilobular emphysema. There is a redemonstrated mixed solid and ground-glass nodule of the left lower lobe. This is not significantly changed in overall size compared to prior examination, measuring 3.9 x 2.5 x 2.1 cm (series 8, image 88) however does demonstrate some increasing solid character, particularly in the inferior aspect (series 8, image 90, series 4, image 104). Additional stable, benign 4 mm nodule of the adjacent left lower lobe, seen on prior examination dated 04/04/2013 (series 8, image 97). There are additional scattered, nonspecific small ground-glass opacities most notably of the bilateral lung apices (series 8, image 25, 19). No pleural effusion or pneumothorax.  Upper Abdomen: No acute abnormality.  Musculoskeletal: No chest wall mass or suspicious bone lesions identified.  IMPRESSION: 1. There is a redemonstrated mixed solid and ground-glass nodule of the left lower lobe. This is not significantly changed in overall size compared to prior examination, measuring 3.9 x 2.5 x 2.1 cm (series 8, image 88)  however does demonstrate some increasing solid character, particularly in the inferior aspect (series 8, image 90, series 4, image 104). Persistence and increasing solidity are highly concerning for adenocarcinoma. Recommend tissue sampling and consideration of PET-CT for metabolic characterization (size of solid component should be sufficient to evaluate). 2. There are additional scattered, nonspecific small ground-glass opacities most notably of the bilateral lung apices, given appearance and distribution likely infectious or inflammatory (series 8, image 25, 19). Attention on follow-up. 3. Mild centrilobular emphysema.  Emphysema (ICD10-J43.9). 4.  Aortic Atherosclerosis (ICD10-I70.0).  These results will be called to the ordering clinician or representative by the Radiologist Assistant, and communication documented in the PACS or zVision Dashboard.   Electronically Signed   By: Eddie Candle M.D.   On: 07/20/2019 17:04     Impression:  Patient has mitral valve prolapse with stage D severe symptomatic primary mitral regurgitation.  She describes stable symptoms of exertional shortness of breath consistent with chronic diastolic congestive heart failure, New York Heart Association functional class IIb.  She gets short of breath with walking moderate level distance on flat ground.  Symptoms have remained stable over the past month.  Routine preoperative CT scanning demonstrates incidental finding of groundglass opacity and mixed semisolid mass in the left lower lobe worrisome for possible slow-growing primary adenocarcinoma of the lung.  This opacity has not improved on follow-up imaging as would be expected with resolving inflammatory process such as pneumonia.    Plan:  I discussed the results of the patient's 2 recent CT scans at length with  the patient and her friend here in the office today.  We discussed that I am concerned that she may have primary lung cancer, although  the scans are certainly not diagnostic but more suggestive of a slow-growing adenocarcinoma.  The mass itself is deep within the middle of the left lower lobe, and I suspect that left lower lobectomy would likely be necessary for complete surgical removal.  Navigational bronchoscopy might provide means for preoperative tissue diagnosis, but a negative biopsy would not completely rule out the possibility of a malignancy.  We also discussed this new finding within the context of the patient's symptomatic primary mitral regurgitation.  Her mitral regurgitation is severe enough that she might struggle with recovery following elective left lower lobectomy if her mitral regurgitation is not corrected first.  Under the circumstances, it probably makes sense to proceed with minimally invasive mitral valve repair prior to any surgical intervention for diagnosis or treatment of the left lung mass.  Unfortunately, this will be associated with at least some delay in diagnosis and definitive treatment of her left lung mass.  I have personally reviewed the patient's chest CT scans with my partners to discuss treatment strategies further.  As a neck step we will postpone the patient's mitral valve repair to allow for PET/CT imaging of the left lung mass that will not be affected by postoperative changes following her cardiac surgery.  We will tentatively plan to proceed with minimally invasive mitral valve repair next week on August 03, 2019.  The patient will return to our office for 1 last follow-up appointment prior to surgery on July 31, 2019 where we will also review the results of her PET CT scan.  All questions answered.  I spent in excess of 20 minutes during the conduct of this office consultation and >50% of this time involved direct face-to-face encounter with the patient for counseling and/or coordination of their care.    Valentina Gu. Roxy Manns, MD 07/24/2019 12:54 PM

## 2019-07-25 LAB — HEMOGLOBIN A1C
Hgb A1c MFr Bld: 5.2 % (ref 4.8–5.6)
Mean Plasma Glucose: 103 mg/dL

## 2019-07-25 NOTE — Progress Notes (Addendum)
Anesthesia Chart Review:  Case: [id number] Date/Time: 08/03/19 0713   Procedures:      MINIMALLY INVASIVE MITRAL VALVE REPAIR (MVR) (Right Chest)     Possible, MINIMALLY INVASIVE TRICUSPID VALVE REPAIR (Right Chest)     TRANSESOPHAGEAL ECHOCARDIOGRAM (TEE) (N/A )   Anesthesia type: General   Pre-op diagnosis: Mitral Regurgitation   Location: [facility] OR ROOM 15 / [place] [facility]   Surgeons: Rexene Alberts, MD      DISCUSSION: Patient is a 74 year old female scheduled for the above procedure. Surgery was initially scheduled for 07/26/19, but rescheduled for 08/03/19 to allow time for PET scan (scheduled for 07/28/19) for further evaluation of LLL lung nodule on 07/20/19 chest CT. Dr. Roxy Manns have a follow-up visit with her on 05/30/20 to discuss PET scan results and next steps based on results.  Other history includes former smoker (quit 1988), MVP with severe MR, moderate to severe TR (05/2019), dyspnea, GERD, ADD, claustrophobia, migraines, squamous cell carcinoma (1999, left groin region), OSA (no CPAP).  Since surgery date rescheduled, surgeon put in orders for CBC, BMET, and T&S on the day of surgery. COVID-19 test is scheduled for 07/31/19.   ADDENDUM 08/20/19 9:30 AM: 07/28/19 PET scan showed increased metabolic intake in the left lower lobe mass with SUV max of 3.47 suspicious for primary bronchogenic carcinoma, possibly adenocarcinoma without suggestion of metastatic disease.  By notes, case reviewed by several CT surgeons with consensus to proceed with MV repair followed by delayed surgical treatment of presumed lung cancer once recovered.    07/31/19 COVID-19 test negative.  Anesthesia team to evaluate on the day of surgery.   VS: BP 124/66   Pulse 92   Temp 37.1 C (Oral)   Resp 18   Ht 5' 2.5" (1.588 m)   Wt 77.7 kg   SpO2 99%   BMI 30.82 kg/m   PROVIDERS: Avva, Ravisankar, MD is PCP  Marshell Garfinkel, MD is pulmonologist Curt Bears, Will, MD is cardiologist   LABS: Labs reviewed: Acceptable for  surgery. (all labs ordered are listed, but only abnormal results are displayed)  Labs Reviewed  APTT - Abnormal; Notable for the following components:      Result Value   aPTT 37 (*)    All other components within normal limits  BLOOD GAS, ARTERIAL - Abnormal; Notable for the following components:   pO2, Arterial 82.0 (*)    All other components within normal limits  COMPREHENSIVE METABOLIC PANEL - Abnormal; Notable for the following components:   Glucose, Bld 102 (*)    Creatinine, Ser 1.07 (*)    GFR calc non Af Amer 51 (*)    GFR calc Af Amer 60 (*)    All other components within normal limits  SURGICAL PCR SCREEN  CBC  HEMOGLOBIN A1C  PROTIME-INR  URINALYSIS, ROUTINE W REFLEX MICROSCOPIC  TYPE AND SCREEN  ABO/RH     IMAGES: PET Scan 07/28/19: IMPRESSION: - Increased FDG uptake associated with left lower lobe lung nodule has an SUV max of 3.47. Findings are suspicious for primary bronchogenic carcinoma, possibly adenocarcinoma. Correlation with tissue sampling advised. - No FDG avid thoracic lymph nodes or evidence of distant metastatic disease. - Aortic Atherosclerosis (ICD10-I70.0) and Emphysema (ICD10-J43.9).  Chest CT 07/20/19: IMPRESSION: 1. There is a redemonstrated mixed solid and ground-glass nodule of the left lower lobe. This is not significantly changed in overall size compared to prior examination, measuring 3.9 x 2.5 x 2.1 cm (series 8, image 88) however does demonstrate some  increasing solid character, particularly in the inferior aspect (series 8, image 90, series 4, image 104). Persistence and increasing solidity are highly concerning for adenocarcinoma. Recommend tissue sampling and consideration of PET-CT for metabolic characterization (size of solid component should be sufficient to evaluate). 2. There are additional scattered, nonspecific small ground-glass opacities most notably of the bilateral lung apices, given appearance and distribution  likely infectious or inflammatory (series 8, image 25, 19). Attention on follow-up. 3. Mild centrilobular emphysema.  Emphysema (ICD10-J43.9). 4.  Aortic Atherosclerosis (ICD10-I70.0).    EKG: 07/24/19:  Sinus rhythm with short PR ST & T wave abnormality, consider inferior ischemia Abnormal ECG   CV: Cardiac cath 05/26/19:  No angiographically apparent coronary artery disease.  The left ventricular ejection fraction is greater than 65% by visual estimate.  The left ventricular systolic function is normal.  LV end diastolic pressure is normal.  There is severe (4+) mitral regurgitation.  There is no aortic valve stenosis.  Hemodynamic findings consistent with mild pulmonary hypertension.  Ao sat 99%, PA sat 75%, mean PA pressure 26 mm Hg; mean PCWP 13 mm Hg;  Aortogram showed no AAA and no renal artery stenosis. Widely patent iliac arteries bilaterally.   Echo TEE 05/26/19: IMPRESSIONS  1. Left ventricular ejection fraction, by visual estimation, is 60 to 65%. The left ventricle has normal function. Normal left ventricular size. Left ventricular septal wall thickness was normal. Normal left ventricular posterior wall thickness. There  is no left ventricular hypertrophy.  2. Global right ventricle has normal systolic function.The right ventricular size is mildly enlarged. No increase in right ventricular wall thickness.  3. Left atrial size was severely dilated.  4. Right atrial size was mildly dilated.  5. The mitral valve is myxomatous. Severe mitral valve regurgitation.  6. Myxomatous valve with flail P2 segment and prolapse of P3 as well Severe eccentric anteriorly directed MR.  7. The tricuspid valve is abnormal. Tricuspid valve regurgitation moderate-severe.  8. The aortic valve is tricuspid. Aortic valve regurgitation is mild.  9. The pulmonic valve was normal in structure. Pulmonic valve regurgitation is mild. 10. Moderately elevated pulmonary artery systolic  pressure. 11. Evidence of atrial level shunting detected by color flow Doppler.   Past Medical History:  Diagnosis Date  . ADD (attention deficit disorder)   . Arthritis    "right knee" (04/19/2013)  . Claustrophobia   . Depression   . Dyspnea   . Dysrhythmia   . Fracture of right tibial plateau 05/2016  . GERD (gastroesophageal reflux disease)   . Heart murmur   . Incidental pulmonary nodule, greater than or equal to 59mm 06/06/2019   Needs f/u CT in 3 months  . Migraine    "bad when I was in 5th or 6th grade; I can usually ward them off by resting my head now" (04/19/2013)  . MVP (mitral valve prolapse)   . Sleep apnea    has not used mouth guard in 5 years no cpap used   . Squamous carcinoma ?1999   "upper left groin" (04/19/2013)    Past Surgical History:  Procedure Laterality Date  . CHOLECYSTECTOMY  04/18/2013  . CHOLECYSTECTOMY N/A 04/18/2013   Procedure: LAPAROSCOPIC CHOLECYSTECTOMY WITH INTRAOPERATIVE CHOLANGIOGRAM;  Surgeon: Ralene Ok, MD;  Location: [facility];  Service: General;  Laterality: N/A;  . COLONOSCOPY W/ POLYPECTOMY    . EYE SURGERY Bilateral FALL  2016   IOC FOR CATARACTS  . KNEE ARTHROSCOPY Right 12/2007   "meniscus repair" (04/19/2013)  . RETINAL  TEAR REPAIR CRYOTHERAPY Left ~ 1997  . RIGHT/LEFT HEART CATH AND CORONARY ANGIOGRAPHY N/A 05/26/2019   Procedure: RIGHT/LEFT HEART CATH AND CORONARY ANGIOGRAPHY;  Surgeon: Jettie Booze, MD;  Location: [place] [facility];  Service: Cardiovascular;  Laterality: N/A;  . TEE WITHOUT CARDIOVERSION N/A 05/26/2019   Procedure: TRANSESOPHAGEAL ECHOCARDIOGRAM (TEE);  Surgeon: Josue Hector, MD;  Location: [facility] ENDOSCOPY;  Service: Cardiovascular;  Laterality: N/A;  . TONSILLECTOMY  ~ 1952  . TOTAL KNEE ARTHROPLASTY Right 09/21/2016   Procedure: RIGHT TOTAL KNEE ARTHROPLASTY;  Surgeon: Gaynelle Arabian, MD;  Location: [facility];  Service: Orthopedics;  Laterality: Right;  requests 72mins with abductor block  . VARICOSE  VEIN SURGERY Right 2000's  . WRIST SURGERY Right 1975   "had a knot taken off" (04/19/2013)    MEDICATIONS: . acetaminophen (TYLENOL) 500 MG tablet  . albuterol (PROVENTIL) (2.5 MG/3ML) 0.083% nebulizer solution  . albuterol (VENTOLIN HFA) 108 (90 Base) MCG/ACT inhaler  . aspirin 81 MG tablet  . Biotin 5000 MCG TABS  . buPROPion (WELLBUTRIN XL) 300 MG 24 hr tablet  . Ca Phosphate-Cholecalciferol (CALCIUM/VITAMIN D3 GUMMIES PO)  . Cyanocobalamin (VITAMIN B-12) 5000 MCG TBDP  . fluticasone furoate-vilanterol (BREO ELLIPTA) 100-25 MCG/INH AEPB  . hydroxypropyl methylcellulose / hypromellose (ISOPTO TEARS / GONIOVISC) 2.5 % ophthalmic solution  . montelukast (SINGULAIR) 10 MG tablet  . Multiple Vitamins-Minerals (MULTIVITAMIN WITH MINERALS) tablet  . Vitamin D, Ergocalciferol, (DRISDOL) 1.25 MG (50000 UT) CAPS capsule   No current facility-administered medications for this encounter.     Myra Gianotti, PA-C Surgical Short Stay/Anesthesiology [facility] Phone [phone] [facility] Phone [phone] 07/25/2019 6:17 PM

## 2019-07-27 ENCOUNTER — Other Ambulatory Visit: Payer: Self-pay | Admitting: *Deleted

## 2019-07-27 DIAGNOSIS — F418 Other specified anxiety disorders: Secondary | ICD-10-CM

## 2019-07-27 MED ORDER — DIAZEPAM 10 MG PO TABS: 10.0000 mg | ORAL_TABLET | Freq: Once | ORAL | 0 refills | Status: AC

## 2019-07-28 ENCOUNTER — Other Ambulatory Visit: Payer: Self-pay

## 2019-07-28 ENCOUNTER — Encounter (HOSPITAL_COMMUNITY)
Admission: RE | Admit: 2019-07-28 | Discharge: 2019-07-28 | Disposition: A | Discharge status: Home / Self Care | Payer: PPO | Source: Ambulatory Visit | Attending: Thoracic Surgery (Cardiothoracic Vascular Surgery) | Admitting: Thoracic Surgery (Cardiothoracic Vascular Surgery)

## 2019-07-28 ENCOUNTER — Telehealth: Payer: Self-pay | Admitting: Thoracic Surgery (Cardiothoracic Vascular Surgery)

## 2019-07-28 ENCOUNTER — Encounter: Payer: Self-pay | Admitting: Thoracic Surgery (Cardiothoracic Vascular Surgery)

## 2019-07-28 DIAGNOSIS — R911 Solitary pulmonary nodule: Secondary | ICD-10-CM | POA: Insufficient documentation

## 2019-07-28 DIAGNOSIS — Z79899 Other long term (current) drug therapy: Secondary | ICD-10-CM | POA: Insufficient documentation

## 2019-07-28 LAB — GLUCOSE, CAPILLARY: Glucose-Capillary: 103 mg/dL — ABNORMAL HIGH (ref 70–99)

## 2019-07-28 MED ORDER — FLUDEOXYGLUCOSE F - 18 (FDG) INJECTION
8.5000 | Freq: Once | INTRAVENOUS | Status: AC | PRN
  Administered 2019-07-28: 8.5 via INTRAVENOUS

## 2019-07-28 NOTE — Telephone Encounter (Signed)
Discussed the results of PET CT scan performed earlier today at length with the patient over the telephone.  Patient understands that findings are consistent with likely primary lung cancer in the lower lobe of her left lung, most likely adenocarcinoma.  She understands that her case has been reviewed by a multidisciplinary team and we all agree that left lower lobectomy will be required because of the size and location of the relatively large groundglass opacity and associated solid tissue components of the mass.  Moreover, all involved agree that the patient should undergo mitral valve repair prior to left lower lobectomy because of concerns that the patient would likely be at very high risk for a variety of cardiac complications if her lung surgery was performed prior to undergoing mitral valve repair.  Patient will keep her appointment previously scheduled in our office early next week for follow-up prior to surgery.  All questions answered.  Rexene Alberts, MD 07/28/2019 11:01 AM

## 2019-07-31 ENCOUNTER — Encounter: Payer: Self-pay | Admitting: Thoracic Surgery (Cardiothoracic Vascular Surgery)

## 2019-07-31 ENCOUNTER — Other Ambulatory Visit: Payer: Self-pay

## 2019-07-31 ENCOUNTER — Other Ambulatory Visit (HOSPITAL_COMMUNITY)
Admission: RE | Admit: 2019-07-31 | Discharge: 2019-07-31 | Disposition: A | Discharge status: Home / Self Care | Payer: PPO | Source: Ambulatory Visit | Attending: Thoracic Surgery (Cardiothoracic Vascular Surgery) | Admitting: Thoracic Surgery (Cardiothoracic Vascular Surgery)

## 2019-07-31 ENCOUNTER — Ambulatory Visit (INDEPENDENT_AMBULATORY_CARE_PROVIDER_SITE_OTHER): Payer: PPO | Admitting: Thoracic Surgery (Cardiothoracic Vascular Surgery)

## 2019-07-31 VITALS — BP 125/76 | HR 98 | Temp 97.6°F | Resp 20 | Ht 62.5 in | Wt 170.0 lb

## 2019-07-31 DIAGNOSIS — G43909 Migraine, unspecified, not intractable, without status migrainosus: Secondary | ICD-10-CM | POA: Diagnosis present

## 2019-07-31 DIAGNOSIS — Z01812 Encounter for preprocedural laboratory examination: Secondary | ICD-10-CM | POA: Insufficient documentation

## 2019-07-31 DIAGNOSIS — I350 Nonrheumatic aortic (valve) stenosis: Secondary | ICD-10-CM | POA: Diagnosis not present

## 2019-07-31 DIAGNOSIS — I340 Nonrheumatic mitral (valve) insufficiency: Secondary | ICD-10-CM

## 2019-07-31 DIAGNOSIS — G4730 Sleep apnea, unspecified: Secondary | ICD-10-CM | POA: Diagnosis not present

## 2019-07-31 DIAGNOSIS — M1990 Unspecified osteoarthritis, unspecified site: Secondary | ICD-10-CM | POA: Diagnosis present

## 2019-07-31 DIAGNOSIS — Z20822 Contact with and (suspected) exposure to covid-19: Secondary | ICD-10-CM | POA: Diagnosis present

## 2019-07-31 DIAGNOSIS — I272 Pulmonary hypertension, unspecified: Secondary | ICD-10-CM | POA: Diagnosis present

## 2019-07-31 DIAGNOSIS — Z8249 Family history of ischemic heart disease and other diseases of the circulatory system: Secondary | ICD-10-CM | POA: Diagnosis not present

## 2019-07-31 DIAGNOSIS — J939 Pneumothorax, unspecified: Secondary | ICD-10-CM | POA: Diagnosis not present

## 2019-07-31 DIAGNOSIS — R911 Solitary pulmonary nodule: Secondary | ICD-10-CM | POA: Diagnosis not present

## 2019-07-31 DIAGNOSIS — I5032 Chronic diastolic (congestive) heart failure: Secondary | ICD-10-CM | POA: Diagnosis present

## 2019-07-31 DIAGNOSIS — Z91011 Allergy to milk products: Secondary | ICD-10-CM | POA: Diagnosis not present

## 2019-07-31 DIAGNOSIS — I341 Nonrheumatic mitral (valve) prolapse: Secondary | ICD-10-CM | POA: Diagnosis not present

## 2019-07-31 DIAGNOSIS — G4733 Obstructive sleep apnea (adult) (pediatric): Secondary | ICD-10-CM | POA: Diagnosis present

## 2019-07-31 DIAGNOSIS — Z87891 Personal history of nicotine dependence: Secondary | ICD-10-CM | POA: Diagnosis not present

## 2019-07-31 DIAGNOSIS — I083 Combined rheumatic disorders of mitral, aortic and tricuspid valves: Secondary | ICD-10-CM | POA: Diagnosis present

## 2019-07-31 DIAGNOSIS — I4891 Unspecified atrial fibrillation: Secondary | ICD-10-CM | POA: Diagnosis not present

## 2019-07-31 DIAGNOSIS — J9811 Atelectasis: Secondary | ICD-10-CM | POA: Diagnosis not present

## 2019-07-31 DIAGNOSIS — Z8262 Family history of osteoporosis: Secondary | ICD-10-CM | POA: Diagnosis not present

## 2019-07-31 DIAGNOSIS — D62 Acute posthemorrhagic anemia: Secondary | ICD-10-CM | POA: Diagnosis not present

## 2019-07-31 DIAGNOSIS — J45909 Unspecified asthma, uncomplicated: Secondary | ICD-10-CM | POA: Diagnosis present

## 2019-07-31 DIAGNOSIS — I361 Nonrheumatic tricuspid (valve) insufficiency: Secondary | ICD-10-CM

## 2019-07-31 DIAGNOSIS — Z7951 Long term (current) use of inhaled steroids: Secondary | ICD-10-CM | POA: Diagnosis not present

## 2019-07-31 DIAGNOSIS — Z880 Allergy status to penicillin: Secondary | ICD-10-CM | POA: Diagnosis not present

## 2019-07-31 DIAGNOSIS — K219 Gastro-esophageal reflux disease without esophagitis: Secondary | ICD-10-CM | POA: Diagnosis present

## 2019-07-31 DIAGNOSIS — Z96651 Presence of right artificial knee joint: Secondary | ICD-10-CM | POA: Diagnosis present

## 2019-07-31 DIAGNOSIS — E8770 Fluid overload, unspecified: Secondary | ICD-10-CM | POA: Diagnosis not present

## 2019-07-31 DIAGNOSIS — Z833 Family history of diabetes mellitus: Secondary | ICD-10-CM | POA: Diagnosis not present

## 2019-07-31 DIAGNOSIS — F988 Other specified behavioral and emotional disorders with onset usually occurring in childhood and adolescence: Secondary | ICD-10-CM | POA: Diagnosis present

## 2019-07-31 DIAGNOSIS — Q211 Atrial septal defect: Secondary | ICD-10-CM | POA: Diagnosis not present

## 2019-07-31 DIAGNOSIS — Z885 Allergy status to narcotic agent status: Secondary | ICD-10-CM | POA: Diagnosis not present

## 2019-07-31 NOTE — Progress Notes (Signed)
[address]       [place] [address]             [phone]     CARDIOTHORACIC SURGERY OFFICE NOTE  Primary Cardiologist is Will Meredith Leeds, MD PCP is [name], MD   HPI:  Patient is a 74 year old female with history of mitral valve prolapse, mitral regurgitation, palpitations, and remote history of tobacco use who returns the office today for follow-up of mitral valve prolapse with stage D severe symptomatic primary mitral regurgitation.  She was originally seen in consultation on May 29, 2019.  Shortly after that she underwent CT angiography as part of her routine preoperative work-up for surgery and was incidentally found to have a lung mass located in the left lower lobe.  Initial radiographic appearance revealed primarily groundglass opacity with small amounts of solid component potentially consistent with infection versus malignancy.  Follow-up imaging was recommended and plans for mitral valve repair were postponed.  Repeat CT scan performed July 20, 2019 revealed persistent large groundglass opacity with more pronounced solid components consistent with likely slow-growing primary lung cancer.  Patient underwent FDG PET scan July 28, 2019 and returns her office today for follow-up.  PET scan confirmed the presence of increased metabolic intake in the left lower lobe mass with SUV max of 3.47 suspicious for primary bronchogenic carcinoma, possibly adenocarcinoma.  There was no sign of increased uptake in the mediastinum or elsewhere in the body to suggest metastatic spread of disease.  Patient returns to our office today to discuss the results of the test further and make final plans for elective mitral valve repair.  She reports no new problems or complaints over the past week.  She specifically denies any productive cough, fevers, chills, hemoptysis, or recent exposure to persons with known or suspected COVID-19 infection.  She describes stable  symptoms of exertional shortness of breath and fatigue consistent with chronic diastolic congestive heart failure, New York Heart Association functional class IIb.     Current Outpatient Medications  Medication Sig Dispense Refill  . acetaminophen (TYLENOL) 500 MG tablet Take 500-1,000 mg by mouth every 6 (six) hours as needed for moderate pain (depends on pain if takes 1-2 tablets).    [name] albuterol (PROVENTIL) (2.5 MG/3ML) 0.083% nebulizer solution Take 2.5 mg by nebulization every 4 (four) hours as needed for shortness of breath.     [name] albuterol (VENTOLIN HFA) 108 (90 Base) MCG/ACT inhaler Inhale 1-2 puffs into the lungs every 4 (four) hours as needed for wheezing or shortness of breath. PRO AIR    . aspirin 81 MG tablet Take 81 mg by mouth daily.    . Biotin 5000 MCG TABS Take 5,000 mcg by mouth daily.    [name] buPROPion (WELLBUTRIN XL) 300 MG 24 hr tablet Take 300 mg by mouth daily.    . Ca Phosphate-Cholecalciferol (CALCIUM/VITAMIN D3 GUMMIES PO) Take 2 each by mouth 2 (two) times daily. Alive Woman's over 80    . Cyanocobalamin (VITAMIN B-12) 5000 MCG TBDP Take 5,000 mcg by mouth daily.    . fluticasone furoate-vilanterol (BREO ELLIPTA) 100-25 MCG/INH AEPB USE 1 PUFF DAILY AS DIRECTED (Patient taking differently: Inhale 1 puff into the lungs daily. ) 60 each 3  . hydroxypropyl methylcellulose / hypromellose (ISOPTO TEARS / GONIOVISC) 2.5 % ophthalmic solution Place 1 drop into both eyes daily as needed for dry eyes.    . montelukast (SINGULAIR) 10 MG tablet Take 10 mg by mouth  at bedtime.     . Multiple Vitamins-Minerals (MULTIVITAMIN WITH MINERALS) tablet Take 1 tablet by mouth daily. Master's complete    . Vitamin D, Ergocalciferol, (DRISDOL) 1.25 MG (50000 UT) CAPS capsule Take 50,000 Units by mouth 2 (two) times a week. Nancy Fetter and Thurs     No current facility-administered medications for this visit.      Physical Exam:   BP 125/76   Pulse 98   Temp 97.6 F (36.4 C) (Skin)   Resp 20    Ht 5' 2.5" (1.588 m)   Wt 170 lb (77.1 kg)   SpO2 95% Comment: RA  BMI 30.60 kg/m   General:  Well-appearing  Chest:   Clear to auscultation with symmetrical breath sounds  CV:   Regular rate and rhythm with grade 3/6 holosystolic murmur  Incisions:  n/a  Abdomen:  Soft nontender  Extremities:  Warm and well-perfused  Diagnostic Tests:  NUCLEAR MEDICINE PET SKULL BASE TO THIGH  TECHNIQUE: 8.5 mCi F-18 FDG was injected intravenously. Full-ring PET imaging was performed from the skull base to thigh after the radiotracer. CT data was obtained and used for attenuation correction and anatomic localization.  Fasting blood glucose: 103 mg/dl  COMPARISON:  07/20/2019  FINDINGS: Mediastinal blood pool activity: SUV max 2.8  Liver activity: SUV max NA  NECK: No hypermetabolic lymph nodes in the neck.  Incidental CT findings: none  CHEST: The persistent left lower lobe lung nodule measures 2.5 x 2.0 cm and has an SUV max of 3.47. This is not significantly changed in size from 06/06/2019 but appears progressively more solid.  Also in the left lower lobe is a 3 mm, too small to characterize lung nodule, image 43/8.  Incidental CT findings: Centrilobular emphysema. Mild aortic atherosclerosis.  ABDOMEN/PELVIS: No abnormal hypermetabolic activity within the liver, pancreas, adrenal glands, or spleen. No hypermetabolic lymph nodes in the abdomen or pelvis.  Incidental CT findings: Chronic cysts within right hepatic lobe, similar to previous exam from 04/04/2013. Aortic atherosclerosis identified. 4.8 cm cyst arises from upper pole of the right kidney. Small indeterminate lesion arising from posterior cortex of left kidney measures 7 mm. This is too small to characterize.  SKELETON: No focal hypermetabolic activity to suggest skeletal metastasis.  Incidental CT findings: none  IMPRESSION: Increased FDG uptake associated with left lower lobe lung nodule  has an SUV max of 3.47. Findings are suspicious for primary bronchogenic carcinoma, possibly adenocarcinoma. Correlation with tissue sampling advised.  No FDG avid thoracic lymph nodes or evidence of distant metastatic disease.  Aortic Atherosclerosis (ICD10-I70.0) and Emphysema (ICD10-J43.9).   Electronically Signed   By: Kerby Moors M.D.   On: 07/28/2019 10:04   Impression:  Patient has mitral valve prolapse with stage D severe symptomatic primary mitral regurgitation.  She describes gradual progression of symptoms of exertional shortness of breath consistent with chronic diastolic congestive heart failure, New York Heart Association functional class IIb.  I have personally reviewed the patient's recent transesophageal echocardiogram and diagnostic cardiac catheterization.  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  They are ruptured primary chordae tendinae to the P2 segment causing a flail leaflet with severe mitral regurgitation.  Left ventricular systolic function remains normal.  There is severe left atrial enlargement.  There is significant tricuspid regurgitation.  I agree the patient would benefit from elective mitral valve repair.  Risks associated with surgery should be relatively low based  upon appearance of TEE feel there is greater than 95% likelihood of successful valve repair.  Subsequent CT and PET imaging demonstrate a large groundglass opacity with some soft tissue component in the left lung that is hypermetabolic on PET imaging and highly suspicious for primary lung cancer, suspect adenocarcinoma.  There is no sign of metastatic lymphadenopathy or distant metastatic disease.  Definitive treatment will likely require left lower lobectomy because of the size and location of the mass.  The patient's case has been reviewed at length with Dr. Roxan Hockey and  other partners, all of whom concur that the patient would likely best be treated by initially undergoing mitral valve repair followed by delayed surgical treatment of her presumed lung cancer.   Plan:  I have again reviewed the results of the patient's recent PET CT scan with the patient and her close friend, Ms. Darrol Jump [facility].  The concerns that the lung mass likely represents primary lung cancer was explained.  The rationale for proceeding with elective mitral valve repair first followed by delayed definitive treatment of the patient's lung mass was explained at length.  The patient was also counseled again regarding the indications, risks and potential benefits of mitral valve repair.    The likelihood of successful and durable mitral valve repair has been discussed with particular reference to the findings of their recent echocardiogram.  Based upon these findings and previous experience, I have quoted them a greater than 95 percent likelihood of successful valve repair with less than 1 percent risk of mortality or major morbidity.  Alternative surgical approaches have been discussed including a comparison between conventional sternotomy and minimally-invasive techniques.  The relative risks and benefits of each have been reviewed as they pertain to the patient's specific circumstances, and expectations for the patient's postoperative convalescence has been discussed.   In the unlikely event that the patient's valve cannot be successfully repaired, we discussed the possibility of replacing the mitral valve using a mechanical prosthesis with the attendant need for long-term anticoagulation versus the alternative of replacing it using a bioprosthetic tissue valve with its potential for late structural valve deterioration and failure, depending upon the patient's longevity.  The patient specifically requests that if the mitral valve must be replaced that it be done using a bioprosthetic tissue valve.      The patient understands and accepts all potential risks of surgery including but not limited to risk of death, stroke or other neurologic complication, myocardial infarction, congestive heart failure, respiratory failure, renal failure, bleeding requiring transfusion and/or reexploration, arrhythmia, infection or other wound complications, pneumonia, pleural and/or pericardial effusion, pulmonary embolus, aortic dissection or other major vascular complication, or delayed complications related to valve repair or replacement including but not limited to structural valve deterioration and failure, thrombosis, embolization, endocarditis, or paravalvular leak.  Specific risks potentially related to the minimally-invasive approach were discussed at length, including but not limited to risk of conversion to full or partial sternotomy, aortic dissection or other major vascular complication, unilateral acute lung injury or pulmonary edema, phrenic nerve dysfunction or paralysis, rib fracture, chronic pain, lung hernia, or lymphocele.  Patient also understands the possibility that her presumed lung cancer might progress depending upon how soon she will be ready for another major surgical procedure.  All of their questions have been answered.    I spent in excess of 20 minutes during the conduct of this office consultation and >50% of this time involved direct face-to-face encounter with the patient for counseling and/or  coordination of their care.   Valentina Gu. Roxy Manns, MD 07/31/2019 1:48 PM

## 2019-07-31 NOTE — Patient Instructions (Signed)
Stop taking aspirin  Continue taking all other medications without change through the day before surgery.  Make sure to bring all of your medications with you when you come for your Pre-Admission Testing appointment at [facility] Short-Stay Department.  Have nothing to eat or drink after midnight the night before surgery.  On the morning of surgery do not take any medications.  You may use your inhaler as desired.  At your appointment for Pre-Admission Testing at the [facility] Short-Stay Department you will be asked to sign permission forms for your upcoming surgery.  By definition your signature on these forms implies that you and/or your designee provide full informed consent for your planned surgical procedure(s), that alternative treatment options have been discussed, that you understand and accept any and all potential risks, and that you have some understanding of what to expect for your post-operative convalescence.  For any major cardiac surgical procedure potential operative risks include but are not limited to at least some risk of death, stroke or other neurologic complication, myocardial infarction, congestive heart failure, respiratory failure, renal failure, bleeding requiring blood transfusion and/or reexploration, irregular heart rhythm, heart block or bradycardia requiring permanent pacemaker, pneumonia, pericardial effusion, pleural effusion, wound infection, pulmonary embolus or other thromboembolic complication, chronic pain, or other complications related to the specific procedure(s) performed.  Please call to schedule a follow-up appointment in our office prior to surgery if you have any unresolved questions about your planned surgical procedure, the associated risks, alternative treatment options, and/or expectations for your post-operative recovery.

## 2019-08-01 LAB — NOVEL CORONAVIRUS, NAA (HOSP ORDER, SEND-OUT TO REF LAB; TAT 18-24 HRS): SARS-CoV-2, NAA: NOT DETECTED

## 2019-08-02 ENCOUNTER — Encounter (HOSPITAL_COMMUNITY): Payer: Self-pay | Admitting: Thoracic Surgery (Cardiothoracic Vascular Surgery)

## 2019-08-02 ENCOUNTER — Other Ambulatory Visit: Payer: Self-pay

## 2019-08-02 MED ORDER — EPINEPHRINE PF 1 MG/ML IJ SOLN
0.0000 ug/min | INTRAVENOUS | Status: DC
  Filled 2019-08-02: qty 4

## 2019-08-02 MED ORDER — TRANEXAMIC ACID (OHS) BOLUS VIA INFUSION
15.0000 mg/kg | INTRAVENOUS | Status: AC
  Administered 2019-08-03: 1156.5 mg via INTRAVENOUS
  Filled 2019-08-02: qty 1157

## 2019-08-02 MED ORDER — INSULIN REGULAR(HUMAN) IN NACL 100-0.9 UT/100ML-% IV SOLN
INTRAVENOUS | Status: AC
  Administered 2019-08-03: .8 [IU]/h via INTRAVENOUS
  Filled 2019-08-02: qty 100

## 2019-08-02 MED ORDER — MILRINONE LACTATE IN DEXTROSE 20-5 MG/100ML-% IV SOLN
0.3000 ug/kg/min | INTRAVENOUS | Status: AC
  Administered 2019-08-03: .25 ug/kg/min via INTRAVENOUS
  Filled 2019-08-02: qty 100

## 2019-08-02 MED ORDER — MANNITOL 20 % IV SOLN
Freq: Once | INTRAVENOUS | Status: DC
  Filled 2019-08-02: qty 13

## 2019-08-02 MED ORDER — VANCOMYCIN HCL 1000 MG IV SOLR
INTRAVENOUS | Status: AC
  Administered 2019-08-03: 1000 mL
  Filled 2019-08-02: qty 1000

## 2019-08-02 MED ORDER — PLASMA-LYTE 148 IV SOLN
INTRAVENOUS | Status: AC
  Administered 2019-08-03: 500 mL
  Filled 2019-08-02: qty 2.5

## 2019-08-02 MED ORDER — NOREPINEPHRINE 4 MG/250ML-% IV SOLN
0.0000 ug/min | INTRAVENOUS | Status: DC
  Filled 2019-08-02: qty 250

## 2019-08-02 MED ORDER — POTASSIUM CHLORIDE 2 MEQ/ML IV SOLN
80.0000 meq | INTRAVENOUS | Status: DC
  Filled 2019-08-02: qty 40

## 2019-08-02 MED ORDER — PHENYLEPHRINE HCL-NACL 20-0.9 MG/250ML-% IV SOLN
30.0000 ug/min | INTRAVENOUS | Status: AC
  Administered 2019-08-03: 15 ug/min via INTRAVENOUS
  Filled 2019-08-02: qty 250

## 2019-08-02 MED ORDER — TRANEXAMIC ACID (OHS) PUMP PRIME SOLUTION
2.0000 mg/kg | INTRAVENOUS | Status: DC
  Filled 2019-08-02: qty 1.54

## 2019-08-02 MED ORDER — SODIUM CHLORIDE 0.9 % IV SOLN
INTRAVENOUS | Status: DC
  Filled 2019-08-02: qty 30

## 2019-08-02 MED ORDER — TRANEXAMIC ACID 1000 MG/10ML IV SOLN
1.5000 mg/kg/h | INTRAVENOUS | Status: AC
  Administered 2019-08-03: 1.5 mg/kg/h via INTRAVENOUS
  Filled 2019-08-02: qty 25

## 2019-08-02 MED ORDER — LEVOFLOXACIN IN D5W 500 MG/100ML IV SOLN
500.0000 mg | INTRAVENOUS | Status: AC
  Administered 2019-08-03: 500 mg via INTRAVENOUS
  Filled 2019-08-02: qty 100

## 2019-08-02 MED ORDER — NITROGLYCERIN IN D5W 200-5 MCG/ML-% IV SOLN
2.0000 ug/min | INTRAVENOUS | Status: DC
  Filled 2019-08-02: qty 250

## 2019-08-02 MED ORDER — DEXMEDETOMIDINE HCL IN NACL 400 MCG/100ML IV SOLN
0.1000 ug/kg/h | INTRAVENOUS | Status: AC
  Administered 2019-08-03: .7 ug/kg/h via INTRAVENOUS
  Filled 2019-08-02: qty 100

## 2019-08-02 MED ORDER — VANCOMYCIN HCL 1250 MG/250ML IV SOLN
1250.0000 mg | INTRAVENOUS | Status: AC
  Administered 2019-08-03: 1250 mg via INTRAVENOUS
  Filled 2019-08-02: qty 250

## 2019-08-02 NOTE — Anesthesia Preprocedure Evaluation (Addendum)
Anesthesia Evaluation  Patient identified by MRN, date of birth, ID band Patient awake    Reviewed: Allergy & Precautions, NPO status , Patient's Chart, lab work & pertinent test results  Airway Mallampati: II  TM Distance: >3 FB Neck ROM: Full    Dental  (+) Teeth Intact, Dental Advisory Given   Pulmonary former smoker,    breath sounds clear to auscultation       Cardiovascular  Rhythm:Regular Rate:Normal + Systolic murmurs    Neuro/Psych    GI/Hepatic   Endo/Other    Renal/GU      Musculoskeletal   Abdominal (+) - obese,   Peds  Hematology   Anesthesia Other Findings   Reproductive/Obstetrics                             Anesthesia Physical Anesthesia Plan  ASA: III  Anesthesia Plan: General   Post-op Pain Management:    Induction: Intravenous  PONV Risk Score and Plan: Ondansetron and Dexamethasone  Airway Management Planned: Double Lumen EBT  Additional Equipment: PA Cath, Arterial line, 3D TEE and Ultrasound Guidance Line Placement  Intra-op Plan:   Post-operative Plan: Extubation in OR  Informed Consent: I have reviewed the patients History and Physical, chart, labs and discussed the procedure including the risks, benefits and alternatives for the proposed anesthesia with the patient or authorized representative who has indicated his/her understanding and acceptance.     Dental advisory given  Plan Discussed with: CRNA and Anesthesiologist  Anesthesia Plan Comments: (PAT note written by Myra Gianotti, PA-C. )      Anesthesia Quick Evaluation

## 2019-08-03 ENCOUNTER — Other Ambulatory Visit: Payer: Self-pay

## 2019-08-03 ENCOUNTER — Inpatient Hospital Stay (HOSPITAL_COMMUNITY)
Admission: RE | Admit: 2019-08-03 | Discharge: 2019-08-08 | DRG: 220 | Disposition: A | Discharge status: Home / Self Care | Payer: PPO | Attending: Thoracic Surgery (Cardiothoracic Vascular Surgery) | Admitting: Thoracic Surgery (Cardiothoracic Vascular Surgery)

## 2019-08-03 ENCOUNTER — Inpatient Hospital Stay (HOSPITAL_COMMUNITY): Payer: PPO

## 2019-08-03 ENCOUNTER — Inpatient Hospital Stay (HOSPITAL_COMMUNITY): Payer: PPO | Admitting: Physician Assistant

## 2019-08-03 ENCOUNTER — Encounter (HOSPITAL_COMMUNITY): Payer: Self-pay | Admitting: Thoracic Surgery (Cardiothoracic Vascular Surgery)

## 2019-08-03 ENCOUNTER — Encounter (HOSPITAL_COMMUNITY)
Admission: RE | Disposition: A | Discharge status: Home / Self Care | Payer: Self-pay | Source: Home / Self Care | Attending: Thoracic Surgery (Cardiothoracic Vascular Surgery)

## 2019-08-03 ENCOUNTER — Inpatient Hospital Stay (HOSPITAL_COMMUNITY): Payer: PPO | Admitting: Certified Registered Nurse Anesthetist

## 2019-08-03 DIAGNOSIS — I083 Combined rheumatic disorders of mitral, aortic and tricuspid valves: Principal | ICD-10-CM | POA: Diagnosis present

## 2019-08-03 DIAGNOSIS — Z8262 Family history of osteoporosis: Secondary | ICD-10-CM | POA: Diagnosis not present

## 2019-08-03 DIAGNOSIS — Q211 Atrial septal defect: Secondary | ICD-10-CM

## 2019-08-03 DIAGNOSIS — J939 Pneumothorax, unspecified: Secondary | ICD-10-CM

## 2019-08-03 DIAGNOSIS — I272 Pulmonary hypertension, unspecified: Secondary | ICD-10-CM | POA: Diagnosis present

## 2019-08-03 DIAGNOSIS — Z96651 Presence of right artificial knee joint: Secondary | ICD-10-CM | POA: Diagnosis present

## 2019-08-03 DIAGNOSIS — Z09 Encounter for follow-up examination after completed treatment for conditions other than malignant neoplasm: Secondary | ICD-10-CM

## 2019-08-03 DIAGNOSIS — Z9689 Presence of other specified functional implants: Secondary | ICD-10-CM

## 2019-08-03 DIAGNOSIS — G43909 Migraine, unspecified, not intractable, without status migrainosus: Secondary | ICD-10-CM | POA: Diagnosis present

## 2019-08-03 DIAGNOSIS — I4891 Unspecified atrial fibrillation: Secondary | ICD-10-CM | POA: Diagnosis not present

## 2019-08-03 DIAGNOSIS — I5032 Chronic diastolic (congestive) heart failure: Secondary | ICD-10-CM | POA: Diagnosis present

## 2019-08-03 DIAGNOSIS — K219 Gastro-esophageal reflux disease without esophagitis: Secondary | ICD-10-CM | POA: Diagnosis present

## 2019-08-03 DIAGNOSIS — Z20822 Contact with and (suspected) exposure to covid-19: Secondary | ICD-10-CM | POA: Diagnosis present

## 2019-08-03 DIAGNOSIS — Z91011 Allergy to milk products: Secondary | ICD-10-CM

## 2019-08-03 DIAGNOSIS — Z9889 Other specified postprocedural states: Secondary | ICD-10-CM

## 2019-08-03 DIAGNOSIS — G4733 Obstructive sleep apnea (adult) (pediatric): Secondary | ICD-10-CM | POA: Diagnosis present

## 2019-08-03 DIAGNOSIS — J45909 Unspecified asthma, uncomplicated: Secondary | ICD-10-CM | POA: Diagnosis present

## 2019-08-03 DIAGNOSIS — Z880 Allergy status to penicillin: Secondary | ICD-10-CM | POA: Diagnosis not present

## 2019-08-03 DIAGNOSIS — I340 Nonrheumatic mitral (valve) insufficiency: Secondary | ICD-10-CM | POA: Diagnosis present

## 2019-08-03 DIAGNOSIS — Z7951 Long term (current) use of inhaled steroids: Secondary | ICD-10-CM | POA: Diagnosis not present

## 2019-08-03 DIAGNOSIS — I350 Nonrheumatic aortic (valve) stenosis: Secondary | ICD-10-CM | POA: Diagnosis not present

## 2019-08-03 DIAGNOSIS — Z8249 Family history of ischemic heart disease and other diseases of the circulatory system: Secondary | ICD-10-CM | POA: Diagnosis not present

## 2019-08-03 DIAGNOSIS — Z833 Family history of diabetes mellitus: Secondary | ICD-10-CM

## 2019-08-03 DIAGNOSIS — Z87891 Personal history of nicotine dependence: Secondary | ICD-10-CM

## 2019-08-03 DIAGNOSIS — F988 Other specified behavioral and emotional disorders with onset usually occurring in childhood and adolescence: Secondary | ICD-10-CM | POA: Diagnosis present

## 2019-08-03 DIAGNOSIS — D62 Acute posthemorrhagic anemia: Secondary | ICD-10-CM | POA: Diagnosis not present

## 2019-08-03 DIAGNOSIS — M1990 Unspecified osteoarthritis, unspecified site: Secondary | ICD-10-CM | POA: Diagnosis present

## 2019-08-03 DIAGNOSIS — E8770 Fluid overload, unspecified: Secondary | ICD-10-CM | POA: Diagnosis not present

## 2019-08-03 DIAGNOSIS — I071 Rheumatic tricuspid insufficiency: Secondary | ICD-10-CM | POA: Diagnosis present

## 2019-08-03 DIAGNOSIS — Z885 Allergy status to narcotic agent status: Secondary | ICD-10-CM

## 2019-08-03 DIAGNOSIS — I341 Nonrheumatic mitral (valve) prolapse: Secondary | ICD-10-CM | POA: Diagnosis present

## 2019-08-03 DIAGNOSIS — J9811 Atelectasis: Secondary | ICD-10-CM

## 2019-08-03 DIAGNOSIS — Z8774 Personal history of (corrected) congenital malformations of heart and circulatory system: Secondary | ICD-10-CM

## 2019-08-03 HISTORY — DX: Personal history of (corrected) congenital malformations of heart and circulatory system: Z87.74

## 2019-08-03 HISTORY — DX: Other specified postprocedural states: Z98.890

## 2019-08-03 HISTORY — PX: TEE WITHOUT CARDIOVERSION: SHX5443

## 2019-08-03 HISTORY — DX: Heart failure, unspecified: I50.9

## 2019-08-03 HISTORY — PX: MITRAL VALVE REPAIR: SHX2039

## 2019-08-03 HISTORY — PX: REPAIR OF PATENT FORAMEN OVALE: SHX6064

## 2019-08-03 HISTORY — DX: Chronic diastolic (congestive) heart failure: I50.32

## 2019-08-03 LAB — POCT I-STAT, CHEM 8
BUN: 18 mg/dL (ref 8–23)
BUN: 15 mg/dL (ref 8–23)
BUN: 17 mg/dL (ref 8–23)
BUN: 16 mg/dL (ref 8–23)
BUN: 16 mg/dL (ref 8–23)
BUN: 17 mg/dL (ref 8–23)
BUN: 16 mg/dL (ref 8–23)
Calcium, Ion: 1.25 mmol/L (ref 1.15–1.40)
Calcium, Ion: 0.97 mmol/L — ABNORMAL LOW (ref 1.15–1.40)
Calcium, Ion: 1.18 mmol/L (ref 1.15–1.40)
Calcium, Ion: 1.07 mmol/L — ABNORMAL LOW (ref 1.15–1.40)
Calcium, Ion: 1.04 mmol/L — ABNORMAL LOW (ref 1.15–1.40)
Calcium, Ion: 1.09 mmol/L — ABNORMAL LOW (ref 1.15–1.40)
Calcium, Ion: 1.01 mmol/L — ABNORMAL LOW (ref 1.15–1.40)
Chloride: 107 mmol/L (ref 98–111)
Chloride: 103 mmol/L (ref 98–111)
Chloride: 108 mmol/L (ref 98–111)
Chloride: 104 mmol/L (ref 98–111)
Chloride: 105 mmol/L (ref 98–111)
Chloride: 108 mmol/L (ref 98–111)
Chloride: 107 mmol/L (ref 98–111)
Creatinine, Ser: 0.8 mg/dL (ref 0.44–1.00)
Creatinine, Ser: 0.6 mg/dL (ref 0.44–1.00)
Creatinine, Ser: 0.7 mg/dL (ref 0.44–1.00)
Creatinine, Ser: 0.7 mg/dL (ref 0.44–1.00)
Creatinine, Ser: 0.7 mg/dL (ref 0.44–1.00)
Creatinine, Ser: 0.7 mg/dL (ref 0.44–1.00)
Creatinine, Ser: 0.6 mg/dL (ref 0.44–1.00)
Glucose, Bld: 107 mg/dL — ABNORMAL HIGH (ref 70–99)
Glucose, Bld: 123 mg/dL — ABNORMAL HIGH (ref 70–99)
Glucose, Bld: 126 mg/dL — ABNORMAL HIGH (ref 70–99)
Glucose, Bld: 181 mg/dL — ABNORMAL HIGH (ref 70–99)
Glucose, Bld: 205 mg/dL — ABNORMAL HIGH (ref 70–99)
Glucose, Bld: 208 mg/dL — ABNORMAL HIGH (ref 70–99)
Glucose, Bld: 156 mg/dL — ABNORMAL HIGH (ref 70–99)
HCT: 33 % — ABNORMAL LOW (ref 36.0–46.0)
HCT: 28 % — ABNORMAL LOW (ref 36.0–46.0)
HCT: 32 % — ABNORMAL LOW (ref 36.0–46.0)
HCT: 26 % — ABNORMAL LOW (ref 36.0–46.0)
HCT: 26 % — ABNORMAL LOW (ref 36.0–46.0)
HCT: 27 % — ABNORMAL LOW (ref 36.0–46.0)
HCT: 28 % — ABNORMAL LOW (ref 36.0–46.0)
Hemoglobin: 11.2 g/dL — ABNORMAL LOW (ref 12.0–15.0)
Hemoglobin: 10.9 g/dL — ABNORMAL LOW (ref 12.0–15.0)
Hemoglobin: 9.5 g/dL — ABNORMAL LOW (ref 12.0–15.0)
Hemoglobin: 8.8 g/dL — ABNORMAL LOW (ref 12.0–15.0)
Hemoglobin: 8.8 g/dL — ABNORMAL LOW (ref 12.0–15.0)
Hemoglobin: 9.2 g/dL — ABNORMAL LOW (ref 12.0–15.0)
Hemoglobin: 9.5 g/dL — ABNORMAL LOW (ref 12.0–15.0)
Potassium: 3.9 mmol/L (ref 3.5–5.1)
Potassium: 3.9 mmol/L (ref 3.5–5.1)
Potassium: 4.3 mmol/L (ref 3.5–5.1)
Potassium: 4.8 mmol/L (ref 3.5–5.1)
Potassium: 4.8 mmol/L (ref 3.5–5.1)
Potassium: 5.2 mmol/L — ABNORMAL HIGH (ref 3.5–5.1)
Potassium: 3.7 mmol/L (ref 3.5–5.1)
Sodium: 141 mmol/L (ref 135–145)
Sodium: 141 mmol/L (ref 135–145)
Sodium: 141 mmol/L (ref 135–145)
Sodium: 136 mmol/L (ref 135–145)
Sodium: 137 mmol/L (ref 135–145)
Sodium: 137 mmol/L (ref 135–145)
Sodium: 141 mmol/L (ref 135–145)
TCO2: 28 mmol/L (ref 22–32)
TCO2: 24 mmol/L (ref 22–32)
TCO2: 27 mmol/L (ref 22–32)
TCO2: 25 mmol/L (ref 22–32)
TCO2: 25 mmol/L (ref 22–32)
TCO2: 26 mmol/L (ref 22–32)
TCO2: 23 mmol/L (ref 22–32)

## 2019-08-03 LAB — CBC
HCT: 42.8 % (ref 36.0–46.0)
HCT: 32.8 % — ABNORMAL LOW (ref 36.0–46.0)
HCT: 34.6 % — ABNORMAL LOW (ref 36.0–46.0)
Hemoglobin: 13.8 g/dL (ref 12.0–15.0)
Hemoglobin: 10.7 g/dL — ABNORMAL LOW (ref 12.0–15.0)
Hemoglobin: 11.4 g/dL — ABNORMAL LOW (ref 12.0–15.0)
MCH: 29.7 pg (ref 26.0–34.0)
MCH: 29.9 pg (ref 26.0–34.0)
MCH: 30.3 pg (ref 26.0–34.0)
MCHC: 32.2 g/dL (ref 30.0–36.0)
MCHC: 32.6 g/dL (ref 30.0–36.0)
MCHC: 32.9 g/dL (ref 30.0–36.0)
MCV: 92 fL (ref 80.0–100.0)
MCV: 91.6 fL (ref 80.0–100.0)
MCV: 92 fL (ref 80.0–100.0)
Platelets: 270 10*3/uL (ref 150–400)
Platelets: 155 10*3/uL (ref 150–400)
Platelets: 165 10*3/uL (ref 150–400)
RBC: 4.65 MIL/uL (ref 3.87–5.11)
RBC: 3.58 MIL/uL — ABNORMAL LOW (ref 3.87–5.11)
RBC: 3.76 MIL/uL — ABNORMAL LOW (ref 3.87–5.11)
RDW: 12.4 % (ref 11.5–15.5)
RDW: 12.1 % (ref 11.5–15.5)
RDW: 12.2 % (ref 11.5–15.5)
WBC: 5.7 10*3/uL (ref 4.0–10.5)
WBC: 11.4 10*3/uL — ABNORMAL HIGH (ref 4.0–10.5)
WBC: 11 10*3/uL — ABNORMAL HIGH (ref 4.0–10.5)
nRBC: 0 % (ref 0.0–0.2)
nRBC: 0 % (ref 0.0–0.2)
nRBC: 0 % (ref 0.0–0.2)

## 2019-08-03 LAB — BASIC METABOLIC PANEL
Anion gap: 9 (ref 5–15)
Anion gap: 5 (ref 5–15)
BUN: 19 mg/dL (ref 8–23)
BUN: 12 mg/dL (ref 8–23)
CO2: 25 mmol/L (ref 22–32)
CO2: 24 mmol/L (ref 22–32)
Calcium: 9.3 mg/dL (ref 8.9–10.3)
Calcium: 7 mg/dL — ABNORMAL LOW (ref 8.9–10.3)
Chloride: 108 mmol/L (ref 98–111)
Chloride: 111 mmol/L (ref 98–111)
Creatinine, Ser: 1.25 mg/dL — ABNORMAL HIGH (ref 0.44–1.00)
Creatinine, Ser: 0.79 mg/dL (ref 0.44–1.00)
GFR calc Af Amer: 49 mL/min — ABNORMAL LOW (ref >60)
GFR calc Af Amer: >60 mL/min (ref >60)
GFR calc non Af Amer: 43 mL/min — ABNORMAL LOW (ref >60)
GFR calc non Af Amer: >60 mL/min (ref >60)
Glucose, Bld: 93 mg/dL (ref 70–99)
Glucose, Bld: 140 mg/dL — ABNORMAL HIGH (ref 70–99)
Potassium: 3.8 mmol/L (ref 3.5–5.1)
Potassium: 4.2 mmol/L (ref 3.5–5.1)
Sodium: 142 mmol/L (ref 135–145)
Sodium: 140 mmol/L (ref 135–145)

## 2019-08-03 LAB — POCT I-STAT 7, (LYTES, BLD GAS, ICA,H+H)
Acid-Base Excess: 1 mmol/L (ref 0.0–2.0)
Acid-base deficit: 3 mmol/L — ABNORMAL HIGH (ref 0.0–2.0)
Acid-base deficit: 5 mmol/L — ABNORMAL HIGH (ref 0.0–2.0)
Acid-base deficit: 3 mmol/L — ABNORMAL HIGH (ref 0.0–2.0)
Bicarbonate: 26.4 mmol/L (ref 20.0–28.0)
Bicarbonate: 21.9 mmol/L (ref 20.0–28.0)
Bicarbonate: 22.2 mmol/L (ref 20.0–28.0)
Bicarbonate: 23.3 mmol/L (ref 20.0–28.0)
Calcium, Ion: 0.96 mmol/L — ABNORMAL LOW (ref 1.15–1.40)
Calcium, Ion: 1.01 mmol/L — ABNORMAL LOW (ref 1.15–1.40)
Calcium, Ion: 1.14 mmol/L — ABNORMAL LOW (ref 1.15–1.40)
Calcium, Ion: 1.04 mmol/L — ABNORMAL LOW (ref 1.15–1.40)
HCT: 27 % — ABNORMAL LOW (ref 36.0–46.0)
HCT: 28 % — ABNORMAL LOW (ref 36.0–46.0)
HCT: 33 % — ABNORMAL LOW (ref 36.0–46.0)
HCT: 31 % — ABNORMAL LOW (ref 36.0–46.0)
Hemoglobin: 9.2 g/dL — ABNORMAL LOW (ref 12.0–15.0)
Hemoglobin: 9.5 g/dL — ABNORMAL LOW (ref 12.0–15.0)
Hemoglobin: 11.2 g/dL — ABNORMAL LOW (ref 12.0–15.0)
Hemoglobin: 10.5 g/dL — ABNORMAL LOW (ref 12.0–15.0)
O2 Saturation: 100 %
O2 Saturation: 100 %
O2 Saturation: 97 %
O2 Saturation: 96 %
Patient temperature: 35.5
Patient temperature: 35.4
Potassium: 4.3 mmol/L (ref 3.5–5.1)
Potassium: 3.7 mmol/L (ref 3.5–5.1)
Potassium: 3.7 mmol/L (ref 3.5–5.1)
Potassium: 4.3 mmol/L (ref 3.5–5.1)
Sodium: 142 mmol/L (ref 135–145)
Sodium: 141 mmol/L (ref 135–145)
Sodium: 143 mmol/L (ref 135–145)
Sodium: 143 mmol/L (ref 135–145)
TCO2: 28 mmol/L (ref 22–32)
TCO2: 23 mmol/L (ref 22–32)
TCO2: 24 mmol/L (ref 22–32)
TCO2: 25 mmol/L (ref 22–32)
pCO2 arterial: 43.1 mmHg (ref 32.0–48.0)
pCO2 arterial: 39.2 mmHg (ref 32.0–48.0)
pCO2 arterial: 45.5 mmHg (ref 32.0–48.0)
pCO2 arterial: 42.8 mmHg (ref 32.0–48.0)
pH, Arterial: 7.394 (ref 7.350–7.450)
pH, Arterial: 7.356 (ref 7.350–7.450)
pH, Arterial: 7.288 — ABNORMAL LOW (ref 7.350–7.450)
pH, Arterial: 7.337 — ABNORMAL LOW (ref 7.350–7.450)
pO2, Arterial: 444 mmHg — ABNORMAL HIGH (ref 83.0–108.0)
pO2, Arterial: 356 mmHg — ABNORMAL HIGH (ref 83.0–108.0)
pO2, Arterial: 93 mmHg (ref 83.0–108.0)
pO2, Arterial: 82 mmHg — ABNORMAL LOW (ref 83.0–108.0)

## 2019-08-03 LAB — GLUCOSE, CAPILLARY
Glucose-Capillary: 107 mg/dL — ABNORMAL HIGH (ref 70–99)
Glucose-Capillary: 152 mg/dL — ABNORMAL HIGH (ref 70–99)
Glucose-Capillary: 147 mg/dL — ABNORMAL HIGH (ref 70–99)
Glucose-Capillary: 131 mg/dL — ABNORMAL HIGH (ref 70–99)
Glucose-Capillary: 137 mg/dL — ABNORMAL HIGH (ref 70–99)
Glucose-Capillary: 99 mg/dL (ref 70–99)
Glucose-Capillary: 120 mg/dL — ABNORMAL HIGH (ref 70–99)
Glucose-Capillary: 137 mg/dL — ABNORMAL HIGH (ref 70–99)
Glucose-Capillary: 127 mg/dL — ABNORMAL HIGH (ref 70–99)

## 2019-08-03 LAB — PROTIME-INR
INR: 1.4 — ABNORMAL HIGH (ref 0.8–1.2)
Prothrombin Time: 16.9 seconds — ABNORMAL HIGH (ref 11.4–15.2)

## 2019-08-03 LAB — PLATELET COUNT: Platelets: 175 10*3/uL (ref 150–400)

## 2019-08-03 LAB — APTT: aPTT: 37 seconds — ABNORMAL HIGH (ref 24–36)

## 2019-08-03 LAB — HEMOGLOBIN AND HEMATOCRIT, BLOOD
HCT: 29.9 % — ABNORMAL LOW (ref 36.0–46.0)
Hemoglobin: 9.6 g/dL — ABNORMAL LOW (ref 12.0–15.0)

## 2019-08-03 LAB — MAGNESIUM: Magnesium: 3 mg/dL — ABNORMAL HIGH (ref 1.7–2.4)

## 2019-08-03 SURGERY — REPAIR, MITRAL VALVE, MINIMALLY INVASIVE
Anesthesia: General | Site: Chest | Laterality: Right

## 2019-08-03 MED ORDER — BUPIVACAINE HCL (PF) 0.5 % IJ SOLN
INTRAMUSCULAR | Status: DC | PRN
  Administered 2019-08-03: 30 mL

## 2019-08-03 MED ORDER — CHLORHEXIDINE GLUCONATE 4 % EX LIQD: 30.0000 mL | CUTANEOUS | Status: DC

## 2019-08-03 MED ORDER — SODIUM CHLORIDE 0.9 % IV SOLN: 250.0000 mL | INTRAVENOUS | Status: DC

## 2019-08-03 MED ORDER — 0.9 % SODIUM CHLORIDE (POUR BTL) OPTIME
TOPICAL | Status: DC | PRN
  Administered 2019-08-03: 08:00:00 5000 mL

## 2019-08-03 MED ORDER — LACTATED RINGERS IV SOLN: INTRAVENOUS | Status: DC

## 2019-08-03 MED ORDER — MAGNESIUM SULFATE 4 GM/100ML IV SOLN
4.0000 g | Freq: Once | INTRAVENOUS | Status: AC
  Administered 2019-08-03: 4 g via INTRAVENOUS
  Filled 2019-08-03: qty 100

## 2019-08-03 MED ORDER — ALBUMIN HUMAN 5 % IV SOLN
250.0000 mL | INTRAVENOUS | Status: DC | PRN
  Administered 2019-08-03: 12.5 g via INTRAVENOUS

## 2019-08-03 MED ORDER — METOPROLOL TARTRATE 5 MG/5ML IV SOLN: 2.5000 mg | INTRAVENOUS | Status: DC | PRN

## 2019-08-03 MED ORDER — LEVOFLOXACIN IN D5W 750 MG/150ML IV SOLN
750.0000 mg | INTRAVENOUS | Status: AC
  Administered 2019-08-04: 750 mg via INTRAVENOUS
  Filled 2019-08-03: qty 150

## 2019-08-03 MED ORDER — DOCUSATE SODIUM 100 MG PO CAPS
200.0000 mg | ORAL_CAPSULE | Freq: Every day | ORAL | Status: DC
  Administered 2019-08-04: 09:00:00 200 mg via ORAL
  Filled 2019-08-03: qty 2

## 2019-08-03 MED ORDER — SODIUM CHLORIDE 0.9 % IV SOLN: INTRAVENOUS | Status: DC

## 2019-08-03 MED ORDER — OXYCODONE HCL 5 MG PO TABS
5.0000 mg | ORAL_TABLET | ORAL | Status: DC | PRN
  Filled 2019-08-03: qty 1

## 2019-08-03 MED ORDER — CHLORHEXIDINE GLUCONATE 0.12 % MT SOLN
OROMUCOSAL | Status: AC
  Administered 2019-08-03: 15 mL via OROMUCOSAL
  Filled 2019-08-03: qty 15

## 2019-08-03 MED ORDER — BISACODYL 5 MG PO TBEC
10.0000 mg | DELAYED_RELEASE_TABLET | Freq: Every day | ORAL | Status: DC
  Administered 2019-08-04: 10 mg via ORAL
  Filled 2019-08-03: qty 2

## 2019-08-03 MED ORDER — HEPARIN SODIUM (PORCINE) 1000 UNIT/ML IJ SOLN
INTRAMUSCULAR | Status: DC | PRN
  Administered 2019-08-03: 27000 [IU] via INTRAVENOUS

## 2019-08-03 MED ORDER — SODIUM BICARBONATE 8.4 % IV SOLN
50.0000 meq | Freq: Once | INTRAVENOUS | Status: AC
  Administered 2019-08-03: 50 meq via INTRAVENOUS

## 2019-08-03 MED ORDER — FENTANYL CITRATE (PF) 250 MCG/5ML IJ SOLN
INTRAMUSCULAR | Status: AC
  Filled 2019-08-03: qty 25

## 2019-08-03 MED ORDER — ASPIRIN 81 MG PO CHEW: 324.0000 mg | CHEWABLE_TABLET | Freq: Every day | ORAL | Status: DC

## 2019-08-03 MED ORDER — MORPHINE SULFATE (PF) 2 MG/ML IV SOLN
1.0000 mg | INTRAVENOUS | Status: DC | PRN
  Filled 2019-08-03: qty 1

## 2019-08-03 MED ORDER — PHENYLEPHRINE 40 MCG/ML (10ML) SYRINGE FOR IV PUSH (FOR BLOOD PRESSURE SUPPORT)
PREFILLED_SYRINGE | INTRAVENOUS | Status: AC
  Filled 2019-08-03: qty 10

## 2019-08-03 MED ORDER — ONDANSETRON HCL 4 MG/2ML IJ SOLN
INTRAMUSCULAR | Status: DC | PRN
  Administered 2019-08-03: 4 mg via INTRAVENOUS

## 2019-08-03 MED ORDER — SODIUM CHLORIDE 0.9 % IV SOLN
INTRAVENOUS | Status: DC
  Administered 2019-08-04: 10 mL via INTRAVENOUS

## 2019-08-03 MED ORDER — PHENYLEPHRINE HCL-NACL 20-0.9 MG/250ML-% IV SOLN
0.0000 ug/min | INTRAVENOUS | Status: DC
  Administered 2019-08-03: 13 ug/min via INTRAVENOUS
  Administered 2019-08-04: 10 ug/min via INTRAVENOUS

## 2019-08-03 MED ORDER — SODIUM CHLORIDE 0.9% FLUSH: 3.0000 mL | INTRAVENOUS | Status: DC | PRN

## 2019-08-03 MED ORDER — NITROGLYCERIN IN D5W 200-5 MCG/ML-% IV SOLN: 0.0000 ug/min | INTRAVENOUS | Status: DC

## 2019-08-03 MED ORDER — POTASSIUM CHLORIDE 10 MEQ/50ML IV SOLN
10.0000 meq | INTRAVENOUS | Status: AC
  Administered 2019-08-03 (×3): 10 meq via INTRAVENOUS

## 2019-08-03 MED ORDER — ALBUMIN HUMAN 5 % IV SOLN: INTRAVENOUS | Status: DC | PRN

## 2019-08-03 MED ORDER — BISACODYL 10 MG RE SUPP: 10.0000 mg | Freq: Every day | RECTAL | Status: DC

## 2019-08-03 MED ORDER — DEXTROSE 50 % IV SOLN: 0.0000 mL | INTRAVENOUS | Status: DC | PRN

## 2019-08-03 MED ORDER — METOPROLOL TARTRATE 12.5 MG HALF TABLET: 12.5000 mg | ORAL_TABLET | Freq: Once | ORAL | Status: AC

## 2019-08-03 MED ORDER — ASPIRIN EC 325 MG PO TBEC: 325.0000 mg | DELAYED_RELEASE_TABLET | Freq: Every day | ORAL | Status: DC

## 2019-08-03 MED ORDER — PROPOFOL 10 MG/ML IV BOLUS
INTRAVENOUS | Status: AC
  Filled 2019-08-03: qty 20

## 2019-08-03 MED ORDER — LACTATED RINGERS IV SOLN
500.0000 mL | Freq: Once | INTRAVENOUS | Status: AC | PRN
  Administered 2019-08-04: 250 mL via INTRAVENOUS

## 2019-08-03 MED ORDER — PROTAMINE SULFATE 10 MG/ML IV SOLN
INTRAVENOUS | Status: DC | PRN
  Administered 2019-08-03: 260 mg via INTRAVENOUS
  Administered 2019-08-03: 10 mg via INTRAVENOUS

## 2019-08-03 MED ORDER — ACETAMINOPHEN 160 MG/5ML PO SOLN
650.0000 mg | Freq: Once | ORAL | Status: AC
  Administered 2019-08-04: 650 mg

## 2019-08-03 MED ORDER — CHLORHEXIDINE GLUCONATE CLOTH 2 % EX PADS
6.0000 | MEDICATED_PAD | Freq: Every day | CUTANEOUS | Status: DC
  Administered 2019-08-03 – 2019-08-08 (×5): 6 via TOPICAL

## 2019-08-03 MED ORDER — INSULIN REGULAR(HUMAN) IN NACL 100-0.9 UT/100ML-% IV SOLN
INTRAVENOUS | Status: DC
  Administered 2019-08-03: 0.9 [IU]/h via INTRAVENOUS
  Filled 2019-08-03: qty 100

## 2019-08-03 MED ORDER — LACTATED RINGERS IV SOLN: INTRAVENOUS | Status: DC | PRN

## 2019-08-03 MED ORDER — PANTOPRAZOLE SODIUM 40 MG PO TBEC
40.0000 mg | DELAYED_RELEASE_TABLET | Freq: Every day | ORAL | Status: DC
  Administered 2019-08-05 – 2019-08-06 (×2): 40 mg via ORAL
  Filled 2019-08-03 (×2): qty 1

## 2019-08-03 MED ORDER — DEXAMETHASONE SODIUM PHOSPHATE 10 MG/ML IJ SOLN
INTRAMUSCULAR | Status: AC
  Filled 2019-08-03: qty 1

## 2019-08-03 MED ORDER — VANCOMYCIN HCL IN DEXTROSE 1-5 GM/200ML-% IV SOLN
1000.0000 mg | Freq: Once | INTRAVENOUS | Status: AC
  Administered 2019-08-03: 1000 mg via INTRAVENOUS
  Filled 2019-08-03: qty 200

## 2019-08-03 MED ORDER — ACETAMINOPHEN 650 MG RE SUPP: 650.0000 mg | Freq: Once | RECTAL | Status: AC

## 2019-08-03 MED ORDER — FENTANYL CITRATE (PF) 250 MCG/5ML IJ SOLN
INTRAMUSCULAR | Status: DC | PRN
  Administered 2019-08-03 (×6): 50 ug via INTRAVENOUS
  Administered 2019-08-03 (×2): 100 ug via INTRAVENOUS
  Administered 2019-08-03 (×3): 50 ug via INTRAVENOUS
  Administered 2019-08-03 (×2): 100 ug via INTRAVENOUS

## 2019-08-03 MED ORDER — SODIUM CHLORIDE 0.45 % IV SOLN
INTRAVENOUS | Status: DC | PRN
  Administered 2019-08-03 (×2): 20 mL/h via INTRAVENOUS

## 2019-08-03 MED ORDER — SODIUM CHLORIDE 0.9% FLUSH
3.0000 mL | Freq: Two times a day (BID) | INTRAVENOUS | Status: DC
  Administered 2019-08-04 – 2019-08-05 (×4): 3 mL via INTRAVENOUS

## 2019-08-03 MED ORDER — ACETAMINOPHEN 160 MG/5ML PO SOLN: 1000.0000 mg | Freq: Four times a day (QID) | ORAL | Status: DC

## 2019-08-03 MED ORDER — DEXAMETHASONE SODIUM PHOSPHATE 10 MG/ML IJ SOLN
INTRAMUSCULAR | Status: DC | PRN
  Administered 2019-08-03: 10 mg via INTRAVENOUS

## 2019-08-03 MED ORDER — TRAMADOL HCL 50 MG PO TABS
50.0000 mg | ORAL_TABLET | ORAL | Status: DC | PRN
  Administered 2019-08-04 – 2019-08-05 (×2): 100 mg via ORAL
  Administered 2019-08-05 – 2019-08-06 (×3): 50 mg via ORAL
  Filled 2019-08-03: qty 2
  Filled 2019-08-03 (×3): qty 1
  Filled 2019-08-03: qty 2
  Filled 2019-08-03: qty 1

## 2019-08-03 MED ORDER — BUPIVACAINE HCL (PF) 0.5 % IJ SOLN
INTRAMUSCULAR | Status: AC
  Filled 2019-08-03: qty 30

## 2019-08-03 MED ORDER — MIDAZOLAM HCL 2 MG/2ML IJ SOLN: 2.0000 mg | INTRAMUSCULAR | Status: DC | PRN

## 2019-08-03 MED ORDER — MILRINONE LACTATE IN DEXTROSE 20-5 MG/100ML-% IV SOLN
0.2500 ug/kg/min | INTRAVENOUS | Status: DC
  Administered 2019-08-03 – 2019-08-04 (×2): 0.25 ug/kg/min via INTRAVENOUS
  Filled 2019-08-03: qty 100

## 2019-08-03 MED ORDER — ROCURONIUM BROMIDE 10 MG/ML (PF) SYRINGE
PREFILLED_SYRINGE | INTRAVENOUS | Status: DC | PRN
  Administered 2019-08-03 (×2): 50 mg via INTRAVENOUS
  Administered 2019-08-03: 30 mg via INTRAVENOUS
  Administered 2019-08-03: 10 mg via INTRAVENOUS

## 2019-08-03 MED ORDER — FENTANYL CITRATE (PF) 250 MCG/5ML IJ SOLN
INTRAMUSCULAR | Status: AC
  Filled 2019-08-03: qty 5

## 2019-08-03 MED ORDER — PROPOFOL 10 MG/ML IV BOLUS
INTRAVENOUS | Status: DC | PRN
  Administered 2019-08-03: 130 mg via INTRAVENOUS

## 2019-08-03 MED ORDER — ROCURONIUM BROMIDE 10 MG/ML (PF) SYRINGE
PREFILLED_SYRINGE | INTRAVENOUS | Status: AC
  Filled 2019-08-03: qty 20

## 2019-08-03 MED ORDER — BUPIVACAINE LIPOSOME 1.3 % IJ SUSP
20.0000 mL | INTRAMUSCULAR | Status: AC
  Administered 2019-08-03: 20 mL
  Filled 2019-08-03: qty 20

## 2019-08-03 MED ORDER — CHLORHEXIDINE GLUCONATE 0.12 % MT SOLN: 15.0000 mL | Freq: Once | OROMUCOSAL | Status: AC

## 2019-08-03 MED ORDER — METOPROLOL TARTRATE 12.5 MG HALF TABLET
ORAL_TABLET | ORAL | Status: AC
  Administered 2019-08-03: 12.5 mg via ORAL
  Filled 2019-08-03: qty 1

## 2019-08-03 MED ORDER — MIDAZOLAM HCL 5 MG/5ML IJ SOLN
INTRAMUSCULAR | Status: DC | PRN
  Administered 2019-08-03: 1 mg via INTRAVENOUS
  Administered 2019-08-03 (×2): 2 mg via INTRAVENOUS
  Administered 2019-08-03: 1 mg via INTRAVENOUS

## 2019-08-03 MED ORDER — ONDANSETRON HCL 4 MG/2ML IJ SOLN
INTRAMUSCULAR | Status: AC
  Filled 2019-08-03: qty 2

## 2019-08-03 MED ORDER — FAMOTIDINE IN NACL 20-0.9 MG/50ML-% IV SOLN
20.0000 mg | Freq: Two times a day (BID) | INTRAVENOUS | Status: AC
  Administered 2019-08-03 (×2): 20 mg via INTRAVENOUS
  Filled 2019-08-03: qty 50

## 2019-08-03 MED ORDER — ONDANSETRON HCL 4 MG/2ML IJ SOLN
4.0000 mg | Freq: Four times a day (QID) | INTRAMUSCULAR | Status: DC | PRN
  Administered 2019-08-03 – 2019-08-04 (×2): 4 mg via INTRAVENOUS
  Filled 2019-08-03 (×3): qty 2

## 2019-08-03 MED ORDER — DEXMEDETOMIDINE HCL IN NACL 400 MCG/100ML IV SOLN
0.0000 ug/kg/h | INTRAVENOUS | Status: DC
  Administered 2019-08-03: 0.1 ug/kg/h via INTRAVENOUS
  Filled 2019-08-03: qty 100

## 2019-08-03 MED ORDER — MIDAZOLAM HCL (PF) 10 MG/2ML IJ SOLN
INTRAMUSCULAR | Status: AC
  Filled 2019-08-03: qty 2

## 2019-08-03 MED ORDER — CHLORHEXIDINE GLUCONATE 0.12 % MT SOLN: 15.0000 mL | OROMUCOSAL | Status: AC

## 2019-08-03 MED ORDER — PHENYLEPHRINE HCL (PRESSORS) 10 MG/ML IV SOLN
INTRAVENOUS | Status: DC | PRN
  Administered 2019-08-03: 120 ug via INTRAVENOUS

## 2019-08-03 MED ORDER — SUGAMMADEX SODIUM 200 MG/2ML IV SOLN
INTRAVENOUS | Status: DC | PRN
  Administered 2019-08-03: 200 mg via INTRAVENOUS

## 2019-08-03 MED ORDER — ACETAMINOPHEN 500 MG PO TABS
1000.0000 mg | ORAL_TABLET | Freq: Four times a day (QID) | ORAL | Status: DC
  Administered 2019-08-04 – 2019-08-06 (×9): 1000 mg via ORAL
  Filled 2019-08-03 (×9): qty 2

## 2019-08-03 SURGICAL SUPPLY — 138 items
ADAPTER CARDIO PERF ANTE/RETRO (ADAPTER) ×8 IMPLANT
ADH SKN CLS APL DERMABOND .7 (GAUZE/BANDAGES/DRESSINGS) ×3
ADPR PRFSN 84XANTGRD RTRGD (ADAPTER) ×6
ATTRACTOMAT 16X20 MAGNETIC DRP (DRAPES) ×4 IMPLANT
BAG DECANTER FOR FLEXI CONT (MISCELLANEOUS) ×9 IMPLANT
BLADE CLIPPER SURG (BLADE) ×6 IMPLANT
BLADE STERNUM SYSTEM 6 (BLADE) ×2 IMPLANT
BLADE SURG 11 STRL SS (BLADE) ×7 IMPLANT
CANISTER SUCT 3000ML PPV (MISCELLANEOUS) ×13 IMPLANT
CANNULA FEM VENOUS REMOTE 22FR (CANNULA) ×1 IMPLANT
CANNULA FEMORAL ART 14 SM (MISCELLANEOUS) ×5 IMPLANT
CANNULA GUNDRY RCSP 15FR (MISCELLANEOUS) ×7 IMPLANT
CANNULA OPTISITE PERFUSION 16F (CANNULA) IMPLANT
CANNULA OPTISITE PERFUSION 18F (CANNULA) ×2 IMPLANT
CANNULA SUMP PERICARDIAL (CANNULA) ×14 IMPLANT
CATH CPB KIT OWEN (MISCELLANEOUS) IMPLANT
CATH KIT ON-Q SILVERSOAK 5 (CATHETERS) IMPLANT
CATH KIT ON-Q SILVERSOAK 5IN (CATHETERS) IMPLANT
CATH ROBINSON RED A/P 18FR (CATHETERS) ×3 IMPLANT
CELLS DAT CNTRL 66122 CELL SVR (MISCELLANEOUS) ×3 IMPLANT
CONN ST 1/4X3/8  BEN (MISCELLANEOUS) ×2
CONN ST 1/4X3/8 BEN (MISCELLANEOUS) ×10 IMPLANT
CONNECTOR 1/2X3/8X1/2 3 WAY (MISCELLANEOUS) ×1
CONNECTOR 1/2X3/8X1/2 3WAY (MISCELLANEOUS) ×5 IMPLANT
CONT SPEC 4OZ CLIKSEAL STRL BL (MISCELLANEOUS) ×6 IMPLANT
COVER BACK TABLE 24X17X13 BIG (DRAPES) ×5 IMPLANT
COVER PROBE W GEL 5X96 (DRAPES) ×6 IMPLANT
DERMABOND ADVANCED (GAUZE/BANDAGES/DRESSINGS) ×1
DERMABOND ADVANCED .7 DNX12 (GAUZE/BANDAGES/DRESSINGS) ×9 IMPLANT
DEVICE CLOSURE PERCLS PRGLD 6F (VASCULAR PRODUCTS) ×8 IMPLANT
DEVICE SUT CK QUICK LOAD INDV (Prosthesis & Implant Heart) ×3 IMPLANT
DEVICE SUT CK QUICK LOAD MINI (Prosthesis & Implant Heart) ×4 IMPLANT
DEVICE TROCAR PUNCTURE CLOSURE (ENDOMECHANICALS) ×12 IMPLANT
DRAIN CHANNEL 28F RND 3/8 FF (WOUND CARE) ×8 IMPLANT
DRAPE C-ARM 42X72 X-RAY (DRAPES) ×7 IMPLANT
DRAPE CV SPLIT W-CLR ANES SCRN (DRAPES) ×6 IMPLANT
DRAPE INCISE IOBAN 66X45 STRL (DRAPES) ×17 IMPLANT
DRAPE PERI GROIN 82X75IN TIB (DRAPES) ×7 IMPLANT
DRAPE SLUSH MACHINE 52X66 (DRAPES) ×1 IMPLANT
DRAPE SLUSH/WARMER DISC (DRAPES) ×4 IMPLANT
DRSG AQUACEL AG ADV 3.5X 6 (GAUZE/BANDAGES/DRESSINGS) ×2 IMPLANT
DRSG COVADERM 4X8 (GAUZE/BANDAGES/DRESSINGS) ×6 IMPLANT
ELECT BLADE 6.5 EXT (BLADE) ×7 IMPLANT
ELECT REM PT RETURN 9FT ADLT (ELECTROSURGICAL) ×8
ELECTRODE REM PT RTRN 9FT ADLT (ELECTROSURGICAL) ×10 IMPLANT
FELT TEFLON 1X6 (MISCELLANEOUS) ×13 IMPLANT
FEMORAL VENOUS CANN RAP (CANNULA) IMPLANT
GAUZE SPONGE 4X4 12PLY STRL LF (GAUZE/BANDAGES/DRESSINGS) ×6 IMPLANT
GLOVE BIO SURGEON STRL SZ 6 (GLOVE) ×1 IMPLANT
GLOVE BIO SURGEON STRL SZ 6.5 (GLOVE) ×15 IMPLANT
GLOVE BIOGEL PI IND STRL 6 (GLOVE) ×2 IMPLANT
GLOVE BIOGEL PI IND STRL 7.5 (GLOVE) ×3 IMPLANT
GLOVE BIOGEL PI INDICATOR 6 (GLOVE) ×1
GLOVE BIOGEL PI INDICATOR 7.5 (GLOVE) ×1
GLOVE ORTHO TXT STRL SZ7.5 (GLOVE) ×12 IMPLANT
GLOVE SURG SS PI 7.5 STRL IVOR (GLOVE) ×4 IMPLANT
GOWN STRL REUS W/ TWL LRG LVL3 (GOWN DISPOSABLE) ×24 IMPLANT
GOWN STRL REUS W/TWL LRG LVL3 (GOWN DISPOSABLE) ×32
GRASPER SUT TROCAR 14GX15 (MISCELLANEOUS) ×4 IMPLANT
KIT BASIN OR (CUSTOM PROCEDURE TRAY) ×7 IMPLANT
KIT DILATOR VASC 18G NDL (KITS) ×7 IMPLANT
KIT DRAINAGE VACCUM ASSIST (KITS) ×2 IMPLANT
KIT SUCTION CATH 14FR (SUCTIONS) ×6 IMPLANT
KIT SUT CK MINI COMBO 4X17 (Prosthesis & Implant Heart) ×2 IMPLANT
KIT TURNOVER KIT B (KITS) ×5 IMPLANT
LEAD PACING MYOCARDI (MISCELLANEOUS) ×7 IMPLANT
LINE VENT (MISCELLANEOUS) ×2 IMPLANT
NDL AORTIC ROOT 14G 7F (CATHETERS) ×4 IMPLANT
NEEDLE AORTIC ROOT 14G 7F (CATHETERS) ×4 IMPLANT
NS IRRIG 1000ML POUR BTL (IV SOLUTION) ×15 IMPLANT
PACK E MIN INVASIVE VALVE (SUTURE) ×6 IMPLANT
PACK OPEN HEART (CUSTOM PROCEDURE TRAY) ×6 IMPLANT
PAD ARMBOARD 7.5X6 YLW CONV (MISCELLANEOUS) ×12 IMPLANT
PAD ELECT DEFIB RADIOL ZOLL (MISCELLANEOUS) ×7 IMPLANT
PERCLOSE PROGLIDE 6F (VASCULAR PRODUCTS) ×16
POSITIONER HEAD DONUT 9IN (MISCELLANEOUS) ×6 IMPLANT
RETRACTOR WND ALEXIS 18 MED (MISCELLANEOUS) ×3 IMPLANT
RING MITRAL MEMO 4D 38 (Prosthesis & Implant Heart) ×1 IMPLANT
RTRCTR WOUND ALEXIS 18CM MED (MISCELLANEOUS) ×4
SET CANNULATION TOURNIQUET (MISCELLANEOUS) ×7 IMPLANT
SET CARDIOPLEGIA MPS 5001102 (MISCELLANEOUS) ×1 IMPLANT
SET IRRIG TUBING LAPAROSCOPIC (IRRIGATION / IRRIGATOR) ×5 IMPLANT
SET MICROPUNCTURE 5F STIFF (MISCELLANEOUS) ×2 IMPLANT
SHEATH PINNACLE 8F 10CM (SHEATH) ×3 IMPLANT
SIZER CHORD-X CHORDAL CXCS (SIZER) ×2 IMPLANT
SOL ANTI FOG 6CC (MISCELLANEOUS) ×6 IMPLANT
SOLUTION ANTI FOG 6CC (MISCELLANEOUS) ×1
SUT BONE WAX W31G (SUTURE) ×6 IMPLANT
SUT ETHIBOND (SUTURE) ×4 IMPLANT
SUT ETHIBOND 2 0 SH (SUTURE) ×1 IMPLANT
SUT ETHIBOND 2 0 V4 (SUTURE) IMPLANT
SUT ETHIBOND 2 0V4 GREEN (SUTURE) IMPLANT
SUT ETHIBOND 2-0 RB-1 WHT (SUTURE) ×6 IMPLANT
SUT ETHIBOND 4 0 TF (SUTURE) IMPLANT
SUT ETHIBOND 5 0 C 1 30 (SUTURE) IMPLANT
SUT ETHIBOND NAB MH 2-0 36IN (SUTURE) ×4 IMPLANT
SUT ETHIBOND X763 2 0 SH 1 (SUTURE) ×8 IMPLANT
SUT GORETEX 6.0 TH-9 30 IN (SUTURE) IMPLANT
SUT GORETEX CV 4 TH 22 36 (SUTURE) ×4 IMPLANT
SUT GORETEX CV-5THC-13 36IN (SUTURE) IMPLANT
SUT GORETEX CV4 TH-18 (SUTURE) ×8 IMPLANT
SUT GORETEX TH-18 36 INCH (SUTURE) IMPLANT
SUT MNCRL AB 3-0 PS2 18 (SUTURE) ×8 IMPLANT
SUT PROLENE 3 0 SH DA (SUTURE) ×10 IMPLANT
SUT PROLENE 3 0 SH1 36 (SUTURE) ×32 IMPLANT
SUT PROLENE 4 0 RB 1 (SUTURE) ×24
SUT PROLENE 4 0 SH DA (SUTURE) ×2 IMPLANT
SUT PROLENE 4-0 RB1 .5 CRCL 36 (SUTURE) ×10 IMPLANT
SUT PROLENE 5 0 C 1 36 (SUTURE) ×4 IMPLANT
SUT PROLENE 6 0 C 1 30 (SUTURE) ×10 IMPLANT
SUT PTFE CHORD X 16MM (SUTURE) ×4 IMPLANT
SUT SILK  1 MH (SUTURE) ×7
SUT SILK 1 MH (SUTURE) ×21 IMPLANT
SUT SILK 1 TIES 10X30 (SUTURE) ×4 IMPLANT
SUT SILK 2 0 SH CR/8 (SUTURE) ×4 IMPLANT
SUT SILK 2 0 TIES 10X30 (SUTURE) ×4 IMPLANT
SUT SILK 2 0SH CR/8 30 (SUTURE) ×8 IMPLANT
SUT SILK 3 0 (SUTURE) ×4
SUT SILK 3 0 SH CR/8 (SUTURE) ×4 IMPLANT
SUT SILK 3 0SH CR/8 30 (SUTURE) ×4 IMPLANT
SUT SILK 3-0 18XBRD TIE 12 (SUTURE) ×3 IMPLANT
SUT TEM PAC WIRE 2 0 SH (SUTURE) ×8 IMPLANT
SUT VIC AB 2-0 CTX 36 (SUTURE) ×8 IMPLANT
SUT VIC AB 2-0 UR6 27 (SUTURE) ×8 IMPLANT
SUT VIC AB 3-0 SH 8-18 (SUTURE) ×12 IMPLANT
SUT VICRYL 2 TP 1 (SUTURE) ×4 IMPLANT
SYR 10ML LL (SYRINGE) ×5 IMPLANT
SYSTEM SAHARA CHEST DRAIN ATS (WOUND CARE) ×5 IMPLANT
TOWEL GREEN STERILE (TOWEL DISPOSABLE) ×6 IMPLANT
TOWEL GREEN STERILE FF (TOWEL DISPOSABLE) ×6 IMPLANT
TRAY FOLEY SLVR 16FR TEMP STAT (SET/KITS/TRAYS/PACK) ×6 IMPLANT
TROCAR XCEL BLADELESS 5X75MML (TROCAR) ×7 IMPLANT
TROCAR XCEL NON-BLD 11X100MML (ENDOMECHANICALS) ×12 IMPLANT
TUBE SUCT INTRACARD DLP 20F (MISCELLANEOUS) ×6 IMPLANT
TUNNELER SHEATH ON-Q 11GX8 DSP (PAIN MANAGEMENT) IMPLANT
UNDERPAD 30X30 (UNDERPADS AND DIAPERS) ×6 IMPLANT
WATER STERILE IRR 1000ML POUR (IV SOLUTION) ×4 IMPLANT
WIRE EMERALD 3MM-J .035X150CM (WIRE) ×5 IMPLANT

## 2019-08-03 NOTE — Progress Notes (Signed)
  Echocardiogram Echocardiogram Transesophageal has been performed.  Bobbye Charleston 08/03/2019, 9:19 AM

## 2019-08-03 NOTE — Progress Notes (Signed)
Patient ID: Anne Hogan, female   DOB: [birth year], 74 y.o.   MRN: [id number]   TCTS Evening Rounds:   Hemodynamically stable  CI = 2.2  Extubated in OR, awake and alert  Urine output good  CT output low  CBC    Component Value Date/Time   WBC 11.4 (H) 08/03/2019 1432   RBC 3.58 (L) 08/03/2019 1432   HGB 10.5 (L) 08/03/2019 1550   HGB 13.5 05/12/2019 1243   HCT 31.0 (L) 08/03/2019 1550   HCT 40.2 05/12/2019 1243   PLT 155 08/03/2019 1432   PLT 285 05/12/2019 1243   MCV 91.6 08/03/2019 1432   MCV 88 05/12/2019 1243   MCH 29.9 08/03/2019 1432   MCHC 32.6 08/03/2019 1432   RDW 12.1 08/03/2019 1432   RDW 12.5 05/12/2019 1243   LYMPHSABS 1.0 04/03/2019 0948   MONOABS 0.6 04/03/2019 0948   EOSABS 0.4 04/03/2019 0948   BASOSABS 0.1 04/03/2019 0948     BMET    Component Value Date/Time   NA 143 08/03/2019 1550   NA 140 05/12/2019 1243   K 4.3 08/03/2019 1550   CL 107 08/03/2019 1312   CO2 25 08/03/2019 0555   GLUCOSE 156 (H) 08/03/2019 1312   BUN 16 08/03/2019 1312   BUN 14 05/12/2019 1243   CREATININE 0.60 08/03/2019 1312   CREATININE 1.05 (H) 07/19/2019 1206   CALCIUM 9.3 08/03/2019 0555   GFRNONAA 43 (L) 08/03/2019 0555   GFRAA 49 (L) 08/03/2019 0555     A/P:  Stable postop course. Continue current plans

## 2019-08-03 NOTE — Anesthesia Procedure Notes (Signed)
Arterial Line Insertion Start/End1/14/2021 7:03 AM, 08/03/2019 7:03 AM Performed by: Lavell Luster, CRNA  Patient location: Pre-op. Preanesthetic checklist: patient identified, IV checked, site marked, risks and benefits discussed, surgical consent, monitors and equipment checked, pre-op evaluation, timeout performed and anesthesia consent Lidocaine 1% used for infiltration Left, radial was placed Catheter size: 20 Fr Hand hygiene performed  and maximum sterile barriers used   Attempts: 1 Procedure performed without using ultrasound guided technique. Following insertion, dressing applied. Post procedure assessment: normal and unchanged

## 2019-08-03 NOTE — Anesthesia Procedure Notes (Signed)
Central Venous Catheter Insertion Performed by: Roberts Gaudy, MD, anesthesiologist Start/End1/14/2021 6:55 AM, 08/03/2019 7:05 AM Patient location: Pre-op. Preanesthetic checklist: patient identified, IV checked, site marked, risks and benefits discussed, surgical consent, monitors and equipment checked, pre-op evaluation, timeout performed and anesthesia consent Lidocaine 1% used for infiltration and patient sedated Hand hygiene performed  and maximum sterile barriers used  Catheter size: 8.5 Fr Sheath introducer Procedure performed using ultrasound guided technique. Ultrasound Notes:anatomy identified, needle tip was noted to be adjacent to the nerve/plexus identified, no ultrasound evidence of intravascular and/or intraneural injection and image(s) printed for medical record Attempts: 1 Following insertion, line sutured and dressing applied. Post procedure assessment: blood return through all ports, free fluid flow and no air  Patient tolerated the procedure well with no immediate complications.

## 2019-08-03 NOTE — Op Note (Signed)
CARDIOTHORACIC SURGERY OPERATIVE NOTE  Date of Procedure:  08/03/2019  Preoperative Diagnosis: Severe Mitral Regurgitation  Postoperative Diagnosis: Same  Procedure:    Minimally-Invasive Mitral Valve Repair  Complex valvuloplasty including artificial Gore-tex neochord placement x12  Sorin Memo 4D Ring Annuloplasty (size 70mm, catalog # [id number], serial # [id number])  Closure patent foramen ovale    Surgeon: Valentina Gu. Roxy Manns, MD  Assistant: Nani Skillern, PA-C  Anesthesia: Roberts Gaudy, MD  Operative Findings:  Fibroelastic deficiency type myxomatous degenerative disease  Multiple flail segments of posterior leaflet with ruptured primary chordae tendinae  Type II dysfunction with severe mitral regurgitation  Normal left ventricular systolic function  Patent foramen ovale  Mild aortic insufficiency  Mild tricuspid regurgitation  No residual mitral regurgitation after successful valve repair                   BRIEF CLINICAL NOTE AND INDICATIONS FOR SURGERY  Patient is 74 year old moderately obese female with history of mitral valve prolapse, mitral regurgitation, and palpitations who has been referred for surgical consultation to discuss treatment options for management of severe symptomatic primary mitral regurgitation.  Patient states that she has been told that she had mitral valve prolapse for many years.  Several other family members have had mitral valve prolapse and valvular heart disease.  She has been followed for the last several years by Dr. Curt Bears because of history of palpitations.  Previous echocardiograms have documented the presence of normal left ventricular systolic function with mitral valve prolapse and moderate to severe mitral regurgitation.  Long-term event monitor in 2019 revealed sinus rhythm with sinus tachycardia and rare PVCs as well as 8 brief runs of supraventricular tachycardia.  Over the past year the patient has noticed  increasing symptoms of exertional shortness of breath, particularly since August of this year.  Transesophageal echocardiogram performed May 26, 2019 revealed normal left ventricular systolic function with mitral valve prolapse including an obvious flail segment of the middle scallop of the posterior leaflet and severe prolapse of the P3 segment of the posterior leaflet causing severe mitral regurgitation.  There was additionally moderate to severe tricuspid regurgitation.  Left and right heart catheterization was performed revealing normal coronary artery anatomy and no significant coronary artery disease.  There was mild pulmonary hypertension with elevated pulmonary capillary wedge pressure consistent with severe mitral regurgitation.  Cardiothoracic surgical consultation was requested.  The patient has been seen in consultation and counseled at length regarding the indications, risks and potential benefits of surgery.  All questions have been answered, and the patient provides full informed consent for the operation as described.    DETAILS OF THE OPERATIVE PROCEDURE  Preparation:  The patient is brought to the operating room on the above mentioned date and central monitoring was established by the anesthesia team including placement of Swan-Ganz catheter through the left internal jugular vein.  A radial arterial line is placed. The patient is placed in the supine position on the operating table.  Intravenous antibiotics are administered. General endotracheal anesthesia is induced uneventfully. The patient is initially intubated using a dual lumen endotracheal tube.  A Foley catheter is placed.  Baseline transesophageal echocardiogram was performed.  Findings were notable for myxomatous degenerative disease of the mitral valve with multiple ruptured primary chordae tendinae from the posterior leaflet with at least 2 flail segments causing severe mitral regurgitation.  The jet of regurgitation was  eccentric and directed anteriorly.  The left atrium was dilated.  There was normal left ventricular  systolic function.  There was normal right ventricular size and systolic function.  The tricuspid annulus measured 3.8 cm.  There was mild tricuspid regurgitation.  There was a patent foramen ovale.  There was mild central aortic insufficiency.  A soft roll is placed behind the patient's left scapula and the neck gently extended and turned to the left.   The patient's right neck, chest, abdomen, both groins, and both lower extremities are prepared and draped in a sterile manner. A time out procedure is performed.   Percutaneous Vascular Access:  Percutaneous arterial and venous access were obtained on the right side.  Using ultrasound guidance the right common femoral vein was cannulated using the Seldinger technique a pair of Perclose vascular closure devises were placed at opposing 30 degree angles in the femoral vein, after which time an 8 French sheath inserted.  The right common femoral artery was cannulated using a micropuncture wire and sheath.  A pair of Perclose vascular closure devices were placed at opposing 30 degree angles in the femoral artery, and a 8 French sheath inserted.  The right internal jugular vein was cannulated  using ultrasound guidance and an 8 French sheath inserted.     Surgical Approach:  A right miniature anterolateral thoracotomy incision is performed. The incision is placed just lateral to and superior to the right nipple. The pectoralis major muscle is retracted medially and completely preserved. The right pleural space is entered through the 3rd intercostal space. A soft tissue retractor is placed.  Two 11 mm ports are placed through separate stab incisions inferiorly. The right pleural space is insufflated continuously with carbon dioxide gas through the posterior port during the remainder of the operation.  A pledgeted sutures placed through the dome of the right  hemidiaphragm and retracted inferiorly to facilitate exposure.  A longitudinal incision is made in the pericardium 3 cm anterior to the phrenic nerve and silk traction sutures are placed on either side of the incision for exposure.   Extracorporeal Cardiopulmonary Bypass and Myocardial Protection:  The patient was heparinized systemically.  The right common femoral vein is cannulated through the venous sheath and a guidewire advanced into the right atrium using TEE guidance.  The femoral vein cannulated using a 22 Fr long femoral venous cannula.  The right common femoral artery is cannulated through the arterial sheath and a guidewire advanced into the descending thoracic aorta using TEE guidance.  Femoral artery is cannulated with a 18 French femoral arterial cannula.  The right internal jugular vein is cannulated through the venous sheath and a guidewire advanced into the right atrium.  The internal jugular vein is cannulated using a 14 French pediatric femoral venous cannula.   Adequate heparinization is verified.   The entire pre-bypass portion of the operation was notable for stable hemodynamics.  Cardiopulmonary bypass was begun.  Vacuum assist venous drainage is utilized. The incision in the pericardium is extended in both directions. Venous drainage and exposure are notably excellent. A retrograde cardioplegia cannula is placed through the right atrium into the coronary sinus using transesophageal echocardiogram guidance.  An antegrade cardioplegia cannula is placed in the ascending aorta.    The patient is cooled to 32C systemic temperature.  The aortic cross clamp is applied and cardioplegia is delivered initially in an antegrade fashion through the aortic root using modified [place] cold blood cardioplegia ([place] blood cardioplegia protocol).   The initial cardioplegic arrest is rapid with early diastolic arrest.  Repeat doses of cardioplegia are administered  at 90 minutes and every 30  minutes thereafter through the coronary sinus catheter in order to maintain completely flat electrocardiogram.  Myocardial protection was felt to be excellent.   Closure of Patent Foramen Ovale:  A left atriotomy incision was performed through the interatrial groove and extended partially across the back wall of the left atrium after opening the oblique sinus inferiorly.  On inspection of the left atrial surface of the fossa ovalis there is an obvious patent foramen ovale.  The patent foramen ovale is closed using a 2 layer closure of running 4-0 Prolene suture.   Mitral Valve Repair:  The mitral valve is exposed using a self-retaining retractor.  The mitral valve was inspected and notable for fibroelastic deficiency type myxomatous degenerative disease.  The mitral valve was relatively large in size but did not display characteristics consistent with Barlow's type disease.  There were a total of 3 separate flail segments involving the posterior leaflet of the mitral valve including 2 separate areas of the P2 scallop on either side of midline and a third small area involving the P3 scallop.  Each had ruptured primary chordae tendinae.  The remainder of the leaflets were normal.  There was mild redundant tissue involving the P2 scallop.  There was no calcification in the mitral annulus.  Interrupted 2-0 Ethibond horizontal mattress sutures are placed circumferentially around the entire mitral valve annulus. The sutures will ultimately be utilized for ring annuloplasty, and at this juncture there are utilized to suspend the valve symmetrically.  Valve repair is performed using premeasured Gore-Tex loops without any leaflet resection.  Artificial neochord placement was performed using Chord-X multi-strand CV-4 Goretex pre-measured loops.  The appropriate cord length was measured from corresponding normal length primary cords from the P1 segment of the posterior leaflet. The papillary muscle suture of a  Chord-X multi-strand suture was placed through the head of the posterior papillary muscle in a horizontal mattress fashion and tied over Teflon felt pledgets. Each of the three pre-measured loops were then reimplanted into the free margin of the flail P2 and P3 segments of the posterior leaflet on the posterior side of midline. A second papillary muscle suture of the Chord-X multi-strand suture was placed through the head of the anterior papillary muscle in a horizontal mattress fashion and tied over Teflon felt pledgets. Each of the three pre-measured loops were then reimplanted into the free margin of the P1 and P2 segments of the posterior leaflet on the anterior side of midline.     The valve was tested with saline and appeared competent even without ring annuloplasty complete. The valve was sized to a 38 mm annuloplasty ring, based upon the transverse distance between the left and right commissures and the height of the anterior leaflet, corresponding to a size just slightly larger than the overall surface area of the anterior leaflet.  A Sorin Memo 4D annuloplasty ring (size 8mm, catalog #[id number], serial [id number]) was secured in place uneventfully. All ring sutures were secured using a Cor-knot device.    The valve was tested with saline and appeared competent. There is no residual leak. There was a broad, symmetrical line of coaptation of the anterior and posterior leaflet which was confirmed using the blue ink test.  Rewarming is begun.   Procedure Completion:  The atriotomy was closed using a 2-layer closure of running 3-0 Prolene suture after placing a sump drain across the mitral valve to serve as a left ventricular vent.  One final dose of warm  retrograde "reanimation dose" cardioplegia was administered retrograde through the coronary sinus catheter while all air was evacuated through the aortic root.  The aortic cross clamp was removed after a total cross clamp time of 107 minutes.  Epicardial  pacing wires are fixed to the inferior wall of the right ventricule and to the right atrial appendage. The patient is rewarmed to 37C temperature. The left ventricular vent and antegrade cardioplegia cannula are removed. The patient is weaned and disconnected from cardiopulmonary bypass.  The patient's rhythm at separation from bypass was atrial paced.  The patient was weaned from bypass without any inotropic support. Total cardiopulmonary bypass time for the operation was 173 minutes.  Low-dose milrinone infusion was added due to mildly sluggish left ventricular systolic function after separation from bypass.  Followup transesophageal echocardiogram performed after separation from bypass revealed a well-seated annuloplasty ring in the mitral position with a normal functioning mitral valve. There was no residual leak.  Left ventricular function was mildly reduced in comparison with preoperatively.  The mean gradient across the mitral valve was estimated to be 2 mmHg.  The femoral arterial and venous cannulas were removed and all Perclose sutures secured.  Manual pressure was maintained while Protamine was administered.  The right internal jugular cannula was removed and manual pressure held on the neck and groin for 15 minutes.  Single lung ventilation was begun. The atriotomy closure was inspected for hemostasis. The pericardial sac was drained using a 28 French Bard drain placed through the anterior port incision.  The right pleural space is irrigated with saline solution and inspected for hemostasis.   Exparel liposomal bupivicaine is utilized for postoperative analgesia.  A total of 50 mL is injected into the intercostal spaces to create intercostal block along the third, fourth, fifth, and sixth intercostal nerves.  The right pleural space was drained using a 28 French Bard drain placed through the posterior port incision. The miniature thoracotomy incision was closed in multiple layers in routine  fashion. The right groin incision was inspected for hemostasis and closed in multiple layers in routine fashion.  The post-bypass portion of the operation was notable for stable rhythm and hemodynamics.  No blood products were administered during the operation.   Disposition:  The patient tolerated the procedure well.  The patient was extubated in the operating room and subsequently transported to the surgical intensive care unit in stable condition. There were no intraoperative complications. All sponge instrument and needle counts are verified correct at completion of the operation.     Valentina Gu. Roxy Manns MD 08/03/2019 1:50 PM

## 2019-08-03 NOTE — Anesthesia Procedure Notes (Signed)
Procedure Name: Intubation Date/Time: 08/03/2019 8:03 AM Performed by: Clearnce Sorrel, CRNA Pre-anesthesia Checklist: Patient identified, Emergency Drugs available, Suction available, Patient being monitored and Timeout performed Patient Re-evaluated:Patient Re-evaluated prior to induction Oxygen Delivery Method: Circle system utilized Preoxygenation: Pre-oxygenation with 100% oxygen Induction Type: IV induction Ventilation: Mask ventilation without difficulty and Oral airway inserted - appropriate to patient size Laryngoscope Size: Mac and 3 Grade View: Grade II Tube type: Oral Endobronchial tube: Left and 35 Fr Number of attempts: 1 Airway Equipment and Method: Stylet Placement Confirmation: ETT inserted through vocal cords under direct vision,  positive ETCO2 and breath sounds checked- equal and bilateral Tube secured with: Tape Dental Injury: Teeth and Oropharynx as per pre-operative assessment

## 2019-08-03 NOTE — Brief Op Note (Addendum)
08/03/2019  12:07 PM  PATIENT:  Anne Hogan  74 y.o. female  PRE-OPERATIVE DIAGNOSIS:  1. Severe mitral Regurgitation 2. Moderate to severe tricuspid regurgitation  POST-OPERATIVE DIAGNOSIS:  1. Severe mitral Regurgitation 2. Mild to moderate tricuspid regurgitation 3. PATENT FORAMEN OVALE  PROCEDURE:  TRANSESOPHAGEAL ECHOCARDIOGRAM (TEE), MINIMALLY INVASIVE MITRAL VALVE REPAIR (MVR) (using Memo 4D, Model # 4DM, Serial # [id number] 86 MM Mitral Valve) with COMPLEX VALVULOPLASTY (6 pairs Neo Chords, 12 total), and CLOSURE OF PATENT FORAMEN OVALE    SURGEON:  Surgeon(s) and Role:    Rexene Alberts, MD - Primary  PHYSICIAN ASSISTANT: Lars Pinks PA-C  ANESTHESIA:   general  EBL:  550 mL  BLOOD ADMINISTERED:Per anesthesia and perfusion record  DRAINS: Chest tubes placed in the mediastinal and pleural spaces   COUNTS CORRECT:  YES  DICTATION: .Dragon Dictation  PLAN OF CARE: Admit to inpatient   PATIENT DISPOSITION:  ICU - intubated and hemodynamically stable.   Delay start of Pharmacological VTE agent (>24hrs) due to surgical blood loss or risk of bleeding: yes  BASELINE WEIGHT: 77.1 kg  Rexene Alberts, MD 08/03/2019 1:44 PM

## 2019-08-03 NOTE — Anesthesia Procedure Notes (Signed)
Central Venous Catheter Insertion Performed by: Roberts Gaudy, MD, anesthesiologist Start/End1/14/2021 6:55 AM, 08/03/2019 7:05 AM Patient location: Pre-op. Preanesthetic checklist: patient identified, IV checked, site marked, risks and benefits discussed, surgical consent, monitors and equipment checked, pre-op evaluation, timeout performed and anesthesia consent Hand hygiene performed  and maximum sterile barriers used  PA cath was placed.Swan type:thermodilution Procedure performed without using ultrasound guided technique. Attempts: 1 Following insertion, line sutured, dressing applied and Biopatch. Post procedure assessment: blood return through all ports, free fluid flow and no air  Patient tolerated the procedure well with no immediate complications.

## 2019-08-03 NOTE — Anesthesia Postprocedure Evaluation (Signed)
Anesthesia Post Note  Patient: Anne Hogan  Procedure(s) Performed: MINIMALLY INVASIVE MITRAL VALVE REPAIR (MVR) using Memo 4D 38 MM Mitral Valve. (Right Chest) TRANSESOPHAGEAL ECHOCARDIOGRAM (TEE) (N/A ) CLOSURE OF PATENT FORAMEN OVALE (N/A Chest)     Patient location during evaluation: SICU Anesthesia Type: General Level of consciousness: awake and alert, awake and patient cooperative Pain management: pain level controlled Vital Signs Assessment: post-procedure vital signs reviewed and stable Respiratory status: spontaneous breathing, nonlabored ventilation and respiratory function stable Cardiovascular status: stable Postop Assessment: no apparent nausea or vomiting Anesthetic complications: no    Last Vitals:  Vitals:   08/03/19 1900 08/03/19 1915  BP:    Pulse: 80 80  Resp: 12 15  Temp: (!) 35.6 C (!) 35.6 C  SpO2: 95% 94%    Last Pain:  Vitals:   08/03/19 1915  TempSrc:   PainSc: 0-No pain                 Cera Rorke COKER

## 2019-08-03 NOTE — Transfer of Care (Signed)
Immediate Anesthesia Transfer of Care Note  Patient: Anne Hogan  Procedure(s) Performed: MINIMALLY INVASIVE MITRAL VALVE REPAIR (MVR) using Memo 4D 38 MM Mitral Valve. (Right Chest) TRANSESOPHAGEAL ECHOCARDIOGRAM (TEE) (N/A ) CLOSURE OF PATENT FORAMEN OVALE (N/A Chest)  Patient Location: SICU  Anesthesia Type:General  Level of Consciousness: awake and oriented  Airway & Oxygen Therapy: Patient Spontanous Breathing and Patient connected to face mask oxygen  Post-op Assessment: Report given to RN and Post -op Vital signs reviewed and stable  Post vital signs: Reviewed and stable  Last Vitals:  Vitals Value Taken Time  BP    Temp    Pulse    Resp    SpO2      Last Pain:  Vitals:   08/03/19 0616  TempSrc: Oral  PainSc: 0-No pain         Complications: No apparent anesthesia complications

## 2019-08-03 NOTE — H&P (Signed)
[address]       [place] [address]             [phone]          CARDIOTHORACIC SURGERY HISTORY AND PHYSICAL EXAM  Primary Cardiologist is Will Meredith Leeds, MD PCP is [name], MD      Chief Complaint  Patient presents with  . Mitral Regurgitation    CATH/TEE 05/26/19    HPI:  Patient is 74 year old moderately obese female with history of mitral valve prolapse, mitral regurgitation, and palpitations who has been referred for surgical consultation to discuss treatment options for management of severe symptomatic primary mitral regurgitation.  Patient states that she has been told that she had mitral valve prolapse for many years.  Several other family members have had mitral valve prolapse and valvular heart disease.  She has been followed for the last several years by Dr. Curt Bears because of history of palpitations.  Previous echocardiograms have documented the presence of normal left ventricular systolic function with mitral valve prolapse and moderate to severe mitral regurgitation.  Long-term event monitor in 2019 revealed sinus rhythm with sinus tachycardia and rare PVCs as well as 8 brief runs of supraventricular tachycardia.  Over the past year the patient has noticed increasing symptoms of exertional shortness of breath, particularly since August of this year.  Transesophageal echocardiogram performed May 26, 2019 revealed normal left ventricular systolic function with mitral valve prolapse including an obvious flail segment of the middle scallop of the posterior leaflet and severe prolapse of the P3 segment of the posterior leaflet causing severe mitral regurgitation.  There was additionally moderate to severe tricuspid regurgitation.  Left and right heart catheterization was performed revealing normal coronary artery anatomy and no significant coronary artery disease.  There was mild pulmonary hypertension with elevated pulmonary  capillary wedge pressure consistent with severe mitral regurgitation.  Cardiothoracic surgical consultation was requested.  Patient is widowed and lives locally in [place] by herself.  She has been retired for several years having previously worked in [employer].  She enjoys playing the piano and painting.  She lives a fairly sedentary lifestyle.  She complains of exertional shortness of breath that she initially noticed last January but has become much more prominent over the past several months.  She was started on Singulair and Breo Ellipta and noticed some improvement in her breathing at rest but she still gets short of breath with exertion.  She denies resting shortness of breath, PND, orthopnea, or lower extremity edema.  She has never had any significant chest pain or chest tightness.  She reports occasional palpitations without dizzy spells or syncope.  Patient is a 74 year old female with history of mitral valve prolapse, mitral regurgitation, palpitations, and remote history of tobacco use who returns the office today for follow-up of mitral valve prolapse with stage D severe symptomatic primary mitral regurgitation. She was originally seen in consultation on May 29, 2019.  Shortly after that she underwent CT angiography as part of her routine preoperative work-up for surgery and was incidentally found to have a lung mass located in the left lower lobe.  Initial radiographic appearance revealed primarily groundglass opacity with small amounts of solid component potentially consistent with infection versus malignancy.  Follow-up imaging was recommended and plans for mitral valve repair were postponed.  Repeat CT scan performed July 20, 2019 revealed persistent large groundglass opacity with more pronounced solid components consistent with likely slow-growing primary  lung cancer.  Patient underwent FDG PET scan July 28, 2019 and returns her office today for follow-up.  PET  scan confirmed the presence of increased metabolic intake in the left lower lobe mass with SUV max of 3.47 suspicious for primary bronchogenic carcinoma, possibly adenocarcinoma.  There was no sign of increased uptake in the mediastinum or elsewhere in the body to suggest metastatic spread of disease.  Patient returns to our office today to discuss the results of the test further and make final plans for elective mitral valve repair.  She reports no new problems or complaints over the past week.  She specifically denies any productive cough, fevers, chills, hemoptysis, or recent exposure to persons with known or suspected COVID-19 infection.  She describes stable symptoms of exertional shortness of breath and fatigue consistent with chronic diastolic congestive heart failure, New York Heart Association functional class IIb.    Past Medical History:  Diagnosis Date  . ADD (attention deficit disorder)   . Arthritis    "right knee" (04/19/2013)  . Asthma 04/03/2019  . CHF (congestive heart failure) ([place])   . Claustrophobia   . Depression   . Dyspnea   . Dysrhythmia   . Fracture of right tibial plateau 05/2016  . GERD (gastroesophageal reflux disease)   . Heart murmur   . Incidental pulmonary nodule, greater than or equal to 36mm 06/06/2019   Needs f/u CT in 3 months  . Migraine    "bad when I was in 5th or 6th grade; I can usually ward them off by resting my head now" (04/19/2013)  . MVP (mitral valve prolapse)   . Sleep apnea    has not used mouth guard in 5 years no cpap used   . Squamous carcinoma ?1999   "upper left groin" (04/19/2013)    Past Surgical History:  Procedure Laterality Date  . CARDIAC CATHETERIZATION  05/26/2019  . CHOLECYSTECTOMY  04/18/2013  . CHOLECYSTECTOMY N/A 04/18/2013   Procedure: LAPAROSCOPIC CHOLECYSTECTOMY WITH INTRAOPERATIVE CHOLANGIOGRAM;  Surgeon: Ralene Ok, MD;  Location: [facility];  Service: General;  Laterality: N/A;  . COLONOSCOPY W/ POLYPECTOMY    .  EYE SURGERY Bilateral FALL  2016   IOC FOR CATARACTS  . JOINT REPLACEMENT    . KNEE ARTHROSCOPY Right 12/2007   "meniscus repair" (04/19/2013)  . RETINAL TEAR REPAIR CRYOTHERAPY Left ~ 1997  . RIGHT/LEFT HEART CATH AND CORONARY ANGIOGRAPHY N/A 05/26/2019   Procedure: RIGHT/LEFT HEART CATH AND CORONARY ANGIOGRAPHY;  Surgeon: Jettie Booze, MD;  Location: [facility];  Service: Cardiovascular;  Laterality: N/A;  . TEE WITHOUT CARDIOVERSION N/A 05/26/2019   Procedure: TRANSESOPHAGEAL ECHOCARDIOGRAM (TEE);  Surgeon: Josue Hector, MD;  Location: [facility];  Service: Cardiovascular;  Laterality: N/A;  . TONSILLECTOMY  ~ 1952  . TOTAL KNEE ARTHROPLASTY Right 09/21/2016   Procedure: RIGHT TOTAL KNEE ARTHROPLASTY;  Surgeon: Gaynelle Arabian, MD;  Location: [facility];  Service: Orthopedics;  Laterality: Right;  requests 40mins with abductor block  . VARICOSE VEIN SURGERY Right 2000's  . VASCULAR SURGERY    . WRIST SURGERY Right 1975   "had a knot taken off" (04/19/2013)    Family History  Problem Relation Age of Onset  . Tremor Mother   . Osteoporosis Mother   . Sarcoidosis Mother   . Hypertension Mother   . Congestive Heart Failure Father   . Heart attack Father   . Diabetes Mellitus II Father   . Diabetes Mellitus II Brother  Social History Social History   Tobacco Use  . Smoking status: Former Smoker    Packs/day: 1.50    Years: 20.00    Pack years: 30.00    Types: Cigarettes    Quit date: 11/30/1986    Years since quitting: 32.6  . Smokeless tobacco: Never Used  Substance Use Topics  . Alcohol use: Yes    Comment: RARE  . Drug use: No    Prior to Admission medications   Medication Sig Start Date End Date Taking? Authorizing Provider  aspirin 81 MG tablet Take 81 mg by mouth daily.   Yes [provider]  Biotin 5000 MCG TABS Take 5,000 mcg by mouth daily.   Yes [provider]  buPROPion (WELLBUTRIN XL) 300 MG 24 hr tablet Take 300 mg by mouth  daily.   Yes [provider]  Ca Phosphate-Cholecalciferol (CALCIUM/VITAMIN D3 GUMMIES PO) Take 2 each by mouth 2 (two) times daily. Alive Woman's over 35   Yes [provider]  Cyanocobalamin (VITAMIN B-12) 5000 MCG TBDP Take 5,000 mcg by mouth daily.   Yes [provider]  fluticasone furoate-vilanterol (BREO ELLIPTA) 100-25 MCG/INH AEPB USE 1 PUFF DAILY AS DIRECTED Patient taking differently: Inhale 1 puff into the lungs daily.  04/25/19  Yes Mannam, Praveen, MD  hydroxypropyl methylcellulose / hypromellose (ISOPTO TEARS / GONIOVISC) 2.5 % ophthalmic solution Place 1 drop into both eyes daily as needed for dry eyes.   Yes [provider]  montelukast (SINGULAIR) 10 MG tablet Take 10 mg by mouth at bedtime.  03/24/19  Yes [provider]  Multiple Vitamins-Minerals (MULTIVITAMIN WITH MINERALS) tablet Take 1 tablet by mouth daily. Master's complete   Yes [provider]  Vitamin D, Ergocalciferol, (DRISDOL) 1.25 MG (50000 UT) CAPS capsule Take 50,000 Units by mouth 2 (two) times a week. Nancy Fetter and Thurs   Yes [provider]  acetaminophen (TYLENOL) 500 MG tablet Take 500-1,000 mg by mouth every 6 (six) hours as needed for moderate pain (depends on pain if takes 1-2 tablets).    [provider]  albuterol (PROVENTIL) (2.5 MG/3ML) 0.083% nebulizer solution Take 2.5 mg by nebulization every 4 (four) hours as needed for shortness of breath.  03/29/19   [provider]  albuterol (VENTOLIN HFA) 108 (90 Base) MCG/ACT inhaler Inhale 1-2 puffs into the lungs every 4 (four) hours as needed for wheezing or shortness of breath. PRO AIR    [provider]    Allergies  Allergen Reactions  . Penicillins Rash    Has patient had a PCN reaction causing immediate rash, facial/tongue/throat swelling, SOB or lightheadedness with hypotension: unknown Has patient had a PCN reaction causing severe rash involving mucus membranes or skin  necrosis: no Has patient had a PCN reaction that required hospitalization: unknown Has patient had a PCN reaction occurring within the last 10 years: no If all of the above answers are "NO", then may proceed with Cephalosporin use.   . Codeine Other (See Comments)    Possible sensitivity, patient not sure  . Milk-Related Compounds Nausea And Vomiting and Other (See Comments)    Can have ice cream, pt just doesn't want to drink milk  . Morphine And Related     "strange sensation across midsection and chest"    Review of Systems:              General:  normal appetite, normal energy, no weight gain, no weight loss, no fever             Cardiac:                       no chest pain with exertion, no chest pain at rest, +SOB with exertion, no resting SOB, no PND, no orthopnea, + palpitations, no arrhythmia, no atrial fibrillation, no LE edema, no dizzy spells, no syncope             Respiratory:                 + exertional shortness of breath, no home oxygen, no productive cough, no dry cough, no bronchitis, no wheezing, no hemoptysis, no asthma, no pain with inspiration or cough, + sleep apnea, no CPAP at night             GI:                               no difficulty swallowing, + reflux, no frequent heartburn, no hiatal hernia, no abdominal pain, no constipation, no diarrhea, no hematochezia, no hematemesis, no melena             GU:                              no dysuria,  no frequency, no urinary tract infection, no hematuria, no enlarged prostate, no kidney stones, + stage III kidney disease             Vascular:                     no pain suggestive of claudication, no pain in feet, no leg cramps, no varicose veins, no DVT, no non-healing foot ulcer             Neuro:                         no stroke, no TIA's, no seizures, no headaches, no temporary blindness one eye,  no slurred speech, no peripheral neuropathy, no chronic pain, no instability of gait, no  memory/cognitive dysfunction             Musculoskeletal:         + arthritis, no joint swelling, no myalgias, no difficulty walking, normal mobility              Skin:                            no rash, no itching, no skin infections, no pressure sores or ulcerations             Psych:                         no anxiety, + depression, no nervousness, no unusual recent stress             Eyes:                           no blurry vision, + floaters, no recent vision changes, + wears glasses or contacts             ENT:                            +  hearing loss, no loose or painful teeth, no dentures, last saw dentist December 2019             Hematologic:               no easy bruising, no abnormal bleeding, no clotting disorder, no frequent epistaxis             Endocrine:                   no diabetes, does not check CBG's at home                           Physical Exam:              BP 110/73 (BP Location: Left Arm, Patient Position: Sitting, Cuff Size: Normal)   Pulse 86   Temp (!) 96.4 F (35.8 C)   Resp 16   Ht 5' 2.75" (1.594 m)   Wt 170 lb (77.1 kg)   SpO2 97% Comment: RA  BMI 30.35 kg/m              General:                      Moderately obese,  well-appearing             HEENT:                       Unremarkable              Neck:                           no JVD, no bruits, no adenopathy              Chest:                          clear to auscultation, symmetrical breath sounds, no wheezes, no rhonchi              CV:                              RRR, grade IV/VI holosystolic murmur              Abdomen:                    soft, non-tender, no masses              Extremities:                 warm, well-perfused, pulses diminished, no LE edema             Rectal/GU                   Deferred             Neuro:                         Grossly non-focal and symmetrical throughout             Skin:                            Clean and dry, no rashes, no  breakdown  Diagnostic Tests:  ECHOCARDIOGRAM REPORT     Patient Name: Anne Hogan Date of Exam: 08/29/2018 Medical Rec #: [id number] Height: 62.0 in Accession #: [id number] Weight: 162.0 lb Date of Birth: Jun 08, 1946 BSA: 1.75 m Patient Age: 3 years BP: 126/70 mmHg Patient Gender: F HR: 88 bpm. Exam Location: [facility]   Procedure: 2D Echo, 3D Echo, Cardiac Doppler and Color Doppler  Indications: I34.1 MVP  History: Patient has prior history of Echocardiogram examinations, most recent 09/11/2016. Mitral Valve Prolapse; Signs/Symptoms: Murmur, Syncope and Dyspnea; Risk Factors: Family History of Coronary Artery Disease and Former Smoker. Palpitaitons, Dizziness, Migraines, Obesity, Obstructive Sleep Apnea.  Sonographer: Deliah Boston RDCS Referring Phys: [id number] WILL MARTIN [place]   1. The left ventricle has hyperdynamic systolic function of >76%. The cavity size was normal. There is no increased left ventricular wall thickness. Echo evidence of pseudonormalization in diastolic relaxation Elevated mean left atrial pressure. 2. Left atrial size was severely dilated. 3. Severe mitral valve prolapse, posterior>anterior leaflet. 4. The mitral valve is myxomatous. There is moderate thickening of the anterior and posterior leaflets. 5. Mitral valve regurgitation is severe. The MR jet is eccentric anteriorly and medially directed. The estimated effective regurgitant orifice area is 0.7 cm sq. by the PISA method. The regurgitant volume is 100 mL, regurgitant fraction is 60%. There is flow reversal in the pulmonary veins. 6. Right ventricular systolic pressure is mildly elevated with an estimated pressure of 37 mmHg. 7. Aortic valve regurgitation is mild.  FINDINGS Left Ventricle:  The left ventricle has hyperdynamic systolic function of >28%. The cavity size was normal. There is no increased left ventricular wall thickness. Echo evidence of pseudonormalization in diastolic relaxation Elevated mean left atrial  pressure No evidence of left ventricular regional wall motion abnormalities.. Right Ventricle: The right ventricle has normal systolic function. The cavity was normal. There is no increase in right ventricular wall thickness. Right ventricular systolic pressure is mildly elevated with an estimated pressure of 37.1 mmHg. Left Atrium: left atrial size was severely dilated Right Atrium: right atrial size was normal in size Interatrial Septum: No atrial level shunt detected by color flow Doppler.  Pericardium: There is no evidence of pericardial effusion. Mitral Valve: The mitral valve is myxomatous. There is moderate thickening of the anterior and posterior leaflets. Mitral valve regurgitation is severe by color flow Doppler. The MR jet is eccentric medially directed. There is severe late systolic  prolapse of of both mitral leaflets of the mitral valve. Tricuspid Valve: The tricuspid valve is not well seen. The tricuspid valve is normal in structure. Tricuspid valve regurgitation is mild by color flow Doppler. Aortic Valve: The aortic valve is normal in structure. Aortic valve regurgitation is mild by color flow Doppler. Pulmonic Valve: The pulmonic valve was normal in structure. Pulmonic valve regurgitation is not visualized by color flow Doppler.  LEFT VENTRICLE PLAX 2D (Teich) LV EF: 61.7 % Diastology LVIDd: 4.82 cm LV e' lateral: 18.30 cm/s LVIDs: 3.22 cm LV E/e' lateral: 8.0 LV PW: 1.07 cm LV e' medial: 9.36 cm/s LV IVS: 0.78 cm LV E/e' medial: 15.7 LVOT diam: 1.90 cm LV SV: 67 ml LVOT Area: 2.84 cm  RIGHT VENTRICLE RV S prime: 19.60 cm/s TAPSE (M-mode): 3.1 cm RVSP:  37.1 mmHg  LEFT ATRIUM Index RIGHT ATRIUM Index LA diam: 4.75 cm 2.72 cm/m RA Pressure: 3 mmHg LA Vol (A2C): 177.0 ml 101.26 ml/m RA Area: 17.70 cm LA Vol (A4C): 93.7 ml 53.61 ml/m RA Volume: 46.70 ml  26.72 ml/m LA Biplane Vol: 144.0 ml 82.38 ml/m AORTIC VALVE LVOT Vmax: 115.00 cm/s LVOT Vmean: 58.200 cm/s LVOT VTI: 0.188 m AR PHT: 481 msec  AORTA Ao Root diam: 3.00 cm Ao Asc diam: 2.80 cm  MITRAL VALVE TR Peak grad: 34.1 mmHg MV Area (PHT): cm TR Vmax: 352.00 cm/s MV Peak grad: 9.0 mmHg RVSP: 37.1 mmHg MV Mean grad: 2.0 mmHg MV Vmax: 1.50 m/s MV Vmean: 65.6 cm/s MV VTI: 0.30 m MV PHT: msec MV Decel Time: 187 msec MR Peak grad: 104.0 mmHg MR Mean grad: 57.0 mmHg MR Vmax: 510.00 cm/s MR Vmean: 338.0 cm/s MR PISA: 7.55 MV E velocity: 147.00 cm/s MV A velocity: 73.30 cm/s MV E/A ratio: 2.01   Mihai Croitoru MD Electronically signed by Sanda Klein MD Signature Date/Time: 08/29/2018/5:07:38 PM    TRANSESOPHOGEAL ECHO REPORT     Patient Name: Anne Hogan Date of Exam: 05/26/2019 Medical Rec #: [id number] Height: 62.5 in Accession #: [id number] Weight: 171.0 lb Date of Birth: January 19, 1946 BSA: 1.80 m Patient Age: 22 years BP: 131/64 mmHg Patient Gender: F HR: 76 bpm. Exam Location: Inpatient   Procedure: Transesophageal Echo  Indications: Mitral regurgitation  History: Patient has prior history of Echocardiogram examinations, most recent 08/29/2018. Mitral Valve Disease Risk Factors:Sleep Apnea and Former Smoker.  Sonographer: Clayton Lefort RDCS (AE) Referring Phys: [id number] [place]: The transesophogeal probe was passed through  the esophogus of the patient. The patient developed no complications during the procedure.  IMPRESSIONS   1. Left ventricular ejection fraction, by visual estimation, is 60 to 65%. The left ventricle has normal function. Normal left ventricular size. Left ventricular septal wall thickness was normal. Normal left ventricular posterior wall thickness. There  is no left ventricular hypertrophy. 2. Global right ventricle has normal systolic function.The right ventricular size is mildly enlarged. No increase in right ventricular wall thickness. 3. Left atrial size was severely dilated. 4. Right atrial size was mildly dilated. 5. The mitral valve is myxomatous. Severe mitral valve regurgitation. 6. Myxomatous valve with flail P2 segment and prolapse of P3 as well Severe eccentric anteriorly directed MR. 7. The tricuspid valve is abnormal. Tricuspid valve regurgitation moderate-severe. 8. The aortic valve is tricuspid. Aortic valve regurgitation is mild. 9. The pulmonic valve was normal in structure. Pulmonic valve regurgitation is mild. 10. Moderately elevated pulmonary artery systolic pressure. 11. Evidence of atrial level shunting detected by color flow Doppler.  FINDINGS Left Ventricle: Left ventricular ejection fraction, by visual estimation, is 60 to 65%. The left ventricle has normal function. Left ventricular septal wall thickness was normal. Normal left ventricular posterior wall thickness. There is no left  ventricular hypertrophy. Normal left ventricular size.  Right Ventricle: The right ventricular size is mildly enlarged. No increase in right ventricular wall thickness. Global RV systolic function is has normal systolic function. The tricuspid regurgitant velocity is 3.79 m/s, and with an assumed right atrial pressure of 10 mmHg, the estimated right ventricular systolic pressure is moderately elevated at 67.5 mmHg.  Left Atrium: Left atrial size was severely  dilated.  Right Atrium: Right atrial size was mildly dilated  Pericardium: There is no evidence of pericardial effusion.  Mitral Valve: The mitral valve is myxomatous. Severe mitral valve regurgitation. Myxomatous valve with flail P2 segment and prolapse of P3 as well Severe eccentric anteriorly directed MR.  Tricuspid Valve: The tricuspid valve is abnormal. Tricuspid valve regurgitation moderate-severe.  Aortic Valve: The aortic valve is tricuspid. Aortic valve regurgitation  is mild.  Pulmonic Valve: The pulmonic valve was normal in structure. Pulmonic valve regurgitation is mild.  Aorta: The aortic root is normal in size and structure.  Shunts: Evidence of atrial level shunting detected by color flow Doppler.   MR Peak grad: 68.9 mmHg TRICUSPID VALVE Normals MR Mean grad: 41.0 mmHg TR Peak grad: 57.5 mmHg MR Vmax: 415.00 cm/s TR Vmax: 379.00 cm/s 288 cm/s MR Vmean: 300.0 cm/s MR PISA: 20.36 cm MR PISA Eff ROA: 142 mm MR PISA Radius: 1.80 cm   Jenkins Rouge MD Electronically signed by Jenkins Rouge MD Signature Date/Time: 05/26/2019/11:01:47 AM   RIGHT/LEFT HEART CATH AND CORONARY ANGIOGRAPHY  Conclusion    No angiographically apparent coronary artery disease.  The left ventricular ejection fraction is greater than 65% by visual estimate.  The left ventricular systolic function is normal.  LV end diastolic pressure is normal.  There is severe (4+) mitral regurgitation.  There is no aortic valve stenosis.  Hemodynamic findings consistent with mild pulmonary hypertension.  Ao sat 99%, PA sat 75%, mean PA pressure 26 mm Hg; mean PCWP 13 mm Hg;  Aortogram showed no AAA and no renal artery stenosis. Widely patent iliac arteries bilaterally.  Proceed with MV repair w/u. Discharge from short stay later today.   Recommendations  Discharge Date In the absence of any other complications or  medical issues, we expect the patient to be ready for discharge from a cath perspective on 05/26/2019.  Surgeon Notes    05/26/2019 10:57 AM CV Procedure signed by Josue Hector, MD  Indications  Severe mitral regurgitation [I34.0 (ICD-10-CM)]  Procedural Details  Technical Details The risks, benefits, and details of the procedure were explained to the patient. The patient verbalized understanding and wanted to proceed. Informed written consent was obtained.  PROCEDURE TECHNIQUE: After Xylocaine anesthesia, a 5 French sheath was placed in the right antecubital area in exchange for a the peripheral IV. A 5 French balloontipped Swan-Ganz catheter was advanced to the pulmonary artery under fluoroscopic guidance. Hemodynamic pressures were obtained. Oxygen saturations were obtained. After Xylocaine anesthesia, a 30F sheath was placed in the right radial artery with a single anterior needle wall stick. Left coronary angiography was done using a Judkins L3.5 guide catheter. Right coronary angiography and Left heart cath were done using a Judkins R4 guide catheter. Abdominal aortogram was done using a pigtail catheter and power injection of contrast. Aortogram showed no AAA and no renal artery stenosis. Widely patent iliac arteries.     Contrast: 80 cc  Estimated blood loss <50 mL.   During this procedure medications were administered to achieve and maintain moderate conscious sedation while the patient's heart rate, blood pressure, and oxygen saturation were continuously monitored and I was present face-to-face 100% of this time.   Sedation Time  Sedation Time Physician-1: 30 minutes 48 seconds  Contrast  Medication Name Total Dose  iohexol (OMNIPAQUE) 350 MG/ML injection 80 mL    Radiation/Fluoro  Fluoro time: 3.4 (min) DAP: 11927 (mGycm2) Cumulative Air Kerma: 190 (mGy)  Coronary Findings  Diagnostic Dominance: Right Left Main  Vessel was injected. Vessel is normal  in caliber. Vessel is angiographically normal.  Left Anterior Descending  Vessel was injected. Vessel is normal in caliber. Vessel is angiographically normal.  Left Circumflex  Vessel was injected. Vessel is normal in caliber. Vessel is angiographically normal.  Right Coronary Artery  Vessel was injected. Vessel is normal in caliber. Vessel is angiographically normal.  Intervention  No interventions have been  documented. Right Heart  Right Heart Pressures Hemodynamic findings consistent with mild pulmonary hypertension. Ao sat 99%, PA sat 75%, mean PA pressure 26 mm Hg; mean PCWP 13 mm Hg;  Wall Motion       Resting               Left Heart  Left Ventricle The left ventricular size is normal. The left ventricular systolic function is normal. LV end diastolic pressure is normal. The left ventricular ejection fraction is greater than 65% by visual estimate. No regional wall motion abnormalities. There is severe (4+) mitral regurgitation.  Aortic Valve There is no aortic valve stenosis.  Coronary Diagrams  Diagnostic Dominance: Right  Intervention  Implants      No implant documentation for this case.  Syngo Images  Show images for CARDIAC CATHETERIZATION  Images on Long Term Storage  Show images for Jonette, [place] to Procedure Log  Procedure Log    Hemo Data   Most Recent Value  Fick Cardiac Output 5.47 L/min  Fick Cardiac Output Index 3.02 (L/min)/BSA  RA A Wave 8 mmHg  RA V Wave 4 mmHg  RA Mean 3 mmHg  RV Systolic Pressure 35 mmHg  RV Diastolic Pressure -1 mmHg  RV EDP 2 mmHg  PA Systolic Pressure 40 mmHg  PA Diastolic Pressure 12 mmHg  PA Mean 23 mmHg  PW A Wave 14 mmHg  PW V Wave 25 mmHg  PW Mean 13 mmHg  AO Systolic Pressure 528 mmHg  AO Diastolic Pressure 53 mmHg  AO Mean 73 mmHg  LV Systolic Pressure 94 mmHg  LV Diastolic Pressure 2 mmHg  LV EDP 8 mmHg  AOp Systolic Pressure 96 mmHg  AOp Diastolic Pressure 57 mmHg   AOp Mean Pressure 74 mmHg  LVp Systolic Pressure 96 mmHg  LVp Diastolic Pressure 5 mmHg  LVp EDP Pressure 10 mmHg  QP/QS 1  TPVR Index 7.63 HRUI  TSVR Index 24.2 HRUI  PVR SVR Ratio 0.14  TPVR/TSVR Ratio 0.32       CT ANGIOGRAPHY CHEST, ABDOMEN AND PELVIS  TECHNIQUE: Multidetector CT imaging through the chest, abdomen and pelvis was performed using the standard protocol during bolus administration of intravenous contrast. Multiplanar reconstructed images and MIPs were obtained and reviewed to evaluate the vascular anatomy.  CONTRAST: 68mL ISOVUE-370 IOPAMIDOL (ISOVUE-370) INJECTION 76%  COMPARISON: 04/05/2013  FINDINGS: CTA CHEST FINDINGS  Cardiovascular: Mild cardiomegaly. No significant pericardial effusion. Satisfactory opacification of pulmonary arteries noted, and there is no evidence of pulmonary emboli. Left atrial enlargement. Adequate contrast opacification of the thoracic aorta with no evidence of dissection, aneurysm, or stenosis. There is classic 3-vessel brachiocephalic arch anatomy without proximal stenosis. Minimal atheromatous change.  Mediastinum/Nodes: No hilar or mediastinal adenopathy.  Lungs/Pleura: No pleural effusion. Pulmonary emphysema. No pneumothorax. 3.9 cm sub solid mid left lower lobe pulmonary opacity. 4 mm probable intrapulmonary lymph node adjacent to the left major fissure (Im98,Se5) , stable since 04/04/2013. 3 mm nodule adjacent to the left major fissure (Im62,Se5) , non-specific.  Musculoskeletal: No chest wall abnormality. No acute or significant osseous findings.  Review of the MIP images confirms the above findings.  CTA ABDOMEN AND PELVIS FINDINGS  VASCULAR  Aorta: Mild calcified plaque. No aneurysm, dissection, or stenosis. No significant tortuosity.  Celiac: Patent without evidence of aneurysm, dissection, vasculitis or significant stenosis.  SMA: Patent without evidence of aneurysm, dissection,  vasculitis or significant stenosis.  Renals: Both renal arteries are patent without evidence of aneurysm,  dissection, vasculitis, fibromuscular dysplasia or significant stenosis.  IMA: Patent without evidence of aneurysm, dissection, vasculitis or significant stenosis.  Inflow: Minimal calcified plaque in the common iliac arteries. No aneurysm, dissection, or stenosis. Visualized proximal lower extremity outflow patent.  Veins: Patent bilateral renal veins and portal vein. Unremarkable ilio-caval venous anatomy. No obvious venous pathology within limitations of this arterial phase study.  Review of the MIP images confirms the above findings.  NON-VASCULAR  Hepatobiliary: Stable anterior right hepatic cysts. No new lesion. No biliary ductal dilatation. Gallbladder not visualized.  Pancreas: Unremarkable. No pancreatic ductal dilatation or surrounding inflammatory changes.  Spleen: Normal in size without focal abnormality.  Adrenals/Urinary Tract: Normal adrenals. Interval enlargement of 4.6 cm mid right renal cyst. No hydronephrosis. Urinary bladder incompletely distended.  Stomach/Bowel: Stomach and small bowel are nondistended. Normal appendix. A few scattered distal descending and proximal sigmoid diverticula without significant adjacent inflammatory/edematous change or abscess.  Lymphatic: No abdominal or pelvic adenopathy.  Reproductive: Uterus and bilateral adnexa are unremarkable.  Other: No ascites. No free air.  Musculoskeletal: Mild degenerative disc disease L4-5. Facet DJD L4-S1. No fracture or worrisome bone lesion.  Review of the MIP images confirms the above findings.  IMPRESSION: 1. Negative for acute PE or thoracic aortic dissection. 2. 3.9 cm sub-solid left lower lobe pulmonary opacity, may represent focal infectious/inflammatory process versus adenocarcinoma. Follow-up recommended. 3. Scattered colonic diverticula.  Aortic  Atherosclerosis (ICD10-I70.0) and Emphysema (ICD10-J43.9).   Electronically Signed By: Lucrezia Europe M.D. On: 06/06/2019 16:46    CT CHEST WITH CONTRAST  TECHNIQUE: Multidetector CT imaging of the chest was performed during intravenous contrast administration.  CONTRAST: 68mL ISOVUE-300 IOPAMIDOL (ISOVUE-300) INJECTION 61%  COMPARISON: CT chest pulmonary angiogram, 06/06/2019, CT abdomen pelvis, 04/04/2013  FINDINGS: Cardiovascular: Scattered aortic atherosclerosis. Enlargement of the left atrium. No pericardial effusion.  Mediastinum/Nodes: No enlarged mediastinal, hilar, or axillary lymph nodes. Thyroid gland, trachea, and esophagus demonstrate no significant findings.  Lungs/Pleura: Mild centrilobular emphysema. There is a redemonstrated mixed solid and ground-glass nodule of the left lower lobe. This is not significantly changed in overall size compared to prior examination, measuring 3.9 x 2.5 x 2.1 cm (series 8, image 88) however does demonstrate some increasing solid character, particularly in the inferior aspect (series 8, image 90, series 4, image 104). Additional stable, benign 4 mm nodule of the adjacent left lower lobe, seen on prior examination dated 04/04/2013 (series 8, image 97). There are additional scattered, nonspecific small ground-glass opacities most notably of the bilateral lung apices (series 8, image 25, 19). No pleural effusion or pneumothorax.  Upper Abdomen: No acute abnormality.  Musculoskeletal: No chest wall mass or suspicious bone lesions identified.  IMPRESSION: 1. There is a redemonstrated mixed solid and ground-glass nodule of the left lower lobe. This is not significantly changed in overall size compared to prior examination, measuring 3.9 x 2.5 x 2.1 cm (series 8, image 88) however does demonstrate some increasing solid character, particularly in the inferior aspect (series 8, image 90, series 4, image 104).  Persistence and increasing solidity are highly concerning for adenocarcinoma. Recommend tissue sampling and consideration of PET-CT for metabolic characterization (size of solid component should be sufficient to evaluate). 2. There are additional scattered, nonspecific small ground-glass opacities most notably of the bilateral lung apices, given appearance and distribution likely infectious or inflammatory (series 8, image 25, 19). Attention on follow-up. 3. Mild centrilobular emphysema. Emphysema (ICD10-J43.9). 4. Aortic Atherosclerosis (ICD10-I70.0).  These results will be called to the ordering clinician or representative  by the Radiologist Assistant, and communication documented in the PACS or zVision Dashboard.   Electronically Signed By: Eddie Candle M.D. On: 07/20/2019 17:04     NUCLEAR MEDICINE PET SKULL BASE TO THIGH  TECHNIQUE: 8.5 mCi F-18 FDG was injected intravenously. Full-ring PET imaging was performed from the skull base to thigh after the radiotracer. CT data was obtained and used for attenuation correction and anatomic localization.  Fasting blood glucose: 103 mg/dl  COMPARISON: 07/20/2019  FINDINGS: Mediastinal blood pool activity: SUV max 2.8  Liver activity: SUV max NA  NECK: No hypermetabolic lymph nodes in the neck.  Incidental CT findings: none  CHEST: The persistent left lower lobe lung nodule measures 2.5 x 2.0 cm and has an SUV max of 3.47. This is not significantly changed in size from 06/06/2019 but appears progressively more solid.  Also in the left lower lobe is a 3 mm, too small to characterize lung nodule, image 43/8.  Incidental CT findings: Centrilobular emphysema. Mild aortic atherosclerosis.  ABDOMEN/PELVIS: No abnormal hypermetabolic activity within the liver, pancreas, adrenal glands, or spleen. No hypermetabolic lymph nodes in the abdomen or pelvis.  Incidental CT findings: Chronic cysts within  right hepatic lobe, similar to previous exam from 04/04/2013. Aortic atherosclerosis identified. 4.8 cm cyst arises from upper pole of the right kidney. Small indeterminate lesion arising from posterior cortex of left kidney measures 7 mm. This is too small to characterize.  SKELETON: No focal hypermetabolic activity to suggest skeletal metastasis.  Incidental CT findings: none  IMPRESSION: Increased FDG uptake associated with left lower lobe lung nodule has an SUV max of 3.47. Findings are suspicious for primary bronchogenic carcinoma, possibly adenocarcinoma. Correlation with tissue sampling advised.  No FDG avid thoracic lymph nodes or evidence of distant metastatic disease.  Aortic Atherosclerosis (ICD10-I70.0) and Emphysema (ICD10-J43.9).   Electronically Signed By: Kerby Moors M.D. On: 07/28/2019 10:04   Impression:  Patient has mitral valve prolapse with stage D severe symptomatic primary mitral regurgitation. She describes gradual progression of symptoms of exertional shortness of breath consistent with chronic diastolic congestive heart failure, [place] Heart Association functional class IIb. I have personally reviewed the patient's recent transesophageal echocardiogram and diagnostic cardiac catheterization. 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 They are ruptured primary chordae tendinaeto the P2 segment causing a flail leaflet with severe mitral regurgitation. Left ventricular systolic function remains normal. There is severe left atrial enlargement. There is significant tricuspid regurgitation. I agree the patient would benefit from elective mitral valve repair. Risks associated with surgery should be relatively low based upon appearance of TEEfeel there is greater than 95% likelihood of successful valve repair.  Subsequent CT  and PET imaging demonstrate a large groundglass opacity with some soft tissue component in the left lung that is hypermetabolic on PET imaging and highly suspicious for primary lung cancer, suspect adenocarcinoma.  There is no sign of metastatic lymphadenopathy or distant metastatic disease.  Definitive treatment will likely require left lower lobectomy because of the size and location of the mass.  The patient's case has been reviewed at length with Dr. Roxan Hockey and other partners, all of whom concur that the patient would likely best be treated by initially undergoing mitral valve repair followed by delayed surgical treatment of her presumed lung cancer.   Plan:  I have again reviewed the results of the patient's recent PET CT scan with the patient and her close friend,  Ms. Darrol Jump [facility].  The concerns that the lung mass likely represents primary lung cancer was explained.  The rationale for proceeding with elective mitral valve repair first followed by delayed definitive treatment of the patient's lung mass was explained at length.  The patient was also counseled again regarding the indications, risks and potential benefits of mitral valve repair.    The likelihood of successful and durable mitral valve repair has been discussed with particular reference to the findings of their recent echocardiogram.  Based upon these findings and previous experience, I have quoted them a greater than 95 percent likelihood of successful valve repair with less than 1 percent risk of mortality or major morbidity.  Alternative surgical approaches have been discussed including a comparison between conventional sternotomy and minimally-invasive techniques.  The relative risks and benefits of each have been reviewed as they pertain to the patient's specific circumstances, and expectations for the patient's postoperative convalescence has been discussed.   In the unlikely event that the patient's valve cannot be  successfully repaired, we discussed the possibility of replacing the mitral valve using a mechanical prosthesis with the attendant need for long-term anticoagulation versus the alternative of replacing it using a bioprosthetic tissue valve with its potential for late structural valve deterioration and failure, depending upon the patient's longevity.  The patient specifically requests that if the mitral valve must be replaced that it be done using a bioprosthetic tissue valve.     The patient understands and accepts all potential risks of surgery including but not limited to risk of death, stroke or other neurologic complication, myocardial infarction, congestive heart failure, respiratory failure, renal failure, bleeding requiring transfusion and/or reexploration, arrhythmia, infection or other wound complications, pneumonia, pleural and/or pericardial effusion, pulmonary embolus, aortic dissection or other major vascular complication, or delayed complications related to valve repair or replacement including but not limited to structural valve deterioration and failure, thrombosis, embolization, endocarditis, or paravalvular leak.  Specific risks potentially related to the minimally-invasive approach were discussed at length, including but not limited to risk of conversion to full or partial sternotomy, aortic dissection or other major vascular complication, unilateral acute lung injury or pulmonary edema, phrenic nerve dysfunction or paralysis, rib fracture, chronic pain, lung hernia, or lymphocele.  Patient also understands the possibility that her presumed lung cancer might progress depending upon how soon she will be ready for another major surgical procedure.  All of their questions have been answered.     Valentina Gu. Roxy Manns, MD 07/31/2019 1:48 PM

## 2019-08-03 NOTE — Interval H&P Note (Signed)
History and Physical Interval Note:  08/03/2019 6:36 AM  Anne Hogan  has presented today for surgery, with the diagnosis of Mitral Regurgitation.  The various methods of treatment have been discussed with the patient and family. After consideration of risks, benefits and other options for treatment, the patient has consented to  Procedure(s): MINIMALLY INVASIVE MITRAL VALVE REPAIR (MVR) (Right) Possible, MINIMALLY INVASIVE TRICUSPID VALVE REPAIR (Right) TRANSESOPHAGEAL ECHOCARDIOGRAM (TEE) (N/A) as a surgical intervention.  The patient's history has been reviewed, patient examined, no change in status, stable for surgery.  I have reviewed the patient's chart and labs.  Questions were answered to the patient's satisfaction.     Rexene Alberts

## 2019-08-04 ENCOUNTER — Other Ambulatory Visit: Payer: Self-pay | Admitting: Physician Assistant

## 2019-08-04 ENCOUNTER — Inpatient Hospital Stay (HOSPITAL_COMMUNITY): Payer: PPO

## 2019-08-04 DIAGNOSIS — Z9889 Other specified postprocedural states: Secondary | ICD-10-CM

## 2019-08-04 LAB — BASIC METABOLIC PANEL
Anion gap: 7 (ref 5–15)
Anion gap: 7 (ref 5–15)
BUN: 11 mg/dL (ref 8–23)
BUN: 11 mg/dL (ref 8–23)
CO2: 22 mmol/L (ref 22–32)
CO2: 24 mmol/L (ref 22–32)
Calcium: 7.2 mg/dL — ABNORMAL LOW (ref 8.9–10.3)
Calcium: 7.2 mg/dL — ABNORMAL LOW (ref 8.9–10.3)
Chloride: 108 mmol/L (ref 98–111)
Chloride: 104 mmol/L (ref 98–111)
Creatinine, Ser: 0.72 mg/dL (ref 0.44–1.00)
Creatinine, Ser: 0.96 mg/dL (ref 0.44–1.00)
GFR calc Af Amer: >60 mL/min (ref >60)
GFR calc Af Amer: >60 mL/min (ref >60)
GFR calc non Af Amer: >60 mL/min (ref >60)
GFR calc non Af Amer: 59 mL/min — ABNORMAL LOW (ref >60)
Glucose, Bld: 132 mg/dL — ABNORMAL HIGH (ref 70–99)
Glucose, Bld: 96 mg/dL (ref 70–99)
Potassium: 3.8 mmol/L (ref 3.5–5.1)
Potassium: 3.4 mmol/L — ABNORMAL LOW (ref 3.5–5.1)
Sodium: 137 mmol/L (ref 135–145)
Sodium: 135 mmol/L (ref 135–145)

## 2019-08-04 LAB — CBC
HCT: 31.5 % — ABNORMAL LOW (ref 36.0–46.0)
HCT: 30.3 % — ABNORMAL LOW (ref 36.0–46.0)
Hemoglobin: 10.3 g/dL — ABNORMAL LOW (ref 12.0–15.0)
Hemoglobin: 9.9 g/dL — ABNORMAL LOW (ref 12.0–15.0)
MCH: 29.9 pg (ref 26.0–34.0)
MCH: 29.9 pg (ref 26.0–34.0)
MCHC: 32.7 g/dL (ref 30.0–36.0)
MCHC: 32.7 g/dL (ref 30.0–36.0)
MCV: 91.3 fL (ref 80.0–100.0)
MCV: 91.5 fL (ref 80.0–100.0)
Platelets: 150 10*3/uL (ref 150–400)
Platelets: 142 10*3/uL — ABNORMAL LOW (ref 150–400)
RBC: 3.45 MIL/uL — ABNORMAL LOW (ref 3.87–5.11)
RBC: 3.31 MIL/uL — ABNORMAL LOW (ref 3.87–5.11)
RDW: 12.3 % (ref 11.5–15.5)
RDW: 12.4 % (ref 11.5–15.5)
WBC: 9.3 10*3/uL (ref 4.0–10.5)
WBC: 10.4 10*3/uL (ref 4.0–10.5)
nRBC: 0 % (ref 0.0–0.2)
nRBC: 0 % (ref 0.0–0.2)

## 2019-08-04 LAB — ECHO INTRAOPERATIVE TEE
Height: 62.5 in
Weight: 2719.98 oz

## 2019-08-04 LAB — GLUCOSE, CAPILLARY
Glucose-Capillary: 102 mg/dL — ABNORMAL HIGH (ref 70–99)
Glucose-Capillary: 103 mg/dL — ABNORMAL HIGH (ref 70–99)
Glucose-Capillary: 105 mg/dL — ABNORMAL HIGH (ref 70–99)
Glucose-Capillary: 110 mg/dL — ABNORMAL HIGH (ref 70–99)
Glucose-Capillary: 121 mg/dL — ABNORMAL HIGH (ref 70–99)
Glucose-Capillary: 127 mg/dL — ABNORMAL HIGH (ref 70–99)
Glucose-Capillary: 131 mg/dL — ABNORMAL HIGH (ref 70–99)
Glucose-Capillary: 133 mg/dL — ABNORMAL HIGH (ref 70–99)
Glucose-Capillary: 83 mg/dL (ref 70–99)
Glucose-Capillary: 87 mg/dL (ref 70–99)
Glucose-Capillary: 91 mg/dL (ref 70–99)

## 2019-08-04 LAB — MAGNESIUM
Magnesium: 2.2 mg/dL (ref 1.7–2.4)
Magnesium: 1.9 mg/dL (ref 1.7–2.4)

## 2019-08-04 MED ORDER — FUROSEMIDE 10 MG/ML IJ SOLN
20.0000 mg | Freq: Four times a day (QID) | INTRAMUSCULAR | Status: AC
  Administered 2019-08-04 (×3): 20 mg via INTRAVENOUS
  Filled 2019-08-04 (×3): qty 2

## 2019-08-04 MED ORDER — ASPIRIN EC 325 MG PO TBEC
325.0000 mg | DELAYED_RELEASE_TABLET | Freq: Every day | ORAL | Status: AC
  Administered 2019-08-04: 325 mg via ORAL
  Filled 2019-08-04: qty 1

## 2019-08-04 MED ORDER — FUROSEMIDE 40 MG PO TABS
40.0000 mg | ORAL_TABLET | Freq: Two times a day (BID) | ORAL | Status: DC
  Administered 2019-08-05 – 2019-08-06 (×4): 40 mg via ORAL
  Filled 2019-08-04 (×4): qty 1

## 2019-08-04 MED ORDER — KETOROLAC TROMETHAMINE 15 MG/ML IJ SOLN
15.0000 mg | Freq: Four times a day (QID) | INTRAMUSCULAR | Status: AC
  Administered 2019-08-04 – 2019-08-05 (×5): 15 mg via INTRAVENOUS
  Filled 2019-08-04 (×5): qty 1

## 2019-08-04 MED ORDER — ENOXAPARIN SODIUM 30 MG/0.3ML ~~LOC~~ SOLN
30.0000 mg | Freq: Every day | SUBCUTANEOUS | Status: DC
  Administered 2019-08-05 – 2019-08-07 (×3): 30 mg via SUBCUTANEOUS
  Filled 2019-08-04 (×3): qty 0.3

## 2019-08-04 MED ORDER — INSULIN ASPART 100 UNIT/ML ~~LOC~~ SOLN: 0.0000 [IU] | SUBCUTANEOUS | Status: DC

## 2019-08-04 MED ORDER — ASPIRIN EC 81 MG PO TBEC
81.0000 mg | DELAYED_RELEASE_TABLET | Freq: Every day | ORAL | Status: DC
  Administered 2019-08-05 – 2019-08-06 (×2): 81 mg via ORAL
  Filled 2019-08-04 (×2): qty 1

## 2019-08-04 MED ORDER — BUPROPION HCL ER (XL) 300 MG PO TB24
300.0000 mg | ORAL_TABLET | Freq: Every day | ORAL | Status: DC
  Administered 2019-08-04 – 2019-08-08 (×5): 300 mg via ORAL
  Filled 2019-08-04 (×3): qty 2
  Filled 2019-08-04 (×2): qty 1

## 2019-08-04 MED ORDER — MONTELUKAST SODIUM 10 MG PO TABS
10.0000 mg | ORAL_TABLET | Freq: Every day | ORAL | Status: DC
  Administered 2019-08-04 – 2019-08-07 (×4): 10 mg via ORAL
  Filled 2019-08-04 (×4): qty 1

## 2019-08-04 MED ORDER — METOCLOPRAMIDE HCL 5 MG/ML IJ SOLN
10.0000 mg | Freq: Four times a day (QID) | INTRAMUSCULAR | Status: AC
  Administered 2019-08-04 (×3): 10 mg via INTRAVENOUS
  Filled 2019-08-04 (×3): qty 2

## 2019-08-04 MED ORDER — POTASSIUM CHLORIDE 10 MEQ/50ML IV SOLN
10.0000 meq | INTRAVENOUS | Status: AC
  Administered 2019-08-04 (×3): 10 meq via INTRAVENOUS
  Filled 2019-08-04 (×3): qty 50

## 2019-08-04 MED ORDER — WARFARIN - PHYSICIAN DOSING INPATIENT: Freq: Every day | Status: DC

## 2019-08-04 MED ORDER — WARFARIN SODIUM 2.5 MG PO TABS
2.5000 mg | ORAL_TABLET | Freq: Every day | ORAL | Status: DC
  Administered 2019-08-04 – 2019-08-06 (×3): 2.5 mg via ORAL
  Filled 2019-08-04 (×3): qty 1

## 2019-08-04 MED ORDER — FLUTICASONE FUROATE-VILANTEROL 100-25 MCG/INH IN AEPB
1.0000 | INHALATION_SPRAY | Freq: Every day | RESPIRATORY_TRACT | Status: DC
  Administered 2019-08-04 – 2019-08-08 (×5): 1 via RESPIRATORY_TRACT
  Filled 2019-08-04: qty 28

## 2019-08-04 MED ORDER — POTASSIUM CHLORIDE CRYS ER 20 MEQ PO TBCR
20.0000 meq | EXTENDED_RELEASE_TABLET | Freq: Two times a day (BID) | ORAL | Status: DC
  Administered 2019-08-05 – 2019-08-07 (×6): 20 meq via ORAL
  Filled 2019-08-04 (×7): qty 1

## 2019-08-04 MED FILL — Sodium Chloride IV Soln 0.9%: INTRAVENOUS | Qty: 2000 | Status: AC

## 2019-08-04 MED FILL — Heparin Sodium (Porcine) Inj 1000 Unit/ML: INTRAMUSCULAR | Qty: 10 | Status: AC

## 2019-08-04 MED FILL — Sodium Bicarbonate IV Soln 8.4%: INTRAVENOUS | Qty: 50 | Status: AC

## 2019-08-04 MED FILL — Mannitol IV Soln 20%: INTRAVENOUS | Qty: 500 | Status: AC

## 2019-08-04 MED FILL — Electrolyte-R (PH 7.4) Solution: INTRAVENOUS | Qty: 3000 | Status: AC

## 2019-08-04 NOTE — Progress Notes (Signed)
Patient ID: Anne Hogan, female   DOB: December 18, 1945, 74 y.o.   MRN: [id number] EVENING ROUNDS NOTE :     [address]       [place] [address]             [phone]                 1 Day Post-Op Procedure(s) (LRB): MINIMALLY INVASIVE MITRAL VALVE REPAIR (MVR) using Memo 4D 38 MM Mitral Valve. (Right) TRANSESOPHAGEAL ECHOCARDIOGRAM (TEE) (N/A) CLOSURE OF PATENT FORAMEN OVALE (N/A)  Total Length of Stay:  LOS: 1 day  BP (!) 98/54   Pulse 80   Temp 98.4 F (36.9 C) (Oral)   Resp 12   Ht 5' 2.5" (1.588 m)   Wt 88.3 kg   SpO2 96%   BMI 35.04 kg/m   .Intake/Output      01/14 0701 - 01/15 0700 01/15 0701 - 01/16 0700   P.O. 50 480   I.V. (mL/kg) 4137.8 (46.9) 9.5 (0.1)   Blood 278    IV Piggyback 786.9 149.9   Total Intake(mL/kg) 5252.7 (59.5) 639.4 (7.2)   Urine (mL/kg/hr) 4065 (1.9) 1550 (1.8)   Emesis/NG output 4    Blood 550    Chest Tube 310 140   Total Output 4929 1690   Net +323.7 -1050.6          . sodium chloride    . lactated ringers    . lactated ringers       Lab Results  Component Value Date   WBC 9.3 08/04/2019   HGB 10.3 (L) 08/04/2019   HCT 31.5 (L) 08/04/2019   PLT 150 08/04/2019   GLUCOSE 132 (H) 08/04/2019   ALT 16 07/24/2019   AST 22 07/24/2019   NA 137 08/04/2019   K 3.8 08/04/2019   CL 108 08/04/2019   CREATININE 0.72 08/04/2019   BUN 11 08/04/2019   CO2 22 08/04/2019   INR 1.4 (H) 08/03/2019   HGBA1C 5.2 07/24/2019   Stable day     Grace Isaac MD  Beeper [phone] Office [phone] 08/04/2019 5:01 PM

## 2019-08-04 NOTE — Progress Notes (Addendum)
[facility] DAILY ICU PROGRESS NOTE                   [address]            [place] [address]          [phone]   1 Day Post-Op Procedure(s) (LRB): MINIMALLY INVASIVE MITRAL VALVE REPAIR (MVR) using Memo 4D 38 MM Mitral Valve. (Right) TRANSESOPHAGEAL ECHOCARDIOGRAM (TEE) (N/A) CLOSURE OF PATENT FORAMEN OVALE (N/A)  Total Length of Stay:  LOS: 1 day   Subjective: Patient had nausea and emesis last night. She denies nausea this am. She is uncomfortable in bed and has incisional pain this am.  Objective: Vital signs in last 24 hours: Temp:  [95.4 F (35.2 C)-98.6 F (37 C)] 98.4 F (36.9 C) (01/15 0700) Pulse Rate:  [78-80] 79 (01/15 0700) Cardiac Rhythm: Atrial paced (01/15 0419) Resp:  [9-40] 15 (01/15 0700) BP: (82-128)/(49-85) 94/54 (01/15 0700) SpO2:  [89 %-100 %] 95 % (01/15 0700) Arterial Line BP: (103-133)/(48-76) 132/63 (01/15 0700) FiO2 (%):  [95 %] 95 % (01/14 1415) Weight:  [88.3 kg] 88.3 kg (01/15 0500)  Filed Weights   08/03/19 0616 08/04/19 0500  Weight: 77.1 kg 88.3 kg    Weight change: 11.2 kg   Hemodynamic parameters for last 24 hours: PAP: (24-36)/(9-18) 33/15 CVP:  [3 mmHg-16 mmHg] 15 mmHg CO:  [2.3 L/min-5 L/min] 5 L/min CI:  [1.3 L/min/m2-2.8 L/min/m2] 2.8 L/min/m2  Intake/Output from previous day: 01/14 0701 - 01/15 0700 In: 5252.7 [P.O.:50; I.V.:4137.8; Blood:278; IV Piggyback:786.9] Out: 9147 [Urine:4065; Emesis/NG output:4; Blood:550; Chest Tube:310]  Intake/Output this shift: No intake/output data recorded.  Current Meds: Scheduled Meds: . acetaminophen  1,000 mg Oral Q6H   Or  . acetaminophen (TYLENOL) oral liquid 160 mg/5 mL  1,000 mg Per Tube Q6H  . aspirin EC  325 mg Oral Daily   Or  . aspirin  324 mg Per Tube Daily  . bisacodyl  10 mg Oral Daily   Or  . bisacodyl  10 mg Rectal Daily  . chlorhexidine  15 mL Mouth/Throat NOW  . Chlorhexidine Gluconate Cloth  6 each Topical Daily  . docusate sodium  200 mg  Oral Daily  . insulin aspart  0-24 Units Subcutaneous Q4H  . [START ON 08/05/2019] pantoprazole  40 mg Oral Daily  . sodium chloride flush  3 mL Intravenous Q12H   Continuous Infusions: . sodium chloride Stopped (08/03/19 2127)  . sodium chloride    . sodium chloride Stopped (08/04/19 0435)  . albumin human 12.5 g (08/03/19 1445)  . dexmedetomidine (PRECEDEX) IV infusion Stopped (08/04/19 0453)  . lactated ringers    . lactated ringers    . levofloxacin (LEVAQUIN) IV    . milrinone 0.25 mcg/kg/min (08/04/19 0700)  . nitroGLYCERIN    . phenylephrine (NEO-SYNEPHRINE) Adult infusion Stopped (08/04/19 0500)   PRN Meds:.sodium chloride, albumin human, metoprolol tartrate, midazolam, morphine injection, ondansetron (ZOFRAN) IV, oxyCODONE, sodium chloride flush, traMADol  General appearance: alert, cooperative and no distress Neurologic: intact Heart: A paced, no murmur Lungs: Bibasilar diminished breath sounds Abdomen: Soft, non tender, sporadic bowel sounds present Extremities: mild bilateral LE edema Wound: Aquacel intact  Lab Results: CBC: Recent Labs    08/03/19 2032 08/04/19 0310  WBC 11.0* 9.3  HGB 11.4* 10.3*  HCT 34.6* 31.5*  PLT 165 150   BMET:  Recent Labs    08/03/19 2032 08/04/19 0310  NA 140 137  K 4.2 3.8  CL  111 108  CO2 24 22  GLUCOSE 140* 132*  BUN 12 11  CREATININE 0.79 0.72  CALCIUM 7.0* 7.2*    CMET: Lab Results  Component Value Date   WBC 9.3 08/04/2019   HGB 10.3 (L) 08/04/2019   HCT 31.5 (L) 08/04/2019   PLT 150 08/04/2019   GLUCOSE 132 (H) 08/04/2019   ALT 16 07/24/2019   AST 22 07/24/2019   NA 137 08/04/2019   K 3.8 08/04/2019   CL 108 08/04/2019   CREATININE 0.72 08/04/2019   BUN 11 08/04/2019   CO2 22 08/04/2019   INR 1.4 (H) 08/03/2019   HGBA1C 5.2 07/24/2019      PT/INR:  Recent Labs    08/03/19 1432  LABPROT 16.9*  INR 1.4*   Radiology: [facility] Chest Port 1 View  Result Date: 08/03/2019 CLINICAL DATA:  Atelectasis.  EXAM: PORTABLE CHEST 1 VIEW COMPARISON:  July 24, 2019. FINDINGS: Stable cardiomediastinal silhouette. Left internal jugular Swan-Ganz catheter is seen directed toward right pulmonary artery. Status post cardiac valve repair. Left lung is clear. Two right-sided chest tubes are noted with minimal right apical pneumothorax. No consolidative process is noted. Bony thorax is unremarkable. IMPRESSION: Two right-sided chest tubes are noted with minimal right apical pneumothorax. Electronically Signed   By: Marijo Conception M.D.   On: 08/03/2019 14:36   ECHO INTRAOPERATIVE TEE  Result Date: 08/04/2019  *INTRAOPERATIVE TRANSESOPHAGEAL REPORT *  Patient Name:   Anne Hogan Date of Exam: 08/03/2019 Medical Rec #:  [id number]     Height:       62.5 in Accession #:    [id number]    Weight:       170.0 lb Date of Birth:  09/30/1945      BSA:          1.79 m Patient Age:    74 years      BP:           133/83 mmHg Patient Gender: F             HR:           87 bpm. Exam Location:  Inpatient Transesophogeal exam was perform intraoperatively during surgical procedure. Patient was closely monitored under general anesthesia during the entirety of examination. Indications:     I34.1 Nonrheumatic mitral (valve) prolapse; I34.0 Nonrheumatic                  mitral (valve) insufficiency Performing Phys: [id number] Jozette Castrellon H Nijah Orlich Diagnosing Phys: Roberts Gaudy MD Complications: No known complications during this procedure. POST-OP IMPRESSIONS - Aortic Valve: The aortic valve appears unchanged from pre-bypass. - Tricuspid Valve: The tricuspid valve appears unchanged from pre-bypass. PRE-OP FINDINGS  Left Ventricle: The left ventricle has normal systolic function, with an ejection fraction of 60-65%. The cavity size was normal. There is no increase in left ventricular wall thickness. On the post-bypass exam, there was moderate global LV dysfunction with an ejection fraction estimated at 40% by visual estimate. Right Ventricle: The right  ventricle has normal systolic function. The cavity was normal. There is no increase in right ventricular wall thickness. On the post bypass exam, there was normal appearing RV systolic function at the conclusion of the procedure. Left Atrium: Left atrial size was dilated. The left atrium was enlarged and measured 6.6 cm in the anterior-posterior dimension and 5.9 cm in the medial- lateral dimension. The left atrial appendage is well visualized and there is evidence of thrombus present. Right Atrium:  Right atrial size was dilated. The interatrial septum is seen bowed toward the left, consistent with elevated right atrial pressures. Interatrial Septum: Evidence of atrial level shunting detected by color flow Doppler. There was a large patent foramen ovale seen on color doppler with continuous left to right flow. On the post-bypass exam, no patent foramen ovale or flow could be seen on color doppler. Pericardium: There is no evidence of pericardial effusion. Mitral Valve: The mitral valve is myxomatous. Mitral valve regurgitation is severe by color flow Doppler. There was severe mitral insufficiency with two flail leaflet segments involving the P2 scallop of the posterior leaflet. There was a suspicious area  of leaflet flail involving the P3 scallop at the posterior medial commisure. There was a difuse jet of mitral regurgitation which was anteriorly directed. There was systolic flow reversal in the left and right upper pulmonary veins. On the post-bypass exam, there was an annuloplasty ring in the mitral postion. The anterior leaflet was freely mobile and the posterior leaflet was fixed. There was no residual mitral regurgitation seen on color doppler using 2D and 3D imaging. The transmitral mean inflow gradient was 2 mm hg. by CW doppler. There was no evidence of systolic motion of the mitral valve and the LV outflow tract mean gradient was 3 mm hg by CW doppler. Tricuspid Valve: The tricuspid valve was normal in  structure. Tricuspid valve regurgitation is mild by color flow Doppler. The tricuspid valve is mildly thickened. The tricuspid valve leaflets appeared structurally normal. The tricuspid annulus diameter measured 3.78 cm. in the four chamber view and 3.68 cm. in the RV inflow-outflow view. There was mild to moderate tricuspid insufficiency with a central jet. Aortic Valve: The aortic valve is tricuspid There is mild thickening of the aortic valve Aortic valve regurgitation is mild by color flow Doppler. There is no evidence of aortic valve stenosis. There is no evidence of a vegetation on the aortic valve. Pulmonic Valve: The pulmonic valve was normal in structure, with normal. No evidence of pumonic stenosis. Pulmonic valve regurgitation is trivial by color flow Doppler. Aorta: The aortic root is normal in size and structure. The descending aorta was normal in diameter and was free of atheromatous disease.  Roberts Gaudy MD Electronically signed by Roberts Gaudy MD Signature Date/Time: 08/04/2019/7:13:27 AM    Final      Assessment/Plan: S/P Procedure(s) (LRB): MINIMALLY INVASIVE MITRAL VALVE REPAIR (MVR) using Memo 4D 11 MM Mitral Valve. (Right) TRANSESOPHAGEAL ECHOCARDIOGRAM (TEE) (N/A) CLOSURE OF PATENT FORAMEN OVALE (N/A) 1. CV-A paced at 80 this am. CO/CI 5.1/2.9. On Milrinone at 0.25 mcg/kg/hr. Will check coox. 2. Pulmonary-on 2 liters of oxygen via [place]. Chest tubes are to suction and with 310 cc last 24 hours. CXR this am appears stable. Encourage incentive spirometer and flutter valve. 3. ABL anemia-H and H this am slightly decreased to 10.3 and 31.5 4. Volume overload-will defer top Dr. Roxy Manns when to begin diuresis. She is not on Neo but BP somewhat labile this am 5. CBGs 121/103/110. Stop Insulin drip. Pre op HGA1C 5.2. Will stop accu checks and SS PRN once transferred to stepdown. 6. Supplement potassium  Donielle Liston Alba PA-C 08/04/2019 7:26 AM    I have seen and examined the patient  and agree with the assessment and plan as outlined.  Doing well POD1.  Maintaining NSR 70's - currently AAI paced w/ stable hemodynamics on very low dose milrinone.  Breathing comfortably w/ O2 sats 94-95% on room air and CXR looks  good.  Mobilize.  D/C milrinone.  D/C lines.  Start diuresis.  Start Coumadin.  Leave chest tubes in place at least 3 days or until output < 200 mL/day.  Possible transfer 4E tomorrow if doing well.  Rexene Alberts, MD 08/04/2019 8:15 AM

## 2019-08-04 NOTE — Discharge Summary (Signed)
Physician Discharge Summary       [address]       [place] [address]             [id number]    Patient ID: Anne Hogan MRN: [id number] DOB/AGE: Dec 13, 1945 74 y.o.  Admit date: 08/03/2019 Discharge date: 08/08/2019  Admission Diagnoses: Severe mitral valve regurgitation  Discharge Diagnoses:  1. S/P minimally-invasive mitral valve repair and S/P patent foramen ovale closure 2. ABL anemia 3. History of tricuspid regurgitation 4. Chronic diastolic congestive heart failure ([place]) 5. History of Incidental pulmonary nodule, greater than or equal to 37mm 6. History of ADD (attention deficit disorder) 7. History of arthritis 8. History of sleep apnea-not on CPAP 9. History of asthma 10. History of depression 11. History of GERD (gastroesophageal reflux disease) 12. History of squamous carcinoma-upper left groin   Consults: None  Procedure (s):   Minimally-Invasive Mitral Valve Repair             Complex valvuloplasty including artificial Gore-tex neochord placement x12             Sorin Memo 4D Ring Annuloplasty (size 50mm, catalog # [id number], serial # [id number])             Closure patent foramen ovale by Dr. Roxy Manns on 08/03/2019.  History of Presenting Illness: Patient is 74 year old moderately obese female with history of mitral valve prolapse, mitral regurgitation, and palpitations who has been referred for surgical consultation to discuss treatment options for management of severe symptomatic primary mitral regurgitation.  Patient states that she has been told that she had mitral valve prolapse for many years. Several other family members have had mitral valve prolapse and valvular heart disease. She has been followed for the last several years by Dr. Curt Bears because of history of palpitations. Previous echocardiograms have documented the presence of normal left ventricular systolic function with mitral valve prolapse and moderate to severe mitral  regurgitation. Long-term event monitor in 2019 revealed sinus rhythm with sinus tachycardia and rare PVCs as well as 8 brief runs of supraventricular tachycardia. Over the past year the patient has noticed increasing symptoms of exertional shortness of breath, particularly since August of this year. Transesophageal echocardiogram performed May 26, 2019 revealed normal left ventricular systolic function with mitral valve prolapse including an obvious flail segment of the middle scallop of the posterior leaflet and severe prolapse of the P3 segment of the posterior leaflet causing severe mitral regurgitation. There was additionally moderate to severe tricuspid regurgitation. Left and right heart catheterization was performed revealing normal coronary artery anatomy and no significant coronary artery disease. There was mild pulmonary hypertension with elevated pulmonary capillary wedge pressure consistent with severe mitral regurgitation. Cardiothoracic surgical consultation was requested.  Patient is widowed and lives locally in [place] by herself. She has been retired for several years having previously worked in [employer]. She enjoys playing the piano and painting. She lives a fairly sedentary lifestyle. She complains of exertional shortness of breath that she initially noticed last January but has become much more prominent over the past several months. She was started on Singulair and Breo Ellipta and noticed some improvement in her breathing at rest but she still gets short of breath with exertion. She denies resting shortness of breath, PND, orthopnea, or lower extremity edema. She has never had any significant chest pain or chest tightness. She reports occasional palpitations without dizzy spells or syncope.  Patient is a 73 year old female with history of mitral  valve prolapse, mitral regurgitation, palpitations, and remote history of tobacco use who returns the  office today for follow-up of mitral valve prolapse with stage D severe symptomatic primary mitral regurgitation. She was originally seen in consultation on May 29, 2019.Shortly after that she underwent CT angiography as part of her routine preoperative work-up for surgery and was incidentally found to have a lung mass located in the left lower lobe. Initial radiographic appearance revealed primarily groundglass opacity with small amounts of solid component potentially consistent with infection versus malignancy. Follow-up imaging was recommended and plans for mitral valve repair were postponed. Repeat CT scan performed July 20, 2019 revealed persistent large groundglass opacity with more pronounced solid components consistent with likely slow-growing primary lung cancer. Patient underwent FDG PET scan July 28, 2019 and returns her office today for follow-up. PET scan confirmed the presence of increased metabolic intake in the left lower lobe mass with SUV max of 3.47suspicious for primary bronchogenic carcinoma, possibly adenocarcinoma. There was no sign of increased uptake in the mediastinum or elsewhere in the body to suggest metastatic spread of disease.  Patient returns to our office today to discuss the results of the test further and make final plans for elective mitral valve repair. She reports no new problems or complaints over the past week. She specifically denies any productive cough, fevers, chills, hemoptysis, or recent exposure to persons with known or suspected COVID-19 infection. She describes stable symptoms of exertional shortness of breath and fatigue consistent with chronic diastolic congestive heart failure, New York Heart Association functional class IIb.  Dr. Roxy Manns again reviewed the results of the patient's recent PET CT scan with the patient and her close friend,Ms. [facility] Ward Black.The concerns that the lung mass likely represents primary lung cancer was  explained. The rationale for proceeding with elective mitral valve repair first followed by delayed definitive treatment of the patient's lung mass was explained at length.  The patient wasalsocounseled againregarding the indications, risks and potential benefits of mitral valve repair. The likelihood of successful and durable mitral valve repair has been discussed with particular reference to the findings of their recent echocardiogram. Based upon these findings and previous experience, Dr. Roxy Manns quoted them a greater than 95percent likelihood of successful valve repair with less than 1percent risk of mortality or major morbidity. Alternative surgical approaches have been discussed including a comparison between conventional sternotomy and minimally-invasive techniques. The relative risks and benefits of each have been reviewed as they pertain to the patient's specific circumstances, and expectations for the patient's postoperative convalescence has been discussed. In the unlikely event that the patient's valve cannot be successfully repaired, we discussed the possibility of replacing the mitral valve using a mechanical prosthesis with the attendant need for long-term anticoagulation versus the alternative of replacing it using a bioprosthetic tissue valve with its potential for late structural valve deterioration and failure, depending upon the patient's longevity. The patient specifically requests that if the mitral valve must be replaced that it be done using a bioprosthetic tissuevalve. Pre operative carotid duplex US showed no significant internal carotid artery stenosis bilaterally. Patient was admitted on 01/14 in order to undergo a minimally invasive mitral valve repair with complex valvuloplasty and closure of PFO.   Brief Hospital Course:  The patient was extubated the evening of surgery without difficulty. He/she remained afebrile and hemodynamically stable. She was initially AAI  paced at 76. She was weaned off Milrinone drip. Gordy Councilman, a line, and foley were removed early in the post  operative course. Chest tubes remained for a few days and once output was decreased, they were removed on 01/17. Lopressor was later started and titrated accordingly. She was started on Coumadin and her PT and INR were monitored daily. She was volume over loaded and diuresed.She had ABL anemia.She did not require a post op transfusion. Last H and H was 9.6 and 28.9. She was weaned off the insulin drip.  The patient's glucose remained well controlled.The patient's HGA1C pre op was 5.2. The patient was felt surgically stable for transfer from the ICU to [facility]U for further convalescence on 01/17 . She continues to progress with cardiac rehab. She was ambulating on room air. She has been tolerating a diet and has had a bowel movement. Epicardial pacing wires were removed on 01/18 .Her latest PT and INR are 14.5/1.1. Patient was instructed she may place 4x4's at left chest tube wounds (under breast) then apply tape. She may change daily and PRN. If there is no drainage, I instructed her to let open to the air. As discussed with Dr. Roxy Manns, the patient is felt surgically stable for discharge home today.   Latest Vital Signs: Blood pressure 105/65, pulse 66, temperature 98 F (36.7 C), temperature source Oral, resp. rate 18, height 5' 2.5" (1.588 m), weight 77.3 kg, SpO2 99 %.  Physical Exam: Cardiovascular: RRR, no murmur Pulmonary: Clear to auscultation bilaterally Abdomen: Soft, non tender, bowel sounds present. Extremities: Trace bilateral lower extremity edema. Wound: Clean and dry.  No erythema or signs of infection. Ecchymosis right breast but improving   Discharge Condition: Stable and discharged to home.  Recent laboratory studies:  Lab Results  Component Value Date   WBC 7.5 08/07/2019   HGB 9.6 (L) 08/07/2019   HCT 28.9 (L) 08/07/2019   MCV 89.5 08/07/2019   PLT 192 08/07/2019   Lab  Results  Component Value Date   NA 136 08/08/2019   K 4.4 08/08/2019   CL 101 08/08/2019   CO2 27 08/08/2019   CREATININE 0.99 08/08/2019   GLUCOSE 93 08/08/2019      Diagnostic Studies: DG Chest 2 View  Result Date: 08/07/2019 CLINICAL DATA:  Postoperative evaluation EXAM: CHEST - 2 VIEW COMPARISON:  Chest radiograph from one day prior. FINDINGS: Annuloplasty ring overlies the heart in stable position. Stable cardiomediastinal silhouette with top-normal heart size. Tiny (less than 5%) right apical pneumothorax status post removal of right chest tube. No left pneumothorax. No pleural effusion. Minimal hazy and reticular opacities in the peripheral right mid lung and at both lung bases, similar. IMPRESSION: 1. Tiny right apical pneumothorax status post removal of right chest tube. 2. Minimal hazy opacities in the mid to lower right lung and at the left lung base, similar, favor atelectasis. These results will be called to the ordering clinician or representative by the Radiologist Assistant, and communication documented in the PACS or zVision Dashboard. Electronically Signed   By: Ilona Sorrel M.D.   On: 08/07/2019 08:06   DG Chest 2 View  Result Date: 07/24/2019 CLINICAL DATA:  Mitral valve insufficiency. Pre-op respiratory exam from mitral valve repair. EXAM: CHEST - 2 VIEW COMPARISON:  03/19/2011 FINDINGS: The heart size and mediastinal contours are within normal limits. Both lungs are clear. The visualized skeletal structures are unremarkable. IMPRESSION: No active cardiopulmonary disease. Electronically Signed   By: Marlaine Hind M.D.   On: 07/24/2019 15:30   CT Chest W Contrast  Result Date: 07/20/2019 CLINICAL DATA:  Follow-up left lower lobe opacity seen  on prior CT dated 06/06/2019, preoperative MVR EXAM: CT CHEST WITH CONTRAST TECHNIQUE: Multidetector CT imaging of the chest was performed during intravenous contrast administration. CONTRAST:  49mL ISOVUE-300 IOPAMIDOL (ISOVUE-300)  INJECTION 61% COMPARISON:  CT chest pulmonary angiogram, 06/06/2019, CT abdomen pelvis, 04/04/2013 FINDINGS: Cardiovascular: Scattered aortic atherosclerosis. Enlargement of the left atrium. No pericardial effusion. Mediastinum/Nodes: No enlarged mediastinal, hilar, or axillary lymph nodes. Thyroid gland, trachea, and esophagus demonstrate no significant findings. Lungs/Pleura: Mild centrilobular emphysema. There is a redemonstrated mixed solid and ground-glass nodule of the left lower lobe. This is not significantly changed in overall size compared to prior examination, measuring 3.9 x 2.5 x 2.1 cm (series 8, image 88) however does demonstrate some increasing solid character, particularly in the inferior aspect (series 8, image 90, series 4, image 104). Additional stable, benign 4 mm nodule of the adjacent left lower lobe, seen on prior examination dated 04/04/2013 (series 8, image 97). There are additional scattered, nonspecific small ground-glass opacities most notably of the bilateral lung apices (series 8, image 25, 19). No pleural effusion or pneumothorax. Upper Abdomen: No acute abnormality. Musculoskeletal: No chest wall mass or suspicious bone lesions identified. IMPRESSION: 1. There is a redemonstrated mixed solid and ground-glass nodule of the left lower lobe. This is not significantly changed in overall size compared to prior examination, measuring 3.9 x 2.5 x 2.1 cm (series 8, image 88) however does demonstrate some increasing solid character, particularly in the inferior aspect (series 8, image 90, series 4, image 104). Persistence and increasing solidity are highly concerning for adenocarcinoma. Recommend tissue sampling and consideration of PET-CT for metabolic characterization (size of solid component should be sufficient to evaluate). 2. There are additional scattered, nonspecific small ground-glass opacities most notably of the bilateral lung apices, given appearance and distribution likely  infectious or inflammatory (series 8, image 25, 19). Attention on follow-up. 3. Mild centrilobular emphysema.  Emphysema (ICD10-J43.9). 4.  Aortic Atherosclerosis (ICD10-I70.0). These results will be called to the ordering clinician or representative by the Radiologist Assistant, and communication documented in the PACS or zVision Dashboard. Electronically Signed   By: Eddie Candle M.D.   On: 07/20/2019 17:04   NM PET Image Initial (PI) Skull Base To Thigh  Result Date: 07/28/2019 CLINICAL DATA:  Initial treatment strategy for pulmonary nodule. EXAM: NUCLEAR MEDICINE PET SKULL BASE TO THIGH TECHNIQUE: 8.5 mCi F-18 FDG was injected intravenously. Full-ring PET imaging was performed from the skull base to thigh after the radiotracer. CT data was obtained and used for attenuation correction and anatomic localization. Fasting blood glucose: 103 mg/dl COMPARISON:  07/20/2019 FINDINGS: Mediastinal blood pool activity: SUV max 2.8 Liver activity: SUV max NA NECK: No hypermetabolic lymph nodes in the neck. Incidental CT findings: none CHEST: The persistent left lower lobe lung nodule measures 2.5 x 2.0 cm and has an SUV max of 3.47. This is not significantly changed in size from 06/06/2019 but appears progressively more solid. Also in the left lower lobe is a 3 mm, too small to characterize lung nodule, image 43/8. Incidental CT findings: Centrilobular emphysema. Mild aortic atherosclerosis. ABDOMEN/PELVIS: No abnormal hypermetabolic activity within the liver, pancreas, adrenal glands, or spleen. No hypermetabolic lymph nodes in the abdomen or pelvis. Incidental CT findings: Chronic cysts within right hepatic lobe, similar to previous exam from 04/04/2013. Aortic atherosclerosis identified. 4.8 cm cyst arises from upper pole of the right kidney. Small indeterminate lesion arising from posterior cortex of left kidney measures 7 mm. This is too small to characterize. SKELETON: No  focal hypermetabolic activity to suggest  skeletal metastasis. Incidental CT findings: none IMPRESSION: Increased FDG uptake associated with left lower lobe lung nodule has an SUV max of 3.47. Findings are suspicious for primary bronchogenic carcinoma, possibly adenocarcinoma. Correlation with tissue sampling advised. No FDG avid thoracic lymph nodes or evidence of distant metastatic disease. Aortic Atherosclerosis (ICD10-I70.0) and Emphysema (ICD10-J43.9). Electronically Signed   By: Kerby Moors M.D.   On: 07/28/2019 10:04   DG Chest Port 1 View In am  Result Date: 08/06/2019 CLINICAL DATA:  Status post mitral valve repair. EXAM: PORTABLE CHEST 1 VIEW COMPARISON:  August 05, 2019. FINDINGS: Stable cardiomediastinal silhouette. Stable position of right-sided chest tubes without pneumothorax. Left lung is clear. Mild right basilar atelectasis is noted. Left internal jugular venous sheath is unchanged. Bony thorax is unremarkable. IMPRESSION: Stable position of right-sided chest tubes without pneumothorax. Mild right basilar subsegmental atelectasis. Electronically Signed   By: Marijo Conception M.D.   On: 08/06/2019 09:14   DG Chest Port 1 View  Result Date: 08/05/2019 CLINICAL DATA:  Status post minimally invasive mitral valve repair. EXAM: PORTABLE CHEST 1 VIEW COMPARISON:  08/04/2019 FINDINGS: The cardiac silhouette, mediastinal and hilar contours are stable. Stable epicardial pacer wires. The Swan-Ganz catheter has been removed. The left IJ Cordis is still in place. Right-sided chest tubes are stable. No definite pneumothorax. No pulmonary edema or pleural effusions. Persistent right basilar atelectasis. IMPRESSION: 1. Postoperative support apparatus in good position without definite residual pneumothorax. 2. Removal of Swan-Ganz catheter. 3. Persistent right basilar atelectasis. Electronically Signed   By: Marijo Sanes M.D.   On: 08/05/2019 09:58   DG Chest Port 1 View  Result Date: 08/04/2019 CLINICAL DATA:  Atelectasis.  Pneumothorax.  EXAM: PORTABLE CHEST 1 VIEW COMPARISON:  08/03/2019 FINDINGS: Two chest tubes on the right remain in place. Tiny right apical pneumothorax has nearly resolved. Mild bibasilar airspace disease with slight progression. Swan-Ganz catheter in the right pulmonary artery. Negative for pulmonary edema. IMPRESSION: Two chest tubes on the right remain in place. Near complete resolution of tiny right apical pneumothorax Mild bibasilar airspace disease with slight progression. Electronically Signed   By: Franchot Gallo M.D.   On: 08/04/2019 08:02   DG Chest Port 1 View  Result Date: 08/03/2019 CLINICAL DATA:  Atelectasis. EXAM: PORTABLE CHEST 1 VIEW COMPARISON:  July 24, 2019. FINDINGS: Stable cardiomediastinal silhouette. Left internal jugular Swan-Ganz catheter is seen directed toward right pulmonary artery. Status post cardiac valve repair. Left lung is clear. Two right-sided chest tubes are noted with minimal right apical pneumothorax. No consolidative process is noted. Bony thorax is unremarkable. IMPRESSION: Two right-sided chest tubes are noted with minimal right apical pneumothorax. Electronically Signed   By: Marijo Conception M.D.   On: 08/03/2019 14:36   ECHO INTRAOPERATIVE TEE  Result Date: 08/04/2019  *INTRAOPERATIVE TRANSESOPHAGEAL REPORT *  Patient Name:   LANYA BUCKS Date of Exam: 08/03/2019 Medical Rec #:  [id number]     Height:       62.5 in Accession #:    [id number]    Weight:       170.0 lb Date of Birth:  1946-07-03      BSA:          1.79 m Patient Age:    89 years      BP:           133/83 mmHg Patient Gender: F  HR:           87 bpm. Exam Location:  Inpatient Transesophogeal exam was perform intraoperatively during surgical procedure. Patient was closely monitored under general anesthesia during the entirety of examination. Indications:     I34.1 Nonrheumatic mitral (valve) prolapse; I34.0 Nonrheumatic                  mitral (valve) insufficiency Performing Phys: [id number] CLARENCE H OWEN  Diagnosing Phys: Roberts Gaudy MD Complications: No known complications during this procedure. POST-OP IMPRESSIONS - Aortic Valve: The aortic valve appears unchanged from pre-bypass. - Tricuspid Valve: The tricuspid valve appears unchanged from pre-bypass. PRE-OP FINDINGS  Left Ventricle: The left ventricle has normal systolic function, with an ejection fraction of 60-65%. The cavity size was normal. There is no increase in left ventricular wall thickness. On the post-bypass exam, there was moderate global LV dysfunction with an ejection fraction estimated at 40% by visual estimate. Right Ventricle: The right ventricle has normal systolic function. The cavity was normal. There is no increase in right ventricular wall thickness. On the post bypass exam, there was normal appearing RV systolic function at the conclusion of the procedure. Left Atrium: Left atrial size was dilated. The left atrium was enlarged and measured 6.6 cm in the anterior-posterior dimension and 5.9 cm in the medial- lateral dimension. The left atrial appendage is well visualized and there is evidence of thrombus present. Right Atrium: Right atrial size was dilated. The interatrial septum is seen bowed toward the left, consistent with elevated right atrial pressures. Interatrial Septum: Evidence of atrial level shunting detected by color flow Doppler. There was a large patent foramen ovale seen on color doppler with continuous left to right flow. On the post-bypass exam, no patent foramen ovale or flow could be seen on color doppler. Pericardium: There is no evidence of pericardial effusion. Mitral Valve: The mitral valve is myxomatous. Mitral valve regurgitation is severe by color flow Doppler. There was severe mitral insufficiency with two flail leaflet segments involving the P2 scallop of the posterior leaflet. There was a suspicious area  of leaflet flail involving the P3 scallop at the posterior medial commisure. There was a difuse jet of  mitral regurgitation which was anteriorly directed. There was systolic flow reversal in the left and right upper pulmonary veins. On the post-bypass exam, there was an annuloplasty ring in the mitral postion. The anterior leaflet was freely mobile and the posterior leaflet was fixed. There was no residual mitral regurgitation seen on color doppler using 2D and 3D imaging. The transmitral mean inflow gradient was 2 mm hg. by CW doppler. There was no evidence of systolic motion of the mitral valve and the LV outflow tract mean gradient was 3 mm hg by CW doppler. Tricuspid Valve: The tricuspid valve was normal in structure. Tricuspid valve regurgitation is mild by color flow Doppler. The tricuspid valve is mildly thickened. The tricuspid valve leaflets appeared structurally normal. The tricuspid annulus diameter measured 3.78 cm. in the four chamber view and 3.68 cm. in the RV inflow-outflow view. There was mild to moderate tricuspid insufficiency with a central jet. Aortic Valve: The aortic valve is tricuspid There is mild thickening of the aortic valve Aortic valve regurgitation is mild by color flow Doppler. There is no evidence of aortic valve stenosis. There is no evidence of a vegetation on the aortic valve. Pulmonic Valve: The pulmonic valve was normal in structure, with normal. No evidence of pumonic stenosis. Pulmonic valve regurgitation is trivial  by color flow Doppler. Aorta: The aortic root is normal in size and structure. The descending aorta was normal in diameter and was free of atheromatous disease.  Roberts Gaudy MD Electronically signed by Roberts Gaudy MD Signature Date/Time: 08/04/2019/7:13:27 AM    Final    VAS US DOPPLER PRE CABG  Result Date: 07/24/2019 PREOPERATIVE VASCULAR EVALUATION  Risk Factors: Coronary artery disease. Performing Technologist: Oda Cogan RDMS, RVT  Examination Guidelines: A complete evaluation includes B-mode imaging, spectral Doppler, color Doppler, and power Doppler  as needed of all accessible portions of each vessel. Bilateral testing is considered an integral part of a complete examination. Limited examinations for reoccurring indications may be performed as noted.  Right Carotid Findings: +----------+--------+--------+--------+--------+------------------+           PSV cm/sEDV cm/sStenosisDescribeComments           +----------+--------+--------+--------+--------+------------------+ CCA Prox  96      24                                         +----------+--------+--------+--------+--------+------------------+ CCA Distal103     27                                         +----------+--------+--------+--------+--------+------------------+ ICA Prox  78      28      1-39%           intimal thickening +----------+--------+--------+--------+--------+------------------+ ICA Distal106     35                                         +----------+--------+--------+--------+--------+------------------+ ECA       93      20                                         +----------+--------+--------+--------+--------+------------------+ Portions of this table do not appear on this page. +----------+--------+-------+----------------+------------+           PSV cm/sEDV cmsDescribe        Arm Pressure +----------+--------+-------+----------------+------------+ Subclavian130            Multiphasic, [id number]          +----------+--------+-------+----------------+------------+ +---------+--------+--+--------+--+---------+ VertebralPSV cm/s47EDV cm/s16Antegrade +---------+--------+--+--------+--+---------+ Left Carotid Findings: +----------+--------+--------+--------+--------+--------+           PSV cm/sEDV cm/sStenosisDescribeComments +----------+--------+--------+--------+--------+--------+ CCA Prox  117     26                               +----------+--------+--------+--------+--------+--------+ CCA Distal87      23                                +----------+--------+--------+--------+--------+--------+ ICA Prox  94      28                               +----------+--------+--------+--------+--------+--------+ ICA Distal80      31                               +----------+--------+--------+--------+--------+--------+  ECA       81      19                               +----------+--------+--------+--------+--------+--------+ +----------+--------+--------+----------------+------------+ SubclavianPSV cm/sEDV cm/sDescribe        Arm Pressure +----------+--------+--------+----------------+------------+           177             Multiphasic, [id number]          +----------+--------+--------+----------------+------------+ +---------+--------+--+--------+--+---------+ VertebralPSV cm/s55EDV cm/s19Antegrade +---------+--------+--+--------+--+---------+  ABI Findings: +--------+------------------+-----+---------+--------+ Right   Rt Pressure (mmHg)IndexWaveform Comment  +--------+------------------+-----+---------+--------+ [id number]                    triphasic         +--------+------------------+-----+---------+--------+ +--------+------------------+-----+---------+-------+ Left    Lt Pressure (mmHg)IndexWaveform Comment +--------+------------------+-----+---------+-------+ [id number]                    triphasic        +--------+------------------+-----+---------+-------+  Right Doppler Findings: +-----------+--------+-----+---------+--------+ Site       PressureIndexDoppler  Comments +-----------+--------+-----+---------+--------+ Brachial   122          triphasic         +-----------+--------+-----+---------+--------+ Radial                  triphasic         +-----------+--------+-----+---------+--------+ Ulnar                   triphasic         +-----------+--------+-----+---------+--------+ Palmar Arch                      WNL       +-----------+--------+-----+---------+--------+  Left Doppler Findings: +-----------+--------+-----+---------+--------+ Site       PressureIndexDoppler  Comments +-----------+--------+-----+---------+--------+ Brachial   115          triphasic         +-----------+--------+-----+---------+--------+ Radial                  triphasic         +-----------+--------+-----+---------+--------+ Ulnar                   triphasic         +-----------+--------+-----+---------+--------+ Palmar Arch                      WNL      +-----------+--------+-----+---------+--------+  Summary: Right Carotid: Velocities in the right ICA are consistent with a 1-39% stenosis. Left Carotid: Velocities in the left ICA are consistent with a 1-39% stenosis. Bilateral Extremity: Doppler waveforms remain within normal limits with compression bilaterally for the radial arteries. Doppler waveforms remain within normal limits with compression bilaterally for the ulnar arteries.  Electronically signed by Harold Barban MD on 07/24/2019 at 3:34:24 PM.    Final          Discharge Medications: Allergies as of 08/08/2019      Reactions   Penicillins Rash   Has patient had a PCN reaction causing immediate rash, facial/tongue/throat swelling, SOB or lightheadedness with hypotension: unknown Has patient had a PCN reaction causing severe rash involving mucus membranes or skin necrosis: no Has patient had a PCN reaction that required hospitalization: unknown Has patient had a PCN reaction occurring within the last 10 years: no If all of the above answers are "NO", then may proceed  with Cephalosporin use.   Codeine Other (See Comments)   Possible sensitivity, patient not sure   Milk-related Compounds Nausea And Vomiting, Other (See Comments)   Can have ice cream, pt just doesn't want to drink milk   Morphine And Related    "strange sensation across midsection and chest"      Medication List    STOP taking  these medications   aspirin 81 MG tablet Replaced by: aspirin 81 MG EC tablet     TAKE these medications   acetaminophen 500 MG tablet Commonly known as: TYLENOL Take 500-1,000 mg by mouth every 6 (six) hours as needed for moderate pain (depends on pain if takes 1-2 tablets).   albuterol 108 (90 Base) MCG/ACT inhaler Commonly known as: VENTOLIN HFA Inhale 1-2 puffs into the lungs every 4 (four) hours as needed for wheezing or shortness of breath. PRO AIR   albuterol (2.5 MG/3ML) 0.083% nebulizer solution Commonly known as: PROVENTIL Take 2.5 mg by nebulization every 4 (four) hours as needed for shortness of breath.   amiodarone 200 MG tablet Commonly known as: PACERONE Take 1 tablet (200 mg total) by mouth daily.   aspirin 81 MG EC tablet Take 1 tablet (81 mg total) by mouth daily. Replaces: aspirin 81 MG tablet   Biotin 5000 MCG Tabs Take 5,000 mcg by mouth daily.   Breo Ellipta 100-25 MCG/INH Aepb Generic drug: fluticasone furoate-vilanterol USE 1 PUFF DAILY AS DIRECTED What changed:   how much to take  how to take this  when to take this  additional instructions   buPROPion 300 MG 24 hr tablet Commonly known as: WELLBUTRIN XL Take 300 mg by mouth daily.   CALCIUM/VITAMIN D3 GUMMIES PO Take 2 each by mouth 2 (two) times daily. Alive Woman's over 50   furosemide 40 MG tablet Commonly known as: LASIX Take 1 tablet (40 mg total) by mouth daily. For 5 days then stop.   hydroxypropyl methylcellulose / hypromellose 2.5 % ophthalmic solution Commonly known as: ISOPTO TEARS / GONIOVISC Place 1 drop into both eyes daily as needed for dry eyes.   metoprolol tartrate 25 MG tablet Commonly known as: LOPRESSOR Take 0.5 tablets (12.5 mg total) by mouth 2 (two) times daily.   montelukast 10 MG tablet Commonly known as: SINGULAIR Take 10 mg by mouth at bedtime.   multivitamin with minerals tablet Take 1 tablet by mouth daily. Master's complete   potassium  chloride 10 MEQ tablet Commonly known as: KLOR-CON Take 1 tablet (10 mEq total) by mouth daily. For 5 days then stop.   traMADol 50 MG tablet Commonly known as: ULTRAM Take 1 tablet (50 mg total) by mouth every 4 (four) hours as needed for severe pain.   Vitamin B-12 5000 MCG Tbdp Take 5,000 mcg by mouth daily.   Vitamin D (Ergocalciferol) 1.25 MG (50000 UNIT) Caps capsule Commonly known as: DRISDOL Take 50,000 Units by mouth 2 (two) times a week. Nancy Fetter and Thurs   warfarin 5 MG tablet Commonly known as: COUMADIN Take 1 tablet (5 mg total) by mouth daily at 6 PM. Or as directed.      The patient has been discharged on:   1.Beta Blocker:  Yes [ x  ]                              No   [   ]  If No, reason:  2.Ace Inhibitor/ARB: Yes [   ]                                     No  [  x  ]                                     If No, reason:Labile BP  3.Statin:   Yes [   ]                  No  [ x  ]                  If No, reason:No CAD  4.Shela Commons:  Yes  [  x ]                  No   [   ]                  If No, reason:  Follow Up Appointments: Follow-up Information    Rexene Alberts, MD. Go on 08/28/2019.   Specialty: Cardiothoracic Surgery Why: PA/LAT CXR to be taken (at [facility] which is in the same building as Dr. Guy Sandifer office) on 02/08 at 12:30 pm;Appointment time is at 1:00 pm with physician assistant Contact information: [place] [address] [phone]        [place]. Go on 08/10/2019.   Specialty: Cardiology Why: Appointment is for PT and INR to be drawn (s/p mini mitral valve repair). Appointement time is at 10:30 am Contact information: [address] [place] [phone]       Shirley Friar, [place]. Go on 08/22/2019.   Specialty: Physician Assistant Why: Appointment time is at 8:25 am. Please make sure echo arranged  for about 6 weeks post op  Contact information: [place] [address] [phone]        [facility]. Go on 08/21/2019.   Why: Appointment is with nurse only to have chest tube sutures removed. Appointment time is at 10:00 am Contact information: [place] [address] [phone]           Signed: Sharalyn Ink [facility] 08/08/2019, 9:23 AM

## 2019-08-04 NOTE — Addendum Note (Signed)
Addendum  created 08/04/19 0917 by Josephine Igo, CRNA   Order list changed

## 2019-08-05 ENCOUNTER — Inpatient Hospital Stay (HOSPITAL_COMMUNITY): Payer: PPO

## 2019-08-05 LAB — POCT I-STAT EG7
Acid-Base Excess: 3 mmol/L — ABNORMAL HIGH (ref 0.0–2.0)
Bicarbonate: 27.6 mmol/L (ref 20.0–28.0)
Calcium, Ion: 1.13 mmol/L — ABNORMAL LOW (ref 1.15–1.40)
HCT: 29 % — ABNORMAL LOW (ref 36.0–46.0)
Hemoglobin: 9.9 g/dL — ABNORMAL LOW (ref 12.0–15.0)
O2 Saturation: 43 %
Patient temperature: 98.7
Potassium: 3.9 mmol/L (ref 3.5–5.1)
Sodium: 141 mmol/L (ref 135–145)
TCO2: 29 mmol/L (ref 22–32)
pCO2, Ven: 42.9 mmHg — ABNORMAL LOW (ref 44.0–60.0)
pH, Ven: 7.417 (ref 7.250–7.430)
pO2, Ven: 24 mmHg — CL (ref 32.0–45.0)

## 2019-08-05 LAB — PROTIME-INR
INR: 1.2 (ref 0.8–1.2)
Prothrombin Time: 15.2 seconds (ref 11.4–15.2)

## 2019-08-05 LAB — CBC
HCT: 29.6 % — ABNORMAL LOW (ref 36.0–46.0)
Hemoglobin: 9.6 g/dL — ABNORMAL LOW (ref 12.0–15.0)
MCH: 30 pg (ref 26.0–34.0)
MCHC: 32.4 g/dL (ref 30.0–36.0)
MCV: 92.5 fL (ref 80.0–100.0)
Platelets: 148 10*3/uL — ABNORMAL LOW (ref 150–400)
RBC: 3.2 MIL/uL — ABNORMAL LOW (ref 3.87–5.11)
RDW: 12.5 % (ref 11.5–15.5)
WBC: 8.4 10*3/uL (ref 4.0–10.5)
nRBC: 0 % (ref 0.0–0.2)

## 2019-08-05 LAB — TSH: TSH: 2.265 u[IU]/mL (ref 0.350–4.500)

## 2019-08-05 LAB — BASIC METABOLIC PANEL
Anion gap: 5 (ref 5–15)
BUN: 14 mg/dL (ref 8–23)
CO2: 27 mmol/L (ref 22–32)
Calcium: 7.5 mg/dL — ABNORMAL LOW (ref 8.9–10.3)
Chloride: 102 mmol/L (ref 98–111)
Creatinine, Ser: 0.96 mg/dL (ref 0.44–1.00)
GFR calc Af Amer: >60 mL/min (ref >60)
GFR calc non Af Amer: 59 mL/min — ABNORMAL LOW (ref >60)
Glucose, Bld: 102 mg/dL — ABNORMAL HIGH (ref 70–99)
Potassium: 3.7 mmol/L (ref 3.5–5.1)
Sodium: 134 mmol/L — ABNORMAL LOW (ref 135–145)

## 2019-08-05 LAB — GLUCOSE, CAPILLARY
Glucose-Capillary: 95 mg/dL (ref 70–99)
Glucose-Capillary: 89 mg/dL (ref 70–99)
Glucose-Capillary: 99 mg/dL (ref 70–99)

## 2019-08-05 MED ORDER — SODIUM CHLORIDE 0.9% FLUSH: 10.0000 mL | INTRAVENOUS | Status: DC | PRN

## 2019-08-05 MED ORDER — AMIODARONE LOAD VIA INFUSION
150.0000 mg | Freq: Once | INTRAVENOUS | Status: AC
  Administered 2019-08-05: 150 mg via INTRAVENOUS
  Filled 2019-08-05: qty 83.34

## 2019-08-05 MED ORDER — AMIODARONE HCL IN DEXTROSE 360-4.14 MG/200ML-% IV SOLN: 30.0000 mg/h | INTRAVENOUS | Status: DC

## 2019-08-05 MED ORDER — SODIUM CHLORIDE 0.9% FLUSH
10.0000 mL | Freq: Two times a day (BID) | INTRAVENOUS | Status: DC
  Administered 2019-08-05 – 2019-08-06 (×3): 10 mL

## 2019-08-05 MED ORDER — POTASSIUM CHLORIDE CRYS ER 20 MEQ PO TBCR
40.0000 meq | EXTENDED_RELEASE_TABLET | Freq: Once | ORAL | Status: AC
  Administered 2019-08-05: 40 meq via ORAL
  Filled 2019-08-05: qty 2

## 2019-08-05 MED ORDER — AMIODARONE HCL 200 MG PO TABS
200.0000 mg | ORAL_TABLET | Freq: Every day | ORAL | Status: DC
  Administered 2019-08-08: 09:00:00 200 mg via ORAL
  Filled 2019-08-05: qty 1

## 2019-08-05 MED ORDER — AMIODARONE HCL IN DEXTROSE 360-4.14 MG/200ML-% IV SOLN
60.0000 mg/h | INTRAVENOUS | Status: DC
  Administered 2019-08-05 – 2019-08-06 (×2): 60 mg/h via INTRAVENOUS
  Filled 2019-08-05 (×2): qty 200

## 2019-08-05 MED ORDER — AMIODARONE HCL 200 MG PO TABS
200.0000 mg | ORAL_TABLET | Freq: Two times a day (BID) | ORAL | Status: AC
  Administered 2019-08-06 – 2019-08-07 (×4): 200 mg via ORAL
  Filled 2019-08-05 (×4): qty 1

## 2019-08-05 MED ORDER — ~~LOC~~ CARDIAC SURGERY, PATIENT & FAMILY EDUCATION: Freq: Once | Status: AC

## 2019-08-05 MED ORDER — COUMADIN BOOK
Freq: Once | Status: AC
  Filled 2019-08-05: qty 1

## 2019-08-05 MED ORDER — POTASSIUM CHLORIDE 10 MEQ/50ML IV SOLN
10.0000 meq | INTRAVENOUS | Status: AC
  Administered 2019-08-05 (×3): 10 meq via INTRAVENOUS
  Filled 2019-08-05 (×3): qty 50

## 2019-08-05 NOTE — Addendum Note (Signed)
Addendum  created 08/05/19 2058 by Roberts Gaudy, MD   Clinical Note Signed

## 2019-08-05 NOTE — Progress Notes (Signed)
Nurse noticed pt was in a-fib at shift change. Nurse conducted an ECG to confirm a-fib. MD was notified and instructed nurse to start amiodarone. After the initial 150 bolus of amiodarone and about an hour run of the 60mg  dose, pt has came out of a-fib and now has NSR with a steady HR of 65-66.

## 2019-08-05 NOTE — Progress Notes (Signed)
Patient ID: Anne Hogan, female   DOB: Feb 08, 1946, 74 y.o.   MRN: [id number] TCTS DAILY ICU PROGRESS NOTE                   [address]            [place] [address]          [phone]   2 Days Post-Op Procedure(s) (LRB): MINIMALLY INVASIVE MITRAL VALVE REPAIR (MVR) using Memo 4D 38 MM Mitral Valve. (Right) TRANSESOPHAGEAL ECHOCARDIOGRAM (TEE) (N/A) CLOSURE OF PATENT FORAMEN OVALE (N/A)  Total Length of Stay:  LOS: 2 days   Subjective: Patient awake alert neurologically intact, feels well this morning, walked in the unit, chest tube still in place  Objective: Vital signs in last 24 hours: Temp:  [97.7 F (36.5 C)-99.1 F (37.3 C)] 98.3 F (36.8 C) (01/16 0800) Pulse Rate:  [66-81] 66 (01/16 0800) Cardiac Rhythm: Normal sinus rhythm (01/16 0743) Resp:  [12-27] 15 (01/16 0800) BP: (94-135)/(51-72) 132/59 (01/16 0800) SpO2:  [92 %-98 %] 95 % (01/16 0800) Weight:  [83.1 kg] 83.1 kg (01/16 0500)  Filed Weights   08/03/19 0616 08/04/19 0500 08/05/19 0500  Weight: 77.1 kg 88.3 kg 83.1 kg    Weight change: -5.201 kg   Hemodynamic parameters for last 24 hours: CO:  [5.9 L/min] 5.9 L/min CI:  [3.3 L/min/m2] 3.3 L/min/m2  Intake/Output from previous day: 01/15 0701 - 01/16 0700 In: 959.7 [P.O.:600; I.V.:9.5; IV Piggyback:350.2] Out: 5176 [Urine:2910; Chest Tube:260]  Intake/Output this shift: Total I/O In: -  Out: 275 [Urine:275]  Current Meds: Scheduled Meds: . acetaminophen  1,000 mg Oral Q6H  . aspirin EC  81 mg Oral Daily  . bisacodyl  10 mg Oral Daily   Or  . bisacodyl  10 mg Rectal Daily  . buPROPion  300 mg Oral Daily  . Chlorhexidine Gluconate Cloth  6 each Topical Daily  . docusate sodium  200 mg Oral Daily  . enoxaparin (LOVENOX) injection  30 mg Subcutaneous QHS  . fluticasone furoate-vilanterol  1 puff Inhalation Daily  . furosemide  40 mg Oral BID  . insulin aspart  0-24 Units Subcutaneous Q4H  . montelukast  10 mg Oral QHS  .  pantoprazole  40 mg Oral Daily  . potassium chloride  20 mEq Oral BID WC  . sodium chloride flush  10-40 mL Intracatheter Q12H  . sodium chloride flush  3 mL Intravenous Q12H  . warfarin  2.5 mg Oral q1800  . Warfarin - Physician Dosing Inpatient   Does not apply q1800   Continuous Infusions: . sodium chloride    . lactated ringers    . lactated ringers    . potassium chloride 10 mEq (08/05/19 0909)   PRN Meds:.metoprolol tartrate, morphine injection, ondansetron (ZOFRAN) IV, oxyCODONE, sodium chloride flush, sodium chloride flush, traMADol  General appearance: alert, cooperative and no distress Neurologic: intact Heart: regular rate and rhythm, S1, S2 normal, no murmur, click, rub or gallop Lungs: clear to auscultation bilaterally Abdomen: soft, non-tender; bowel sounds normal; no masses,  no organomegaly Extremities: extremities normal, atraumatic, no cyanosis or edema and Homans sign is negative, no sign of DVT Wound: Chest tubes in place no air leak  Lab Results: CBC: Recent Labs    08/04/19 1705 08/05/19 0400  WBC 10.4 8.4  HGB 9.9* 9.6*  HCT 30.3* 29.6*  PLT 142* 148*   BMET:  Recent Labs    08/04/19 1705 08/05/19 0400  NA 135  134*  K 3.4* 3.7  CL 104 102  CO2 24 27  GLUCOSE 96 102*  BUN 11 14  CREATININE 0.96 0.96  CALCIUM 7.2* 7.5*    CMET: Lab Results  Component Value Date   WBC 8.4 08/05/2019   HGB 9.6 (L) 08/05/2019   HCT 29.6 (L) 08/05/2019   PLT 148 (L) 08/05/2019   GLUCOSE 102 (H) 08/05/2019   ALT 16 07/24/2019   AST 22 07/24/2019   NA 134 (L) 08/05/2019   K 3.7 08/05/2019   CL 102 08/05/2019   CREATININE 0.96 08/05/2019   BUN 14 08/05/2019   CO2 27 08/05/2019   INR 1.2 08/05/2019   HGBA1C 5.2 07/24/2019      PT/INR:  Recent Labs    08/05/19 0400  LABPROT 15.2  INR 1.2   Radiology: No results found.   Assessment/Plan: S/P Procedure(s) (LRB): MINIMALLY INVASIVE MITRAL VALVE REPAIR (MVR) using Memo 4D 38 MM Mitral Valve.  (Right) TRANSESOPHAGEAL ECHOCARDIOGRAM (TEE) (N/A) CLOSURE OF PATENT FORAMEN OVALE (N/A) Mobilize Started on Coumadin Leave chest tubes 1 more day Expected Acute  Blood - loss Anemia- continue to monitor  DC Foley    Anne Hogan 08/05/2019 9:29 AM

## 2019-08-05 NOTE — Progress Notes (Signed)
Patient ID: Anne Hogan, female   DOB: 1945/11/27, 74 y.o.   MRN: [id number] EVENING ROUNDS NOTE :     [address]       ,[place] [address]             [phone]                 2 Days Post-Op Procedure(s) (LRB): MINIMALLY INVASIVE MITRAL VALVE REPAIR (MVR) using Memo 4D 38 MM Mitral Valve. (Right) TRANSESOPHAGEAL ECHOCARDIOGRAM (TEE) (N/A) CLOSURE OF PATENT FORAMEN OVALE (N/A)  Total Length of Stay:  LOS: 2 days  BP 108/68   Pulse 70   Temp 98.9 F (37.2 C) (Oral)   Resp 16   Ht 5' 2.5" (1.588 m)   Wt 83.1 kg   SpO2 98%   BMI 32.97 kg/m   .Intake/Output      01/15 0701 - 01/16 0700 01/16 0701 - 01/17 0700   P.O. 600 240   I.V. (mL/kg) 9.5 (0.1)    Blood     IV Piggyback 350.2 100   Total Intake(mL/kg) 959.7 (11.5) 340 (4.1)   Urine (mL/kg/hr) 2910 (1.5) 2400 (2.4)   Emesis/NG output     Stool  0   Blood     Chest Tube 260 30   Total Output 3170 2430   Net -2210.3 -2090        Stool Occurrence  3 x     . sodium chloride    . lactated ringers       Lab Results  Component Value Date   WBC 8.4 08/05/2019   HGB 9.6 (L) 08/05/2019   HCT 29.6 (L) 08/05/2019   PLT 148 (L) 08/05/2019   GLUCOSE 102 (H) 08/05/2019   ALT 16 07/24/2019   AST 22 07/24/2019   NA 134 (L) 08/05/2019   K 3.7 08/05/2019   CL 102 08/05/2019   CREATININE 0.96 08/05/2019   BUN 14 08/05/2019   CO2 27 08/05/2019   INR 1.2 08/05/2019   HGBA1C 5.2 07/24/2019   260 from chest tubes Stable day Holding sinus Foley out-voiding without problem   Grace Isaac MD  Beeper [phone] Office [phone] 08/05/2019 6:51 PM

## 2019-08-05 NOTE — Progress Notes (Signed)
Anesthesiology Follow-up:  Awake and alert, neuro intact, in good spirits. Walking with assistance hemodynamically stable in SR.  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 Extubated in OR.Stable post-op course.  Dot Been

## 2019-08-05 NOTE — Progress Notes (Signed)
  Amiodarone Drug - Drug Interaction Consult Note  Recommendations: No adjustments at this time - monitor warfarin impact on warfarin.   Amiodarone is metabolized by the cytochrome P450 system and therefore has the potential to cause many drug interactions. Amiodarone has an average plasma half-life of 50 days (range 20 to 100 days).   There is potential for drug interactions to occur several weeks or months after stopping treatment and the onset of drug interactions may be slow after initiating amiodarone.   []  Statins: Increased risk of myopathy. Simvastatin- restrict dose to 20mg  daily. Other statins: counsel patients to report any muscle pain or weakness immediately.  [x]  Anticoagulants: Amiodarone can increase anticoagulant effect. Consider warfarin dose reduction. Patients should be monitored closely and the dose of anticoagulant altered accordingly, remembering that amiodarone levels take several weeks to stabilize.  []  Antiepileptics: Amiodarone can increase plasma concentration of phenytoin, the dose should be reduced. Note that small changes in phenytoin dose can result in large changes in levels. Monitor patient and counsel on signs of toxicity.  []  Beta blockers: increased risk of bradycardia, AV block and myocardial depression. Sotalol - avoid concomitant use.  []   Calcium channel blockers (diltiazem and verapamil): increased risk of bradycardia, AV block and myocardial depression.  []   Cyclosporine: Amiodarone increases levels of cyclosporine. Reduced dose of cyclosporine is recommended.  []  Digoxin dose should be halved when amiodarone is started.  [x]  Diuretics: increased risk of cardiotoxicity if hypokalemia occurs.  []  Oral hypoglycemic agents (glyburide, glipizide, glimepiride): increased risk of hypoglycemia. Patient's glucose levels should be monitored closely when initiating amiodarone therapy.   []  Drugs that prolong the QT interval:  Torsades de pointes risk may be  increased with concurrent use - avoid if possible.  Monitor QTc, also keep magnesium/potassium WNL if concurrent therapy can't be avoided. [name] Antibiotics: e.g. fluoroquinolones, erythromycin. . Antiarrhythmics: e.g. quinidine, procainamide, disopyramide, sotalol. . Antipsychotics: e.g. phenothiazines, haloperidol.  . Lithium, tricyclic antidepressants, and methadone.  Thank You,  Antonietta Jewel, PharmD, [place] Pharmacist  Phone: [phone]  Please check AMION for all [place] phone numbers After 10:00 PM, call [place] [phone] 08/05/2019 9:07 PM

## 2019-08-06 ENCOUNTER — Inpatient Hospital Stay (HOSPITAL_COMMUNITY): Payer: PPO

## 2019-08-06 LAB — BASIC METABOLIC PANEL
Anion gap: 8 (ref 5–15)
BUN: 11 mg/dL (ref 8–23)
CO2: 27 mmol/L (ref 22–32)
Calcium: 8 mg/dL — ABNORMAL LOW (ref 8.9–10.3)
Chloride: 102 mmol/L (ref 98–111)
Creatinine, Ser: 0.91 mg/dL (ref 0.44–1.00)
GFR calc Af Amer: >60 mL/min (ref >60)
GFR calc non Af Amer: >60 mL/min (ref >60)
Glucose, Bld: 97 mg/dL (ref 70–99)
Potassium: 3.9 mmol/L (ref 3.5–5.1)
Sodium: 137 mmol/L (ref 135–145)

## 2019-08-06 LAB — MAGNESIUM: Magnesium: 1.9 mg/dL (ref 1.7–2.4)

## 2019-08-06 LAB — CBC
HCT: 30.7 % — ABNORMAL LOW (ref 36.0–46.0)
Hemoglobin: 9.7 g/dL — ABNORMAL LOW (ref 12.0–15.0)
MCH: 29.6 pg (ref 26.0–34.0)
MCHC: 31.6 g/dL (ref 30.0–36.0)
MCV: 93.6 fL (ref 80.0–100.0)
Platelets: 172 10*3/uL (ref 150–400)
RBC: 3.28 MIL/uL — ABNORMAL LOW (ref 3.87–5.11)
RDW: 12.8 % (ref 11.5–15.5)
WBC: 8.1 10*3/uL (ref 4.0–10.5)
nRBC: 0 % (ref 0.0–0.2)

## 2019-08-06 LAB — PROTIME-INR
INR: 1.2 (ref 0.8–1.2)
Prothrombin Time: 14.7 seconds (ref 11.4–15.2)

## 2019-08-06 LAB — GLUCOSE, CAPILLARY
Glucose-Capillary: 97 mg/dL (ref 70–99)
Glucose-Capillary: 95 mg/dL (ref 70–99)

## 2019-08-06 MED ORDER — ONDANSETRON HCL 4 MG/2ML IJ SOLN: 4.0000 mg | Freq: Four times a day (QID) | INTRAMUSCULAR | Status: DC | PRN

## 2019-08-06 MED ORDER — INSULIN ASPART 100 UNIT/ML ~~LOC~~ SOLN: 0.0000 [IU] | Freq: Three times a day (TID) | SUBCUTANEOUS | Status: DC

## 2019-08-06 MED ORDER — SODIUM CHLORIDE 0.9 % IV SOLN: 250.0000 mL | INTRAVENOUS | Status: DC | PRN

## 2019-08-06 MED ORDER — METOPROLOL TARTRATE 12.5 MG HALF TABLET
12.5000 mg | ORAL_TABLET | Freq: Two times a day (BID) | ORAL | Status: DC
  Administered 2019-08-06 – 2019-08-08 (×5): 12.5 mg via ORAL
  Filled 2019-08-06 (×5): qty 1

## 2019-08-06 MED ORDER — SODIUM CHLORIDE 0.9% FLUSH
3.0000 mL | Freq: Two times a day (BID) | INTRAVENOUS | Status: DC
  Administered 2019-08-07 – 2019-08-08 (×3): 3 mL via INTRAVENOUS

## 2019-08-06 MED ORDER — BISACODYL 10 MG RE SUPP: 10.0000 mg | Freq: Every day | RECTAL | Status: DC | PRN

## 2019-08-06 MED ORDER — BISACODYL 5 MG PO TBEC: 10.0000 mg | DELAYED_RELEASE_TABLET | Freq: Every day | ORAL | Status: DC | PRN

## 2019-08-06 MED ORDER — ACETAMINOPHEN 325 MG PO TABS
650.0000 mg | ORAL_TABLET | Freq: Four times a day (QID) | ORAL | Status: DC | PRN
  Administered 2019-08-07: 650 mg via ORAL
  Filled 2019-08-06: qty 2

## 2019-08-06 MED ORDER — ASPIRIN EC 81 MG PO TBEC
81.0000 mg | DELAYED_RELEASE_TABLET | Freq: Every day | ORAL | Status: DC
  Administered 2019-08-07 – 2019-08-08 (×2): 81 mg via ORAL
  Filled 2019-08-06 (×2): qty 1

## 2019-08-06 MED ORDER — ALUM & MAG HYDROXIDE-SIMETH 200-200-20 MG/5ML PO SUSP: 15.0000 mL | ORAL | Status: DC | PRN

## 2019-08-06 MED ORDER — TRAMADOL HCL 50 MG PO TABS
50.0000 mg | ORAL_TABLET | ORAL | Status: DC | PRN
  Administered 2019-08-06 – 2019-08-08 (×10): 100 mg via ORAL
  Filled 2019-08-06 (×10): qty 2

## 2019-08-06 MED ORDER — SODIUM CHLORIDE 0.9% FLUSH: 3.0000 mL | INTRAVENOUS | Status: DC | PRN

## 2019-08-06 MED ORDER — ONDANSETRON HCL 4 MG PO TABS: 4.0000 mg | ORAL_TABLET | Freq: Four times a day (QID) | ORAL | Status: DC | PRN

## 2019-08-06 MED ORDER — ~~LOC~~ CARDIAC SURGERY, PATIENT & FAMILY EDUCATION: Freq: Once | Status: AC

## 2019-08-06 NOTE — Progress Notes (Signed)
Patient ID: Anne Hogan, female   DOB: 1946-06-07, 74 y.o.   MRN: [id number] TCTS DAILY ICU PROGRESS NOTE                   [address]            [place] [address]          [phone]   3 Days Post-Op Procedure(s) (LRB): MINIMALLY INVASIVE MITRAL VALVE REPAIR (MVR) using Memo 4D 43 MM Mitral Valve. (Right) TRANSESOPHAGEAL ECHOCARDIOGRAM (TEE) (N/A) CLOSURE OF PATENT FORAMEN OVALE (N/A)  Total Length of Stay:  LOS: 3 days   Subjective: Patient feels well this morning, episode of rapid atrial fibrillation during the night, started on IV amnio and converted to sinus rhythm currently  Objective: Vital signs in last 24 hours: Temp:  [98 F (36.7 C)-99.3 F (37.4 C)] 98 F (36.7 C) (01/17 0815) Pulse Rate:  [59-122] 61 (01/17 0700) Cardiac Rhythm: Normal sinus rhythm (01/17 0400) Resp:  [12-25] 16 (01/17 0700) BP: (92-130)/(44-74) 102/55 (01/17 0700) SpO2:  [95 %-100 %] 95 % (01/17 0700) Weight:  [79.3 kg] 79.3 kg (01/17 0500)  Filed Weights   08/04/19 0500 08/05/19 0500 08/06/19 0500  Weight: 88.3 kg 83.1 kg 79.3 kg    Weight change: -3.799 kg   Hemodynamic parameters for last 24 hours:    Intake/Output from previous day: 01/16 0701 - 01/17 0700 In: 554.2 [P.O.:240; I.V.:214.2; IV Piggyback:100] Out: 3100 [Urine:2990; Chest Tube:110]  Intake/Output this shift: Total I/O In: -  Out: 20 [Chest Tube:20]  Current Meds: Scheduled Meds: . acetaminophen  1,000 mg Oral Q6H  . amiodarone  200 mg Oral Q12H   Followed by  . [START ON 08/13/2019] amiodarone  200 mg Oral Daily  . aspirin EC  81 mg Oral Daily  . bisacodyl  10 mg Oral Daily   Or  . bisacodyl  10 mg Rectal Daily  . buPROPion  300 mg Oral Daily  . Chlorhexidine Gluconate Cloth  6 each Topical Daily  . docusate sodium  200 mg Oral Daily  . enoxaparin (LOVENOX) injection  30 mg Subcutaneous QHS  . fluticasone furoate-vilanterol  1 puff Inhalation Daily  . furosemide  40 mg Oral BID  .  montelukast  10 mg Oral QHS  . pantoprazole  40 mg Oral Daily  . potassium chloride  20 mEq Oral BID WC  . sodium chloride flush  10-40 mL Intracatheter Q12H  . sodium chloride flush  3 mL Intravenous Q12H  . warfarin  2.5 mg Oral q1800  . Warfarin - Physician Dosing Inpatient   Does not apply q1800   Continuous Infusions: . sodium chloride    . amiodarone 30.06 mg/hr (08/06/19 0500)  . lactated ringers     PRN Meds:.metoprolol tartrate, morphine injection, ondansetron (ZOFRAN) IV, oxyCODONE, sodium chloride flush, sodium chloride flush, traMADol  General appearance: alert, cooperative and no distress Neurologic: intact Heart: regular rate and rhythm, S1, S2 normal, no murmur, click, rub or gallop Lungs: clear to auscultation bilaterally Abdomen: soft, non-tender; bowel sounds normal; no masses,  no organomegaly Extremities: extremities normal, atraumatic, no cyanosis or edema and Homans sign is negative, no sign of DVT Wound: Incisions intact chest tubes in place  Lab Results: CBC: Recent Labs    08/05/19 0400 08/05/19 0400 08/05/19 2035 08/06/19 0432  WBC 8.4  --   --  8.1  HGB 9.6*   < > 9.9* 9.7*  HCT 29.6*   < >  29.0* 30.7*  PLT 148*  --   --  172   < > = values in this interval not displayed.   BMET:  Recent Labs    08/05/19 0400 08/05/19 0400 08/05/19 2035 08/06/19 0432  NA 134*   < > 141 137  K 3.7   < > 3.9 3.9  CL 102  --   --  102  CO2 27  --   --  27  GLUCOSE 102*  --   --  97  BUN 14  --   --  11  CREATININE 0.96  --   --  0.91  CALCIUM 7.5*  --   --  8.0*   < > = values in this interval not displayed.    CMET: Lab Results  Component Value Date   WBC 8.1 08/06/2019   HGB 9.7 (L) 08/06/2019   HCT 30.7 (L) 08/06/2019   PLT 172 08/06/2019   GLUCOSE 97 08/06/2019   ALT 16 07/24/2019   AST 22 07/24/2019   NA 137 08/06/2019   K 3.9 08/06/2019   CL 102 08/06/2019   CREATININE 0.91 08/06/2019   BUN 11 08/06/2019   CO2 27 08/06/2019   TSH  2.265 08/05/2019   INR 1.2 08/06/2019   HGBA1C 5.2 07/24/2019      PT/INR:  Recent Labs    08/06/19 0432  LABPROT 14.7  INR 1.2   Radiology: [facility] Chest Port 1 View In am  Result Date: 08/06/2019 CLINICAL DATA:  Status post mitral valve repair. EXAM: PORTABLE CHEST 1 VIEW COMPARISON:  August 05, 2019. FINDINGS: Stable cardiomediastinal silhouette. Stable position of right-sided chest tubes without pneumothorax. Left lung is clear. Mild right basilar atelectasis is noted. Left internal jugular venous sheath is unchanged. Bony thorax is unremarkable. IMPRESSION: Stable position of right-sided chest tubes without pneumothorax. Mild right basilar subsegmental atelectasis. Electronically Signed   By: Marijo Conception M.D.   On: 08/06/2019 09:14     Assessment/Plan: S/P Procedure(s) (LRB): MINIMALLY INVASIVE MITRAL VALVE REPAIR (MVR) using Memo 4D 38 MM Mitral Valve. (Right) TRANSESOPHAGEAL ECHOCARDIOGRAM (TEE) (N/A) CLOSURE OF PATENT FORAMEN OVALE (N/A) Mobilize Diuresis d/c tubes/lines Chest tube drainage less than 100 mL past 12 hours-DC right chest tubes DC central line Continue Coumadin and now with p.o. amnio for episodic atrial fibrillation    Grace Isaac 08/06/2019 9:26 AM

## 2019-08-06 NOTE — Progress Notes (Signed)
Patient was being transferred back to the bed from the chair. Patient's heart rate went into Afib 130s. Called Dr. Servando Snare and he stated to give pm dose of Amiodarone and Lopressor at this time. He did not stated to re order them for later. Patient is currently resting in the bed in Afib 115. Gave medications. Will continue to monitor.

## 2019-08-06 NOTE — Progress Notes (Signed)
Pt converted to NSR at approximately @2300  will continue to monitor

## 2019-08-07 ENCOUNTER — Inpatient Hospital Stay (HOSPITAL_COMMUNITY): Payer: PPO

## 2019-08-07 LAB — CBC
HCT: 28.9 % — ABNORMAL LOW (ref 36.0–46.0)
Hemoglobin: 9.6 g/dL — ABNORMAL LOW (ref 12.0–15.0)
MCH: 29.7 pg (ref 26.0–34.0)
MCHC: 33.2 g/dL (ref 30.0–36.0)
MCV: 89.5 fL (ref 80.0–100.0)
Platelets: 192 10*3/uL (ref 150–400)
RBC: 3.23 MIL/uL — ABNORMAL LOW (ref 3.87–5.11)
RDW: 12.7 % (ref 11.5–15.5)
WBC: 7.5 10*3/uL (ref 4.0–10.5)
nRBC: 0 % (ref 0.0–0.2)

## 2019-08-07 LAB — BASIC METABOLIC PANEL
Anion gap: 9 (ref 5–15)
BUN: 9 mg/dL (ref 8–23)
CO2: 27 mmol/L (ref 22–32)
Calcium: 8.4 mg/dL — ABNORMAL LOW (ref 8.9–10.3)
Chloride: 101 mmol/L (ref 98–111)
Creatinine, Ser: 0.99 mg/dL (ref 0.44–1.00)
GFR calc Af Amer: >60 mL/min (ref >60)
GFR calc non Af Amer: 57 mL/min — ABNORMAL LOW (ref >60)
Glucose, Bld: 96 mg/dL (ref 70–99)
Potassium: 3.7 mmol/L (ref 3.5–5.1)
Sodium: 137 mmol/L (ref 135–145)

## 2019-08-07 LAB — GLUCOSE, CAPILLARY: Glucose-Capillary: 101 mg/dL — ABNORMAL HIGH (ref 70–99)

## 2019-08-07 LAB — PROTIME-INR
INR: 1.1 (ref 0.8–1.2)
Prothrombin Time: 14 seconds (ref 11.4–15.2)

## 2019-08-07 MED ORDER — FUROSEMIDE 40 MG PO TABS
40.0000 mg | ORAL_TABLET | Freq: Every day | ORAL | Status: DC
  Administered 2019-08-07 – 2019-08-08 (×2): 40 mg via ORAL
  Filled 2019-08-07 (×2): qty 1

## 2019-08-07 MED ORDER — WARFARIN SODIUM 5 MG PO TABS
5.0000 mg | ORAL_TABLET | Freq: Every day | ORAL | Status: DC
  Administered 2019-08-07: 5 mg via ORAL
  Filled 2019-08-07: qty 1

## 2019-08-07 MED ORDER — POTASSIUM CHLORIDE CRYS ER 20 MEQ PO TBCR
40.0000 meq | EXTENDED_RELEASE_TABLET | Freq: Once | ORAL | Status: AC
  Administered 2019-08-07: 10:00:00 40 meq via ORAL
  Filled 2019-08-07: qty 2

## 2019-08-07 NOTE — Progress Notes (Signed)
Patient given patient education documents on Coumadin, heart healthy diet  And coumadin and vitamin K foods. Will monitor patient Anne Hogan, Bettina Gavia RN

## 2019-08-07 NOTE — Progress Notes (Addendum)
      [address]       [place] [address]             [phone]        4 Days Post-Op Procedure(s) (LRB): MINIMALLY INVASIVE MITRAL VALVE REPAIR (MVR) using Memo 4D 2 MM Mitral Valve. (Right) TRANSESOPHAGEAL ECHOCARDIOGRAM (TEE) (N/A) CLOSURE OF PATENT FORAMEN OVALE (N/A)  Subjective: Patient with incisional pain this am.  Objective: Vital signs in last 24 hours: Temp:  [98 F (36.7 C)-99.1 F (37.3 C)] 98.8 F (37.1 C) (01/18 0447) Pulse Rate:  [59-67] 64 (01/18 0447) Cardiac Rhythm: Normal sinus rhythm (01/18 0447) Resp:  [14-23] 16 (01/18 0447) BP: (92-101)/(57-62) 101/61 (01/18 0447) SpO2:  [95 %-98 %] 95 % (01/18 0447) Weight:  [77.3 kg] 77.3 kg (01/18 0447)  Pre op weight 77.1 kg Current Weight  08/07/19 77.3 kg      Intake/Output from previous day: 01/17 0701 - 01/18 0700 In: 325 [P.O.:240; I.V.:85] Out: 20 [Chest Tube:20]   Physical Exam:  Cardiovascular: RRR, no murmur Pulmonary: Clear to auscultation bilaterally Abdomen: Soft, non tender, bowel sounds present. Extremities: Trace bilateral lower extremity edema. Wound: Clean and dry.  No erythema or signs of infection.  Lab Results: CBC: Recent Labs    08/06/19 0432 08/07/19 0229  WBC 8.1 7.5  HGB 9.7* 9.6*  HCT 30.7* 28.9*  PLT 172 192   BMET:  Recent Labs    08/06/19 0432 08/07/19 0229  NA 137 137  K 3.9 3.7  CL 102 101  CO2 27 27  GLUCOSE 97 96  BUN 11 9  CREATININE 0.91 0.99  CALCIUM 8.0* 8.4*    PT/INR:  Lab Results  Component Value Date   INR 1.1 08/07/2019   INR 1.2 08/06/2019   INR 1.2 08/05/2019   ABG:  INR: Will add last result for INR, ABG once components are confirmed Will add last 4 CBG results once components are confirmed  Assessment/Plan:  1. CV - Episodes of a fib. SR with HR in the 60's this am. On Lopressor 12.5 mg bid, Amiodarone 200 mg bid, and Coumadin. INR this am 1.1. She has been given 3 doses of 2.5 mg with no increase in  INR so will increase to 5 mg tonight. 2.  Pulmonary - On room air. Chest tubes removed yesterday. CXR this am appears stable. Encourage incentive spirometer. 3. Volume Overload - On Lasix 40 mg bid. Will decreased to daily. 4.  Acute blood loss anemia - H and H this am 9.6 and 28.9 5. Supplement potassium 6. CBGs 97/95/101. Pre op HGA1C 5.2. Stop accu checks and SS PRN 7. Remove EPW 8. Will discuss disposition with Dr. Leeroy Cha M ZimmermanPA-C 08/07/2019,7:22 AM   I have seen and examined the patient and agree with the assessment and plan as outlined.  Looks good and maintaining NSR last 24 hours.  Tentatively plan d/c home tomorrow.  Rexene Alberts, MD 08/07/2019 9:01 AM

## 2019-08-07 NOTE — Progress Notes (Signed)
Patient EPW pulled per protocol and as ordered all ends intact. Patient reminded to lie supine approximately one hour. bp 103/57 heart rate 65 on monitor. Patient tolerated well. Will monitor patient. Chazz Philson, Bettina Gavia RN

## 2019-08-07 NOTE — Discharge Instructions (Addendum)
Discharge Instructions:  1. You may shower, please wash incisions daily with soap and water and keep dry.  If you wish to cover wounds with dressing you may do so but please keep clean and change daily.  No tub baths or swimming until incisions have completely healed.  If your incisions become red or develop any drainage please call our office at [phone]  2. No Driving until cleared by Dr. Guy Sandifer office and you are no longer using narcotic pain medications  3. Monitor your weight daily.. Please use the same scale and weigh at same time... If you gain 5-10 lbs in 48 hours with associated lower extremity swelling, please contact our office at [phone]  4. Fever of 101.5 for at least 24 hours with no source, please contact our office at [phone]  5. Activity- up as tolerated, please walk at least 3 times per day.  Avoid strenuous activity, no lifting, pushing, or pulling with your arms over 8-10 lbs for a minimum of 2 weeks  6. If any questions or concerns arise, please do not hesitate to contact our office at [phone] Information on my medicine - Coumadin   (Warfarin)  This medication education was reviewed with me or my healthcare representative as part of my discharge preparation.  The pharmacist that spoke with me during my hospital stay was:  Yujing Z  Steenwyk,, RPH  Why was Coumadin prescribed for you? Coumadin was prescribed for you because you have a blood clot or a medical condition that can cause an increased risk of forming blood clots. Blood clots can cause serious health problems by blocking the flow of blood to the heart, lung, or brain. Coumadin can prevent harmful blood clots from forming. As a reminder your indication for Coumadin is:   Blood Clot Prevention After Heart Valve Surgery  What test will check on my response to Coumadin? While on Coumadin (warfarin) you will need to have an INR test regularly to ensure that your dose is keeping you in the desired  range. The INR (international normalized ratio) number is calculated from the result of the laboratory test called prothrombin time (PT).  If an INR APPOINTMENT HAS NOT ALREADY BEEN MADE FOR YOU please schedule an appointment to have this lab work done by your health care provider within 7 days. Your INR goal is usually a number between:  2 to 3 or your provider may give you a more narrow range like 2-2.5.  Ask your health care provider during an office visit what your goal INR is.  What  do you need to  know  About  COUMADIN? Take Coumadin (warfarin) exactly as prescribed by your healthcare provider about the same time each day.  DO NOT stop taking without talking to the doctor who prescribed the medication.  Stopping without other blood clot prevention medication to take the place of Coumadin may increase your risk of developing a new clot or stroke.  Get refills before you run out.  What do you do if you miss a dose? If you miss a dose, take it as soon as you remember on the same day then continue your regularly scheduled regimen the next day.  Do not take two doses of Coumadin at the same time.  Important Safety Information A possible side effect of Coumadin (Warfarin) is an increased risk of bleeding. You should call your healthcare provider right away if you experience any of the following: ? Bleeding from an injury or your nose that does  not stop. ? Unusual colored urine (red or dark brown) or unusual colored stools (red or black). ? Unusual bruising for unknown reasons. ? A serious fall or if you hit your head (even if there is no bleeding).  Some foods or medicines interact with Coumadin (warfarin) and might alter your response to warfarin. To help avoid this: ? Eat a balanced diet, maintaining a consistent amount of Vitamin K. ? Notify your provider about major diet changes you plan to make. ? Avoid alcohol or limit your intake to 1 drink for women and 2 drinks for men per day. (1  drink is 5 oz. wine, 12 oz. beer, or 1.5 oz. liquor.)  Make sure that ANY health care provider who prescribes medication for you knows that you are taking Coumadin (warfarin).  Also make sure the healthcare provider who is monitoring your Coumadin knows when you have started a new medication including herbals and non-prescription products.  Coumadin (Warfarin)  Major Drug Interactions  Increased Warfarin Effect Decreased Warfarin Effect  Alcohol (large quantities) Antibiotics (esp. Septra/Bactrim, Flagyl, Cipro) Amiodarone (Cordarone) Aspirin (ASA) Cimetidine (Tagamet) Megestrol (Megace) NSAIDs (ibuprofen, naproxen, etc.) Piroxicam (Feldene) Propafenone (Rythmol SR) Propranolol (Inderal) Isoniazid (INH) Posaconazole (Noxafil) Barbiturates (Phenobarbital) Carbamazepine (Tegretol) Chlordiazepoxide (Librium) Cholestyramine (Questran) Griseofulvin Oral Contraceptives Rifampin Sucralfate (Carafate) Vitamin K   Coumadin (Warfarin) Major Herbal Interactions  Increased Warfarin Effect Decreased Warfarin Effect  Garlic Ginseng Ginkgo biloba Coenzyme Q10 Green tea St. John's wort    Coumadin (Warfarin) FOOD Interactions  Eat a consistent number of servings per week of foods HIGH in Vitamin K (1 serving =  cup)  Collards (cooked, or boiled & drained) Kale (cooked, or boiled & drained) Mustard greens (cooked, or boiled & drained) Parsley *serving size only =  cup Spinach (cooked, or boiled & drained) Swiss chard (cooked, or boiled & drained) Turnip greens (cooked, or boiled & drained)  Eat a consistent number of servings per week of foods MEDIUM-HIGH in Vitamin K (1 serving = 1 cup)  Asparagus (cooked, or boiled & drained) Broccoli (cooked, boiled & drained, or raw & chopped) Brussel sprouts (cooked, or boiled & drained) *serving size only =  cup Lettuce, raw (green leaf, endive, romaine) Spinach, raw Turnip greens, raw & chopped   These websites have more information  on Coumadin (warfarin):  FailFactory.se; VeganReport.com.au;

## 2019-08-07 NOTE — Progress Notes (Addendum)
Patient unable to watch coumadin video at this time, due to the patient education video network being down. Nelda Bucks, Bettina Gavia RN

## 2019-08-07 NOTE — Progress Notes (Signed)
Transitions of Care Pharmacist Note  Anne Hogan is a 74 y.o. female that has been diagnosed with mitral valve repair and will be prescribed Coumadin (warfarin)  at discharge.   Patient Education: I provided the following education on 08/07/2019 to the patient: How to take the medication Described what the medication is Signs of bleeding Signs/symptoms of VTE and stroke  Answered their questions Dietary and medication precautions with warfarin   Discharge Medications Plan: The patient wants to have their discharge medications filled by the Transitions of Care pharmacy rather than their usual pharmacy.  The discharge orders pharmacy has been changed to the Transitions of Care pharmacy, the patient will receive a phone call regarding co-pay, and their medications will be delivered by the Transitions of Care pharmacy.    Thank you,   Berenice Bouton, PharmD PGY1 Pharmacy Resident  Please check AMION for all [facility] phone numbers After 10:00 PM, call [facility] [phone]  August 07, 2019

## 2019-08-07 NOTE — Progress Notes (Signed)
CARDIAC REHAB PHASE I   PRE:  Rate/Rhythm: 66 SR    SaO2: 94 %RA  MODE:  Ambulation: 470 ft   POST:  Rate/Rhythm: 85 SR  BP:  Supine:   Sitting: 113/57  Standing:    SaO2: 95%RA 1100-1130 Pt walked 470 ft on RA with hand held asst and then to bathroom. Slightly wobbly but tolerated well. Encouraged IS use. Pt can get to 1750 ml.   Graylon Good, RN BSN  08/07/2019 11:27 AM

## 2019-08-07 NOTE — Progress Notes (Signed)
Pt ambulated x 470 around unit independently, with only minor gait disturbances, pt tolerated well

## 2019-08-08 LAB — PROTIME-INR
INR: 1.1 (ref 0.8–1.2)
Prothrombin Time: 14.5 seconds (ref 11.4–15.2)

## 2019-08-08 LAB — BASIC METABOLIC PANEL
Anion gap: 8 (ref 5–15)
BUN: 10 mg/dL (ref 8–23)
CO2: 27 mmol/L (ref 22–32)
Calcium: 8.7 mg/dL — ABNORMAL LOW (ref 8.9–10.3)
Chloride: 101 mmol/L (ref 98–111)
Creatinine, Ser: 0.99 mg/dL (ref 0.44–1.00)
GFR calc Af Amer: >60 mL/min (ref >60)
GFR calc non Af Amer: 57 mL/min — ABNORMAL LOW (ref >60)
Glucose, Bld: 93 mg/dL (ref 70–99)
Potassium: 4.4 mmol/L (ref 3.5–5.1)
Sodium: 136 mmol/L (ref 135–145)

## 2019-08-08 MED ORDER — TRAMADOL HCL 50 MG PO TABS: 50.0000 mg | ORAL_TABLET | ORAL | 0 refills | Status: DC | PRN

## 2019-08-08 MED ORDER — POTASSIUM CHLORIDE CRYS ER 10 MEQ PO TBCR: 10.0000 meq | EXTENDED_RELEASE_TABLET | Freq: Every day | ORAL | 0 refills | Status: DC

## 2019-08-08 MED ORDER — AMIODARONE HCL 200 MG PO TABS: 200.0000 mg | ORAL_TABLET | Freq: Every day | ORAL | 1 refills | Status: DC

## 2019-08-08 MED ORDER — FUROSEMIDE 40 MG PO TABS: 40.0000 mg | ORAL_TABLET | Freq: Every day | ORAL | 0 refills | Status: DC

## 2019-08-08 MED ORDER — POTASSIUM CHLORIDE CRYS ER 20 MEQ PO TBCR
20.0000 meq | EXTENDED_RELEASE_TABLET | Freq: Every day | ORAL | Status: DC
  Administered 2019-08-08: 20 meq via ORAL
  Filled 2019-08-08: qty 1

## 2019-08-08 MED ORDER — ASPIRIN 81 MG PO TBEC: 81.0000 mg | DELAYED_RELEASE_TABLET | Freq: Every day | ORAL | Status: DC

## 2019-08-08 MED ORDER — METOPROLOL TARTRATE 25 MG PO TABS: 12.5000 mg | ORAL_TABLET | Freq: Two times a day (BID) | ORAL | 1 refills | Status: DC

## 2019-08-08 MED ORDER — WARFARIN SODIUM 5 MG PO TABS: 5.0000 mg | ORAL_TABLET | Freq: Every day | ORAL | 1 refills | Status: DC

## 2019-08-08 NOTE — Care Management Important Message (Signed)
Important Message  Patient Details  Name: Anne Hogan MRN: [id number] Date of Birth: 1946/05/11   Medicare Important Message Given:  Yes     Shelda Altes 08/08/2019, 12:58 PM

## 2019-08-08 NOTE — Progress Notes (Signed)
[id number] Education completed with pt who voiced understanding. Reviewed wound care, restrictions, IS, gave heart healthy diet, ( pt has Coumadin info), and walking instructions. Discussed CRP 2 and referred to [place]. Pt is interested in participating in Virtual Cardiac and Pulmonary Rehab. Pt advised that Virtual Cardiac and Pulmonary Rehab is provided at no cost to the patient.  Pt interested in Virtual and telephone.  Checklist:  1. Pt has smart device  ie smartphone and/or ipad for downloading an app  Yes 2. Reliable internet/wifi service    Yes 3. Understands how to use their smartphone and navigate within an app.  Yes   Pt verbalized understanding and is in agreement.

## 2019-08-08 NOTE — Progress Notes (Addendum)
      [address]       [place] [address]             [phone]        5 Days Post-Op Procedure(s) (LRB): MINIMALLY INVASIVE MITRAL VALVE REPAIR (MVR) using Memo 4D 38 MM Mitral Valve. (Right) TRANSESOPHAGEAL ECHOCARDIOGRAM (TEE) (N/A) CLOSURE OF PATENT FORAMEN OVALE (N/A)  Subjective: Patient a little worried about going home but we had a good discussion and she seems a little less anxious.  Objective: Vital signs in last 24 hours: Temp:  [98.2 F (36.8 C)-98.7 F (37.1 C)] 98.4 F (36.9 C) (01/19 0604) Pulse Rate:  [58-66] 63 (01/19 0604) Cardiac Rhythm: Normal sinus rhythm (01/19 0340) Resp:  [14-20] 14 (01/19 0604) BP: (90-113)/(53-81) 107/56 (01/19 0604) SpO2:  [95 %-99 %] 95 % (01/19 0604) Weight:  [77.3 kg] 77.3 kg (01/19 0310)  Pre op weight 77.1 kg Current Weight  08/08/19 77.3 kg      Intake/Output from previous day: 01/18 0701 - 01/19 0700 In: 240 [P.O.:240] Out: -    Physical Exam:  Cardiovascular: RRR, no murmur Pulmonary: Clear to auscultation bilaterally Abdomen: Soft, non tender, bowel sounds present. Extremities: Trace bilateral lower extremity edema. Wound: Clean and dry.  No erythema or signs of infection.  Lab Results: CBC: Recent Labs    08/06/19 0432 08/07/19 0229  WBC 8.1 7.5  HGB 9.7* 9.6*  HCT 30.7* 28.9*  PLT 172 192   BMET:  Recent Labs    08/07/19 0229 08/08/19 0251  NA 137 136  K 3.7 4.4  CL 101 101  CO2 27 27  GLUCOSE 96 93  BUN 9 10  CREATININE 0.99 0.99  CALCIUM 8.4* 8.7*    PT/INR:  Lab Results  Component Value Date   INR 1.1 08/08/2019   INR 1.1 08/07/2019   INR 1.2 08/06/2019   ABG:  INR: Will add last result for INR, ABG once components are confirmed Will add last 4 CBG results once components are confirmed  Assessment/Plan:  1. CV - Episodes of a fib. SR with HR in the 60's this am. On Lopressor 12.5 mg bid, Amiodarone 200 mg bid, and Coumadin. INR this am remains 1.1.  Will continue with 5 mg of Coumadin as have not seen result of first dose given last evening. 2.  Pulmonary - On room air.  Encourage incentive spirometer. 3. Volume Overload - On Lasix 40 mg daily. 4.  Acute blood loss anemia - Last H and H 9.6 and 28.9 5. Possible discharge   Donielle M ZimmermanPA-C 08/08/2019,7:10 AM    I have seen and examined the patient and agree with the assessment and plan as outlined.  D/C home today.  Instructions given.  Rexene Alberts, MD 08/08/2019 8:28 AM

## 2019-08-08 NOTE — Progress Notes (Signed)
D/c education completed with pt. IV and tele removed. Pt getting dressed and calling for ride. Will continue to monitor.

## 2019-08-09 ENCOUNTER — Encounter (HOSPITAL_COMMUNITY): Payer: Self-pay | Admitting: *Deleted

## 2019-08-09 NOTE — Progress Notes (Signed)
Referral received and verified for MD signature.  Follow up appt is 08/22/19 with Cardiology and 08/28/19 CV Surgeon.  Due to the senior leadership directed instructions to pause onsite cardiac rehab in response to the surge of Covid + patients within the health system staff were deployed to support health system and community needs.  Will wait to check pt insurance benefits and eligibility once onsite cardiac rehab is permitted to reopen. Pt will be contacted for scheduling Virtual Platform  which -Cardiac Rehab upon satisfactorily completing their follow up appt on 08/22/19 with Cardiology to start after the completion of CV surgical appt on 08/28/19.  Cherre Huger, BSN Cardiac and Training and development officer

## 2019-08-10 ENCOUNTER — Ambulatory Visit (INDEPENDENT_AMBULATORY_CARE_PROVIDER_SITE_OTHER): Payer: PPO | Admitting: *Deleted

## 2019-08-10 ENCOUNTER — Other Ambulatory Visit: Payer: Self-pay

## 2019-08-10 DIAGNOSIS — Z5181 Encounter for therapeutic drug level monitoring: Secondary | ICD-10-CM | POA: Diagnosis not present

## 2019-08-10 DIAGNOSIS — I341 Nonrheumatic mitral (valve) prolapse: Secondary | ICD-10-CM

## 2019-08-10 DIAGNOSIS — Z9889 Other specified postprocedural states: Secondary | ICD-10-CM

## 2019-08-10 LAB — POCT INR: INR: 1.5 — AB (ref 2.0–3.0)

## 2019-08-10 MED FILL — Potassium Chloride Inj 2 mEq/ML: INTRAVENOUS | Qty: 40 | Status: AC

## 2019-08-10 MED FILL — Lidocaine HCl Local Preservative Free (PF) Inj 2%: INTRAMUSCULAR | Qty: 15 | Status: AC

## 2019-08-10 MED FILL — Heparin Sodium (Porcine) Inj 1000 Unit/ML: INTRAMUSCULAR | Qty: 30 | Status: AC

## 2019-08-10 NOTE — Patient Instructions (Addendum)
A full discussion of the nature of anticoagulants has been carried out.  A benefit risk analysis has been presented to the patient, so that they understand the justification for choosing anticoagulation at this time. The need for frequent and regular monitoring, precise dosage adjustment and compliance is stressed.  Side effects of potential bleeding are discussed.  The patient should avoid any OTC items containing aspirin or ibuprofen, and should avoid great swings in general diet.  Avoid alcohol consumption.  Call if any signs of abnormal bleeding. Description    Take 1.5 tablets today and then continue to take 1 tablet daily. Recheck INR in 1 week.  [facility] [phone] Main [phone]

## 2019-08-11 ENCOUNTER — Encounter (HOSPITAL_COMMUNITY): Payer: Self-pay | Admitting: Radiology

## 2019-08-14 ENCOUNTER — Ambulatory Visit: Payer: PPO

## 2019-08-16 ENCOUNTER — Other Ambulatory Visit: Payer: Self-pay

## 2019-08-16 ENCOUNTER — Ambulatory Visit (INDEPENDENT_AMBULATORY_CARE_PROVIDER_SITE_OTHER): Payer: PPO | Admitting: *Deleted

## 2019-08-16 DIAGNOSIS — Z9889 Other specified postprocedural states: Secondary | ICD-10-CM

## 2019-08-16 DIAGNOSIS — I341 Nonrheumatic mitral (valve) prolapse: Secondary | ICD-10-CM

## 2019-08-16 DIAGNOSIS — Z5181 Encounter for therapeutic drug level monitoring: Secondary | ICD-10-CM

## 2019-08-16 LAB — POCT INR: INR: 2 (ref 2.0–3.0)

## 2019-08-16 NOTE — Patient Instructions (Signed)
Description   Take 1.5 tablets today then start taking 1 tablet daily except 1.5 tablets on Wednesdays. Recheck INR in 1 week.  [facility] [phone] Main [phone]

## 2019-08-21 ENCOUNTER — Ambulatory Visit (INDEPENDENT_AMBULATORY_CARE_PROVIDER_SITE_OTHER): Payer: Self-pay

## 2019-08-21 ENCOUNTER — Other Ambulatory Visit: Payer: Self-pay

## 2019-08-21 VITALS — Temp 96.8°F

## 2019-08-21 DIAGNOSIS — Z9889 Other specified postprocedural states: Secondary | ICD-10-CM

## 2019-08-21 DIAGNOSIS — Z4802 Encounter for removal of sutures: Secondary | ICD-10-CM

## 2019-08-21 NOTE — Progress Notes (Signed)
Pt comes in for suture removal s/p minimally invasive mitral valve repair on 08/03/19 . She is a/o x 4 and w/o complaint.  Two incision sites to R chest (below R breast) w/ sutures c/d/i, well approximated and no s/s of infection noted. Removed sutures per standard protocol w/o difficulty. Instructed pt to keep sites clean (w/ mild soap and water) and dry. To notify office of any s/s of infection or other problems.

## 2019-08-21 NOTE — Progress Notes (Signed)
PCP:  [name], MD Primary Cardiologist: Will Meredith Leeds, MD Electrophysiologist: Dr. Karrie Meres is a 74 y.o. 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 They are seen for Dr. Curt Bears.   Since last being seen in our clinic, the patient reports doing very well.  She underwent minimally invasive MV repair last month and is recovering well. Course complicated by post op AF. She remains on low dose amiodarone.  She had an incidental finding of a lesion on her lung, and work up is ongoing for possible malignancy. She has had no further palpitations since her surgery. She can do her ADLs and just about as much as she wants without DOE.  Denies chest pain.   The patient feels that she is tolerating medications without difficulties and is otherwise without complaint today.   Cardiac cath 05/26/2019 (Prior to MV repair)  No angiographically apparent coronary artery disease.  The left ventricular ejection fraction is greater than 65% by visual estimate.  The left ventricular systolic function is normal.  LV end diastolic pressure is normal.  There is severe (4+) mitral regurgitation.  There is no aortic valve stenosis.  Hemodynamic findings consistent with mild pulmonary hypertension.  Ao sat 99%, PA sat 75%, mean PA pressure 26 mm Hg; mean PCWP 13 mm Hg;  Aortogram showed no AAA and no renal artery stenosis. Widely patent iliac arteries bilaterally.  Past Medical History:  Diagnosis Date  . ADD (attention deficit disorder)   . Arthritis    "right knee" (04/19/2013)  . Asthma 04/03/2019  . CHF (congestive heart failure) ([place])   . Chronic diastolic congestive heart failure ([place])   . Claustrophobia   . Depression   . Dyspnea   . Dysrhythmia   . Fracture of right tibial plateau 05/2016  . GERD (gastroesophageal reflux disease)   . Heart murmur   . Incidental  pulmonary nodule, greater than or equal to 14mm 06/06/2019   Needs f/u CT in 3 months  . Migraine    "bad when I was in 5th or 6th grade; I can usually ward them off by resting my head now" (04/19/2013)  . MVP (mitral valve prolapse)   . S/P minimally-invasive mitral valve repair 08/03/2019   Complex valvuloplasty including artificial Gore-tex neochords x12 and Sorin Memo 4D ring annuloplasty, size 38 mm  . S/P patent foramen ovale closure 08/03/2019  . Sleep apnea    has not used mouth guard in 5 years no cpap used   . Squamous carcinoma ?1999   "upper left groin" (04/19/2013)   Past Surgical History:  Procedure Laterality Date  . CARDIAC CATHETERIZATION  05/26/2019  . CHOLECYSTECTOMY  04/18/2013  . CHOLECYSTECTOMY N/A 04/18/2013   Procedure: LAPAROSCOPIC CHOLECYSTECTOMY WITH INTRAOPERATIVE CHOLANGIOGRAM;  Surgeon: Ralene Ok, MD;  Location: [place];  Service: General;  Laterality: N/A;  . COLONOSCOPY W/ POLYPECTOMY    . EYE SURGERY Bilateral FALL  2016   IOC FOR CATARACTS  . JOINT REPLACEMENT    . KNEE ARTHROSCOPY Right 12/2007   "meniscus repair" (04/19/2013)  . MITRAL VALVE REPAIR Right 08/03/2019   Procedure: MINIMALLY INVASIVE MITRAL VALVE REPAIR (MVR) using Memo 4D 38 MM Mitral Valve.;  Surgeon: Rexene Alberts, MD;  Location: [place];  Service: Open Heart Surgery;  Laterality: Right;  . REPAIR OF PATENT FORAMEN OVALE N/A 08/03/2019   Procedure: CLOSURE OF PATENT FORAMEN OVALE;  Surgeon: Rexene Alberts, MD;  Location: [place];  Service: Open Heart Surgery;  Laterality: N/A;  . RETINAL TEAR REPAIR CRYOTHERAPY Left ~ 1997  . RIGHT/LEFT HEART CATH AND CORONARY ANGIOGRAPHY N/A 05/26/2019   Procedure: RIGHT/LEFT HEART CATH AND CORONARY ANGIOGRAPHY;  Surgeon: Jettie Booze, MD;  Location: [place] [facility];  Service: Cardiovascular;  Laterality: N/A;  . TEE WITHOUT CARDIOVERSION N/A 05/26/2019   Procedure: TRANSESOPHAGEAL ECHOCARDIOGRAM (TEE);  Surgeon: Josue Hector, MD;  Location:  [facility] ENDOSCOPY;  Service: Cardiovascular;  Laterality: N/A;  . TEE WITHOUT CARDIOVERSION N/A 08/03/2019   Procedure: TRANSESOPHAGEAL ECHOCARDIOGRAM (TEE);  Surgeon: Rexene Alberts, MD;  Location: [place];  Service: Open Heart Surgery;  Laterality: N/A;  . TONSILLECTOMY  ~ 1952  . TOTAL KNEE ARTHROPLASTY Right 09/21/2016   Procedure: RIGHT TOTAL KNEE ARTHROPLASTY;  Surgeon: Gaynelle Arabian, MD;  Location: [facility];  Service: Orthopedics;  Laterality: Right;  requests 16mins with abductor block  . VARICOSE VEIN SURGERY Right 2000's  . VASCULAR SURGERY    . WRIST SURGERY Right 1975   "had a knot taken off" (04/19/2013)    Current Outpatient Medications  Medication Sig Dispense Refill  . acetaminophen (TYLENOL) 500 MG tablet Take 500-1,000 mg by mouth every 6 (six) hours as needed for moderate pain (depends on pain if takes 1-2 tablets).    [name] albuterol (PROVENTIL) (2.5 MG/3ML) 0.083% nebulizer solution Take 2.5 mg by nebulization every 4 (four) hours as needed for shortness of breath.     [name] albuterol (VENTOLIN HFA) 108 (90 Base) MCG/ACT inhaler Inhale 1-2 puffs into the lungs every 4 (four) hours as needed for wheezing or shortness of breath. PRO AIR    . amiodarone (PACERONE) 200 MG tablet Take 1 tablet (200 mg total) by mouth daily. 30 tablet 1  . aspirin EC 81 MG EC tablet Take 1 tablet (81 mg total) by mouth daily.    . Biotin 5000 MCG TABS Take 5,000 mcg by mouth daily.    [name] buPROPion (WELLBUTRIN XL) 300 MG 24 hr tablet Take 300 mg by mouth daily.    . Ca Phosphate-Cholecalciferol (CALCIUM/VITAMIN D3 GUMMIES PO) Take 2 each by mouth 2 (two) times daily. Alive Woman's over 35    . Cyanocobalamin (VITAMIN B-12) 5000 MCG TBDP Take 5,000 mcg by mouth daily.    . fluticasone furoate-vilanterol (BREO ELLIPTA) 100-25 MCG/INH AEPB USE 1 PUFF DAILY AS DIRECTED 60 each 3  . hydroxypropyl methylcellulose / hypromellose (ISOPTO TEARS / GONIOVISC) 2.5 % ophthalmic solution Place 1 drop into both eyes daily as  needed for dry eyes.    . metoprolol tartrate (LOPRESSOR) 25 MG tablet Take 0.5 tablets (12.5 mg total) by mouth 2 (two) times daily. 30 tablet 1  . montelukast (SINGULAIR) 10 MG tablet Take 10 mg by mouth at bedtime.     . Multiple Vitamins-Minerals (MULTIVITAMIN WITH MINERALS) tablet Take 1 tablet by mouth daily. Master's complete    . traMADol (ULTRAM) 50 MG tablet Take 1 tablet (50 mg total) by mouth every 4 (four) hours as needed for severe pain. 30 tablet 0  . Vitamin D, Ergocalciferol, (DRISDOL) 1.25 MG (50000 UT) CAPS capsule Take 50,000 Units by mouth 2 (two) times a week. Sun and Thurs    . warfarin (COUMADIN) 5 MG tablet Take 1 tablet (5 mg total) by mouth daily at 6 PM. Or as directed. 30 tablet 1   No current facility-administered medications for this visit.    Allergies  Allergen  Reactions  . Penicillins Rash    Has patient had a PCN reaction causing immediate rash, facial/tongue/throat swelling, SOB or lightheadedness with hypotension: unknown Has patient had a PCN reaction causing severe rash involving mucus membranes or skin necrosis: no Has patient had a PCN reaction that required hospitalization: unknown Has patient had a PCN reaction occurring within the last 10 years: no If all of the above answers are "NO", then may proceed with Cephalosporin use.   . Codeine Other (See Comments)    Possible sensitivity, patient not sure  . Milk-Related Compounds Nausea And Vomiting and Other (See Comments)    Can have ice cream, pt just doesn't want to drink milk  . Morphine And Related     "strange sensation across midsection and chest"    Social History   Socioeconomic History  . Marital status: Widowed    Spouse name: Not on file  . Number of children: Not on file  . Years of education: Not on file  . Highest education level: Not on file  Occupational History  . Not on file  Tobacco Use  . Smoking status: Former Smoker    Packs/day: 1.50    Years: 20.00    Pack  years: 30.00    Types: Cigarettes    Quit date: 11/30/1986    Years since quitting: 32.7  . Smokeless tobacco: Never Used  Substance and Sexual Activity  . Alcohol use: Yes    Comment: RARE  . Drug use: No  . Sexual activity: Never  Other Topics Concern  . Not on file  Social History Narrative  . Not on file   Social Determinants of Health   Financial Resource Strain:   . Difficulty of Paying Living Expenses: Not on file  Food Insecurity:   . Worried About Charity fundraiser in the Last Year: Not on file  . Ran Out of Food in the Last Year: Not on file  Transportation Needs:   . Lack of Transportation (Medical): Not on file  . Lack of Transportation (Non-Medical): Not on file  Physical Activity:   . Days of Exercise per Week: Not on file  . Minutes of Exercise per Session: Not on file  Stress:   . Feeling of Stress : Not on file  Social Connections:   . Frequency of Communication with Friends and Family: Not on file  . Frequency of Social Gatherings with Friends and Family: Not on file  . Attends Religious Services: Not on file  . Active Member of Clubs or Organizations: Not on file  . Attends Archivist Meetings: Not on file  . Marital Status: Not on file  Intimate Partner Violence:   . Fear of Current or Ex-Partner: Not on file  . Emotionally Abused: Not on file  . Physically Abused: Not on file  . Sexually Abused: Not on file   Review of Systems: General: No chills, fever, night sweats or weight changes  Cardiovascular:  No chest pain, dyspnea on exertion, edema, orthopnea, palpitations, paroxysmal nocturnal dyspnea Dermatological: No rash, lesions or masses Respiratory: No cough, dyspnea Urologic: No hematuria, dysuria Abdominal: No nausea, vomiting, diarrhea, bright red blood per rectum, melena, or hematemesis Neurologic: No visual changes, weakness, changes in mental status All other systems reviewed and are otherwise negative except as noted  above.  Physical Exam: Vitals:   08/22/19 0947  BP: 116/62  Pulse: 63  SpO2: 97%  Weight: 171 lb 12.8 oz (77.9 kg)  Height: 5\' 3"  (  1.6 m)   GEN- The patient is well appearing, alert and oriented x 3 today.   HEENT: normocephalic, atraumatic; sclera clear, conjunctiva pink; hearing intact; oropharynx clear; neck supple, no JVP Lymph- no cervical lymphadenopathy Lungs- Clear to ausculation bilaterally, normal work of breathing.  No wheezes, rales, rhonchi Heart- Regular rate and rhythm, no murmurs, rubs or gallops, PMI not laterally displaced GI- soft, non-tender, non-distended, bowel sounds present, no hepatosplenomegaly Extremities- no clubbing, cyanosis, or edema; DP/PT/radial pulses 2+ bilaterally MS- no significant deformity or atrophy Skin- warm and dry, no rash or lesion Psych- euthymic mood, full affect Neuro- strength and sensation are intact  EKG is ordered. Personal review of EKG from today shows NSR at 63 bpm with QRS 92 ms and PR interval 156 ms  Assessment and Plan:  1. Severe MR due to myxomatous mitral valve S/p MVR 08/03/19.  Doing well. Needs Echo end of February, Structural heart team to read. Ordered, will schedule.   2. Palpitations Occured prior to surgery in the setting of severe MR; She has had no further since MVR.   3. Paroxysmal AF Noted in the post op phase. She is on coumadin for her valve. She remains on low dose amiodarone at 200 mg daily. Suspect may be able to stop after a few more weeks (she is only about 2 weeks post op.). Will discuss with Dr. Curt Bears.   4. Left lower lobe lung nodule Found during work up for surgery. Per patient, work up ongoing and likely plan lobectomy.   Doing well post op. Plan for Echo in several weeks for structural heart team to read.  Will review literature for post op AF and discuss with Dr. Curt Bears.  RTC 6 months. Sooner as needed.   Shirley Friar, PA-C  08/22/19 10:34 AM

## 2019-08-22 ENCOUNTER — Encounter: Payer: Self-pay | Admitting: Thoracic Surgery (Cardiothoracic Vascular Surgery)

## 2019-08-22 ENCOUNTER — Encounter: Payer: Self-pay | Admitting: Student

## 2019-08-22 ENCOUNTER — Ambulatory Visit: Payer: PPO | Admitting: Student

## 2019-08-22 ENCOUNTER — Ambulatory Visit (INDEPENDENT_AMBULATORY_CARE_PROVIDER_SITE_OTHER): Payer: PPO | Admitting: Student

## 2019-08-22 ENCOUNTER — Ambulatory Visit (INDEPENDENT_AMBULATORY_CARE_PROVIDER_SITE_OTHER): Payer: PPO | Admitting: *Deleted

## 2019-08-22 VITALS — BP 116/62 | HR 63 | Ht 63.0 in | Wt 171.8 lb

## 2019-08-22 DIAGNOSIS — Z9889 Other specified postprocedural states: Secondary | ICD-10-CM | POA: Diagnosis not present

## 2019-08-22 DIAGNOSIS — Z5181 Encounter for therapeutic drug level monitoring: Secondary | ICD-10-CM

## 2019-08-22 DIAGNOSIS — I340 Nonrheumatic mitral (valve) insufficiency: Secondary | ICD-10-CM | POA: Diagnosis not present

## 2019-08-22 DIAGNOSIS — R002 Palpitations: Secondary | ICD-10-CM

## 2019-08-22 LAB — POCT INR: INR: 2.1 (ref 2.0–3.0)

## 2019-08-22 NOTE — Patient Instructions (Addendum)
Medication Instructions:  none *If you need a refill on your cardiac medications before your next appointment, please call your pharmacy*  Lab Work: none If you have labs (blood work) drawn today and your tests are completely normal, you will receive your results only by: [name] MyChart Message (if you have MyChart) OR . A paper copy in the mail If you have any lab test that is abnormal or we need to change your treatment, we will call you to review the results.  Testing/Procedures:PLEASE SCHEDULE end of February Your physician has requested that you have an echocardiogram. Echocardiography is a painless test that uses sound waves to create images of your heart. It provides your doctor with information about the size and shape of your heart and how well your heart's chambers and valves are working. This procedure takes approximately one hour. There are no restrictions for this procedure.    Follow-Up: 6 months with Dr Curt Bears At [facility], you and your health needs are our priority.  As part of our continuing mission to provide you with exceptional heart care, we have created designated Provider Care Teams.  These Care Teams include your primary Cardiologist (physician) and Advanced Practice Providers (APPs -  Physician Assistants and Nurse Practitioners) who all work together to provide you with the care you need, when you need it.

## 2019-08-22 NOTE — Patient Instructions (Signed)
Description   Continue taking 1 tablet daily except 1.5 tablets on Wednesdays. Recheck INR in 1 week.  Coumadin Clinic [phone] Main [phone]

## 2019-08-23 ENCOUNTER — Other Ambulatory Visit: Payer: Self-pay | Admitting: Thoracic Surgery (Cardiothoracic Vascular Surgery)

## 2019-08-23 DIAGNOSIS — Z9889 Other specified postprocedural states: Secondary | ICD-10-CM

## 2019-08-28 ENCOUNTER — Other Ambulatory Visit: Payer: Self-pay | Admitting: *Deleted

## 2019-08-28 ENCOUNTER — Ambulatory Visit
Admission: RE | Admit: 2019-08-28 | Discharge: 2019-08-28 | Disposition: A | Discharge status: Home / Self Care | Payer: PPO | Source: Ambulatory Visit | Attending: Thoracic Surgery (Cardiothoracic Vascular Surgery) | Admitting: Thoracic Surgery (Cardiothoracic Vascular Surgery)

## 2019-08-28 ENCOUNTER — Ambulatory Visit (INDEPENDENT_AMBULATORY_CARE_PROVIDER_SITE_OTHER): Payer: Self-pay | Admitting: Physician Assistant

## 2019-08-28 ENCOUNTER — Ambulatory Visit: Payer: PPO | Attending: Internal Medicine

## 2019-08-28 ENCOUNTER — Other Ambulatory Visit: Payer: Self-pay

## 2019-08-28 VITALS — BP 116/62 | HR 65 | Temp 98.1°F | Resp 16 | Ht 62.5 in | Wt 171.0 lb

## 2019-08-28 DIAGNOSIS — Z952 Presence of prosthetic heart valve: Secondary | ICD-10-CM | POA: Diagnosis not present

## 2019-08-28 DIAGNOSIS — R911 Solitary pulmonary nodule: Secondary | ICD-10-CM

## 2019-08-28 DIAGNOSIS — Z23 Encounter for immunization: Secondary | ICD-10-CM

## 2019-08-28 DIAGNOSIS — Z9889 Other specified postprocedural states: Secondary | ICD-10-CM

## 2019-08-28 DIAGNOSIS — Z09 Encounter for follow-up examination after completed treatment for conditions other than malignant neoplasm: Secondary | ICD-10-CM

## 2019-08-28 NOTE — Patient Instructions (Signed)
You may return to driving an automobile as long as you are no longer requiring oral narcotic pain relievers during the daytime.  It would be wise to start driving only short distances during the daylight and gradually increase from there as you feel comfortable.  Endocarditis is a potentially serious infection of heart valves or inside lining of the heart.  It occurs more commonly in patients with diseased heart valves (such as patient's with aortic or mitral valve disease) and in patients who have undergone heart valve repair or replacement.  Certain surgical and dental procedures may put you at risk, such as dental cleaning, other dental procedures, or any surgery involving the respiratory, urinary, gastrointestinal tract, gallbladder or prostate gland.   To minimize your chances for develooping endocarditis, maintain good oral health and seek prompt medical attention for any infections involving the mouth, teeth, gums, skin or urinary tract.    Always notify your doctor or dentist about your underlying heart valve condition before having any invasive procedures. You will need to take antibiotics before certain procedures, including all routine dental cleanings or other dental procedures.  Your cardiologist or dentist should prescribe these antibiotics for you to be taken ahead of time.  You are encouraged to enroll and participate in the outpatient cardiac rehab program beginning as soon as practical.

## 2019-08-28 NOTE — Progress Notes (Signed)
HPI:  Patient returns for routine postoperative follow-up having undergone a minimally invasive mitral valve repair and closure PFO on 08/03/2019 by Dr. Roxy Manns. She denies shortness of breath or chest pain.    Current Outpatient Medications  Medication Sig Dispense Refill  . acetaminophen (TYLENOL) 500 MG tablet Take 500-1,000 mg by mouth every 6 (six) hours as needed for moderate pain (depends on pain if takes 1-2 tablets).    [name] albuterol (PROVENTIL) (2.5 MG/3ML) 0.083% nebulizer solution Take 2.5 mg by nebulization every 4 (four) hours as needed for shortness of breath.     [name] albuterol (VENTOLIN HFA) 108 (90 Base) MCG/ACT inhaler Inhale 1-2 puffs into the lungs every 4 (four) hours as needed for wheezing or shortness of breath. PRO AIR    . amiodarone (PACERONE) 200 MG tablet Take 1 tablet (200 mg total) by mouth daily. 30 tablet 1  . aspirin EC 81 MG EC tablet Take 1 tablet (81 mg total) by mouth daily.    . Biotin 5000 MCG TABS Take 5,000 mcg by mouth daily.    [name] buPROPion (WELLBUTRIN XL) 300 MG 24 hr tablet Take 300 mg by mouth daily.    . Ca Phosphate-Cholecalciferol (CALCIUM/VITAMIN D3 GUMMIES PO) Take 2 each by mouth 2 (two) times daily. Alive Woman's over 94    . Cyanocobalamin (VITAMIN B-12) 5000 MCG TBDP Take 5,000 mcg by mouth daily.    . fluticasone furoate-vilanterol (BREO ELLIPTA) 100-25 MCG/INH AEPB USE 1 PUFF DAILY AS DIRECTED 60 each 3  . hydroxypropyl methylcellulose / hypromellose (ISOPTO TEARS / GONIOVISC) 2.5 % ophthalmic solution Place 1 drop into both eyes daily as needed for dry eyes.    . metoprolol tartrate (LOPRESSOR) 25 MG tablet Take 0.5 tablets (12.5 mg total) by mouth 2 (two) times daily. 30 tablet 1  . montelukast (SINGULAIR) 10 MG tablet Take 10 mg by mouth at bedtime.     . Multiple Vitamins-Minerals (MULTIVITAMIN WITH MINERALS) tablet Take 1 tablet by mouth daily. Master's complete    . traMADol (ULTRAM) 50 MG tablet Take 1 tablet (50 mg total) by mouth every  4 (four) hours as needed for severe pain. 30 tablet 0  . Vitamin D, Ergocalciferol, (DRISDOL) 1.25 MG (50000 UT) CAPS capsule Take 50,000 Units by mouth 2 (two) times a week. Sun and Thurs    . warfarin (COUMADIN) 5 MG tablet Take 1 tablet (5 mg total) by mouth daily at 6 PM. Or as directed. 30 tablet 1  Vital Signs: Temp 98.1BP 116/62, HR 65, Oxygenation 95% on room air   Physical Exam: CV-RRR, no murmur Pulmonary-Clear to auscultation bilaterally Extremities-No Le edema Wounds-Clean, dry, well healed  Diagnostic Tests: CLINICAL DATA:  Status post mitral valve repair.  EXAM: CHEST - 2 VIEW  COMPARISON:  August 07, 2019.  FINDINGS: Stable cardiomediastinal silhouette. Status post cardiac valve repair. No pneumothorax or pleural effusion noted. Both lungs are clear. The visualized skeletal structures are unremarkable.  IMPRESSION: No active cardiopulmonary disease.   Electronically Signed   By: Marijo Conception M.D.   On: 08/28/2019 12:15   Impression and Plan: Overall, she is recovering well from mitral valve repair and closure of PFO. She has seen Mr. Tillery PA-C from Dr. Curt Bears' office on 02/02. EKG done at that time showed sinus rhythm. She is on Amiodarone 200 mg daily and hopefully, will be stopped in the next few months, as long as continues to maintain sinus rhythm. Her last INR was drawn on 02/02 and was  2.1. She has an appointment on 02/09 to have it checked again. She was instructed she may begin driving, as she is not taking narcotics for pain. We had a discussion regarding endocarditis prophylaxis (antibiotics prior to any dental procedure). She was encouraged to participate in cardiac rehab and is agreeable. She asked when she could pick up her 17 pound dog and I asked her to wait one more week. Also, she has a LLL nodule. A super D CT of the chest will be obtained and follow up arranged accordingly with thoracic surgeon (likely Dr. Roxan Hockey). She will also  follow up with Dr. Roxy Manns in 6 weeks.    Nani Skillern, PA-C [facility] [phone]

## 2019-08-28 NOTE — Progress Notes (Signed)
   Covid-19 Vaccination Clinic  Name:  JAHDAI PADOVANO    MRN: [id number] DOB: 1945/09/09  08/28/2019  Ms. Bickle was observed post Covid-19 immunization for 15 minutes without incidence. She was provided with Vaccine Information Sheet and instruction to access the V-Safe system.   Ms. Hernandez was instructed to call 911 with any severe reactions post vaccine: [name] Difficulty breathing  . Swelling of your face and throat  . A fast heartbeat  . A bad rash all over your body  . Dizziness and weakness    Immunizations Administered    Name Date Dose VIS Date Route   Pfizer COVID-19 Vaccine 08/28/2019  1:30 PM 0.3 mL 06/30/2019 Intramuscular   Manufacturer: [place]   Lot: [id number]   [place]: [address]-6

## 2019-08-29 ENCOUNTER — Ambulatory Visit (INDEPENDENT_AMBULATORY_CARE_PROVIDER_SITE_OTHER): Payer: PPO | Admitting: *Deleted

## 2019-08-29 DIAGNOSIS — Z5181 Encounter for therapeutic drug level monitoring: Secondary | ICD-10-CM

## 2019-08-29 DIAGNOSIS — Z9889 Other specified postprocedural states: Secondary | ICD-10-CM

## 2019-08-29 LAB — POCT INR: INR: 2.2 (ref 2.0–3.0)

## 2019-08-29 NOTE — Patient Instructions (Signed)
Description   Continue taking 1 tablet daily except 1.5 tablets on Wednesdays. Recheck INR in 2 weeks.  Coumadin Clinic [phone] Main [phone]

## 2019-09-01 ENCOUNTER — Telehealth (HOSPITAL_COMMUNITY): Payer: Self-pay

## 2019-09-01 NOTE — Telephone Encounter (Signed)
Pt insurance is active and benefits verified through [employer] adv Co-pay $10, DED 0/0 met, out of pocket $3,100/0 met, co-insurance 0%. no pre-authorization required, REF# [id number]  Will contact patient to see if she is interested in the Cardiac Rehab Program. If interested, patient will need to complete follow up appt. Once completed, patient will be contacted for scheduling upon review by the RN Navigator.

## 2019-09-01 NOTE — Telephone Encounter (Signed)
Cardiac Rehab Note:  Successful telephone encounter to Anne Hogan to discuss referral and enrollment in CR s/p Minimally Invasive MVR with closure of PFO. Patient is interested and provided with insurance coverage information. Patient is scheduled for CR orientation 09/07/19 at 1:30 and will begin exercise sessions 09/11/19 in the 3:00 class. Patient appreciative of call. Will be contacted prior to orientation date for pre-orientation health review.  Pairlee Sawtell E. Rollene Rotunda RN, BSN [place]. [facility]  Cardiac and Pulmonary Rehabilitation [place] Direct: [phone]

## 2019-09-03 ENCOUNTER — Telehealth (HOSPITAL_COMMUNITY): Payer: Self-pay | Admitting: Pharmacist

## 2019-09-04 NOTE — Telephone Encounter (Signed)
Cardiac Rehab Medication Review by a Pharmacist  Does the patient  feel that his/her medications are working for him/her?  yes  Has the patient been experiencing any side effects to the medications prescribed?  no  Does the patient measure his/her own blood pressure or blood glucose at home?  no   Does the patient have any problems obtaining medications due to transportation or finances?   no  Understanding of regimen: excellent Understanding of indications: good Potential of compliance: excellent    Pharmacist comments:  - Attempted to clarify patient's allergy to penicillin. She states that she had dental surgery a long time ago and developed cellulitis secondary to dental abscess in which she received "a lot" of penicillin and then was told to not take penicillin again. When asked about if she developed signs of allergy she stated she had a rash. She specifically stated she had no throat swelling or shortness of breath noted.  - Patient reports a year long occurrence of "scaling" or "splitting" tongue that she cannot attribute a cause to. She thinks it may be from her ODT biotin and B12 and is trying to decrease how much she takes them to attempt to correct her tongue.  - Although noted in her medication list that she is taking multivitamins, B12, biotin, and Ca/D gummies, patient reports she doesn't take them every day, but is trying to take them more.  - Patient had questions on where the cardiac rehab program is located and what it is. She did not receive a packet of information in the mail yet. I will email the cardiac rehab schedulers and request they give her a phone call to answer her questions.    Thank you,   Eddie Candle, PharmD PGY-1 Pharmacy Resident   Please check amion for clinical pharmacist contact number  09/04/2019 2:35 PM

## 2019-09-06 NOTE — Telephone Encounter (Signed)
Called pt to reschedule cardiac rehab orientation and exercise sessions due to inclement weather. Pt will come in for orientation on 09/14/2019@1 :30pm and 3:00pm class time.

## 2019-09-07 ENCOUNTER — Ambulatory Visit (HOSPITAL_COMMUNITY): Payer: PPO

## 2019-09-11 ENCOUNTER — Ambulatory Visit (HOSPITAL_COMMUNITY): Payer: PPO

## 2019-09-13 ENCOUNTER — Ambulatory Visit (HOSPITAL_COMMUNITY): Payer: PPO | Attending: Cardiology

## 2019-09-13 ENCOUNTER — Ambulatory Visit (INDEPENDENT_AMBULATORY_CARE_PROVIDER_SITE_OTHER): Payer: PPO

## 2019-09-13 ENCOUNTER — Telehealth (HOSPITAL_COMMUNITY): Payer: Self-pay

## 2019-09-13 ENCOUNTER — Ambulatory Visit (HOSPITAL_COMMUNITY): Payer: PPO

## 2019-09-13 ENCOUNTER — Other Ambulatory Visit: Payer: Self-pay

## 2019-09-13 DIAGNOSIS — Z9889 Other specified postprocedural states: Secondary | ICD-10-CM

## 2019-09-13 DIAGNOSIS — R011 Cardiac murmur, unspecified: Secondary | ICD-10-CM | POA: Insufficient documentation

## 2019-09-13 DIAGNOSIS — Z87891 Personal history of nicotine dependence: Secondary | ICD-10-CM | POA: Insufficient documentation

## 2019-09-13 DIAGNOSIS — I509 Heart failure, unspecified: Secondary | ICD-10-CM | POA: Diagnosis not present

## 2019-09-13 DIAGNOSIS — I083 Combined rheumatic disorders of mitral, aortic and tricuspid valves: Secondary | ICD-10-CM | POA: Insufficient documentation

## 2019-09-13 DIAGNOSIS — Z8249 Family history of ischemic heart disease and other diseases of the circulatory system: Secondary | ICD-10-CM | POA: Diagnosis not present

## 2019-09-13 DIAGNOSIS — Z952 Presence of prosthetic heart valve: Secondary | ICD-10-CM

## 2019-09-13 DIAGNOSIS — G4730 Sleep apnea, unspecified: Secondary | ICD-10-CM | POA: Diagnosis not present

## 2019-09-13 DIAGNOSIS — Z5181 Encounter for therapeutic drug level monitoring: Secondary | ICD-10-CM

## 2019-09-13 DIAGNOSIS — I341 Nonrheumatic mitral (valve) prolapse: Secondary | ICD-10-CM

## 2019-09-13 DIAGNOSIS — I059 Rheumatic mitral valve disease, unspecified: Secondary | ICD-10-CM | POA: Diagnosis present

## 2019-09-13 LAB — POCT INR: INR: 3.7 — AB (ref 2.0–3.0)

## 2019-09-13 NOTE — Telephone Encounter (Signed)
Cardiac Rehab Note:  Successful telephone encounter to Anne Hogan to confirm Cardiac Rehab orientation appointment scheduled for tomorrow, 09/14/19 at 1:30. Per [name], she has recently "pulled my back" and is in a lot of lower back pain. States she is fine at rest however when she is ambulatory or moves from a standing to sitting she is in a lot of pain. She has a call in to her PCP. She states injury occurred while getting out of recliner. [name] request to delay her CR orientation by 1 week.  Plan: Orientation rescheduled for 09/21/19 at 10:30  Rosellen Lichtenberger E. Rollene Rotunda RN, BSN [employer]. [facility]  Cardiac and Pulmonary Rehabilitation [place] Direct: [phone]

## 2019-09-13 NOTE — Patient Instructions (Signed)
Description   Skip today's dosage of Coumadin, then resume same dosage 1 tablet daily except 1.5 tablets on Wednesdays. Recheck INR in 2 weeks.  Coumadin Clinic [phone] Main [phone]

## 2019-09-14 ENCOUNTER — Ambulatory Visit (HOSPITAL_COMMUNITY): Payer: PPO

## 2019-09-15 ENCOUNTER — Ambulatory Visit (HOSPITAL_COMMUNITY): Payer: PPO

## 2019-09-18 ENCOUNTER — Ambulatory Visit (HOSPITAL_COMMUNITY): Payer: PPO

## 2019-09-18 ENCOUNTER — Ambulatory Visit: Payer: PPO | Attending: Internal Medicine

## 2019-09-18 ENCOUNTER — Ambulatory Visit: Payer: PPO

## 2019-09-18 ENCOUNTER — Telehealth (HOSPITAL_COMMUNITY): Payer: Self-pay | Admitting: Student-PharmD

## 2019-09-18 ENCOUNTER — Telehealth: Payer: Self-pay

## 2019-09-18 DIAGNOSIS — Z23 Encounter for immunization: Secondary | ICD-10-CM

## 2019-09-18 NOTE — Telephone Encounter (Signed)
Cardiac Rehab Medication Review by a Pharmacist  Does the patient  feel that his/her medications are working for him/her?  No   Has the patient been experiencing any side effects to the medications prescribed?  No   Does the patient measure his/her own blood pressure or blood glucose at home?  No. Last time >2 weeks ago, reading 116/65.  Does the patient have any problems obtaining medications due to transportation or finances?   No  Understanding of regimen: excellent Understanding of indications: good Potential of compliance: excellent    Pharmacist comments: Anne Hogan was able to recall medications and regimen well. She tends to check her blood pressure at home but haven't in the lat 2 weeks due to frequent doctors visit for her injury, where they check her BP there.     Anne Hogan 09/18/2019 3:50 PM

## 2019-09-18 NOTE — Progress Notes (Signed)
   Covid-19 Vaccination Clinic  Name:  Anne Hogan    MRN: [id number] DOB: [birth year]  09/18/2019  [name] was observed post Covid-19 immunization for 15 minutes without incidence. She was provided with Vaccine Information Sheet and instruction to access the V-Safe system.   [name] was instructed to call 911 with any severe reactions post vaccine: [name] Difficulty breathing  . Swelling of your face and throat  . A fast heartbeat  . A bad rash all over your body  . Dizziness and weakness    Immunizations Administered    Name Date Dose VIS Date Route   Pfizer COVID-19 Vaccine 09/18/2019 10:32 AM 0.3 mL 06/30/2019 Intramuscular   Manufacturer: [place]   Lot: [id number]   [place]: [id number]

## 2019-09-18 NOTE — Telephone Encounter (Signed)
-----   Message from Rexene Alberts, MD sent at 09/15/2019  2:33 PM EST ----- ECHO looks terrific ----- Message ----- From: Shirley Friar, Hershal Coria Sent: 09/15/2019  11:09 AM EST To: Rexene Alberts, MD, Frederik Schmidt, RN  Could you please let her know that EF is normal.  Her valve looks stable with no evidence of stiffness, or leakiness. I have forwarded it to Dr. Roxy Manns for review as well.  Thank you!

## 2019-09-18 NOTE — Telephone Encounter (Signed)
The patient has been notified of the Echo result and verbalized understanding.  All questions (if any) were answered. Frederik Schmidt, RN 09/18/2019 8:13 AM

## 2019-09-20 ENCOUNTER — Telehealth (HOSPITAL_COMMUNITY): Payer: Self-pay

## 2019-09-20 ENCOUNTER — Ambulatory Visit (HOSPITAL_COMMUNITY): Payer: PPO

## 2019-09-20 NOTE — Telephone Encounter (Signed)
Cardiac Rehab Note:  Successful telephone encounter to Anne Hogan to confirm Cardiac Rehab orientation appointment scheduled for tomorrow, 09/20/19 at 10:30. Per [name], she continues to suffer from frequent back spasms even with taking flexeril daily as prescribed. States she takes 2 at a time. She is strongly encouraged to contact her PCP today for a face to face appointment as the recently prescribed muscle relaxer is not effective. States she is fine at rest however when she is ambulatory or moves from a standing to sitting she is in a lot of pain. She states injury occurred while getting out of recliner. [name] request to delay her CR orientation and request follow up for rescheduling in 1 week.  Kimberlin Scheel E. Rollene Rotunda RN, BSN [place]. [facility]  Cardiac and Pulmonary Rehabilitation Phone: [phone] Fax: [phone]

## 2019-09-21 ENCOUNTER — Ambulatory Visit (HOSPITAL_COMMUNITY): Payer: PPO

## 2019-09-22 ENCOUNTER — Ambulatory Visit (HOSPITAL_COMMUNITY): Payer: PPO

## 2019-09-25 ENCOUNTER — Ambulatory Visit (HOSPITAL_COMMUNITY): Payer: PPO

## 2019-09-27 ENCOUNTER — Ambulatory Visit (HOSPITAL_COMMUNITY): Payer: PPO

## 2019-09-27 ENCOUNTER — Telehealth (HOSPITAL_COMMUNITY): Payer: Self-pay

## 2019-09-27 ENCOUNTER — Other Ambulatory Visit: Payer: Self-pay

## 2019-09-27 ENCOUNTER — Ambulatory Visit (INDEPENDENT_AMBULATORY_CARE_PROVIDER_SITE_OTHER): Payer: PPO | Admitting: *Deleted

## 2019-09-27 DIAGNOSIS — Z5181 Encounter for therapeutic drug level monitoring: Secondary | ICD-10-CM

## 2019-09-27 DIAGNOSIS — Z9889 Other specified postprocedural states: Secondary | ICD-10-CM | POA: Diagnosis not present

## 2019-09-27 DIAGNOSIS — I341 Nonrheumatic mitral (valve) prolapse: Secondary | ICD-10-CM | POA: Diagnosis not present

## 2019-09-27 LAB — POCT INR: INR: 1.4 — AB (ref 2.0–3.0)

## 2019-09-27 NOTE — Telephone Encounter (Signed)
Called pt to see if she was ready to reschedule her cardiac rehab sessions, pt stated that she was feeling much better last few days but she started doing some new exercises and re hurt her back she stated that she really wants to do the cardiac rehab program and will put some hot compress on it and call back the next day if she is feeling better to reschedule.

## 2019-09-27 NOTE — Patient Instructions (Addendum)
Description   Today take 2 tablets and tomorrow take 1.5 tablets then resume same dosage 1 tablet daily except 1.5 tablets on Wednesdays. Recheck INR in 1 week.  Coumadin Clinic [phone] Main [phone]

## 2019-09-29 ENCOUNTER — Ambulatory Visit (HOSPITAL_COMMUNITY): Payer: PPO

## 2019-10-02 ENCOUNTER — Ambulatory Visit
Admission: RE | Admit: 2019-10-02 | Discharge: 2019-10-02 | Disposition: A | Discharge status: Home / Self Care | Payer: PPO | Source: Ambulatory Visit | Attending: Thoracic Surgery (Cardiothoracic Vascular Surgery) | Admitting: Thoracic Surgery (Cardiothoracic Vascular Surgery)

## 2019-10-02 ENCOUNTER — Ambulatory Visit (HOSPITAL_COMMUNITY): Payer: PPO

## 2019-10-02 ENCOUNTER — Other Ambulatory Visit: Payer: Self-pay | Admitting: Cardiology

## 2019-10-02 DIAGNOSIS — R918 Other nonspecific abnormal finding of lung field: Secondary | ICD-10-CM | POA: Diagnosis not present

## 2019-10-02 DIAGNOSIS — R911 Solitary pulmonary nodule: Secondary | ICD-10-CM

## 2019-10-02 NOTE — Telephone Encounter (Signed)
Called patient to see if she was interested in participating in the Cardiac Rehab Program. Patient stated yes. Patient will come in for orientation on 11/07/19 @10 :30AM and will attend the 1:45PM exercise class.  Tourist information centre manager.

## 2019-10-04 ENCOUNTER — Ambulatory Visit (HOSPITAL_COMMUNITY): Payer: PPO

## 2019-10-04 ENCOUNTER — Ambulatory Visit (INDEPENDENT_AMBULATORY_CARE_PROVIDER_SITE_OTHER): Payer: PPO | Admitting: Pharmacist

## 2019-10-04 ENCOUNTER — Other Ambulatory Visit: Payer: Self-pay

## 2019-10-04 DIAGNOSIS — Z5181 Encounter for therapeutic drug level monitoring: Secondary | ICD-10-CM | POA: Diagnosis not present

## 2019-10-04 DIAGNOSIS — I341 Nonrheumatic mitral (valve) prolapse: Secondary | ICD-10-CM

## 2019-10-04 DIAGNOSIS — Z9889 Other specified postprocedural states: Secondary | ICD-10-CM | POA: Diagnosis not present

## 2019-10-04 LAB — POCT INR: INR: 1.7 — AB (ref 2.0–3.0)

## 2019-10-04 NOTE — Patient Instructions (Signed)
Today take 2 tablets today then resume same dosage 1 tablet daily except 1.5 tablets on Wednesdays. Recheck INR in 1 week.  Coumadin Clinic [phone] Main [phone]

## 2019-10-05 ENCOUNTER — Telehealth: Payer: Self-pay | Admitting: *Deleted

## 2019-10-05 NOTE — Telephone Encounter (Signed)
Patient called and stated she was unsure if she had taken the Warfarin 2 tablets last night as instructed. She does not see the other half in her bottle that would indicate she did break a tablet in half so she may have taken 2 tablets as instructed. Advised with patient that she should take her regular dose tonight of 1 tablet since the other half is not in the bottle and she was okay with the plan.  Patient will reduce her dark green leafy intake as well for modification. She is aware to call back if needed and we will happily assist.

## 2019-10-06 ENCOUNTER — Ambulatory Visit (HOSPITAL_COMMUNITY): Payer: PPO

## 2019-10-09 ENCOUNTER — Ambulatory Visit (HOSPITAL_COMMUNITY): Payer: PPO

## 2019-10-09 ENCOUNTER — Encounter: Payer: Self-pay | Admitting: Thoracic Surgery (Cardiothoracic Vascular Surgery)

## 2019-10-09 ENCOUNTER — Other Ambulatory Visit: Payer: Self-pay

## 2019-10-09 ENCOUNTER — Ambulatory Visit (INDEPENDENT_AMBULATORY_CARE_PROVIDER_SITE_OTHER): Payer: Self-pay | Admitting: Thoracic Surgery (Cardiothoracic Vascular Surgery)

## 2019-10-09 VITALS — BP 120/79 | HR 69 | Temp 96.3°F | Resp 16 | Ht 62.5 in | Wt 169.2 lb

## 2019-10-09 DIAGNOSIS — Z9889 Other specified postprocedural states: Secondary | ICD-10-CM

## 2019-10-09 DIAGNOSIS — Z8774 Personal history of (corrected) congenital malformations of heart and circulatory system: Secondary | ICD-10-CM

## 2019-10-09 MED ORDER — AMIODARONE HCL 200 MG PO TABS: 100.0000 mg | ORAL_TABLET | Freq: Every day | ORAL | 3 refills | Status: DC

## 2019-10-09 NOTE — Progress Notes (Signed)
[address]       ,[place] [address]             [phone]     CARDIOTHORACIC SURGERY OFFICE NOTE  Primary Cardiologist is Will Meredith Leeds, MD PCP is [name], MD   HPI:  Patient is a 74 year old female with history of mitral valve prolapse, mitral regurgitation, palpitations, and remote history of tobacco use who returns the office today for routine follow-up s/p minimally-invasive mitral valve repair on 08/03/2019.  Her routine preoperative work-up was notable for the discovery of a large groundglass opacity with some degree of solid component in the left lower lobe that was hypermetabolic on PET imaging and suspicious for primary bronchogenic carcinoma.  Her CT and PET images were reviewed by Dr. Roxan Hockey and we made a decision to proceed with elective mitral valve repair with plans for the patient to return for definitive management of her lung mass when she had recovered from her surgery.  She was last seen here in our office on August 28, 2019 at which time she was recovering uneventfully.  She returns to our office today for routine follow-up.  She reports that she is doing exceptionally well.  All of the soreness in her right chest and breast has essentially completely resolved.  She reports that her breathing is dramatically better than it was prior to surgery.  She is walking every day and gradually increasing the distance.  She has not started the cardiac rehab program.  She had pulled her back slightly a few weeks ago and for this reason cardiac rehab was delayed.  She now states that she is feeling very well.  She has no palpitations or other symptoms to suggest a recurrence of atrial fibrillation.  She is sleeping well at night.  Appetite is good.  She denies any fevers, chills, or productive cough.   Current Outpatient Medications  Medication Sig Dispense Refill  . acetaminophen (TYLENOL) 500 MG tablet Take 500-1,000 mg by mouth every 6  (six) hours as needed for moderate pain (depends on pain if takes 1-2 tablets).    [name] albuterol (PROVENTIL) (2.5 MG/3ML) 0.083% nebulizer solution Take 2.5 mg by nebulization every 4 (four) hours as needed for shortness of breath.     . Alum Hydroxide-Mag Carbonate (GAVISCON EXTRA STRENGTH) 160-105 MG CHEW Chew 1 tablet by mouth as needed (indigestion). 2-3 times weekly at bedtime prn    . amiodarone (PACERONE) 200 MG tablet TAKE 1 TABLET ONCE DAILY. 90 tablet 3  . aspirin EC 81 MG EC tablet Take 1 tablet (81 mg total) by mouth daily.    . Biotin 5000 MCG TABS Take 5,000 mcg by mouth daily.    [name] buPROPion (WELLBUTRIN XL) 300 MG 24 hr tablet Take 300 mg by mouth daily.    . Ca Phosphate-Cholecalciferol (CALCIUM/VITAMIN D3 GUMMIES PO) Take 2 each by mouth 2 (two) times daily. Alive Woman's over 23    . Cyanocobalamin (VITAMIN B-12) 5000 MCG TBDP Take 5,000 mcg by mouth daily.    [name] docusate sodium (COLACE) 100 MG capsule Take 100 mg by mouth daily as needed for mild constipation.    . fluticasone furoate-vilanterol (BREO ELLIPTA) 100-25 MCG/INH AEPB USE 1 PUFF DAILY AS DIRECTED 60 each 3  . metoprolol tartrate (LOPRESSOR) 25 MG tablet TAKE (1/2) TABLET TWICE DAILY. 90 tablet 3  . montelukast (SINGULAIR) 10 MG tablet Take 10 mg by mouth at bedtime.     . Multiple  Vitamins-Minerals (MULTIVITAMIN WITH MINERALS) tablet Take 1 tablet by mouth daily. Master's complete    . Vitamin D, Ergocalciferol, (DRISDOL) 1.25 MG (50000 UT) CAPS capsule Take 50,000 Units by mouth 2 (two) times a week. Sun and Thurs    . warfarin (COUMADIN) 5 MG tablet Take as directed by the coumadin clinic 40 tablet 2  . cyclobenzaprine (FLEXERIL) 5 MG tablet Take 10 mg by mouth in the morning.     No current facility-administered medications for this visit.      Physical Exam:   BP 120/79 (BP Location: Left Arm, Patient Position: Sitting, Cuff Size: Normal)   Pulse 69   Temp (!) 96.3 F (35.7 C)   Resp 16   Ht 5' 2.5" (1.588  m)   Wt 169 lb 3.2 oz (76.7 kg)   SpO2 97% Comment: RA  BMI 30.45 kg/m   General:  Well-appearing  Chest:   Clear to auscultation  CV:   Regular rate and rhythm without murmur  Incisions:  Healing nicely  Abdomen:  Soft nontender  Extremities:  Warm and well-perfused  Diagnostic Tests:   ECHOCARDIOGRAM REPORT       Patient Name:  Anne Hogan Date of Exam: 09/13/2019  Medical Rec #: [id number]   Height:    62.5 in  Accession #:  [id number]  Weight:    171.0 lb  Date of Birth: 14-Feb-1946   BSA:     1.799 m  Patient Age:  71 years   BP:      116/62 mmHg  Patient Gender: F       HR:      68 bpm.  Exam Location: [address]   Procedure: 2D Echo, 3D Echo, Cardiac Doppler and Color Doppler   Indications:  Z98.890 Status Post Mitral Valve Repair    History:    Patient has prior history of Echocardiogram examinations,  most         recent 08/04/2019. CHF, Mitral Valve Prolapse,         Signs/Symptoms:Dyspnea and Murmur; Risk Factors:Former  Smoker,         Family History of Coronary Artery Disease and Sleep Apnea.         Mitral Valve Prolapse with Severe MR status post Sorin  memo ring         anuloplasty (38 mm) and PFO Closure (08-03-19), Asthma.           Mitral Valve: 38 mm prosthetic annuloplasty ring valve is         present in the mitral position. Procedure Date: 08/03/2019.    Sonographer:  Deliah Boston RDCS  Referring Phys: [id number] [place]    1. Left ventricular ejection fraction, by estimation, is 60 to 65%. The  left ventricle has normal function. The left ventricle has no regional  wall motion abnormalities. Left ventricular diastolic parameters are  consistent with Grade I diastolic  dysfunction (impaired relaxation).  2. Right ventricular systolic function is normal. The right ventricular  size is normal.  There is mildly elevated pulmonary artery systolic  pressure.  3. Left atrial size was mild to moderately dilated.  4. The mitral valve has been repaired/replaced. No evidence of mitral  valve regurgitation. No evidence of mitral stenosis. There is a 38 mm  prosthetic annuloplasty ring present in the mitral position. Procedure  Date: 08/03/2019. Echo findings are  consistent with normal structure and function of the mitral valve  prosthesis.  5. The aortic valve  is tricuspid. Aortic valve regurgitation is mild. No  aortic stenosis is present.  6. The inferior vena cava is normal in size with greater than 50%  respiratory variability, suggesting right atrial pressure of 3 mmHg.   FINDINGS  Left Ventricle: Left ventricular ejection fraction, by estimation, is 60  to 65%. The left ventricle has normal function. The left ventricle has no  regional wall motion abnormalities. The left ventricular internal cavity  size was normal in size. There is  no left ventricular hypertrophy. Left ventricular diastolic parameters  are consistent with Grade I diastolic dysfunction (impaired relaxation).   Right Ventricle: The right ventricular size is normal. No increase in  right ventricular wall thickness. Right ventricular systolic function is  normal. There is mildly elevated pulmonary artery systolic pressure. The  tricuspid regurgitant velocity is 2.62  m/s, and with an assumed right atrial pressure of 3 mmHg, the estimated  right ventricular systolic pressure is 96.7 mmHg.   Left Atrium: Left atrial size was mild to moderately dilated.   Right Atrium: Right atrial size was normal in size.   Pericardium: There is no evidence of pericardial effusion.   Mitral Valve: The mitral valve has been repaired/replaced. No evidence of  mitral valve regurgitation. There is a 38 mm prosthetic annuloplasty ring  present in the mitral position. Procedure Date: 08/03/2019. Echo findings  are consistent  with normal  structure and function of the mitral valve prosthesis. No evidence of  mitral valve stenosis. MV peak gradient, 3.0 mmHg. The mean mitral valve  gradient is 1.0 mmHg.   Tricuspid Valve: The tricuspid valve is normal in structure. Tricuspid  valve regurgitation is mild . No evidence of tricuspid stenosis.   Aortic Valve: The aortic valve is tricuspid. Aortic valve regurgitation is  mild. Aortic regurgitation PHT measures 591 msec. No aortic stenosis is  present.   Pulmonic Valve: The pulmonic valve was grossly normal. Pulmonic valve  regurgitation is trivial. No evidence of pulmonic stenosis.   Aorta: The aortic root, ascending aorta and aortic arch are all  structurally normal, with no evidence of dilitation or obstruction.   Pulmonary Artery: The pulmonary artery is not well seen.   Venous: The inferior vena cava is normal in size with greater than 50%  respiratory variability, suggesting right atrial pressure of 3 mmHg.   IAS/Shunts: No atrial level shunt detected by color flow Doppler.     LEFT VENTRICLE  PLAX 2D  LVIDd:     4.48 cm Diastology  LVIDs:     3.07 cm LV e' lateral:  9.68 cm/s  LV PW:     1.09 cm LV E/e' lateral: 5.8  LV IVS:    0.78 cm LV e' medial:  4.90 cm/s  LVOT diam:   2.30 cm LV E/e' medial: 11.5  LV SV:     85  LV SV Index:  47  LVOT Area:   4.15 cm     RIGHT VENTRICLE  RV S prime:   7.94 cm/s  TAPSE (M-mode): 1.4 cm   LEFT ATRIUM       Index    RIGHT ATRIUM      Index  LA diam:    4.85 cm 2.70 cm/m RA Area:   17.70 cm  LA Vol (A2C):  87.1 ml 48.41 ml/m RA Volume:  48.40 ml 26.90 ml/m  LA Vol (A4C):  46.6 ml 25.90 ml/m  LA Biplane Vol: 67.6 ml 37.57 ml/m  AORTIC VALVE  LVOT Vmax:  84.20  cm/s  LVOT Vmean: 56.200 cm/s  LVOT VTI:  0.205 m  AI PHT:   591 msec    AORTA  Ao Root diam: 3.35 cm  Ao Asc diam: 3.20 cm   MITRAL VALVE        TRICUSPID  VALVE  MV Area (PHT): 1.61 cm  TR Peak grad:  27.5 mmHg  MV Peak grad: 3.0 mmHg  TR Vmax:    262.00 cm/s  MV Mean grad: 1.0 mmHg  MV Vmax:    0.86 m/s  SHUNTS  MV Vmean:   47.7 cm/s  Systemic VTI: 0.20 m  MV Decel Time: 370 msec  Systemic Diam: 2.30 cm  MV E velocity: 56.30 cm/s  MV A velocity: 80.50 cm/s  MV E/A ratio: 0.70   Buford Dresser MD  Electronically signed by Buford Dresser MD  Signature Date/Time: 09/13/2019/7:27:57 PM        CT CHEST WITHOUT CONTRAST  TECHNIQUE: Multidetector CT imaging of the chest was performed following the standard protocol without IV contrast.  COMPARISON:  PET-CT 07/28/2019. Chest CT 07/20/2019.  FINDINGS: Cardiovascular: The heart size is normal. No substantial pericardial effusion. Atherosclerotic calcification is noted in the wall of the thoracic aorta. Patient is status post mitral valve replacement.  Mediastinum/Nodes: No mediastinal lymphadenopathy. No evidence for gross hilar lymphadenopathy although assessment is limited by the lack of intravenous contrast on today's study. The esophagus has normal imaging features. There is no axillary lymphadenopathy.  Lungs/Pleura: Centrilobular emphsyema noted. Scattered tiny ground-glass opacities are seen in the lung apices bilaterally.  11 mm subpleural lesion along the dome of the right hemidiaphragm has high attenuation compatible with calcification. This is new since 07/20/2019 in the rapid appearance is somewhat indeterminate. Mixed attenuation central left lower lobe lesion measures up to 3.9 cm on today's ex tiny satellite nodules are similar. No pleural effusion. Am, stable  Upper Abdomen: Stable small subcapsular cysts in the anterior liver 4.9 cm exophytic cyst upper pole right kidney is similar.  Musculoskeletal: No worrisome lytic or sclerotic osseous abnormality. Fractures of the anterior right third and fourth ribs are new  in the interval since 07/19/2019 but nonacute.  IMPRESSION: 1. No substantial interval change in the mixed attenuation central left lower lobe pulmonary lesion with tiny satellite nodules. Lesion noted to be hypermetabolic on recent PET-CT. 2. 11 mm subpleural lesion along the dome of the right hemidiaphragm has high attenuation approaching calcium and is new since 07/28/2019 but high density suggests benign etiology. This is not a typical appearance for aspirated contrast material. 3. Aortic Atherosclerosis (ICD10-I70.0) and Emphysema (ICD10-J43.9).   Electronically Signed   By: Misty Stanley M.D.   On: 10/02/2019 13:08   Impression:  Patient is doing well approximately 2 months status post minimally invasive mitral valve repair.  I have personally reviewed the patient's recent follow-up echocardiogram which demonstrates intact valve repair, no residual mitral regurgitation, and normal left ventricular systolic function.  Follow-up chest CT scan without contrast demonstrates stable radiographic appearance of the known groundglass opacity and soft tissue mass in the left lower lobe.  The radiologist made reference to a new density along the dome of the right hemidiaphragm which corresponds to Teflon felt pledgeted suture placed at the time of recent mitral valve repair.   Plan:  I have instructed the patient to decrease her dose of amiodarone to 100 mg (1/2 tablet) daily for the next month.  After that the patient may stop taking amiodarone.  We have otherwise not recommended  any other changes to her current medications.  I have encouraged the patient to continue to gradually increase her physical activity without any particular limitations at this time.  We will make arrangements for the patient to be seen in consultation by Dr. Roxan Hockey at some point within the next 4 to 6 weeks to discuss definitive management of the left lung mass.  All of her questions have been  addressed.      Valentina Gu. Roxy Manns, MD 10/09/2019 1:41 PM

## 2019-10-09 NOTE — Patient Instructions (Addendum)
Decrease your dose of Amiodarone to one half tablet (100 mg) daily until your current prescription runs out, then stop taking it altogether.  Continue all previous medications without any changes at this time  You may continue to gradually increase your physical activity as tolerated.  Refrain from any heavy lifting or strenuous use of your arms and shoulders until at least 8 weeks from the time of your surgery, and avoid activities that cause increased pain in your chest on the side of your surgical incision.  Otherwise you may continue to increase activities without any particular limitations.  Increase the intensity and duration of physical activity gradually.  You may return to driving an automobile as long as you are no longer requiring oral narcotic pain relievers during the daytime.  It would be wise to start driving only short distances during the daylight and gradually increase from there as you feel comfortable.  You are encouraged to enroll and participate in the outpatient cardiac rehab program beginning as soon as practical.  Schedule an appointment with Dr. Roxan Hockey within the next 4-6 weeks to discuss management of your left lung mass

## 2019-10-11 ENCOUNTER — Ambulatory Visit (HOSPITAL_COMMUNITY): Payer: PPO

## 2019-10-11 ENCOUNTER — Ambulatory Visit (INDEPENDENT_AMBULATORY_CARE_PROVIDER_SITE_OTHER): Payer: PPO | Admitting: *Deleted

## 2019-10-11 ENCOUNTER — Other Ambulatory Visit: Payer: Self-pay

## 2019-10-11 DIAGNOSIS — Z9889 Other specified postprocedural states: Secondary | ICD-10-CM | POA: Diagnosis not present

## 2019-10-11 DIAGNOSIS — I341 Nonrheumatic mitral (valve) prolapse: Secondary | ICD-10-CM

## 2019-10-11 DIAGNOSIS — Z5181 Encounter for therapeutic drug level monitoring: Secondary | ICD-10-CM

## 2019-10-11 LAB — POCT INR: INR: 1.7 — AB (ref 2.0–3.0)

## 2019-10-11 NOTE — Patient Instructions (Addendum)
Description   Today take 2 tablets then start taking 1 tablet daily except 1.5 tablets on Mondays, Wednesdays, and Fridays. Recheck INR in 1 week.  Coumadin Clinic [phone] Main [phone]

## 2019-10-13 ENCOUNTER — Telehealth: Payer: Self-pay

## 2019-10-13 ENCOUNTER — Ambulatory Visit (HOSPITAL_COMMUNITY): Payer: PPO

## 2019-10-13 NOTE — Telephone Encounter (Addendum)
Pt called office with c/o having heart palpitations earlier this morning. She states her HR was 74 when she woke up and rose to 94 after walking to her kitchen. She denies chest pain, shortness of breath, and dizziness and states she currently doesn't feel like she's having palpitations; however, she is concerned because Dr. Roxy Manns reduced her dose of amiodarone on 10/09/19. Reports that her current HR is 90 and pulse is regular. Advised her to f/u with her cardiologist and instructed to call [facility] if her heart rate becomes irregular or increases above 100 bpm. Also instructed to report chest pain, shortness of breath, and/or if she becomes dizzy or light-headed. She verbalizes understanding.

## 2019-10-16 ENCOUNTER — Ambulatory Visit (HOSPITAL_COMMUNITY): Payer: PPO

## 2019-10-18 ENCOUNTER — Ambulatory Visit (HOSPITAL_COMMUNITY): Payer: PPO

## 2019-10-19 ENCOUNTER — Ambulatory Visit (INDEPENDENT_AMBULATORY_CARE_PROVIDER_SITE_OTHER): Payer: PPO | Admitting: *Deleted

## 2019-10-19 ENCOUNTER — Encounter: Payer: Self-pay | Admitting: Thoracic Surgery (Cardiothoracic Vascular Surgery)

## 2019-10-19 ENCOUNTER — Other Ambulatory Visit: Payer: Self-pay

## 2019-10-19 DIAGNOSIS — Z9889 Other specified postprocedural states: Secondary | ICD-10-CM | POA: Diagnosis not present

## 2019-10-19 DIAGNOSIS — Z5181 Encounter for therapeutic drug level monitoring: Secondary | ICD-10-CM

## 2019-10-19 LAB — POCT INR: INR: 2.7 (ref 2.0–3.0)

## 2019-10-19 NOTE — Patient Instructions (Signed)
Description   Continue taking 1 tablet daily except 1.5 tablets on Mondays, Wednesdays, and Fridays. Recheck INR in 2 week.  Coumadin Clinic [phone] Main [phone]

## 2019-10-20 ENCOUNTER — Ambulatory Visit (HOSPITAL_COMMUNITY): Payer: PPO

## 2019-10-23 ENCOUNTER — Ambulatory Visit (HOSPITAL_COMMUNITY): Payer: PPO

## 2019-10-25 ENCOUNTER — Ambulatory Visit (HOSPITAL_COMMUNITY): Payer: PPO

## 2019-10-25 ENCOUNTER — Encounter: Payer: Self-pay | Admitting: Student

## 2019-10-25 DIAGNOSIS — Q383 Other congenital malformations of tongue: Secondary | ICD-10-CM | POA: Diagnosis not present

## 2019-10-25 DIAGNOSIS — R7301 Impaired fasting glucose: Secondary | ICD-10-CM | POA: Diagnosis not present

## 2019-10-25 DIAGNOSIS — N1831 Chronic kidney disease, stage 3a: Secondary | ICD-10-CM | POA: Diagnosis not present

## 2019-10-25 DIAGNOSIS — F39 Unspecified mood [affective] disorder: Secondary | ICD-10-CM | POA: Diagnosis not present

## 2019-10-25 DIAGNOSIS — R911 Solitary pulmonary nodule: Secondary | ICD-10-CM | POA: Diagnosis not present

## 2019-10-25 DIAGNOSIS — N281 Cyst of kidney, acquired: Secondary | ICD-10-CM | POA: Diagnosis not present

## 2019-10-25 DIAGNOSIS — I340 Nonrheumatic mitral (valve) insufficiency: Secondary | ICD-10-CM | POA: Diagnosis not present

## 2019-10-25 DIAGNOSIS — K219 Gastro-esophageal reflux disease without esophagitis: Secondary | ICD-10-CM | POA: Diagnosis not present

## 2019-10-25 DIAGNOSIS — F909 Attention-deficit hyperactivity disorder, unspecified type: Secondary | ICD-10-CM | POA: Diagnosis not present

## 2019-10-25 DIAGNOSIS — R002 Palpitations: Secondary | ICD-10-CM | POA: Diagnosis not present

## 2019-10-26 DIAGNOSIS — Q383 Other congenital malformations of tongue: Secondary | ICD-10-CM | POA: Diagnosis not present

## 2019-10-27 ENCOUNTER — Ambulatory Visit (HOSPITAL_COMMUNITY): Payer: PPO

## 2019-10-30 ENCOUNTER — Ambulatory Visit (HOSPITAL_COMMUNITY): Payer: PPO

## 2019-10-31 ENCOUNTER — Telehealth (HOSPITAL_COMMUNITY): Payer: Self-pay

## 2019-10-31 NOTE — Telephone Encounter (Signed)
Cardiac Rehab Medication Review by a Pharmacist  Does the patient  feel that his/her medications are working for him/her?  Yes   Has the patient been experiencing any side effects to the medications prescribed?  No   Does the patient measure his/her own blood pressure or blood glucose at home?  Yes occasionally is usually normal/low. Patient reports ~116/64.   Does the patient have any problems obtaining medications due to transportation or finances?   No   Understanding of regimen: good Understanding of indications: good Potential of compliance: good    Pharmacist comments: N/a  Cristela Felt, PharmD PGY1 Pharmacy Resident  10/31/2019 3:55 PM

## 2019-11-01 ENCOUNTER — Ambulatory Visit (HOSPITAL_COMMUNITY): Payer: PPO

## 2019-11-02 ENCOUNTER — Ambulatory Visit (INDEPENDENT_AMBULATORY_CARE_PROVIDER_SITE_OTHER): Payer: PPO | Admitting: *Deleted

## 2019-11-02 ENCOUNTER — Other Ambulatory Visit: Payer: Self-pay

## 2019-11-02 DIAGNOSIS — Z9889 Other specified postprocedural states: Secondary | ICD-10-CM | POA: Diagnosis not present

## 2019-11-02 DIAGNOSIS — Z5181 Encounter for therapeutic drug level monitoring: Secondary | ICD-10-CM

## 2019-11-02 LAB — POCT INR: INR: 2 (ref 2.0–3.0)

## 2019-11-02 NOTE — Patient Instructions (Signed)
Description   Continue taking 1 tablet daily except 1.5 tablets on Mondays, Wednesdays, and Fridays. Recheck INR in 2 week.  Coumadin Clinic [phone] Main [phone]

## 2019-11-03 ENCOUNTER — Encounter: Payer: Self-pay | Admitting: Internal Medicine

## 2019-11-03 ENCOUNTER — Ambulatory Visit (HOSPITAL_COMMUNITY): Payer: PPO

## 2019-11-06 ENCOUNTER — Ambulatory Visit (HOSPITAL_COMMUNITY): Payer: PPO

## 2019-11-07 ENCOUNTER — Institutional Professional Consult (permissible substitution): Payer: PPO | Admitting: Thoracic Surgery (Cardiothoracic Vascular Surgery)

## 2019-11-07 ENCOUNTER — Telehealth: Payer: Self-pay | Admitting: Cardiology

## 2019-11-07 ENCOUNTER — Encounter: Payer: Self-pay | Admitting: Thoracic Surgery (Cardiothoracic Vascular Surgery)

## 2019-11-07 ENCOUNTER — Encounter (HOSPITAL_COMMUNITY)
Admission: RE | Admit: 2019-11-07 | Discharge: 2019-11-07 | Disposition: A | Discharge status: Home / Self Care | Payer: PPO | Source: Ambulatory Visit | Attending: Cardiology | Admitting: Cardiology

## 2019-11-07 ENCOUNTER — Other Ambulatory Visit: Payer: Self-pay

## 2019-11-07 ENCOUNTER — Encounter (HOSPITAL_COMMUNITY): Payer: Self-pay

## 2019-11-07 VITALS — BP 124/70 | HR 66 | Temp 98.4°F | Ht 62.25 in | Wt 174.8 lb

## 2019-11-07 VITALS — BP 125/75 | HR 65 | Temp 97.7°F | Ht 62.25 in | Wt 174.0 lb

## 2019-11-07 DIAGNOSIS — D381 Neoplasm of uncertain behavior of trachea, bronchus and lung: Secondary | ICD-10-CM

## 2019-11-07 DIAGNOSIS — Z9889 Other specified postprocedural states: Secondary | ICD-10-CM | POA: Diagnosis not present

## 2019-11-07 DIAGNOSIS — Z8774 Personal history of (corrected) congenital malformations of heart and circulatory system: Secondary | ICD-10-CM

## 2019-11-07 NOTE — Progress Notes (Signed)
PCP is Anne Hogan, Anne Ready, MD Referring Provider is Anne Alberts, MD  Chief Complaint  Patient presents with  . Lung Mass    LLLobe.Marland KitchenMarland KitchenSurgical eval, Chest CT-super D 10/02/19, PET Scan 07/28/19    HPI: [name] is sent for consultation regarding a left lower lobe lung nodule.  Anne Hogan is a 74 year old woman with a history of mitral valve prolapse, severe mitral regurgitation, congestive heart failure, patent foramen ovale, arthritis, asthma, sleep apnea, reflux, claustrophobia, depression, and palpitations.  She presented with worsening exertional shortness of breath.  She had been started on Singulair and Breo Ellipta prior to being found to have worsening heart failure.  She was referred to Dr. Roxy Hogan and scheduled for mitral valve repair.  As part of her preoperative work-up she has had a CT angiogram which showed a left lower lobe lung nodule.  This was a mixed density nodule.  A PET/CT was done which showed the nodule was hypermetabolic with SUV of 0.86.  There is no evidence of regional or distant metastases.   She underwent a minimally invasive mitral valve repair and closure of a patent foramen ovale on 08/03/2019.  Her postoperative course was uneventful.  She is feeling well.  Her exercise tolerance is much better than it was prior to surgery.  She saw Dr. Roxy Hogan on 10/09/2019 and is now referred back to address the pulmonary nodule.  He has a history of smoking but quit in 1987.  She currently is having far less shortness of breath than she had prior to her mitral valve repair.  She denies any unusual headaches or visual changes.  She did lose a little weight around the time of surgery, but her appetite is good currently. Zubrod Score: At the time of surgery this patient's most appropriate activity status/level should be described as: []     0    Normal activity, no symptoms [x]     1    Restricted in physical strenuous activity but ambulatory, able to do out light work []     2     Ambulatory and capable of self care, unable to do work activities, up and about >50 % of waking hours                              []     3    Only limited self care, in bed greater than 50% of waking hours []     4    Completely disabled, no self care, confined to bed or chair []     5    Moribund  Past Medical History:  Diagnosis Date  . ADD (attention deficit disorder)   . Arthritis    "right knee" (04/19/2013)  . Asthma 04/03/2019  . CHF (congestive heart failure) ([place])   . Chronic diastolic congestive heart failure ([place])   . Claustrophobia   . Depression   . Dyspnea   . Dysrhythmia   . Fracture of right tibial plateau 05/2016  . GERD (gastroesophageal reflux disease)   . Heart murmur   . Incidental pulmonary nodule, greater than or equal to 35mm 06/06/2019   Needs f/u CT in 3 months  . Migraine    "bad when I was in 5th or 6th grade; I can usually ward them off by resting my head now" (04/19/2013)  . MVP (mitral valve prolapse)   . S/P minimally-invasive mitral valve repair 08/03/2019   Complex valvuloplasty including artificial Gore-tex neochords x12  and Sorin Memo 4D ring annuloplasty, size 38 mm  . S/P patent foramen ovale closure 08/03/2019  . Sleep apnea    has not used mouth guard in 5 years no cpap used   . Squamous carcinoma ?1999   "upper left groin" (04/19/2013)    Past Surgical History:  Procedure Laterality Date  . CARDIAC CATHETERIZATION  05/26/2019  . CHOLECYSTECTOMY  04/18/2013  . CHOLECYSTECTOMY N/A 04/18/2013   Procedure: LAPAROSCOPIC CHOLECYSTECTOMY WITH INTRAOPERATIVE CHOLANGIOGRAM;  Surgeon: Anne Ok, MD;  Location: [place];  Service: General;  Laterality: N/A;  . COLONOSCOPY W/ POLYPECTOMY    . EYE SURGERY Bilateral FALL  2016   IOC FOR CATARACTS  . JOINT REPLACEMENT    . KNEE ARTHROSCOPY Right 12/2007   "meniscus repair" (04/19/2013)  . MITRAL VALVE REPAIR Right 08/03/2019   Procedure: MINIMALLY INVASIVE MITRAL VALVE REPAIR (MVR) using Memo 4D 38 MM  Mitral Valve.;  Surgeon: Anne Alberts, MD;  Location: [place];  Service: Open Heart Surgery;  Laterality: Right;  . REPAIR OF PATENT FORAMEN OVALE N/A 08/03/2019   Procedure: CLOSURE OF PATENT FORAMEN OVALE;  Surgeon: Anne Alberts, MD;  Location: [place] [facility];  Service: Open Heart Surgery;  Laterality: N/A;  . RETINAL TEAR REPAIR CRYOTHERAPY Left ~ 1997  . RIGHT/LEFT HEART CATH AND CORONARY ANGIOGRAPHY N/A 05/26/2019   Procedure: RIGHT/LEFT HEART CATH AND CORONARY ANGIOGRAPHY;  Surgeon: Anne Booze, MD;  Location: [place] [facility];  Service: Cardiovascular;  Laterality: N/A;  . TEE WITHOUT CARDIOVERSION N/A 05/26/2019   Procedure: TRANSESOPHAGEAL ECHOCARDIOGRAM (TEE);  Surgeon: Anne Hector, MD;  Location: [facility];  Service: Cardiovascular;  Laterality: N/A;  . TEE WITHOUT CARDIOVERSION N/A 08/03/2019   Procedure: TRANSESOPHAGEAL ECHOCARDIOGRAM (TEE);  Surgeon: Anne Alberts, MD;  Location: [place];  Service: Open Heart Surgery;  Laterality: N/A;  . TONSILLECTOMY  ~ 1952  . TOTAL KNEE ARTHROPLASTY Right 09/21/2016   Procedure: RIGHT TOTAL KNEE ARTHROPLASTY;  Surgeon: Anne Arabian, MD;  Location: [facility];  Service: Orthopedics;  Laterality: Right;  requests 71mins with abductor block  . VARICOSE VEIN SURGERY Right 2000's  . VASCULAR SURGERY    . WRIST SURGERY Right 1975   "had a knot taken off" (04/19/2013)    Family History  Problem Relation Age of Onset  . Tremor Mother   . Osteoporosis Mother   . Sarcoidosis Mother   . Hypertension Mother   . Congestive Heart Failure Father   . Heart attack Father   . Diabetes Mellitus II Father   . Diabetes Mellitus II Brother     Social History Social History   Tobacco Use  . Smoking status: Former Smoker    Packs/day: 1.50    Years: 20.00    Pack years: 30.00    Types: Cigarettes    Quit date: 11/30/1986    Years since quitting: 32.9  . Smokeless tobacco: Never Used  Substance Use Topics  . Alcohol use: Yes    Comment:  RARE  . Drug use: No    Current Outpatient Medications  Medication Sig Dispense Refill  . acetaminophen (TYLENOL) 500 MG tablet Take 500-1,000 mg by mouth every 6 (six) hours as needed for moderate pain (depends on pain if takes 1-2 tablets).    [name] albuterol (PROVENTIL) (2.5 MG/3ML) 0.083% nebulizer solution Take 2.5 mg by nebulization every 4 (four) hours.     . Alum Hydroxide-Mag Carbonate (GAVISCON EXTRA STRENGTH) 160-105 MG CHEW Chew 1 tablet by mouth as needed (indigestion).  2-3 times weekly at bedtime prn    . amiodarone (PACERONE) 200 MG tablet Take 0.5 tablets (100 mg total) by mouth daily. 90 tablet 3  . aspirin EC 81 MG EC tablet Take 1 tablet (81 mg total) by mouth daily.    . Biotin 5000 MCG TABS Take 5,000 mcg by mouth daily.    [name] buPROPion (WELLBUTRIN XL) 300 MG 24 hr tablet Take 300 mg by mouth daily.    . Ca Phosphate-Cholecalciferol (CALCIUM/VITAMIN D3 GUMMIES PO) Take 2 each by mouth 2 (two) times daily. Alive Woman's over 33    . docusate sodium (COLACE) 100 MG capsule Take 100 mg by mouth daily as needed for mild constipation.    . fluticasone furoate-vilanterol (BREO ELLIPTA) 100-25 MCG/INH AEPB USE 1 PUFF DAILY AS DIRECTED 60 each 3  . metoprolol tartrate (LOPRESSOR) 25 MG tablet TAKE (1/2) TABLET TWICE DAILY. 90 tablet 3  . montelukast (SINGULAIR) 10 MG tablet Take 10 mg by mouth at bedtime.     . Multiple Vitamins-Minerals (MULTIVITAMIN WITH MINERALS) tablet Take 1 tablet by mouth daily. Master's complete    . Vitamin D, Ergocalciferol, (DRISDOL) 1.25 MG (50000 UT) CAPS capsule Take 50,000 Units by mouth 2 (two) times a week. Sun and Thurs    . warfarin (COUMADIN) 5 MG tablet Take as directed by the coumadin clinic (Patient taking differently: 5 mg. 5mg  daily except for 7.5mg  MWF) 40 tablet 2  . cyclobenzaprine (FLEXERIL) 5 MG tablet Take 10 mg by mouth in the morning.     No current facility-administered medications for this visit.    Allergies  Allergen Reactions   . Penicillins Rash    Has patient had a PCN reaction causing immediate rash, facial/tongue/throat swelling, SOB or lightheadedness with hypotension: No Has patient had a PCN reaction causing severe rash involving mucus membranes or skin necrosis: no Has patient had a PCN reaction that required hospitalization: No Has patient had a PCN reaction occurring within the last 10 years: no If all of the above answers are "NO", then may proceed with Cephalosporin use.    . Codeine Other (See Comments)    Possible sensitivity, patient not sure  . Milk-Related Compounds Nausea And Vomiting and Other (See Comments)    Can have ice cream, pt just doesn't want to drink milk  . Morphine And Related     "strange sensation across midsection and chest"    Review of Systems  Constitutional: Negative for activity change and unexpected weight change (Some weight loss postop).  HENT: Negative for trouble swallowing and voice change.   Respiratory: Positive for shortness of breath (Improved post surgery). Negative for wheezing.   Cardiovascular: Positive for palpitations. Negative for chest pain.  Genitourinary: Negative for difficulty urinating and dysuria.  Musculoskeletal: Positive for arthralgias. Negative for myalgias.  Neurological: Positive for headaches. Negative for dizziness and weakness.  Hematological: Bruises/bleeds easily (On Coumadin).  All other systems reviewed and are negative.   BP 125/75 (BP Location: Left Arm, Patient Position: Sitting, Cuff Size: Normal)   Pulse 65   Temp 97.7 F (36.5 C)   Ht 5' 2.25" (1.581 m)   Wt 174 lb (78.9 kg)   SpO2 (!) 87% Comment: RA  BMI 31.57 kg/m  Physical Exam Vitals reviewed.  Constitutional:      General: She is not in acute distress.    Appearance: She is obese.  HENT:     Head: Normocephalic and atraumatic.  Eyes:     General: No  scleral icterus. Neck:     Vascular: No carotid bruit.  Cardiovascular:     Rate and Rhythm: Normal rate  and regular rhythm.     Heart sounds: Normal heart sounds. No murmur. No friction rub. No gallop.   Pulmonary:     Effort: Pulmonary effort is normal. No respiratory distress.     Breath sounds: Normal breath sounds. No wheezing or rales.  Abdominal:     General: There is no distension.     Palpations: Abdomen is soft.     Tenderness: There is no abdominal tenderness.  Musculoskeletal:        General: No swelling.     Cervical back: Neck supple.  Lymphadenopathy:     Cervical: No cervical adenopathy.  Skin:    General: Skin is warm and dry.  Neurological:     General: No focal deficit present.     Mental Status: She is alert and oriented to person, place, and time.     Cranial Nerves: No cranial nerve deficit.     Motor: No weakness.    Diagnostic Tests: CT CHEST WITHOUT CONTRAST  TECHNIQUE: Multidetector CT imaging of the chest was performed following the standard protocol without IV contrast.  COMPARISON:  PET-CT 07/28/2019. Chest CT 07/20/2019.  FINDINGS: Cardiovascular: The heart size is normal. No substantial pericardial effusion. Atherosclerotic calcification is noted in the wall of the thoracic aorta. Patient is status post mitral valve replacement.  Mediastinum/Nodes: No mediastinal lymphadenopathy. No evidence for gross hilar lymphadenopathy although assessment is limited by the lack of intravenous contrast on today's study. The esophagus has normal imaging features. There is no axillary lymphadenopathy.  Lungs/Pleura: Centrilobular emphsyema noted. Scattered tiny ground-glass opacities are seen in the lung apices bilaterally.  11 mm subpleural lesion along the dome of the right hemidiaphragm has high attenuation compatible with calcification. This is new since 07/20/2019 in the rapid appearance is somewhat indeterminate. Mixed attenuation central left lower lobe lesion measures up to 3.9 cm on today's ex tiny satellite nodules are similar. No  pleural effusion. Am, stable  Upper Abdomen: Stable small subcapsular cysts in the anterior liver 4.9 cm exophytic cyst upper pole right kidney is similar.  Musculoskeletal: No worrisome lytic or sclerotic osseous abnormality. Fractures of the anterior right third and fourth ribs are new in the interval since 07/19/2019 but nonacute.  IMPRESSION: 1. No substantial interval change in the mixed attenuation central left lower lobe pulmonary lesion with tiny satellite nodules. Lesion noted to be hypermetabolic on recent PET-CT. 2. 11 mm subpleural lesion along the dome of the right hemidiaphragm has high attenuation approaching calcium and is new since 07/28/2019 but high density suggests benign etiology. This is not a typical appearance for aspirated contrast material. 3. Aortic Atherosclerosis (ICD10-I70.0) and Emphysema (ICD10-J43.9).   Electronically Signed   By: Misty Stanley M.D.   On: 10/02/2019 13:08 NUCLEAR MEDICINE PET SKULL BASE TO THIGH  TECHNIQUE: 8.5 mCi F-18 FDG was injected intravenously. Full-ring PET imaging was performed from the skull base to thigh after the radiotracer. CT data was obtained and used for attenuation correction and anatomic localization.  Fasting blood glucose: 103 mg/dl  COMPARISON:  07/20/2019  FINDINGS: Mediastinal blood pool activity: SUV max 2.8  Liver activity: SUV max NA  NECK: No hypermetabolic lymph nodes in the neck.  Incidental CT findings: none  CHEST: The persistent left lower lobe lung nodule measures 2.5 x 2.0 cm and has an SUV max of 3.47. This is not significantly changed  in size from 06/06/2019 but appears progressively more solid.  Also in the left lower lobe is a 3 mm, too small to characterize lung nodule, image 43/8.  Incidental CT findings: Centrilobular emphysema. Mild aortic atherosclerosis.  ABDOMEN/PELVIS: No abnormal hypermetabolic activity within the liver, pancreas, adrenal glands, or  spleen. No hypermetabolic lymph nodes in the abdomen or pelvis.  Incidental CT findings: Chronic cysts within right hepatic lobe, similar to previous exam from 04/04/2013. Aortic atherosclerosis identified. 4.8 cm cyst arises from upper pole of the right kidney. Small indeterminate lesion arising from posterior cortex of left kidney measures 7 mm. This is too small to characterize.  SKELETON: No focal hypermetabolic activity to suggest skeletal metastasis.  Incidental CT findings: none  IMPRESSION: Increased FDG uptake associated with left lower lobe lung nodule has an SUV max of 3.47. Findings are suspicious for primary bronchogenic carcinoma, possibly adenocarcinoma. Correlation with tissue sampling advised.  No FDG avid thoracic lymph nodes or evidence of distant metastatic disease.  Aortic Atherosclerosis (ICD10-I70.0) and Emphysema (ICD10-J43.9).   Electronically Signed   By: Kerby Moors M.D.   On: 07/28/2019 10:04 I personally reviewed the CT images and also reviewed the PET/CT images again.  I concur with the findings noted above.  Impression: Anne Hogan is a 74 year old woman with a past medical history significant for mitral valve prolapse, severe mitral regurgitation, congestive heart failure, patent foramen ovale, arthritis, asthma, sleep apnea, reflux, claustrophobia, depression, and palpitations.  She has remote history of tobacco abuse, but quit in 1987.  She was incidentally found to have a mixed solid/subsolid nodule in the left lower lobe on a CT as part of her work-up prior to her mitral valve repair.  The nodule was hypermetabolic on PET/CT.  On follow-up CT there is been little change in the nodule.  The differential diagnosis includes infectious and inflammatory nodules, however, by far the most likely scenario is that this is a low-grade adenocarcinoma.  I discussed the differential diagnosis as well as options for diagnosis and treatment with Mrs.  Viles and her friend.  Options include continued radiographic follow-up,  bronchoscopy for sampling, and surgical resection.  She understands that possible treatments include surgery and radiation.  We reviewed the relative advantages and disadvantages to each of those approaches.  Given the extremely high index of suspicion, I would recommend proceeding directly to surgical resection.  I described the proposed operation to her.  We would plan to do a robotic left lower lobectomy.  I informed her of the need for general anesthesia, the incisions to be used, the use of a drainage tube postoperatively, the expected hospital stay, and the overall recovery.  I informed her of the indications, risks, benefits, and alternatives.  She understands the risks include, but are not limited to death, MI, DVT, PE, bleeding, possible need for transfusion, infection, prolonged air leak, cardiac arrhythmias, as well as the possibility of other unforeseeable complications.  She understands the degree of pain is variable and unpredictable.  She has been on Coumadin since her surgery.  Their prescription runs out tomorrow.  I do not think there is any reason for her to be on Coumadin beyond 3 months but will check with Dr. Roxy Hogan on that.  Her prescriptions for Singulair and Breo Ellipta also run out.  She has refills on those but I recommended she not fill them and then if she has issues with wheezing she could restart those medications.  She understands accepts the risk of surgery and  wishes to proceed.  Plan: Robotic left lower lobectomy on Thursday, 11/23/2019  I spent 45 minutes today in review of records, review of images, and an examination and consultation with Mrs. Abundio Miu, MD [facility] [phone]

## 2019-11-07 NOTE — Telephone Encounter (Signed)
Returned a call to the pt and there was no answer, left a message for her to call back to our Clinic at [phone].

## 2019-11-07 NOTE — Telephone Encounter (Signed)
Pt returned my call and she states her appt is today with her Surgeon. Advised pt that if the Surgeon states she needs to stop her Warfarin then she can update [place] tomorrow since appt is later today.  Also, she is aware that the Physician will be the person to stop her Warfarin. Discussed refilling Warfarin after appt and she is aware that her current bottle should have 2 refills and she can call it in at that time and if the med is not continued than she does not have to fill it and she will update [place] tomorrow if warfarin is stopped.

## 2019-11-07 NOTE — Telephone Encounter (Signed)
Pt called and wanted to know if she still needs to take her Coumadin. She has to go to the thoracic surgeon tomorrow and needs to know if she needs to stop taking it.  She just needs to know because she needs to know if she needs to refill her coumadin or not. Please advise

## 2019-11-07 NOTE — Progress Notes (Signed)
Cardiac Individual Treatment Plan  Patient Details  Name: Anne Hogan MRN: [id number] Date of Birth: 1946-06-16 Referring Provider:     CARDIAC REHAB [place] from 11/07/2019 in [place]  Referring Provider  Constance Haw, MD      Initial Encounter Date:    CARDIAC REHAB PHASE II ORIENTATION from 11/07/2019 in [facility]  Date  11/07/19      Visit Diagnosis: S/P Minimally Invasive Mitral Valve Repair 08/03/19  Patient's Home Medications on Admission:  Current Outpatient Medications:  .  acetaminophen (TYLENOL) 500 MG tablet, Take 500-1,000 mg by mouth every 6 (six) hours as needed for moderate pain (depends on pain if takes 1-2 tablets)., Disp: , Rfl:  .  albuterol (PROVENTIL) (2.5 MG/3ML) 0.083% nebulizer solution, Take 2.5 mg by nebulization every 4 (four) hours as needed for shortness of breath. , Disp: , Rfl:  .  Alum Hydroxide-Mag Carbonate (GAVISCON EXTRA STRENGTH) 160-105 MG CHEW, Chew 1 tablet by mouth as needed (indigestion). 2-3 times weekly at bedtime prn, Disp: , Rfl:  .  amiodarone (PACERONE) 200 MG tablet, Take 0.5 tablets (100 mg total) by mouth daily., Disp: 90 tablet, Rfl: 3 .  aspirin EC 81 MG EC tablet, Take 1 tablet (81 mg total) by mouth daily., Disp:  , Rfl:  .  Biotin 5000 MCG TABS, Take 5,000 mcg by mouth daily., Disp: , Rfl:  .  buPROPion (WELLBUTRIN XL) 300 MG 24 hr tablet, Take 300 mg by mouth daily., Disp: , Rfl:  .  Ca Phosphate-Cholecalciferol (CALCIUM/VITAMIN D3 GUMMIES PO), Take 2 each by mouth 2 (two) times daily. Alive Woman's over 39, Disp: , Rfl:  .  Cyanocobalamin (VITAMIN B-12) 5000 MCG TBDP, Take 5,000 mcg by mouth daily., Disp: , Rfl:  .  cyclobenzaprine (FLEXERIL) 5 MG tablet, Take 10 mg by mouth in the morning., Disp: , Rfl:  .  docusate sodium (COLACE) 100 MG capsule, Take 100 mg by mouth daily as needed for mild constipation., Disp: , Rfl:  .  fluticasone  furoate-vilanterol (BREO ELLIPTA) 100-25 MCG/INH AEPB, USE 1 PUFF DAILY AS DIRECTED, Disp: 60 each, Rfl: 3 .  metoprolol tartrate (LOPRESSOR) 25 MG tablet, TAKE (1/2) TABLET TWICE DAILY., Disp: 90 tablet, Rfl: 3 .  montelukast (SINGULAIR) 10 MG tablet, Take 10 mg by mouth at bedtime. , Disp: , Rfl:  .  Multiple Vitamins-Minerals (MULTIVITAMIN WITH MINERALS) tablet, Take 1 tablet by mouth daily. Master's complete, Disp: , Rfl:  .  Vitamin D, Ergocalciferol, (DRISDOL) 1.25 MG (50000 UT) CAPS capsule, Take 50,000 Units by mouth 2 (two) times a week. Nancy Fetter and Thurs, Disp: , Rfl:  .  warfarin (COUMADIN) 5 MG tablet, Take as directed by the coumadin clinic (Patient taking differently: 5 mg. 5mg  daily except for 7.5mg  MWF), Disp: 40 tablet, Rfl: 2  Past Medical History: Past Medical History:  Diagnosis Date  . ADD (attention deficit disorder)   . Arthritis    "right knee" (04/19/2013)  . Asthma 04/03/2019  . CHF (congestive heart failure) ([place])   . Chronic diastolic congestive heart failure ([place])   . Claustrophobia   . Depression   . Dyspnea   . Dysrhythmia   . Fracture of right tibial plateau 05/2016  . GERD (gastroesophageal reflux disease)   . Heart murmur   . Incidental pulmonary nodule, greater than or equal to 44mm 06/06/2019   Needs f/u CT in 3 months  . Migraine    "  bad when I was in 5th or 6th grade; I can usually ward them off by resting my head now" (04/19/2013)  . MVP (mitral valve prolapse)   . S/P minimally-invasive mitral valve repair 08/03/2019   Complex valvuloplasty including artificial Gore-tex neochords x12 and Sorin Memo 4D ring annuloplasty, size 38 mm  . S/P patent foramen ovale closure 08/03/2019  . Sleep apnea    has not used mouth guard in 5 years no cpap used   . Squamous carcinoma ?1999   "upper left groin" (04/19/2013)    Tobacco Use: Social History   Tobacco Use  Smoking Status Former Smoker  . Packs/day: 1.50  . Years: 20.00  . Pack years: 30.00  . Types:  Cigarettes  . Quit date: 11/30/1986  . Years since quitting: 32.9  Smokeless Tobacco Never Used    Labs: Recent Chemical engineer    Labs for ITP Cardiac and Pulmonary Rehab Latest Ref Rng & Units 08/03/2019 08/03/2019 08/03/2019 08/03/2019 08/05/2019   Hemoglobin A1c 4.8 - 5.6 % - - - - -   PHART 7.350 - 7.450 - 7.356 7.288(L) 7.337(L) -   PCO2ART 32.0 - 48.0 mmHg - 39.2 45.5 42.8 -   HCO3 20.0 - 28.0 mmol/L - 21.9 22.2 23.3 27.6   TCO2 22 - 32 mmol/L 23 23 24 25 29    ACIDBASEDEF 0.0 - 2.0 mmol/L - 3.0(H) 5.0(H) 3.0(H) -   O2SAT % - 100.0 97.0 96.0 43.0      Capillary Blood Glucose: Lab Results  Component Value Date   GLUCAP 101 (H) 08/07/2019   GLUCAP 95 08/06/2019   GLUCAP 97 08/06/2019   GLUCAP 99 08/05/2019   GLUCAP 89 08/05/2019     Exercise Target Goals: Exercise Program Goal: Individual exercise prescription set using results from initial 6 min walk test and THRR while considering  patient's activity barriers and safety.   Exercise Prescription Goal: Starting with aerobic activity 30 plus minutes a day, 3 days per week for initial exercise prescription. Provide home exercise prescription and guidelines that participant acknowledges understanding prior to discharge.  Activity Barriers & Risk Stratification: Activity Barriers & Cardiac Risk Stratification - 11/07/19 1110      Activity Barriers & Cardiac Risk Stratification   Activity Barriers  Right Knee Replacement    Cardiac Risk Stratification  Moderate       6 Minute Walk: 6 Minute Walk    Row Name 11/07/19 1123         6 Minute Walk   Phase  Initial     Distance  1461 feet     Walk Time  6 minutes     # of Rest Breaks  0     MPH  2.77     METS  2.45     RPE  13     Perceived Dyspnea   0     VO2 Peak  8.58     Symptoms  No     Resting HR  66 bpm     Resting BP  124/70     Resting Oxygen Saturation   98 %     Exercise Oxygen Saturation  during 6 min walk  98 %     Max Ex. HR  76 bpm     Max  Ex. BP  126/72     2 Minute Post BP  120/68        Oxygen Initial Assessment:   Oxygen Re-Evaluation:   Oxygen Discharge (Final Oxygen  Re-Evaluation):   Initial Exercise Prescription: Initial Exercise Prescription - 11/07/19 1200      Date of Initial Exercise RX and Referring Provider   Date  11/07/19    Referring Provider  Constance Haw, MD    Expected Discharge Date  01/05/20      Recumbant Bike   Level  1.5    Watts  10    Minutes  15    METs  2.39      NuStep   Level  2    SPM  85    Minutes  15    METs  2.4      Prescription Details   Frequency (times per week)  3    Duration  Progress to 30 minutes of continuous aerobic without signs/symptoms of physical distress      Intensity   THRR 40-80% of Max Heartrate  59-118    Ratings of Perceived Exertion  11-13    Perceived Dyspnea  0-4      Progression   Progression  Continue to progress workloads to maintain intensity without signs/symptoms of physical distress.      Resistance Training   Training Prescription  Yes    Weight  3lbs    Reps  10-15       Perform Capillary Blood Glucose checks as needed.  Exercise Prescription Changes:   Exercise Comments:   Exercise Goals and Review: Exercise Goals    Row Name 11/07/19 1114             Exercise Goals   Increase Physical Activity  Yes       Intervention  Provide advice, education, support and counseling about physical activity/exercise needs.;Develop an individualized exercise prescription for aerobic and resistive training based on initial evaluation findings, risk stratification, comorbidities and participant's personal goals.       Expected Outcomes  Short Term: Attend rehab on a regular basis to increase amount of physical activity.;Long Term: Exercising regularly at least 3-5 days a week.;Long Term: Add in home exercise to make exercise part of routine and to increase amount of physical activity.       Increase Strength and Stamina   Yes       Intervention  Provide advice, education, support and counseling about physical activity/exercise needs.;Develop an individualized exercise prescription for aerobic and resistive training based on initial evaluation findings, risk stratification, comorbidities and participant's personal goals.       Expected Outcomes  Short Term: Increase workloads from initial exercise prescription for resistance, speed, and METs.;Short Term: Perform resistance training exercises routinely during rehab and add in resistance training at home;Long Term: Improve cardiorespiratory fitness, muscular endurance and strength as measured by increased METs and functional capacity (6MWT)       Able to understand and use rate of perceived exertion (RPE) scale  Yes       Intervention  Provide education and explanation on how to use RPE scale       Expected Outcomes  Short Term: Able to use RPE daily in rehab to express subjective intensity level;Long Term:  Able to use RPE to guide intensity level when exercising independently       Knowledge and understanding of Target Heart Rate Range (THRR)  Yes       Intervention  Provide education and explanation of THRR including how the numbers were predicted and where they are located for reference       Expected Outcomes  Short Term: Able to state/look  up THRR;Long Term: Able to use THRR to govern intensity when exercising independently;Short Term: Able to use daily as guideline for intensity in rehab       Able to check pulse independently  Yes       Intervention  Provide education and demonstration on how to check pulse in carotid and radial arteries.;Review the importance of being able to check your own pulse for safety during independent exercise       Expected Outcomes  Short Term: Able to explain why pulse checking is important during independent exercise;Long Term: Able to check pulse independently and accurately       Understanding of Exercise Prescription  Yes        Intervention  Provide education, explanation, and written materials on patient's individual exercise prescription       Expected Outcomes  Short Term: Able to explain program exercise prescription;Long Term: Able to explain home exercise prescription to exercise independently          Exercise Goals Re-Evaluation :    Discharge Exercise Prescription (Final Exercise Prescription Changes):   Nutrition:  Target Goals: Understanding of nutrition guidelines, daily intake of sodium 1500mg , cholesterol 200mg , calories 30% from fat and 7% or less from saturated fats, daily to have 5 or more servings of fruits and vegetables.  Biometrics: Pre Biometrics - 11/07/19 1109      Pre Biometrics   Height  5' 2.25" (1.581 m)    Weight  79.3 kg    Waist Circumference  36.75 inches    Hip Circumference  44 inches    Waist to Hip Ratio  0.84 %    BMI (Calculated)  31.73    Triceps Skinfold  39 mm    % Body Fat  44.2 %    Grip Strength  32.5 kg    Flexibility  14 in    Single Leg Stand  7.5 seconds        Nutrition Therapy Plan and Nutrition Goals:   Nutrition Assessments:   Nutrition Goals Re-Evaluation:   Nutrition Goals Discharge (Final Nutrition Goals Re-Evaluation):   Psychosocial: Target Goals: Acknowledge presence or absence of significant depression and/or stress, maximize coping skills, provide positive support system. Participant is able to verbalize types and ability to use techniques and skills needed for reducing stress and depression.  Initial Review & Psychosocial Screening: Initial Psych Review & Screening - 11/07/19 1213      Initial Review   Current issues with  History of Depression;Current Stress Concerns    Source of Stress Concerns  Chronic Illness    Comments  Petron says her depression is currently controlled      Family Dynamics   Good Support System?  Yes   Anne Hogan lives lone with her dog and has a wonderful network of friends for support     Barriers    Psychosocial barriers to participate in program  The patient should benefit from training in stress management and relaxation.      Screening Interventions   Interventions  Encouraged to exercise    Expected Outcomes  Long Term Goal: Stressors or current issues are controlled or eliminated.       Quality of Life Scores: Quality of Life - 11/07/19 1225      Quality of Life   Select  Quality of Life      Quality of Life Scores   Health/Function Pre  28 %    Socioeconomic Pre  30 %    Psych/Spiritual  Pre  30 %    Family Pre  30 %    GLOBAL Pre  29.08 %      Scores of 19 and below usually indicate a poorer quality of life in these areas.  A difference of  2-3 points is a clinically meaningful difference.  A difference of 2-3 points in the total score of the Quality of Life Index has been associated with significant improvement in overall quality of life, self-image, physical symptoms, and general health in studies assessing change in quality of life.  PHQ-9: Recent Review Flowsheet Data    Depression screen [facility] 2/9 11/07/2019   Decreased Interest 0   Down, Depressed, Hopeless 0   PHQ - 2 Score 0     Interpretation of Total Score  Total Score Depression Severity:  1-4 = Minimal depression, 5-9 = Mild depression, 10-14 = Moderate depression, 15-19 = Moderately severe depression, 20-27 = Severe depression   Psychosocial Evaluation and Intervention:   Psychosocial Re-Evaluation:   Psychosocial Discharge (Final Psychosocial Re-Evaluation):   Vocational Rehabilitation: Provide vocational rehab assistance to qualifying candidates.   Vocational Rehab Evaluation & Intervention: Vocational Rehab - 11/07/19 1217      Initial Vocational Rehab Evaluation & Intervention   Assessment shows need for Vocational Rehabilitation  No       Education: Education Goals: Education classes will be provided on a weekly basis, covering required topics. Participant will state  understanding/return demonstration of topics presented.  Learning Barriers/Preferences: Learning Barriers/Preferences - 11/07/19 1325      Learning Barriers/Preferences   Learning Barriers  Sight;Hearing   wears glasses and slightly hard of hearing does not wear her hearing aides   Learning Preferences  Pictoral;Written Material;Video       Education Topics: Hypertension, Hypertension Reduction -Define heart disease and high blood pressure. Discus how high blood pressure affects the body and ways to reduce high blood pressure.   Exercise and Your Heart -Discuss why it is important to exercise, the FITT principles of exercise, normal and abnormal responses to exercise, and how to exercise safely.   Angina -Discuss definition of angina, causes of angina, treatment of angina, and how to decrease risk of having angina.   Cardiac Medications -Review what the following cardiac medications are used for, how they affect the body, and side effects that may occur when taking the medications.  Medications include Aspirin, Beta blockers, calcium channel blockers, ACE Inhibitors, angiotensin receptor blockers, diuretics, digoxin, and antihyperlipidemics.   Congestive Heart Failure -Discuss the definition of CHF, how to live with CHF, the signs and symptoms of CHF, and how keep track of weight and sodium intake.   Heart Disease and Intimacy -Discus the effect sexual activity has on the heart, how changes occur during intimacy as we age, and safety during sexual activity.   Smoking Cessation / COPD -Discuss different methods to quit smoking, the health benefits of quitting smoking, and the definition of COPD.   Nutrition I: Fats -Discuss the types of cholesterol, what cholesterol does to the heart, and how cholesterol levels can be controlled.   Nutrition II: Labels -Discuss the different components of food labels and how to read food label   Heart Parts/Heart Disease and  PAD -Discuss the anatomy of the heart, the pathway of blood circulation through the heart, and these are affected by heart disease.   Stress I: Signs and Symptoms -Discuss the causes of stress, how stress may lead to anxiety and depression, and ways to limit stress.  Stress II: Relaxation -Discuss different types of relaxation techniques to limit stress.   Warning Signs of Stroke / TIA -Discuss definition of a stroke, what the signs and symptoms are of a stroke, and how to identify when someone is having stroke.   Knowledge Questionnaire Score: Knowledge Questionnaire Score - 11/07/19 1320      Knowledge Questionnaire Score   Pre Score  21/24       Core Components/Risk Factors/Patient Goals at Admission: Personal Goals and Risk Factors at Admission - 11/07/19 1329      Core Components/Risk Factors/Patient Goals on Admission    Weight Management  Obesity;Yes;Weight Loss    Intervention  Obesity: Provide education and appropriate resources to help participant work on and attain dietary goals.;Weight Management/Obesity: Establish reasonable short term and long term weight goals.    Admit Weight  174 lb 13.2 oz (79.3 kg)    Goal Weight: Long Term  150 lb (68 kg)    Expected Outcomes  Short Term: Continue to assess and modify interventions until short term weight is achieved;Long Term: Adherence to nutrition and physical activity/exercise program aimed toward attainment of established weight goal;Weight Loss: Understanding of general recommendations for a balanced deficit meal plan, which promotes 1-2 lb weight loss per week and includes a negative energy balance of [phone] kcal/d;Understanding recommendations for meals to include 15-35% energy as protein, 25-35% energy from fat, 35-60% energy from carbohydrates, less than 200mg  of dietary cholesterol, 20-35 gm of total fiber daily;Understanding of distribution of calorie intake throughout the day with the consumption of 4-5 meals/snacks     Stress  Yes    Intervention  Refer participants experiencing significant psychosocial distress to appropriate mental health specialists for further evaluation and treatment. When possible, include family members and significant others in education/counseling sessions.    Expected Outcomes  Short Term: Participant demonstrates changes in health-related behavior, relaxation and other stress management skills, ability to obtain effective social support, and compliance with psychotropic medications if prescribed.;Long Term: Emotional wellbeing is indicated by absence of clinically significant psychosocial distress or social isolation.       Core Components/Risk Factors/Patient Goals Review:    Core Components/Risk Factors/Patient Goals at Discharge (Final Review):    ITP Comments: ITP Comments    Row Name 11/07/19 1321           ITP Comments  Dr Fransico Him MD, Medical Director          Comments: Anne Hogan attended orientation on 11/07/2019 to review rules and guidelines for program.  Completed 6 minute walk test, Intitial ITP, and exercise prescription.  VSS. Telemetry-Sinus rhythm.  Asymptomatic. Safety measures and social distancing in place per CDC guidelines.Barnet Pall, RN,BSN 11/07/2019 1:36 PM

## 2019-11-08 ENCOUNTER — Other Ambulatory Visit: Payer: Self-pay | Admitting: *Deleted

## 2019-11-08 ENCOUNTER — Ambulatory Visit (HOSPITAL_COMMUNITY): Payer: PPO

## 2019-11-08 ENCOUNTER — Telehealth: Payer: Self-pay | Admitting: *Deleted

## 2019-11-08 ENCOUNTER — Encounter: Payer: Self-pay | Admitting: *Deleted

## 2019-11-08 DIAGNOSIS — R911 Solitary pulmonary nodule: Secondary | ICD-10-CM | POA: Diagnosis not present

## 2019-11-08 DIAGNOSIS — R58 Hemorrhage, not elsewhere classified: Secondary | ICD-10-CM | POA: Diagnosis not present

## 2019-11-08 NOTE — Telephone Encounter (Signed)
Received a voicemail from the pt that she saw Dr. Roxan Hockey yesterday and they discussed stopping Warfarin and not refilling the prescription. Returned a call to the pt and was able to verify that Dr. Roxan Hockey advised pt to stop Warfarin as she had been on it for 3 months post her surgery. Advised pt I would cancel her appt and she was advised to call back if she needs anything else.

## 2019-11-10 ENCOUNTER — Ambulatory Visit (HOSPITAL_COMMUNITY): Payer: PPO

## 2019-11-10 NOTE — Telephone Encounter (Addendum)
Called to update pt Dr. Curt Bears unexpectedly out for several days this week.  Aware that he will look further into this Monday and we will let her know. She reports:  Pt stopped Amiodarone on 4/22 per Dr. Roxy Manns instructions (She experienced brief Afib post MVR in January, but none since) She has continued Metoprolol until hearing from [place]. Pt aware that it should be fine to stop as she was only taking it w/ the Amiodarone for the PAF.  Will confirm ok to stop w/ Camnitz next week and let her know. She has had no continued palpitations since surgery. Patient verbalized understanding and agreeable to plan.

## 2019-11-13 ENCOUNTER — Encounter (HOSPITAL_COMMUNITY)
Admission: RE | Admit: 2019-11-13 | Discharge: 2019-11-13 | Disposition: A | Discharge status: Home / Self Care | Payer: PPO | Source: Ambulatory Visit | Attending: Cardiology | Admitting: Cardiology

## 2019-11-13 ENCOUNTER — Other Ambulatory Visit: Payer: Self-pay

## 2019-11-13 DIAGNOSIS — Z9889 Other specified postprocedural states: Secondary | ICD-10-CM

## 2019-11-13 NOTE — Progress Notes (Signed)
Discharge Progress Report  Patient Details  Name: Anne Hogan MRN: [id number] Date of Birth: October 17, 1945 Referring Provider:     [facility] from 11/07/2019 in [place]  Referring Provider  Constance Haw, MD       Number of Visits: Orientation  Reason for Discharge:  Early Exit:  Anne Hogan was recently evaluated for a L lower lobe lung mass that is hypermetabolic per PET scan. She has been evaluated by thoracic surgeon and is scheduled for a Left Lower lobectomy 11/23/19. She will be discharged from cardiac rehab however will be readmitted upon request once medically cleared from surgery upcoming lobectomy.  Smoking History:  Social History   Tobacco Use  Smoking Status Former Smoker  . Packs/day: 1.50  . Years: 20.00  . Pack years: 30.00  . Types: Cigarettes  . Quit date: 11/30/1986  . Years since quitting: 32.9  Smokeless Tobacco Never Used    Diagnosis:  S/P Minimally Invasive Mitral Valve Repair 08/03/19  ADL UCSD:   Initial Exercise Prescription: Initial Exercise Prescription - 11/07/19 1200      Date of Initial Exercise RX and Referring Provider   Date  11/07/19    Referring Provider  Constance Haw, MD    Expected Discharge Date  01/05/20      Recumbant Bike   Level  1.5    Watts  10    Minutes  15    METs  2.39      NuStep   Level  2    SPM  85    Minutes  15    METs  2.4      Prescription Details   Frequency (times per week)  3    Duration  Progress to 30 minutes of continuous aerobic without signs/symptoms of physical distress      Intensity   THRR 40-80% of Max Heartrate  59-118    Ratings of Perceived Exertion  11-13    Perceived Dyspnea  0-4      Progression   Progression  Continue to progress workloads to maintain intensity without signs/symptoms of physical distress.      Resistance Training   Training Prescription  Yes    Weight  3lbs    Reps  10-15        Discharge Exercise Prescription (Final Exercise Prescription Changes):   Functional Capacity: 6 Minute Walk    Row Name 11/07/19 1123         6 Minute Walk   Phase  Initial     Distance  1461 feet     Walk Time  6 minutes     # of Rest Breaks  0     MPH  2.77     METS  2.45     RPE  13     Perceived Dyspnea   0     VO2 Peak  8.58     Symptoms  No     Resting HR  66 bpm     Resting BP  124/70     Resting Oxygen Saturation   98 %     Exercise Oxygen Saturation  during 6 min walk  98 %     Max Ex. HR  76 bpm     Max Ex. BP  126/72     2 Minute Post BP  120/68        Psychological, QOL, Others - Outcomes: PHQ 2/9: Depression screen  PHQ 2/9 11/07/2019  Decreased Interest 0  Down, Depressed, Hopeless 0  PHQ - 2 Score 0    Quality of Life: Quality of Life - 11/07/19 1225      Quality of Life   Select  Quality of Life      Quality of Life Scores   Health/Function Pre  28 %    Socioeconomic Pre  30 %    Psych/Spiritual Pre  30 %    Family Pre  30 %    GLOBAL Pre  29.08 %       Personal Goals: Goals established at orientation with interventions provided to work toward goal. Personal Goals and Risk Factors at Admission - 11/07/19 1329      Core Components/Risk Factors/Patient Goals on Admission    Weight Management  Obesity;Yes;Weight Loss    Intervention  Obesity: Provide education and appropriate resources to help participant work on and attain dietary goals.;Weight Management/Obesity: Establish reasonable short term and long term weight goals.    Admit Weight  174 lb 13.2 oz (79.3 kg)    Goal Weight: Long Term  150 lb (68 kg)    Expected Outcomes  Short Term: Continue to assess and modify interventions until short term weight is achieved;Long Term: Adherence to nutrition and physical activity/exercise program aimed toward attainment of established weight goal;Weight Loss: Understanding of general recommendations for a balanced deficit meal plan, which  promotes 1-2 lb weight loss per week and includes a negative energy balance of [phone] kcal/d;Understanding recommendations for meals to include 15-35% energy as protein, 25-35% energy from fat, 35-60% energy from carbohydrates, less than 200mg  of dietary cholesterol, 20-35 gm of total fiber daily;Understanding of distribution of calorie intake throughout the day with the consumption of 4-5 meals/snacks    Stress  Yes    Intervention  Refer participants experiencing significant psychosocial distress to appropriate mental health specialists for further evaluation and treatment. When possible, include family members and significant others in education/counseling sessions.    Expected Outcomes  Short Term: Participant demonstrates changes in health-related behavior, relaxation and other stress management skills, ability to obtain effective social support, and compliance with psychotropic medications if prescribed.;Long Term: Emotional wellbeing is indicated by absence of clinically significant psychosocial distress or social isolation.        Personal Goals Discharge:   Exercise Goals and Review: Exercise Goals    Row Name 11/07/19 1114             Exercise Goals   Increase Physical Activity  Yes       Intervention  Provide advice, education, support and counseling about physical activity/exercise needs.;Develop an individualized exercise prescription for aerobic and resistive training based on initial evaluation findings, risk stratification, comorbidities and participant's personal goals.       Expected Outcomes  Short Term: Attend rehab on a regular basis to increase amount of physical activity.;Long Term: Exercising regularly at least 3-5 days a week.;Long Term: Add in home exercise to make exercise part of routine and to increase amount of physical activity.       Increase Strength and Stamina  Yes       Intervention  Provide advice, education, support and counseling about physical  activity/exercise needs.;Develop an individualized exercise prescription for aerobic and resistive training based on initial evaluation findings, risk stratification, comorbidities and participant's personal goals.       Expected Outcomes  Short Term: Increase workloads from initial exercise prescription for resistance, speed, and METs.;Short Term: Perform resistance  training exercises routinely during rehab and add in resistance training at home;Long Term: Improve cardiorespiratory fitness, muscular endurance and strength as measured by increased METs and functional capacity (6MWT)       Able to understand and use rate of perceived exertion (RPE) scale  Yes       Intervention  Provide education and explanation on how to use RPE scale       Expected Outcomes  Short Term: Able to use RPE daily in rehab to express subjective intensity level;Long Term:  Able to use RPE to guide intensity level when exercising independently       Knowledge and understanding of Target Heart Rate Range (THRR)  Yes       Intervention  Provide education and explanation of THRR including how the numbers were predicted and where they are located for reference       Expected Outcomes  Short Term: Able to state/look up THRR;Long Term: Able to use THRR to govern intensity when exercising independently;Short Term: Able to use daily as guideline for intensity in rehab       Able to check pulse independently  Yes       Intervention  Provide education and demonstration on how to check pulse in carotid and radial arteries.;Review the importance of being able to check your own pulse for safety during independent exercise       Expected Outcomes  Short Term: Able to explain why pulse checking is important during independent exercise;Long Term: Able to check pulse independently and accurately       Understanding of Exercise Prescription  Yes       Intervention  Provide education, explanation, and written materials on patient's individual  exercise prescription       Expected Outcomes  Short Term: Able to explain program exercise prescription;Long Term: Able to explain home exercise prescription to exercise independently          Exercise Goals Re-Evaluation:   Nutrition & Weight - Outcomes: Pre Biometrics - 11/07/19 1109      Pre Biometrics   Height  5' 2.25" (1.581 m)    Weight  79.3 kg    Waist Circumference  36.75 inches    Hip Circumference  44 inches    Waist to Hip Ratio  0.84 %    BMI (Calculated)  31.73    Triceps Skinfold  39 mm    % Body Fat  44.2 %    Grip Strength  32.5 kg    Flexibility  14 in    Single Leg Stand  7.5 seconds        Nutrition:   Nutrition Discharge:   Education Questionnaire Score: Knowledge Questionnaire Score - 11/07/19 1320      Knowledge Questionnaire Score   Pre Score  21/24

## 2019-11-15 ENCOUNTER — Encounter (HOSPITAL_COMMUNITY): Payer: PPO

## 2019-11-17 ENCOUNTER — Encounter: Payer: Self-pay | Admitting: Thoracic Surgery (Cardiothoracic Vascular Surgery)

## 2019-11-17 ENCOUNTER — Encounter (HOSPITAL_COMMUNITY): Payer: PPO

## 2019-11-17 NOTE — Progress Notes (Signed)
[address], [place] [address] Phone: [phone] Fax: [phone]      Your procedure is scheduled on Thursday, Nov 23, 2019.  Report to [facility] Main Entrance "A" at 0600 A.M., and check in at the Admitting office.  Call this number if you have problems the morning of surgery:  [phone]  Call [phone] if you have any questions prior to your surgery date Monday-Friday 8am-4pm    Remember:  Do not eat or drink after midnight the night before your surgery     Take these medicines the morning of surgery with A SIP OF WATER: Acetaminophen (Tylenol) - if needed Albuterol (Proventil) nebulizer - if needed Bupropion (Wellbutrin XL) Fluticasone Furoate-Vilanterol (Breo Ellipta) - if needed Metoprolol tartrate (Lopressor) Montelukast (Singulair)  As of today, STOP taking any Aspirin (unless otherwise instructed by your surgeon) and Aspirin containing products, Aleve, Naproxen, Ibuprofen, Motrin, Advil, Goody's, BC's, all herbal medications, fish oil, and all vitamins.                      Do not wear jewelry, make up, or nail polish            Do not wear lotions, powders, perfumes/colognes, or deodorant.            Do not shave 48 hours prior to surgery.  Men may shave face and neck.            Do not bring valuables to the hospital.            [facility] is not responsible for any belongings or valuables.  Do NOT Smoke (Tobacco/Vapping) or drink Alcohol 24 hours prior to your procedure If you use a CPAP at night, you may bring all equipment for your overnight stay.   Contacts, glasses, dentures or bridgework may not be worn into surgery.      For patients admitted to the hospital, discharge time will be determined by your treatment team.   Patients discharged the day of surgery will not be allowed to drive home, and someone needs to stay with them for 24 hours.    Special  instructions:   [facility]- Preparing For Surgery  Before surgery, you can play an important role. Because skin is not sterile, your skin needs to be as free of germs as possible. You can reduce the number of germs on your skin by washing with CHG (chlorahexidine gluconate) Soap before surgery.  CHG is an antiseptic cleaner which kills germs and bonds with the skin to continue killing germs even after washing.    Oral Hygiene is also important to reduce your risk of infection.  Remember - BRUSH YOUR TEETH THE MORNING OF SURGERY WITH YOUR REGULAR TOOTHPASTE  Please do not use if you have an allergy to CHG or antibacterial soaps. If your skin becomes reddened/irritated stop using the CHG.  Do not shave (including legs and underarms) for at least 48 hours prior to first CHG shower. It is OK to shave your face.  Please follow these instructions carefully.   1. Shower the NIGHT BEFORE SURGERY and the MORNING OF SURGERY with CHG Soap.   2. If you chose to wash your hair, wash your hair first as usual with your normal shampoo.  3. After you shampoo, rinse your hair and body thoroughly to remove the shampoo.  4. Use CHG as you would any other liquid  soap. You can apply CHG directly to the skin and wash gently with a scrungie or a clean washcloth.   5. Apply the CHG Soap to your body ONLY FROM THE NECK DOWN.  Do not use on open wounds or open sores. Avoid contact with your eyes, ears, mouth and genitals (private parts). Wash Face and genitals (private parts)  with your normal soap.   6. Wash thoroughly, paying special attention to the area where your surgery will be performed.  7. Thoroughly rinse your body with warm water from the neck down.  8. DO NOT shower/wash with your normal soap after using and rinsing off the CHG Soap.  9. Pat yourself dry with a CLEAN TOWEL.  10. Wear CLEAN PAJAMAS to bed the night before surgery, wear comfortable clothes the morning of surgery  11. Place CLEAN  SHEETS on your bed the night of your first shower and DO NOT SLEEP WITH PETS.   Day of Surgery:   Do not apply any deodorants/lotions.  Please wear clean clothes to the hospital/surgery center.   Remember to brush your teeth WITH YOUR REGULAR TOOTHPASTE.   Please read over the following fact sheets that you were given.

## 2019-11-20 ENCOUNTER — Encounter (HOSPITAL_COMMUNITY)
Admission: RE | Admit: 2019-11-20 | Discharge: 2019-11-20 | Disposition: A | Discharge status: Home / Self Care | Payer: PPO | Source: Ambulatory Visit | Attending: Thoracic Surgery (Cardiothoracic Vascular Surgery) | Admitting: Thoracic Surgery (Cardiothoracic Vascular Surgery)

## 2019-11-20 ENCOUNTER — Telehealth: Payer: Self-pay

## 2019-11-20 ENCOUNTER — Encounter (HOSPITAL_COMMUNITY): Payer: PPO

## 2019-11-20 ENCOUNTER — Ambulatory Visit (HOSPITAL_COMMUNITY): Admission: RE | Admit: 2019-11-20 | Payer: PPO | Source: Ambulatory Visit

## 2019-11-20 ENCOUNTER — Other Ambulatory Visit: Payer: Self-pay

## 2019-11-20 ENCOUNTER — Other Ambulatory Visit (HOSPITAL_COMMUNITY)
Admission: RE | Admit: 2019-11-20 | Discharge: 2019-11-20 | Disposition: A | Discharge status: Home / Self Care | Payer: PPO | Source: Ambulatory Visit | Attending: Thoracic Surgery (Cardiothoracic Vascular Surgery) | Admitting: Thoracic Surgery (Cardiothoracic Vascular Surgery)

## 2019-11-20 ENCOUNTER — Encounter (HOSPITAL_COMMUNITY): Payer: Self-pay

## 2019-11-20 DIAGNOSIS — Z8249 Family history of ischemic heart disease and other diseases of the circulatory system: Secondary | ICD-10-CM | POA: Diagnosis not present

## 2019-11-20 DIAGNOSIS — J9811 Atelectasis: Secondary | ICD-10-CM | POA: Diagnosis not present

## 2019-11-20 DIAGNOSIS — Z01818 Encounter for other preprocedural examination: Secondary | ICD-10-CM | POA: Diagnosis present

## 2019-11-20 DIAGNOSIS — M1990 Unspecified osteoarthritis, unspecified site: Secondary | ICD-10-CM | POA: Insufficient documentation

## 2019-11-20 DIAGNOSIS — G4733 Obstructive sleep apnea (adult) (pediatric): Secondary | ICD-10-CM | POA: Insufficient documentation

## 2019-11-20 DIAGNOSIS — R911 Solitary pulmonary nodule: Secondary | ICD-10-CM

## 2019-11-20 DIAGNOSIS — I341 Nonrheumatic mitral (valve) prolapse: Secondary | ICD-10-CM | POA: Diagnosis not present

## 2019-11-20 DIAGNOSIS — I509 Heart failure, unspecified: Secondary | ICD-10-CM | POA: Diagnosis not present

## 2019-11-20 DIAGNOSIS — D62 Acute posthemorrhagic anemia: Secondary | ICD-10-CM | POA: Diagnosis present

## 2019-11-20 DIAGNOSIS — R918 Other nonspecific abnormal finding of lung field: Secondary | ICD-10-CM | POA: Diagnosis not present

## 2019-11-20 DIAGNOSIS — Z952 Presence of prosthetic heart valve: Secondary | ICD-10-CM | POA: Diagnosis not present

## 2019-11-20 DIAGNOSIS — J45909 Unspecified asthma, uncomplicated: Secondary | ICD-10-CM | POA: Diagnosis present

## 2019-11-20 DIAGNOSIS — J988 Other specified respiratory disorders: Secondary | ICD-10-CM | POA: Diagnosis not present

## 2019-11-20 DIAGNOSIS — Z20822 Contact with and (suspected) exposure to covid-19: Secondary | ICD-10-CM | POA: Insufficient documentation

## 2019-11-20 DIAGNOSIS — Z7982 Long term (current) use of aspirin: Secondary | ICD-10-CM | POA: Diagnosis not present

## 2019-11-20 DIAGNOSIS — F329 Major depressive disorder, single episode, unspecified: Secondary | ICD-10-CM | POA: Diagnosis present

## 2019-11-20 DIAGNOSIS — Z880 Allergy status to penicillin: Secondary | ICD-10-CM | POA: Diagnosis not present

## 2019-11-20 DIAGNOSIS — Z7951 Long term (current) use of inhaled steroids: Secondary | ICD-10-CM | POA: Diagnosis not present

## 2019-11-20 DIAGNOSIS — Z885 Allergy status to narcotic agent status: Secondary | ICD-10-CM | POA: Diagnosis not present

## 2019-11-20 DIAGNOSIS — I081 Rheumatic disorders of both mitral and tricuspid valves: Secondary | ICD-10-CM | POA: Diagnosis not present

## 2019-11-20 DIAGNOSIS — Z87891 Personal history of nicotine dependence: Secondary | ICD-10-CM | POA: Diagnosis not present

## 2019-11-20 DIAGNOSIS — M179 Osteoarthritis of knee, unspecified: Secondary | ICD-10-CM | POA: Diagnosis not present

## 2019-11-20 DIAGNOSIS — Z4682 Encounter for fitting and adjustment of non-vascular catheter: Secondary | ICD-10-CM | POA: Diagnosis not present

## 2019-11-20 DIAGNOSIS — C3432 Malignant neoplasm of lower lobe, left bronchus or lung: Secondary | ICD-10-CM | POA: Diagnosis present

## 2019-11-20 DIAGNOSIS — Z8774 Personal history of (corrected) congenital malformations of heart and circulatory system: Secondary | ICD-10-CM | POA: Diagnosis not present

## 2019-11-20 DIAGNOSIS — Z79899 Other long term (current) drug therapy: Secondary | ICD-10-CM | POA: Diagnosis not present

## 2019-11-20 DIAGNOSIS — G4730 Sleep apnea, unspecified: Secondary | ICD-10-CM | POA: Diagnosis present

## 2019-11-20 DIAGNOSIS — I5032 Chronic diastolic (congestive) heart failure: Secondary | ICD-10-CM | POA: Diagnosis present

## 2019-11-20 DIAGNOSIS — Z833 Family history of diabetes mellitus: Secondary | ICD-10-CM | POA: Diagnosis not present

## 2019-11-20 DIAGNOSIS — Z96651 Presence of right artificial knee joint: Secondary | ICD-10-CM | POA: Diagnosis present

## 2019-11-20 DIAGNOSIS — Z8262 Family history of osteoporosis: Secondary | ICD-10-CM | POA: Diagnosis not present

## 2019-11-20 LAB — BLOOD GAS, ARTERIAL
Acid-base deficit: 3 mmol/L — ABNORMAL HIGH (ref 0.0–2.0)
Bicarbonate: 20.5 mmol/L (ref 20.0–28.0)
FIO2: 21
O2 Saturation: 98.9 %
Patient temperature: 37
pCO2 arterial: 31 mmHg — ABNORMAL LOW (ref 32.0–48.0)
pH, Arterial: 7.436 (ref 7.350–7.450)
pO2, Arterial: 128 mmHg — ABNORMAL HIGH (ref 83.0–108.0)

## 2019-11-20 LAB — COMPREHENSIVE METABOLIC PANEL
ALT: 17 U/L (ref 0–44)
AST: 20 U/L (ref 15–41)
Albumin: 3.6 g/dL (ref 3.5–5.0)
Alkaline Phosphatase: 54 U/L (ref 38–126)
Anion gap: 7 (ref 5–15)
BUN: 12 mg/dL (ref 8–23)
CO2: 21 mmol/L — ABNORMAL LOW (ref 22–32)
Calcium: 8.7 mg/dL — ABNORMAL LOW (ref 8.9–10.3)
Chloride: 110 mmol/L (ref 98–111)
Creatinine, Ser: 1.2 mg/dL — ABNORMAL HIGH (ref 0.44–1.00)
GFR calc Af Amer: 52 mL/min — ABNORMAL LOW (ref >60)
GFR calc non Af Amer: 45 mL/min — ABNORMAL LOW (ref >60)
Glucose, Bld: 102 mg/dL — ABNORMAL HIGH (ref 70–99)
Potassium: 4 mmol/L (ref 3.5–5.1)
Sodium: 138 mmol/L (ref 135–145)
Total Bilirubin: 0.2 mg/dL — ABNORMAL LOW (ref 0.3–1.2)
Total Protein: 6.6 g/dL (ref 6.5–8.1)

## 2019-11-20 LAB — URINALYSIS, ROUTINE W REFLEX MICROSCOPIC
Bilirubin Urine: NEGATIVE
Glucose, UA: NEGATIVE mg/dL
Ketones, ur: NEGATIVE mg/dL
Leukocytes,Ua: NEGATIVE
Nitrite: NEGATIVE
Protein, ur: NEGATIVE mg/dL
Specific Gravity, Urine: 1.011 (ref 1.005–1.030)
pH: 5 (ref 5.0–8.0)

## 2019-11-20 LAB — SURGICAL PCR SCREEN
MRSA, PCR: NEGATIVE
Staphylococcus aureus: NEGATIVE

## 2019-11-20 LAB — CBC
HCT: 40.1 % (ref 36.0–46.0)
Hemoglobin: 12.8 g/dL (ref 12.0–15.0)
MCH: 28.5 pg (ref 26.0–34.0)
MCHC: 31.9 g/dL (ref 30.0–36.0)
MCV: 89.3 fL (ref 80.0–100.0)
Platelets: 321 10*3/uL (ref 150–400)
RBC: 4.49 MIL/uL (ref 3.87–5.11)
RDW: 14.2 % (ref 11.5–15.5)
WBC: 4.2 10*3/uL (ref 4.0–10.5)
nRBC: 0 % (ref 0.0–0.2)

## 2019-11-20 LAB — TYPE AND SCREEN
ABO/RH(D): O POS
Antibody Screen: NEGATIVE

## 2019-11-20 LAB — PROTIME-INR
INR: 1 (ref 0.8–1.2)
Prothrombin Time: 12.5 seconds (ref 11.4–15.2)

## 2019-11-20 LAB — SARS CORONAVIRUS 2 (TAT 6-24 HRS): SARS Coronavirus 2: NEGATIVE

## 2019-11-20 LAB — APTT: aPTT: 35 seconds (ref 24–36)

## 2019-11-20 NOTE — Telephone Encounter (Signed)
Cierra with [place] Pre Admission testing contacted the office per patient.  Patient stated that she was unsure if she should start her Metoprolol back.  Patient stated, upon calling her to get more information, that she stopped the medication because she thought that someone told her to stop it, but cannot remember who told her that.  Patient stated that she is having palpitations since she quit taking it.  Advised patient to contact her Cardiologist, Dr. Curt Bears.  She acknowledged receipt.

## 2019-11-20 NOTE — Progress Notes (Signed)
PCP - Dr. [name] Cardiologist - Dr. Ocie Doyne [facility]  PPM/ICD - Denies  Chest x-ray - 11/20/19 EKG - 11/20/19 Stress Test - Denies ECHO - 09/13/19 Cardiac Cath - 08/04/18  Sleep Study - Pt recalls having sleep study done over 15-20 years ago and was diagnosed with OSA. Pt does not remember where exactly it was done. CPAP -  Denies  Pt denies being diabetic.   Blood Thinner Instructions: N/A Aspirin Instructions: Continue as prescribed.  Pt states she was told by someone to stop taking her Metoprolol. Gave Dr. Leonarda Salon office a call to verify and they stated that they told pt to call her cardiologist who gave her those instructions. They stated that they will call pt again and remind pt to call her cardiologists office.  ERAS Protcol - No   COVID TEST- 11/20/19  Coronavirus Screening  Have you experienced the following symptoms:  Cough yes/no: No Fever (>100.59F)  yes/no: No Runny nose yes/no: No Sore throat yes/no: No Difficulty breathing/shortness of breath  yes/no: No  Have you or a family member traveled in the last 14 days and where? yes/no: No   If the patient indicates "YES" to the above questions, their PAT will be rescheduled to limit the exposure to others and, the surgeon will be notified. THE PATIENT WILL NEED TO BE ASYMPTOMATIC FOR 14 DAYS.   If the patient is not experiencing any of these symptoms, the PAT nurse will instruct them to NOT bring anyone with them to their appointment since they may have these symptoms or traveled as well.   Please remind your patients and families that hospital visitation restrictions are in effect and the importance of the restrictions.    Anesthesia review: Yes, cardiac hx  Patient denies shortness of breath, fever, cough and chest pain at PAT appointment   All instructions explained to the patient, with a verbal understanding of the material. Patient agrees to go over the instructions while at home for a better  understanding. Patient also instructed to self quarantine after being tested for COVID-19. The opportunity to ask questions was provided.

## 2019-11-21 ENCOUNTER — Telehealth: Payer: Self-pay | Admitting: Cardiology

## 2019-11-21 NOTE — Progress Notes (Signed)
Anesthesia Chart Review:  History of mitral valve prolapse and severe mitral regurgitation s/p MVR, congestive heart failure, patent foramen ovale, arthritis, asthma, sleep apnea.  She presented with worsening exertional shortness of breath.  She had been started on Singulair and Breo Ellipta prior to being found to have worsening heart failure. She was referred to Dr. Roxy Manns and scheduled for mitral valve repair.  As part of her preoperative work-up she has had a CT angiogram which showed a left lower lobe lung nodule.This was a mixed density nodule.  A PET/CT was done which showed the nodule was hypermetabolic with SUV of 3.90.  There is no evidence of regional or distant metastases.  She underwent a minimally invasive mitral valve repair and closure of a patent foramen ovale on 08/03/2019.  Her postoperative course was uneventful.Last seen by Dr. Roxy Manns 10/09/19, doing well, advised to increase activity gradually and followup with Dr. Roxan Hockey in 4-6 weeks to address lung mass.   Per Dr. Leonarda Salon consult note 11/07/19, "She is feeling well.  Her exercise tolerance is much better than it was prior to surgery.  She saw Dr. Roxy Manns on 10/09/2019 and is now referred back to address the pulmonary nodule."  Preop labs reviewed, unremarkable.   EKG 11/20/19: NSR. Rate 86.  CT Chest 10/02/19: IMPRESSION: 1. No substantial interval change in the mixed attenuation central left lower lobe pulmonary lesion with tiny satellite nodules. Lesion noted to be hypermetabolic on recent PET-CT. 2. 11 mm subpleural lesion along the dome of the right hemidiaphragm has high attenuation approaching calcium and is new since 07/28/2019 but high density suggests benign etiology. This is not a typical appearance for aspirated contrast material. 3. Aortic Atherosclerosis (ICD10-I70.0) and Emphysema (ICD10-J43.9).   TTE 09/13/19: 1. Left ventricular ejection fraction, by estimation, is 60 to 65%. The  left ventricle has  normal function. The left ventricle has no regional  wall motion abnormalities. Left ventricular diastolic parameters are  consistent with Grade I diastolic  dysfunction (impaired relaxation).  2. Right ventricular systolic function is normal. The right ventricular  size is normal. There is mildly elevated pulmonary artery systolic  pressure.  3. Left atrial size was mild to moderately dilated.  4. The mitral valve has been repaired/replaced. No evidence of mitral  valve regurgitation. No evidence of mitral stenosis. There is a 38 mm  prosthetic annuloplasty ring present in the mitral position. Procedure  Date: 08/03/2019. Echo findings are  consistent with normal structure and function of the mitral valve  prosthesis.  5. The aortic valve is tricuspid. Aortic valve regurgitation is mild. No  aortic stenosis is present.  6. The inferior vena cava is normal in size with greater than 50%  respiratory variability, suggesting right atrial pressure of 3 mmHg.   R/L cath (pre MVR) 05/26/19:  No angiographically apparent coronary artery disease.  The left ventricular ejection fraction is greater than 65% by visual estimate.  The left ventricular systolic function is normal.  LV end diastolic pressure is normal.  There is severe (4+) mitral regurgitation.  There is no aortic valve stenosis.  Hemodynamic findings consistent with mild pulmonary hypertension.  Ao sat 99%, PA sat 75%, mean PA pressure 26 mm Hg; mean PCWP 13 mm Hg;  Aortogram showed no AAA and no renal artery stenosis. Widely patent iliac arteries bilaterally.   Proceed with MV repair w/u.  Discharge from short stay later today.   Wynonia Musty [facility] Short Stay Center/Anesthesiology Phone [phone] 11/21/2019 4:54 PM

## 2019-11-21 NOTE — Telephone Encounter (Signed)
New message   Pt c/o medication issue:  1. Name of Medication: metoprolol tartrate (LOPRESSOR) 25 MG tablet  2. How are you currently taking this medication (dosage and times per day)? As written  3. Are you having a reaction (difficulty breathing--STAT)? No   4. What is your medication issue? Patient wants to know if know if she should be taking metoprolol tartrate (LOPRESSOR) 25 MG tablet.

## 2019-11-21 NOTE — Telephone Encounter (Signed)
Informed pt that I have removed those medications from her list. She reports that TCTS already instructed her to hold her ASA morning of procedure. She appreciates my follow up.

## 2019-11-21 NOTE — Telephone Encounter (Signed)
Pt calling back asking for Dr. Macky Lower nurse Venida Jarvis, RN, give her a call back ASAP, concerning her medications. Pt states that she will be having lung surgery on Thursday and wanted to know if she should stop her Aspirin and she also wanted to know about her metoprolol, that she stop taking this medication, but it is still on her medication list. Please address

## 2019-11-22 ENCOUNTER — Encounter (HOSPITAL_COMMUNITY): Payer: PPO

## 2019-11-22 ENCOUNTER — Ambulatory Visit (HOSPITAL_COMMUNITY)
Admission: RE | Admit: 2019-11-22 | Discharge: 2019-11-22 | Disposition: A | Discharge status: Home / Self Care | Payer: PPO | Source: Ambulatory Visit | Attending: Thoracic Surgery (Cardiothoracic Vascular Surgery) | Admitting: Thoracic Surgery (Cardiothoracic Vascular Surgery)

## 2019-11-22 ENCOUNTER — Other Ambulatory Visit: Payer: Self-pay

## 2019-11-22 DIAGNOSIS — R911 Solitary pulmonary nodule: Secondary | ICD-10-CM | POA: Diagnosis not present

## 2019-11-22 DIAGNOSIS — J988 Other specified respiratory disorders: Secondary | ICD-10-CM | POA: Insufficient documentation

## 2019-11-22 LAB — PULMONARY FUNCTION TEST
DL/VA % pred: 69 %
DL/VA: 2.92 ml/min/mmHg/L
DLCO cor % pred: 76 %
DLCO cor: 13.7 ml/min/mmHg
DLCO unc % pred: 74 %
DLCO unc: 13.45 ml/min/mmHg
FEF 25-75 Post: 1.83 L/sec
FEF 25-75 Pre: 1.35 L/sec
FEF2575-%Change-Post: 36 %
FEF2575-%Pred-Post: 111 %
FEF2575-%Pred-Pre: 81 %
FEV1-%Change-Post: 8 %
FEV1-%Pred-Post: 116 %
FEV1-%Pred-Pre: 108 %
FEV1-Post: 2.32 L
FEV1-Pre: 2.15 L
FEV1FVC-%Change-Post: 2 %
FEV1FVC-%Pred-Pre: 92 %
FEV6-%Change-Post: 4 %
FEV6-%Pred-Post: 125 %
FEV6-%Pred-Pre: 120 %
FEV6-Post: 3.17 L
FEV6-Pre: 3.04 L
FEV6FVC-%Change-Post: -1 %
FEV6FVC-%Pred-Post: 102 %
FEV6FVC-%Pred-Pre: 103 %
FVC-%Change-Post: 5 %
FVC-%Pred-Post: 122 %
FVC-%Pred-Pre: 116 %
FVC-Post: 3.25 L
FVC-Pre: 3.08 L
Post FEV1/FVC ratio: 72 %
Post FEV6/FVC ratio: 98 %
Pre FEV1/FVC ratio: 70 %
Pre FEV6/FVC Ratio: 99 %

## 2019-11-22 MED ORDER — ALBUTEROL SULFATE (2.5 MG/3ML) 0.083% IN NEBU
2.5000 mg | INHALATION_SOLUTION | Freq: Once | RESPIRATORY_TRACT | Status: AC
  Administered 2019-11-22: 2.5 mg via RESPIRATORY_TRACT

## 2019-11-23 ENCOUNTER — Encounter (HOSPITAL_COMMUNITY): Payer: PPO

## 2019-11-23 ENCOUNTER — Other Ambulatory Visit: Payer: Self-pay

## 2019-11-23 ENCOUNTER — Inpatient Hospital Stay (HOSPITAL_COMMUNITY): Payer: PPO | Admitting: Anesthesiology

## 2019-11-23 ENCOUNTER — Inpatient Hospital Stay (HOSPITAL_COMMUNITY): Payer: PPO

## 2019-11-23 ENCOUNTER — Encounter (HOSPITAL_COMMUNITY)
Admission: RE | Disposition: A | Discharge status: Home / Self Care | Payer: Self-pay | Source: Home / Self Care | Attending: Thoracic Surgery (Cardiothoracic Vascular Surgery)

## 2019-11-23 ENCOUNTER — Encounter (HOSPITAL_COMMUNITY): Payer: Self-pay | Admitting: Thoracic Surgery (Cardiothoracic Vascular Surgery)

## 2019-11-23 ENCOUNTER — Inpatient Hospital Stay (HOSPITAL_COMMUNITY)
Admission: RE | Admit: 2019-11-23 | Discharge: 2019-11-26 | DRG: 164 | Disposition: A | Discharge status: Home / Self Care | Payer: PPO | Attending: Thoracic Surgery (Cardiothoracic Vascular Surgery) | Admitting: Thoracic Surgery (Cardiothoracic Vascular Surgery)

## 2019-11-23 ENCOUNTER — Inpatient Hospital Stay (HOSPITAL_COMMUNITY): Payer: PPO | Admitting: Physician Assistant

## 2019-11-23 DIAGNOSIS — Z20822 Contact with and (suspected) exposure to covid-19: Secondary | ICD-10-CM | POA: Diagnosis present

## 2019-11-23 DIAGNOSIS — J45909 Unspecified asthma, uncomplicated: Secondary | ICD-10-CM | POA: Diagnosis present

## 2019-11-23 DIAGNOSIS — Z87891 Personal history of nicotine dependence: Secondary | ICD-10-CM | POA: Diagnosis not present

## 2019-11-23 DIAGNOSIS — Z8262 Family history of osteoporosis: Secondary | ICD-10-CM | POA: Diagnosis not present

## 2019-11-23 DIAGNOSIS — Z96651 Presence of right artificial knee joint: Secondary | ICD-10-CM | POA: Diagnosis present

## 2019-11-23 DIAGNOSIS — Z8774 Personal history of (corrected) congenital malformations of heart and circulatory system: Secondary | ICD-10-CM | POA: Diagnosis not present

## 2019-11-23 DIAGNOSIS — F329 Major depressive disorder, single episode, unspecified: Secondary | ICD-10-CM | POA: Diagnosis present

## 2019-11-23 DIAGNOSIS — I5032 Chronic diastolic (congestive) heart failure: Secondary | ICD-10-CM | POA: Diagnosis present

## 2019-11-23 DIAGNOSIS — R911 Solitary pulmonary nodule: Secondary | ICD-10-CM

## 2019-11-23 DIAGNOSIS — Z8249 Family history of ischemic heart disease and other diseases of the circulatory system: Secondary | ICD-10-CM

## 2019-11-23 DIAGNOSIS — Z09 Encounter for follow-up examination after completed treatment for conditions other than malignant neoplasm: Secondary | ICD-10-CM

## 2019-11-23 DIAGNOSIS — G4730 Sleep apnea, unspecified: Secondary | ICD-10-CM | POA: Diagnosis present

## 2019-11-23 DIAGNOSIS — Z7951 Long term (current) use of inhaled steroids: Secondary | ICD-10-CM

## 2019-11-23 DIAGNOSIS — Z902 Acquired absence of lung [part of]: Secondary | ICD-10-CM

## 2019-11-23 DIAGNOSIS — Z885 Allergy status to narcotic agent status: Secondary | ICD-10-CM

## 2019-11-23 DIAGNOSIS — Z833 Family history of diabetes mellitus: Secondary | ICD-10-CM | POA: Diagnosis not present

## 2019-11-23 DIAGNOSIS — Z880 Allergy status to penicillin: Secondary | ICD-10-CM | POA: Diagnosis not present

## 2019-11-23 DIAGNOSIS — C3432 Malignant neoplasm of lower lobe, left bronchus or lung: Principal | ICD-10-CM | POA: Diagnosis present

## 2019-11-23 DIAGNOSIS — D62 Acute posthemorrhagic anemia: Secondary | ICD-10-CM | POA: Diagnosis present

## 2019-11-23 DIAGNOSIS — Z7982 Long term (current) use of aspirin: Secondary | ICD-10-CM

## 2019-11-23 DIAGNOSIS — Z79899 Other long term (current) drug therapy: Secondary | ICD-10-CM | POA: Diagnosis not present

## 2019-11-23 DIAGNOSIS — Z419 Encounter for procedure for purposes other than remedying health state, unspecified: Secondary | ICD-10-CM

## 2019-11-23 HISTORY — PX: INTERCOSTAL NERVE BLOCK: SHX5021

## 2019-11-23 HISTORY — PX: LYMPH NODE DISSECTION: SHX5087

## 2019-11-23 SURGERY — LOBECTOMY, LUNG, ROBOT-ASSISTED, USING VATS
Anesthesia: General | Site: Chest | Laterality: Left

## 2019-11-23 MED ORDER — LIDOCAINE 2% (20 MG/ML) 5 ML SYRINGE
INTRAMUSCULAR | Status: DC | PRN
  Administered 2019-11-23: 40 mg via INTRAVENOUS

## 2019-11-23 MED ORDER — PANTOPRAZOLE SODIUM 40 MG PO TBEC
40.0000 mg | DELAYED_RELEASE_TABLET | Freq: Every day | ORAL | Status: DC
  Administered 2019-11-23 – 2019-11-26 (×4): 40 mg via ORAL
  Filled 2019-11-23 (×4): qty 1

## 2019-11-23 MED ORDER — KETOROLAC TROMETHAMINE 15 MG/ML IJ SOLN
15.0000 mg | Freq: Three times a day (TID) | INTRAMUSCULAR | Status: AC | PRN
  Administered 2019-11-23 – 2019-11-24 (×3): 15 mg via INTRAVENOUS
  Filled 2019-11-23 (×3): qty 1

## 2019-11-23 MED ORDER — ONDANSETRON HCL 4 MG/2ML IJ SOLN
INTRAMUSCULAR | Status: AC
  Filled 2019-11-23: qty 2

## 2019-11-23 MED ORDER — LACTATED RINGERS IV SOLN: INTRAVENOUS | Status: DC | PRN

## 2019-11-23 MED ORDER — ALBUTEROL SULFATE (2.5 MG/3ML) 0.083% IN NEBU: 2.5000 mg | INHALATION_SOLUTION | RESPIRATORY_TRACT | Status: DC

## 2019-11-23 MED ORDER — MEPERIDINE HCL 25 MG/ML IJ SOLN: 6.2500 mg | INTRAMUSCULAR | Status: DC | PRN

## 2019-11-23 MED ORDER — ACETAMINOPHEN 160 MG/5ML PO SOLN: 1000.0000 mg | Freq: Four times a day (QID) | ORAL | Status: DC

## 2019-11-23 MED ORDER — ENOXAPARIN SODIUM 40 MG/0.4ML ~~LOC~~ SOLN
40.0000 mg | Freq: Every day | SUBCUTANEOUS | Status: DC
  Administered 2019-11-23 – 2019-11-25 (×3): 40 mg via SUBCUTANEOUS
  Filled 2019-11-23 (×3): qty 0.4

## 2019-11-23 MED ORDER — ASPIRIN EC 81 MG PO TBEC
81.0000 mg | DELAYED_RELEASE_TABLET | Freq: Every day | ORAL | Status: DC
  Administered 2019-11-23 – 2019-11-26 (×4): 81 mg via ORAL
  Filled 2019-11-23 (×5): qty 1

## 2019-11-23 MED ORDER — 0.9 % SODIUM CHLORIDE (POUR BTL) OPTIME
TOPICAL | Status: DC | PRN
  Administered 2019-11-23: 2000 mL

## 2019-11-23 MED ORDER — PROMETHAZINE HCL 25 MG/ML IJ SOLN: 6.2500 mg | INTRAMUSCULAR | Status: DC | PRN

## 2019-11-23 MED ORDER — PROPOFOL 10 MG/ML IV BOLUS
INTRAVENOUS | Status: DC | PRN
  Administered 2019-11-23: 100 mg via INTRAVENOUS

## 2019-11-23 MED ORDER — DIPHENHYDRAMINE HCL 50 MG/ML IJ SOLN
INTRAMUSCULAR | Status: AC
  Filled 2019-11-23: qty 1

## 2019-11-23 MED ORDER — FENTANYL CITRATE (PF) 100 MCG/2ML IJ SOLN: 25.0000 ug | INTRAMUSCULAR | Status: DC | PRN

## 2019-11-23 MED ORDER — PHENYLEPHRINE 40 MCG/ML (10ML) SYRINGE FOR IV PUSH (FOR BLOOD PRESSURE SUPPORT)
PREFILLED_SYRINGE | INTRAVENOUS | Status: DC | PRN
  Administered 2019-11-23: 120 ug via INTRAVENOUS
  Administered 2019-11-23: 80 ug via INTRAVENOUS

## 2019-11-23 MED ORDER — ACETAMINOPHEN 325 MG PO TABS: 325.0000 mg | ORAL_TABLET | Freq: Once | ORAL | Status: DC | PRN

## 2019-11-23 MED ORDER — SUGAMMADEX SODIUM 200 MG/2ML IV SOLN
INTRAVENOUS | Status: DC | PRN
  Administered 2019-11-23 (×3): 100 mg via INTRAVENOUS

## 2019-11-23 MED ORDER — ALBUMIN HUMAN 5 % IV SOLN
INTRAVENOUS | Status: DC | PRN
Start: 2019-11-23 — End: 2019-11-23

## 2019-11-23 MED ORDER — FENTANYL CITRATE (PF) 250 MCG/5ML IJ SOLN
INTRAMUSCULAR | Status: AC
  Filled 2019-11-23: qty 5

## 2019-11-23 MED ORDER — SENNOSIDES-DOCUSATE SODIUM 8.6-50 MG PO TABS
1.0000 | ORAL_TABLET | Freq: Every day | ORAL | Status: DC
  Administered 2019-11-23: 1 via ORAL
  Filled 2019-11-23: qty 1

## 2019-11-23 MED ORDER — EPHEDRINE SULFATE-NACL 50-0.9 MG/10ML-% IV SOSY
PREFILLED_SYRINGE | INTRAVENOUS | Status: DC | PRN
  Administered 2019-11-23: 10 mg via INTRAVENOUS

## 2019-11-23 MED ORDER — MIDAZOLAM HCL 2 MG/2ML IJ SOLN
INTRAMUSCULAR | Status: AC
  Filled 2019-11-23: qty 2

## 2019-11-23 MED ORDER — KETOROLAC TROMETHAMINE 15 MG/ML IJ SOLN
INTRAMUSCULAR | Status: AC
  Filled 2019-11-23: qty 1

## 2019-11-23 MED ORDER — ACETAMINOPHEN 160 MG/5ML PO SOLN: 325.0000 mg | Freq: Once | ORAL | Status: DC | PRN

## 2019-11-23 MED ORDER — VANCOMYCIN HCL IN DEXTROSE 1-5 GM/200ML-% IV SOLN
INTRAVENOUS | Status: AC
  Administered 2019-11-23: 1000 mg via INTRAVENOUS
  Filled 2019-11-23: qty 200

## 2019-11-23 MED ORDER — ALBUTEROL SULFATE (2.5 MG/3ML) 0.083% IN NEBU
2.5000 mg | INHALATION_SOLUTION | RESPIRATORY_TRACT | Status: DC
  Administered 2019-11-23 – 2019-11-24 (×3): 2.5 mg via RESPIRATORY_TRACT
  Filled 2019-11-23 (×3): qty 3

## 2019-11-23 MED ORDER — ACETAMINOPHEN 10 MG/ML IV SOLN
INTRAVENOUS | Status: AC
  Filled 2019-11-23: qty 100

## 2019-11-23 MED ORDER — HEMOSTATIC AGENTS (NO CHARGE) OPTIME
TOPICAL | Status: DC | PRN
  Administered 2019-11-23: 2 via TOPICAL

## 2019-11-23 MED ORDER — BUPROPION HCL ER (XL) 300 MG PO TB24
300.0000 mg | ORAL_TABLET | Freq: Every day | ORAL | Status: DC
  Administered 2019-11-23 – 2019-11-26 (×4): 300 mg via ORAL
  Filled 2019-11-23 (×4): qty 1

## 2019-11-23 MED ORDER — ROCURONIUM BROMIDE 10 MG/ML (PF) SYRINGE
PREFILLED_SYRINGE | INTRAVENOUS | Status: DC | PRN
  Administered 2019-11-23 (×3): 20 mg via INTRAVENOUS
  Administered 2019-11-23: 60 mg via INTRAVENOUS

## 2019-11-23 MED ORDER — LACTATED RINGERS IV SOLN: INTRAVENOUS | Status: DC

## 2019-11-23 MED ORDER — PHENYLEPHRINE HCL-NACL 10-0.9 MG/250ML-% IV SOLN
INTRAVENOUS | Status: DC | PRN
  Administered 2019-11-23: 50 ug/min via INTRAVENOUS

## 2019-11-23 MED ORDER — CHLORHEXIDINE GLUCONATE CLOTH 2 % EX PADS
6.0000 | MEDICATED_PAD | Freq: Every day | CUTANEOUS | Status: DC
  Administered 2019-11-24 – 2019-11-26 (×4): 6 via TOPICAL

## 2019-11-23 MED ORDER — PROPOFOL 10 MG/ML IV BOLUS
INTRAVENOUS | Status: AC
  Filled 2019-11-23: qty 20

## 2019-11-23 MED ORDER — SODIUM CHLORIDE 0.9 % IR SOLN
Status: DC | PRN
  Administered 2019-11-23: 1000 mL

## 2019-11-23 MED ORDER — SODIUM CHLORIDE 0.9 % IV SOLN: INTRAVENOUS | Status: DC | PRN

## 2019-11-23 MED ORDER — DIPHENHYDRAMINE HCL 50 MG/ML IJ SOLN
INTRAMUSCULAR | Status: DC | PRN
  Administered 2019-11-23: 25 mg via INTRAVENOUS

## 2019-11-23 MED ORDER — LACTATED RINGERS IV SOLN
INTRAVENOUS | Status: DC | PRN
Start: 2019-11-23 — End: 2019-11-23

## 2019-11-23 MED ORDER — ACETAMINOPHEN 10 MG/ML IV SOLN
1000.0000 mg | Freq: Once | INTRAVENOUS | Status: DC | PRN
  Administered 2019-11-23: 1000 mg via INTRAVENOUS

## 2019-11-23 MED ORDER — ONDANSETRON HCL 4 MG/2ML IJ SOLN
INTRAMUSCULAR | Status: DC | PRN
  Administered 2019-11-23: 4 mg via INTRAVENOUS

## 2019-11-23 MED ORDER — MIDAZOLAM HCL 5 MG/5ML IJ SOLN
INTRAMUSCULAR | Status: DC | PRN
  Administered 2019-11-23: 2 mg via INTRAVENOUS

## 2019-11-23 MED ORDER — BUPIVACAINE HCL (PF) 0.5 % IJ SOLN
INTRAMUSCULAR | Status: AC
  Filled 2019-11-23: qty 30

## 2019-11-23 MED ORDER — TRAMADOL HCL 50 MG PO TABS
50.0000 mg | ORAL_TABLET | Freq: Four times a day (QID) | ORAL | Status: DC | PRN
  Administered 2019-11-23 – 2019-11-24 (×2): 50 mg via ORAL
  Administered 2019-11-24 – 2019-11-26 (×4): 100 mg via ORAL
  Filled 2019-11-23 (×3): qty 2
  Filled 2019-11-23: qty 1
  Filled 2019-11-23: qty 2
  Filled 2019-11-23: qty 1

## 2019-11-23 MED ORDER — ONDANSETRON HCL 4 MG/2ML IJ SOLN: 4.0000 mg | Freq: Four times a day (QID) | INTRAMUSCULAR | Status: DC | PRN

## 2019-11-23 MED ORDER — BISACODYL 5 MG PO TBEC
10.0000 mg | DELAYED_RELEASE_TABLET | Freq: Every day | ORAL | Status: DC
  Administered 2019-11-24: 10 mg via ORAL
  Filled 2019-11-23: qty 2

## 2019-11-23 MED ORDER — VANCOMYCIN HCL IN DEXTROSE 1-5 GM/200ML-% IV SOLN: 1000.0000 mg | INTRAVENOUS | Status: AC

## 2019-11-23 MED ORDER — DEXAMETHASONE SODIUM PHOSPHATE 10 MG/ML IJ SOLN
INTRAMUSCULAR | Status: DC | PRN
  Administered 2019-11-23: 10 mg via INTRAVENOUS

## 2019-11-23 MED ORDER — FENTANYL CITRATE (PF) 250 MCG/5ML IJ SOLN
INTRAMUSCULAR | Status: DC | PRN
  Administered 2019-11-23 (×2): 25 ug via INTRAVENOUS
  Administered 2019-11-23: 100 ug via INTRAVENOUS
  Administered 2019-11-23: 50 ug via INTRAVENOUS

## 2019-11-23 MED ORDER — SODIUM CHLORIDE 0.9 % IV SOLN: INTRAVENOUS | Status: DC

## 2019-11-23 MED ORDER — LIDOCAINE 2% (20 MG/ML) 5 ML SYRINGE
INTRAMUSCULAR | Status: AC
  Filled 2019-11-23: qty 5

## 2019-11-23 MED ORDER — LEVOFLOXACIN IN D5W 750 MG/150ML IV SOLN
750.0000 mg | Freq: Once | INTRAVENOUS | Status: AC
  Administered 2019-11-23: 750 mg via INTRAVENOUS
  Filled 2019-11-23: qty 150

## 2019-11-23 MED ORDER — ROCURONIUM BROMIDE 10 MG/ML (PF) SYRINGE
PREFILLED_SYRINGE | INTRAVENOUS | Status: AC
  Filled 2019-11-23: qty 10

## 2019-11-23 MED ORDER — DEXAMETHASONE SODIUM PHOSPHATE 10 MG/ML IJ SOLN
INTRAMUSCULAR | Status: AC
  Filled 2019-11-23: qty 1

## 2019-11-23 MED ORDER — ACETAMINOPHEN 500 MG PO TABS
1000.0000 mg | ORAL_TABLET | Freq: Four times a day (QID) | ORAL | Status: DC
  Administered 2019-11-23 – 2019-11-25 (×7): 1000 mg via ORAL
  Filled 2019-11-23 (×9): qty 2

## 2019-11-23 MED ORDER — SODIUM CHLORIDE FLUSH 0.9 % IV SOLN
INTRAVENOUS | Status: DC | PRN
  Administered 2019-11-23: 100 mL

## 2019-11-23 SURGICAL SUPPLY — 131 items
ADH SKN CLS APL DERMABOND .7 (GAUZE/BANDAGES/DRESSINGS) ×1
APPLIER CLIP ROT 10 11.4 M/L (STAPLE)
APR CLP MED LRG 11.4X10 (STAPLE)
BAG TISS RTRVL C300 12X14 (MISCELLANEOUS) ×1
BLADE CLIPPER SURG (BLADE) ×3 IMPLANT
BLADE SURG SZ11 CARB STEEL (BLADE) ×3 IMPLANT
BNDG COHESIVE 6X5 TAN STRL LF (GAUZE/BANDAGES/DRESSINGS) IMPLANT
CANISTER SUCT 3000ML PPV (MISCELLANEOUS) ×6 IMPLANT
CANNULA REDUC XI 12-8 STAPL (CANNULA) ×4
CANNULA REDUC XI 12-8MM STAPL (CANNULA) ×2
CANNULA REDUCER 12-8 DVNC XI (CANNULA) ×2 IMPLANT
CATH THORACIC 28FR (CATHETERS) ×2 IMPLANT
CATH THORACIC 28FR RT ANG (CATHETERS) IMPLANT
CATH THORACIC 36FR (CATHETERS) IMPLANT
CATH THORACIC 36FR RT ANG (CATHETERS) IMPLANT
CLIP APPLIE ROT 10 11.4 M/L (STAPLE) IMPLANT
CLIP VESOCCLUDE MED 6/CT (CLIP) IMPLANT
CNTNR URN SCR LID CUP LEK RST (MISCELLANEOUS) ×5 IMPLANT
CONN ST 1/4X3/8  BEN (MISCELLANEOUS) ×3
CONN ST 1/4X3/8 BEN (MISCELLANEOUS) IMPLANT
CONN Y 3/8X3/8X3/8  BEN (MISCELLANEOUS)
CONN Y 3/8X3/8X3/8 BEN (MISCELLANEOUS) IMPLANT
CONT SPEC 4OZ STRL OR WHT (MISCELLANEOUS) ×30
DEFOGGER SCOPE WARMER CLEARIFY (MISCELLANEOUS) ×3 IMPLANT
DERMABOND ADVANCED (GAUZE/BANDAGES/DRESSINGS) ×2
DERMABOND ADVANCED .7 DNX12 (GAUZE/BANDAGES/DRESSINGS) ×1 IMPLANT
DRAIN CHANNEL 28F RND 3/8 FF (WOUND CARE) ×2 IMPLANT
DRAIN CHANNEL 32F RND 10.7 FF (WOUND CARE) IMPLANT
DRAPE ARM DVNC X/XI (DISPOSABLE) ×4 IMPLANT
DRAPE COLUMN DVNC XI (DISPOSABLE) ×1 IMPLANT
DRAPE CV SPLIT W-CLR ANES SCRN (DRAPES) ×3 IMPLANT
DRAPE DA VINCI XI ARM (DISPOSABLE) ×12
DRAPE DA VINCI XI COLUMN (DISPOSABLE) ×3
DRAPE INCISE IOBAN 66X45 STRL (DRAPES) IMPLANT
DRAPE ORTHO SPLIT 77X108 STRL (DRAPES) ×3
DRAPE SURG ORHT 6 SPLT 77X108 (DRAPES) ×1 IMPLANT
DRAPE WARM FLUID 44X44 (DRAPES) ×3 IMPLANT
ELECT BLADE 6.5 EXT (BLADE) ×2 IMPLANT
ELECT REM PT RETURN 9FT ADLT (ELECTROSURGICAL) ×3
ELECTRODE REM PT RTRN 9FT ADLT (ELECTROSURGICAL) ×1 IMPLANT
GAUZE KITTNER 4X5 RF (MISCELLANEOUS) ×8 IMPLANT
GAUZE SPONGE 4X4 12PLY STRL (GAUZE/BANDAGES/DRESSINGS) ×3 IMPLANT
GAUZE SPONGE 4X4 12PLY STRL LF (GAUZE/BANDAGES/DRESSINGS) ×2 IMPLANT
GLOVE BIOGEL PI IND STRL 6.5 (GLOVE) IMPLANT
GLOVE BIOGEL PI IND STRL 7.5 (GLOVE) IMPLANT
GLOVE BIOGEL PI IND STRL 8 (GLOVE) IMPLANT
GLOVE BIOGEL PI INDICATOR 6.5 (GLOVE) ×2
GLOVE BIOGEL PI INDICATOR 7.5 (GLOVE) ×6
GLOVE BIOGEL PI INDICATOR 8 (GLOVE) ×4
GLOVE SS N UNI LF 7.5 STRL (GLOVE) ×6 IMPLANT
GLOVE TRIUMPH SURG SIZE 7.5 (KITS) ×6 IMPLANT
GOWN STRL REUS W/ TWL LRG LVL3 (GOWN DISPOSABLE) ×2 IMPLANT
GOWN STRL REUS W/ TWL XL LVL3 (GOWN DISPOSABLE) ×3 IMPLANT
GOWN STRL REUS W/TWL 2XL LVL3 (GOWN DISPOSABLE) ×3 IMPLANT
GOWN STRL REUS W/TWL LRG LVL3 (GOWN DISPOSABLE) ×6
GOWN STRL REUS W/TWL XL LVL3 (GOWN DISPOSABLE) ×9
HEMOSTAT SURGICEL 2X14 (HEMOSTASIS) ×7 IMPLANT
IRRIGATION STRYKERFLOW (MISCELLANEOUS) IMPLANT
IRRIGATOR STRYKERFLOW (MISCELLANEOUS)
KIT BASIN OR (CUSTOM PROCEDURE TRAY) ×3 IMPLANT
KIT SUCTION CATH 14FR (SUCTIONS) IMPLANT
KIT TURNOVER KIT B (KITS) ×3 IMPLANT
LOOP VESSEL SUPERMAXI WHITE (MISCELLANEOUS) IMPLANT
NDL HYPO 25GX1X1/2 BEV (NEEDLE) ×1 IMPLANT
NDL SPNL 22GX3.5 QUINCKE BK (NEEDLE) ×1 IMPLANT
NEEDLE HYPO 25GX1X1/2 BEV (NEEDLE) ×3 IMPLANT
NEEDLE SPNL 22GX3.5 QUINCKE BK (NEEDLE) ×3 IMPLANT
NS IRRIG 1000ML POUR BTL (IV SOLUTION) ×5 IMPLANT
OIL SILICONE PENTAX (PARTS (SERVICE/REPAIRS)) ×2 IMPLANT
PACK CHEST (CUSTOM PROCEDURE TRAY) ×3 IMPLANT
PAD ARMBOARD 7.5X6 YLW CONV (MISCELLANEOUS) ×6 IMPLANT
RELOAD STAPLE 45 2.5 WHT DVNC (STAPLE) IMPLANT
RELOAD STAPLE 45 3.5 BLU DVNC (STAPLE) IMPLANT
RELOAD STAPLE 45 4.3 GRN DVNC (STAPLE) IMPLANT
RELOAD STAPLER 2.5X45 WHT DVNC (STAPLE) ×3 IMPLANT
RELOAD STAPLER 3.5X45 BLU DVNC (STAPLE) ×5 IMPLANT
RELOAD STAPLER 4.3X45 GRN DVNC (STAPLE) ×2 IMPLANT
SCISSORS LAP 5X35 DISP (ENDOMECHANICALS) IMPLANT
SEAL CANN UNIV 5-8 DVNC XI (MISCELLANEOUS) ×2 IMPLANT
SEAL XI 5MM-8MM UNIVERSAL (MISCELLANEOUS) ×6
SEALANT PROGEL (MISCELLANEOUS) IMPLANT
SEALANT SURG COSEAL 4ML (VASCULAR PRODUCTS) IMPLANT
SEALANT SURG COSEAL 8ML (VASCULAR PRODUCTS) IMPLANT
SET TUBE SMOKE EVAC HIGH FLOW (TUBING) ×3 IMPLANT
SHEARS HARMONIC HDI 20CM (ELECTROSURGICAL) IMPLANT
SOLUTION ELECTROLUBE (MISCELLANEOUS) IMPLANT
SPECIMEN JAR MEDIUM (MISCELLANEOUS) IMPLANT
SPONGE INTESTINAL PEANUT (DISPOSABLE) IMPLANT
SPONGE TONSIL TAPE 1 RFD (DISPOSABLE) IMPLANT
STAPLER CANNULA SEAL DVNC XI (STAPLE) ×2 IMPLANT
STAPLER CANNULA SEAL XI (STAPLE) ×6
STAPLER RELOAD 2.5X45 WHITE (STAPLE) ×9
STAPLER RELOAD 2.5X45 WHT DVNC (STAPLE) ×3
STAPLER RELOAD 3.5X45 BLU DVNC (STAPLE) ×5
STAPLER RELOAD 3.5X45 BLUE (STAPLE) ×15
STAPLER RELOAD 4.3X45 GREEN (STAPLE) ×6
STAPLER RELOAD 4.3X45 GRN DVNC (STAPLE) ×2
STOPCOCK 4 WAY LG BORE MALE ST (IV SETS) IMPLANT
SUT PDS AB 3-0 SH 27 (SUTURE) IMPLANT
SUT PROLENE 4 0 RB 1 (SUTURE)
SUT PROLENE 4-0 RB1 .5 CRCL 36 (SUTURE) IMPLANT
SUT SILK  1 MH (SUTURE) ×6
SUT SILK 1 MH (SUTURE) ×1 IMPLANT
SUT SILK 1 TIES 10X30 (SUTURE) ×3 IMPLANT
SUT SILK 2 0 SH (SUTURE) ×2 IMPLANT
SUT SILK 2 0SH CR/8 30 (SUTURE) IMPLANT
SUT SILK 3 0 SH 30 (SUTURE) IMPLANT
SUT SILK 3 0SH CR/8 30 (SUTURE) IMPLANT
SUT VIC AB 1 CTX 36 (SUTURE) ×3
SUT VIC AB 1 CTX36XBRD ANBCTR (SUTURE) IMPLANT
SUT VIC AB 2-0 CTX 36 (SUTURE) ×2 IMPLANT
SUT VIC AB 3-0 MH 27 (SUTURE) IMPLANT
SUT VIC AB 3-0 X1 27 (SUTURE) ×7 IMPLANT
SUT VICRYL 0 TIES 12 18 (SUTURE) ×3 IMPLANT
SUT VICRYL 0 UR6 27IN ABS (SUTURE) ×6 IMPLANT
SUT VICRYL 2 TP 1 (SUTURE) IMPLANT
SYR 10ML LL (SYRINGE) IMPLANT
SYR 20ML ECCENTRIC (SYRINGE) ×1 IMPLANT
SYR 30ML LL (SYRINGE) ×5 IMPLANT
SYR 50ML LL SCALE MARK (SYRINGE) IMPLANT
SYSTEM RETRIEVAL ANCHOR 12 (MISCELLANEOUS) ×2 IMPLANT
SYSTEM SAHARA CHEST DRAIN ATS (WOUND CARE) ×3 IMPLANT
TAPE CLOTH 4X10 WHT NS (GAUZE/BANDAGES/DRESSINGS) ×3 IMPLANT
TAPE CLOTH SURG 4X10 WHT LF (GAUZE/BANDAGES/DRESSINGS) ×2 IMPLANT
TIP APPLICATOR SPRAY EXTEND 16 (VASCULAR PRODUCTS) IMPLANT
TOWEL GREEN STERILE (TOWEL DISPOSABLE) ×3 IMPLANT
TRAY FOLEY MTR SLVR 16FR STAT (SET/KITS/TRAYS/PACK) ×3 IMPLANT
TROCAR BLADELESS 15MM (ENDOMECHANICALS) IMPLANT
TROCAR XCEL 12X100 BLDLESS (ENDOMECHANICALS) ×3 IMPLANT
TUBING EXTENTION W/L.L. (IV SETS) IMPLANT
WATER STERILE IRR 1000ML POUR (IV SOLUTION) ×3 IMPLANT

## 2019-11-23 NOTE — Anesthesia Procedure Notes (Signed)
Procedure Name: Intubation Date/Time: 11/23/2019 8:15 AM Performed by: Trinna Post., CRNA Pre-anesthesia Checklist: Patient identified, Emergency Drugs available, Suction available, Patient being monitored and Timeout performed Patient Re-evaluated:Patient Re-evaluated prior to induction Oxygen Delivery Method: Circle system utilized Preoxygenation: Pre-oxygenation with 100% oxygen Induction Type: IV induction Ventilation: Mask ventilation without difficulty Laryngoscope Size: Mac and 3 Grade View: Grade I Endobronchial tube: Left, Double lumen EBT, EBT position confirmed by fiberoptic bronchoscope and EBT position confirmed by auscultation and 37 Fr Number of attempts: 1 Airway Equipment and Method: Rigid stylet and Fiberoptic brochoscope Placement Confirmation: ETT inserted through vocal cords under direct vision,  positive ETCO2 and breath sounds checked- equal and bilateral Tube secured with: Tape Dental Injury: Teeth and Oropharynx as per pre-operative assessment

## 2019-11-23 NOTE — Anesthesia Preprocedure Evaluation (Addendum)
Anesthesia Evaluation  Patient identified by MRN, date of birth, ID band Patient awake    Reviewed: Allergy & Precautions, NPO status , Patient's Chart, lab work & pertinent test results  Airway Mallampati: III  TM Distance: <3 FB Neck ROM: Full    Dental  (+) Teeth Intact, Dental Advisory Given   Pulmonary asthma , sleep apnea , former smoker,    breath sounds clear to auscultation       Cardiovascular +CHF  + dysrhythmias + Valvular Problems/Murmurs MVP  Rhythm:Regular Rate:Normal  - s/p MINI   Neuro/Psych  Headaches, PSYCHIATRIC DISORDERS Anxiety Depression    GI/Hepatic Neg liver ROS, GERD  ,  Endo/Other    Renal/GU      Musculoskeletal   Abdominal Normal abdominal exam  (+)   Peds  Hematology   Anesthesia Other Findings   Reproductive/Obstetrics                            Anesthesia Physical Anesthesia Plan  ASA: III  Anesthesia Plan: General   Post-op Pain Management:    Induction: Intravenous  PONV Risk Score and Plan: 4 or greater and Ondansetron and Treatment may vary due to age or medical condition  Airway Management Planned: Oral ETT  Additional Equipment: CVP and Arterial line  Intra-op Plan:   Post-operative Plan: Extubation in OR  Informed Consent: I have reviewed the patients History and Physical, chart, labs and discussed the procedure including the risks, benefits and alternatives for the proposed anesthesia with the patient or authorized representative who has indicated his/her understanding and acceptance.     Dental advisory given  Plan Discussed with: CRNA  Anesthesia Plan Comments:         Anesthesia Quick Evaluation

## 2019-11-23 NOTE — Brief Op Note (Signed)
11/23/2019  11:43 AM  PATIENT:  Anne Hogan  74 y.o. female  PRE-OPERATIVE DIAGNOSIS:  Left lower lobe lung nodule   POST-OPERATIVE DIAGNOSIS:  Left lower lobe lung nodule   PROCEDURE:  Procedure(s): XI ROBOTIC ASSISTED THORASCOPY-LEFT LOWER LOBECTOMY (Left) Intercostal Nerve Block (Left) Lymph Node Dissection (Left)  SURGEON:  Surgeon(s) and Role:    Melrose Nakayama, MD - Primary  PHYSICIAN ASSISTANT: Kroy Sprung PA-C  ANESTHESIA:   general  EBL:  50 mL   BLOOD ADMINISTERED:none  DRAINS: (1) Blake drain(s) in the LEFT HEMITHORAX   LOCAL MEDICATIONS USED:  EXPAREL  SPECIMEN:  Source of Specimen:  LLL AND MULTIPLE LN SAMPLES  DISPOSITION OF SPECIMEN:  PATHOLOGY  COUNTS:  YES  TOURNIQUET:  * No tourniquets in log *  DICTATION: .Other Dictation: Dictation Number [id number]  PLAN OF CARE: Admit to inpatient   PATIENT DISPOSITION:  PACU - hemodynamically stable.   Delay start of Pharmacological VTE agent (>24hrs) due to surgical blood loss or risk of bleeding: yes  COMPLICATIONS: NO KNOWN

## 2019-11-23 NOTE — OR Nursing (Signed)
Hives noted at procedure positioning on left torso. Dr. Roxan Hockey aware. Has Penicillin allergy, pre-op Vancomycin given by CRNA. Hassan Rowan, CRNA to give [place] for hive reaction.

## 2019-11-23 NOTE — Plan of Care (Signed)

## 2019-11-23 NOTE — Anesthesia Procedure Notes (Signed)
Arterial Line Insertion Start/End5/12/2019 7:33 AM Performed by: Kyung Rudd, CRNA, CRNA  Patient location: Pre-op. Preanesthetic checklist: patient identified, IV checked, site marked, risks and benefits discussed, surgical consent, monitors and equipment checked, pre-op evaluation, timeout performed and anesthesia consent Lidocaine 1% used for infiltration and patient sedated Right, radial was placed Catheter size: 20 G Hand hygiene performed  and maximum sterile barriers used   Attempts: 2 Procedure performed using ultrasound guided technique. Ultrasound Notes:anatomy identified, needle tip was noted to be adjacent to the nerve/plexus identified, no ultrasound evidence of intravascular and/or intraneural injection and image(s) printed for medical record Following insertion, dressing applied and Biopatch. Post procedure assessment: normal  Patient tolerated the procedure well with no immediate complications.

## 2019-11-23 NOTE — Anesthesia Procedure Notes (Signed)
Central Venous Catheter Insertion Performed by: Effie Berkshire, MD, anesthesiologist Start/End5/12/2019 7:10 AM, 11/23/2019 7:20 AM Patient location: Pre-op. Preanesthetic checklist: patient identified, IV checked, site marked, risks and benefits discussed, surgical consent, monitors and equipment checked, pre-op evaluation, timeout performed and anesthesia consent Position: Trendelenburg Lidocaine 1% used for infiltration and patient sedated Hand hygiene performed , maximum sterile barriers used  and Seldinger technique used Catheter size: 8 Fr Total catheter length 16. Central line was placed.Double lumen Procedure performed using ultrasound guided technique. Ultrasound Notes:anatomy identified, needle tip was noted to be adjacent to the nerve/plexus identified, no ultrasound evidence of intravascular and/or intraneural injection and image(s) printed for medical record Attempts: 1 Following insertion, dressing applied, line sutured and Biopatch. Post procedure assessment: blood return through all ports  Patient tolerated the procedure well with no immediate complications.

## 2019-11-23 NOTE — Plan of Care (Signed)
Initiate Care Plan Problem: Education: Goal: Knowledge of General Education information will improve Description: Including pain rating scale, medication(s)/side effects and non-pharmacologic comfort measures Outcome: Progressing   Problem: Health Behavior/Discharge Planning: Goal: Ability to manage health-related needs will improve Outcome: Progressing   Problem: Clinical Measurements: Goal: Ability to maintain clinical measurements within normal limits will improve Outcome: Progressing Goal: Will remain free from infection Outcome: Progressing Goal: Diagnostic test results will improve Outcome: Progressing Goal: Respiratory complications will improve Outcome: Progressing Goal: Cardiovascular complication will be avoided Outcome: Progressing   Problem: Activity: Goal: Risk for activity intolerance will decrease Outcome: Progressing   Problem: Nutrition: Goal: Adequate nutrition will be maintained Outcome: Progressing   Problem: Coping: Goal: Level of anxiety will decrease Outcome: Progressing   Problem: Elimination: Goal: Will not experience complications related to bowel motility Outcome: Progressing Goal: Will not experience complications related to urinary retention Outcome: Progressing   Problem: Pain Managment: Goal: General experience of comfort will improve Outcome: Progressing   Problem: Safety: Goal: Ability to remain free from injury will improve Outcome: Progressing   Problem: Skin Integrity: Goal: Risk for impaired skin integrity will decrease Outcome: Progressing   Problem: Education: Goal: Knowledge of disease or condition will improve Outcome: Progressing Goal: Knowledge of the prescribed therapeutic regimen will improve Outcome: Progressing   Problem: Activity: Goal: Risk for activity intolerance will decrease Outcome: Progressing   Problem: Cardiac: Goal: Will achieve and/or maintain hemodynamic stability Outcome: Progressing    Problem: Clinical Measurements: Goal: Postoperative complications will be avoided or minimized Outcome: Progressing   Problem: Respiratory: Goal: Respiratory status will improve Outcome: Progressing   Problem: Pain Management: Goal: Pain level will decrease Outcome: Progressing   Problem: Skin Integrity: Goal: Wound healing without signs and symptoms infection will improve Outcome: Progressing

## 2019-11-23 NOTE — Op Note (Signed)
NAME: Anne Hogan, NORDMANN MEDICAL RECORD YP:[id number] ACCOUNT [id number] DATE OF BIRTH:1945/08/15 FACILITY: [facility] LOCATION: [facility] PHYSICIAN:[place] Souter Chaya Jan, MD  OPERATIVE REPORT  DATE OF PROCEDURE:  11/23/2019  PREOPERATIVE DIAGNOSIS:  Left lower lobe lung nodule.  POSTOPERATIVE DIAGNOSIS:  Adenocarcinoma of left lower lobe, clinical stage IA (T1, N0).  PROCEDURES:   1.  Robotic-assisted left lower lobectomy.  2.  Lymph node dissection.   3.  Intercostal nerve block levels 3 through 10.  SURGEON:  Modesto Charon, MD  ASSISTANT:  Jadene Pierini, PA  ANESTHESIA:  General.  FINDINGS:  Frozen section revealed adenocarcinoma.  Bronchial margin free of tumor.  CLINICAL NOTE:  [name] is a 74 year old woman who was found to have a mixed density nodule in the left lower lobe on a CT scan that was done prior to evaluation for mitral valve repair.  On PET CT, this area was hypermetabolic with an SUV of 7.12.   She proceeded with mitral valve repair and now returns to address the pulmonary nodule.  On a followup CT the nodule was unchanged to slightly larger.  She was advised to undergo surgical resection.  The indications, risks, benefits, and alternatives  were discussed in detail with the patient.  She understood and accepted the risks and agreed to proceed.  OPERATIVE NOTE:  [name] was brought to the preoperative holding area on 11/23/2019.  Anesthesia placed a central venous catheter and an arterial blood pressure monitoring line.  She was taken to the operating room, anesthetized and intubated with a  double lumen endotracheal tube.  A Foley catheter was placed.  Intravenous antibiotics were administered.  Sequential compression devices were placed on the calves for DVT prophylaxis.  She was noted to have a rash develop with vancomycin infusion consistent with a "red man reaction."   This was primarily in the head and neck region.  Single-lung ventilation of the right lung was  initiated and was tolerated well throughout the procedure.  A timeout was performed.  A solution containing 20 mL of liposomal bupivacaine, 30 mL of 0.5% bupivacaine and 50 mL of saline was prepared.  This was used for local at the incisions, as well as for the intercostal nerve blocks.  An incision was made in  the 8th interspace in the mid axillary line.  An 8 mm port was inserted.  The thoracoscope was advanced into the chest.  Carbon dioxide was insufflated into the chest to a pressure of 10 mmHg.  The patient tolerated that well hemodynamically.  Twelve mm  ports were placed anterior and posterior to the camera port and an additional 8 mm robotic port was placed in the 8th interspace more posteriorly.  A 12 mm assistant port was placed in the 10th interspace centered between the 2 anterior ports.   Intercostal nerve blocks were performed by injecting 10 mL of the bupivacaine solution into a subpleural plane at each interspace from the 3rd to the 10th.   The robot was deployed and the camera port was docked.  Targeting was performed.  The remainder  of the ports were docked.  Instruments were inserted with thoracoscopic visualization.  From the console, the dissection was begun.  The inferior ligament was divided.  All lymph nodes that were encountered appeared relatively benign.  These were all removed and sent as separate specimens for permanent pathology.  The pleural reflection was  divided at the hilum posteriorly and the dissection was carried down to the posterior aspect of the pulmonary artery  and the left lower lobe bronchus.  Moving superiorly, the aortopulmonary window nodes were removed.  The lung was inspected and what  initially appeared to be an incomplete fissure, was really just primarily adhesions between the upper and lower lobes.  These were taken down with cautery and then there still was an area of the fissure that was incomplete.  The pleural reflection was  divided at the hilum  medially and anteriorly.  Division of the fissure was begun with sequential firings of the robotic stapler.  Blue cartridges were used for the parenchyma.  As the hilum was approached, additional dissection was performed.  At this  point, the lung was retracted superiorly and the inferior pulmonary vein was encircled and divided with the vascular stapler. In the fissure, the pulmonary artery was identified and the posterior aspect of the fissure was completed with a stapler and then  finally the remainder of the incomplete fissure over the lower lobe pulmonary artery was divided with the stapler.  The basilar portion of the pulmonary artery to the lower lobe was encircled and divided with the vascular stapler and then the superior  segmental branches were divided as well.  The robotic stapler with a green cartridge was placed across the left lower lobe bronchus at its origin.  A test inflation showed good aeration of the upper lobe and the lower lobe bronchus was divided.  After  dividing the bronchus, it was seen that it was divided on a slight bevel and an additional staple firing was done to remove the bevel on the bronchus.  This piece of the bronchus was sent as a final bronchial margin.  The left lower lobe was placed into  an endoscopic retrieval bag.  The robot was undocked.  The anterior 8th interspace incision was lengthened and the lower lobe was removed.  The specimen was sent for frozen section of the mass and the bronchial margin.  The mass returned showing  adenocarcinoma.  The bronchial margin was negative for tumor.  The chest was copiously irrigated with warm saline.  A test inflation revealed no leakage from the bronchial stump.  Inspection was made to ensure that all sponges that had been inserted during the procedure were removed.  The counts were correct.  A  28-French Blake drain was placed through the original port incision and secured with a #1 silk suture.  Dual-lung ventilation  was resumed.  The remaining incisions were closed in standard fashion.  Dermabond was applied.  The chest tube was placed to a  Pleur-evac at waterseal.  The patient then was extubated in the operating room and taken to the [facility] in good condition.  VN/NUANCE  D:11/23/2019 T:11/23/2019 JOB:[id number]

## 2019-11-23 NOTE — Transfer of Care (Signed)
Immediate Anesthesia Transfer of Care Note  Patient: Anne Hogan  Procedure(s) Performed: XI ROBOTIC ASSISTED THORASCOPY-LEFT LOWER LOBECTOMY (Left Chest) Intercostal Nerve Block (Left Chest) Lymph Node Dissection (Left Chest)  Patient Location: PACU  Anesthesia Type:General  Level of Consciousness: awake, alert  and oriented  Airway & Oxygen Therapy: Patient Spontanous Breathing and Patient connected to face mask oxygen  Post-op Assessment: Report given to RN and Post -op Vital signs reviewed and stable  Post vital signs: Reviewed and stable  Last Vitals:  Vitals Value Taken Time  BP 113/62 11/23/19 1213  Temp    Pulse 66 11/23/19 1217  Resp 16 11/23/19 1218  SpO2 100 % 11/23/19 1218  Vitals shown include unvalidated device data.  Last Pain:  Vitals:   11/23/19 0654  TempSrc:   PainSc: 0-No pain      Patients Stated Pain Goal: 3 (68/08/81 1031)  Complications: No apparent anesthesia complications

## 2019-11-24 ENCOUNTER — Inpatient Hospital Stay (HOSPITAL_COMMUNITY): Payer: PPO

## 2019-11-24 ENCOUNTER — Encounter (HOSPITAL_COMMUNITY): Payer: PPO

## 2019-11-24 ENCOUNTER — Encounter: Payer: Self-pay | Admitting: *Deleted

## 2019-11-24 LAB — BASIC METABOLIC PANEL
Anion gap: 5 (ref 5–15)
BUN: 9 mg/dL (ref 8–23)
CO2: 24 mmol/L (ref 22–32)
Calcium: 8 mg/dL — ABNORMAL LOW (ref 8.9–10.3)
Chloride: 110 mmol/L (ref 98–111)
Creatinine, Ser: 1.11 mg/dL — ABNORMAL HIGH (ref 0.44–1.00)
GFR calc Af Amer: 57 mL/min — ABNORMAL LOW (ref >60)
GFR calc non Af Amer: 49 mL/min — ABNORMAL LOW (ref >60)
Glucose, Bld: 110 mg/dL — ABNORMAL HIGH (ref 70–99)
Potassium: 4.2 mmol/L (ref 3.5–5.1)
Sodium: 139 mmol/L (ref 135–145)

## 2019-11-24 LAB — CBC
HCT: 35.6 % — ABNORMAL LOW (ref 36.0–46.0)
Hemoglobin: 11 g/dL — ABNORMAL LOW (ref 12.0–15.0)
MCH: 27.9 pg (ref 26.0–34.0)
MCHC: 30.9 g/dL (ref 30.0–36.0)
MCV: 90.4 fL (ref 80.0–100.0)
Platelets: 271 10*3/uL (ref 150–400)
RBC: 3.94 MIL/uL (ref 3.87–5.11)
RDW: 14.1 % (ref 11.5–15.5)
WBC: 8.7 10*3/uL (ref 4.0–10.5)
nRBC: 0 % (ref 0.0–0.2)

## 2019-11-24 MED ORDER — ALBUTEROL SULFATE (2.5 MG/3ML) 0.083% IN NEBU
2.5000 mg | INHALATION_SOLUTION | Freq: Four times a day (QID) | RESPIRATORY_TRACT | Status: DC | PRN
  Administered 2019-11-25: 2.5 mg via RESPIRATORY_TRACT
  Filled 2019-11-24: qty 3

## 2019-11-24 NOTE — Discharge Summary (Addendum)
Physician Discharge Summary  Patient ID: Anne Hogan MRN: [id number] DOB/AGE: 03-30-1946 74 y.o.  Admit date: 11/23/2019 Discharge date: 11/26/2019  Admission Diagnoses: Left lower lobe lung mass, suspected non-small cell lung cancer, Clinical stage IA(T1N0)  Discharge Diagnoses: Adenocarcinoma left lower lobe Clinical and Pathologic stage IA (T1N0) Active Problems:   S/P lobectomy of lung  Patient Active Problem List   Diagnosis Date Noted  . S/P lobectomy of lung 11/23/2019  . S/P minimally-invasive mitral valve repair 08/03/2019  . S/P patent foramen ovale closure 08/03/2019  . Chronic diastolic congestive heart failure ([place])   . Tricuspid regurgitation   . Incidental pulmonary nodule, greater than or equal to 5mm 06/06/2019  . OA (osteoarthritis) of knee 09/21/2016  . Mitral valve prolapse 08/25/2016  . Severe mitral regurgitation 08/25/2016   HPI: [name] is sent for consultation regarding a left lower lobe lung nodule.     Anne Hogan is a 74 year old woman with a history of mitral valve prolapse, severe mitral regurgitation, congestive heart failure, patent foramen ovale, arthritis, asthma, sleep apnea, reflux, claustrophobia, depression, and palpitations.  She presented with worsening exertional shortness of breath.  She had been started on Singulair and Breo Ellipta prior to being found to have worsening heart failure.  She was referred to Dr. Roxy Manns and scheduled for mitral valve repair.  As part of her preoperative work-up she has had a CT angiogram which showed a left lower lobe lung nodule.  This was a mixed density nodule.  A PET/CT was done which showed the nodule was hypermetabolic with SUV of 7.40.  There is no evidence of regional or distant metastases.        She underwent a minimally invasive mitral valve repair and closure of a patent foramen ovale on 08/03/2019.  Her postoperative course was uneventful.     She is feeling well.  Her exercise tolerance  is much better than it was prior to surgery.  She saw Dr. Roxy Manns on 10/09/2019 and is now referred back to address the pulmonary nodule.     He has a history of smoking but quit in 1987.  She currently is having far less shortness of breath than she had prior to her mitral valve repair.  She denies any unusual headaches or visual changes.  She did lose a little weight around the time of surgery, but her appetite is good currently.  The patient was admitted electively for robot-assisted thoracoscopic surgery for left lower lobe lobectomy.  Discharged Condition: good  Hospital Course: The patient was admitted electively and on 11/23/2019 she was taken the operating room where she underwent the below described procedure.  She tolerated it well was taken to the postanesthesia care unit in stable condition.  Postoperative hospital course:  Patient has done well.  She has maintained stable hemodynamics and normal sinus rhythm.  All routine lines, monitors and drainage devices have been discontinued in a standard fashion.  She does have a mild expected acute blood loss anemia which is stable.  Most recent hemoglobin hematocrit dated 11/25/2019 are 10.7 and 34.3 respectively.  Electrolytes BUN and creatinine are within normal limits.  Chest x-ray is quite stable in appearance and chest tube has been removed.  Incisions are noted to be healing well without evidence of infection.  She is tolerating gradually increasing activities using standard postoperative protocols.  Final pathology is currently pending.  Oxygen has been weaned and she maintains good saturations on room air.  At the time of discharge  the patient is felt to be quite stable.  Consults: None   PATHOLOGY:pending  Significant Diagnostic Studies: routine post-op labs and serial CXR;s  Treatments: surgery:  OPERATIVE REPORT  DATE OF PROCEDURE:  11/23/2019  PREOPERATIVE DIAGNOSIS:  Left lower lobe lung nodule.  POSTOPERATIVE DIAGNOSIS:   Adenocarcinoma of left lower lobe, clinical stage IA (T1, N0).  PROCEDURES:   1.  Robotic-assisted left lower lobectomy.  2.  Lymph node dissection.   3.  Intercostal nerve block levels 3 through 10.  SURGEON:  Modesto Charon, MD  ASSISTANT:  Jadene Pierini, PA  ANESTHESIA:  General.  FINDINGS:  Frozen section revealed adenocarcinoma.  Bronchial margin free of tumor.    Discharge Exam: Blood pressure 130/74, pulse 88, temperature 98.6 F (37 C), temperature source Oral, resp. rate 19, height 5' 2.25" (1.581 m), weight 78.9 kg, SpO2 98 %.  General appearance: alert, cooperative and no distress Heart: regular rate and rhythm Lungs: clear to auscultation bilaterally Abdomen: benign Extremities: no edema or calf tenderness Wound: incis healing well   Disposition: Discharge disposition: 01-Home or Self Care       Discharge Instructions    Discharge patient   Complete by: As directed    Discharge disposition: 01-Home or Self Care   Discharge patient date: 11/26/2019     Allergies as of 11/26/2019      Reactions   Penicillins Rash   Has patient had a PCN reaction causing immediate rash, facial/tongue/throat swelling, SOB or lightheadedness with hypotension: No Has patient had a PCN reaction causing severe rash involving mucus membranes or skin necrosis: no Has patient had a PCN reaction that required hospitalization: No Has patient had a PCN reaction occurring within the last 10 years: no If all of the above answers are "NO", then may proceed with Cephalosporin use.    Codeine Other (See Comments)   Possible sensitivity, patient not sure   Milk-related Compounds Nausea And Vomiting, Other (See Comments)   Can have ice cream, pt just doesn't want to drink milk   Morphine And Related    "strange sensation across midsection and chest"      Medication List    TAKE these medications   acetaminophen 500 MG tablet Commonly known as: TYLENOL Take 500-1,000 mg by  mouth every 6 (six) hours as needed for moderate pain (pain).   albuterol (2.5 MG/3ML) 0.083% nebulizer solution Commonly known as: PROVENTIL Take 2.5 mg by nebulization every 4 (four) hours as needed for wheezing or shortness of breath.   aspirin 81 MG EC tablet Take 1 tablet (81 mg total) by mouth daily.   Biotin 5000 MCG Tabs Take 5,000 mcg by mouth daily.   Breo Ellipta 100-25 MCG/INH Aepb Generic drug: fluticasone furoate-vilanterol USE 1 PUFF DAILY AS DIRECTED What changed:   how much to take  how to take this  when to take this  reasons to take this  additional instructions   buPROPion 300 MG 24 hr tablet Commonly known as: WELLBUTRIN XL Take 300 mg by mouth daily.   CALCIUM/VITAMIN D3 GUMMIES PO Take 2 tablets by mouth daily.   docusate sodium 100 MG capsule Commonly known as: COLACE Take 100 mg by mouth daily as needed for mild constipation.   Gaviscon Extra Strength 160-105 MG Chew Generic drug: Alum Hydroxide-Mag Carbonate Chew 1 tablet by mouth 2 (two) times daily as needed (indigestion).   montelukast 10 MG tablet Commonly known as: SINGULAIR Take 10 mg by mouth daily as needed (respiratory issues.).  multivitamin with minerals tablet Take 1 tablet by mouth daily. [employer] Women's Masters 50+   traMADol 50 MG tablet Commonly known as: ULTRAM Take 1 tablet (50 mg total) by mouth every 6 (six) hours as needed for up to 7 days (mild pain).   Vitamin D (Ergocalciferol) 1.25 MG (50000 UNIT) Caps capsule Commonly known as: DRISDOL Take 50,000 Units by mouth 2 (two) times a week. Sundays & Thursdays.      Follow-up Information    Melrose Nakayama, MD Follow up.   Specialty: Cardiothoracic Surgery Why: Please see discharge paperwork for follow-up appointment with Dr. Roxan Hockey.  Please obtain a chest x-ray at [facility] 1/2-hour prior to this appointment.  It is located in the same office complex on the first floor. Contact  information: [address] [place] [address] [phone]           Signed: John Giovanni PA-C 11/26/2019, 7:08 AM

## 2019-11-24 NOTE — H&P (Signed)
PCP is Avva, Steva Ready, MD  Referring Provider is Rexene Alberts, MD          Chief Complaint    Patient presents with    .   Lung Mass            LLLobe.Marland KitchenMarland KitchenSurgical eval, Chest CT-super D 10/02/19, PET Scan 07/28/19          HPI: [name] is sent for consultation regarding a left lower lobe lung nodule.     [name] is a 74 year old woman with a history of mitral valve prolapse, severe mitral regurgitation, congestive heart failure, patent foramen ovale, arthritis, asthma, sleep apnea, reflux, claustrophobia, depression, and palpitations.  She presented with worsening exertional shortness of breath.  She had been started on Singulair and Breo Ellipta prior to being found to have worsening heart failure.  She was referred to Dr. Roxy Manns and scheduled for mitral valve repair.  As part of her preoperative work-up she has had a CT angiogram which showed a left lower lobe lung nodule.  This was a mixed density nodule.  A PET/CT was done which showed the nodule was hypermetabolic with SUV of 3.41.  There is no evidence of regional or distant metastases.        She underwent a minimally invasive mitral valve repair and closure of a patent foramen ovale on 08/03/2019.  Her postoperative course was uneventful.     She is feeling well.  Her exercise tolerance is much better than it was prior to surgery.  She saw Dr. Roxy Manns on 10/09/2019 and is now referred back to address the pulmonary nodule.     He has a history of smoking but quit in 1987.  She currently is having far less shortness of breath than she had prior to her mitral valve repair.  She denies any unusual headaches or visual changes.  She did lose a little weight around the time of surgery, but her appetite is good currently.  Zubrod Score:  At the time of surgery this patient's most appropriate activity status/level should be described as:  ?    0    Normal activity, no symptoms  ?    1    Restricted in  physical strenuous activity but ambulatory, able to do out light work  ?    2    Ambulatory and capable of self care, unable to do work activities, up and about >50 % of waking hours                               ?    3    Only limited self care, in bed greater than 50% of waking hours  ?    4    Completely disabled, no self care, confined to bed or chair  ?    5    Moribund          Past Medical History:    Diagnosis   Date    .   ADD (attention deficit disorder)        .   Arthritis            "right knee" (04/19/2013)    .   Asthma   04/03/2019    .   CHF (congestive heart failure) ([place])        .   Chronic diastolic congestive heart failure ([place])        .  Claustrophobia        .   Depression        .   Dyspnea        .   Dysrhythmia        .   Fracture of right tibial plateau   05/2016    .   GERD (gastroesophageal reflux disease)        .   Heart murmur        .   Incidental pulmonary nodule, greater than or equal to 71mm   06/06/2019        Needs f/u CT in 3 months    .   Migraine            "bad when I was in 5th or 6th grade; I can usually ward them off by resting my head now" (04/19/2013)    .   MVP (mitral valve prolapse)        .   S/P minimally-invasive mitral valve repair   08/03/2019        Complex valvuloplasty including artificial Gore-tex neochords x12 and Sorin Memo 4D ring annuloplasty, size 38 mm    .   S/P patent foramen ovale closure   08/03/2019    .   Sleep apnea            has not used mouth guard in 5 years no cpap used     .   Squamous carcinoma   ?1999        "upper left groin" (04/19/2013)                Past Surgical History:    Procedure   Laterality   Date    .   CARDIAC CATHETERIZATION       05/26/2019    .   CHOLECYSTECTOMY       04/18/2013    .   CHOLECYSTECTOMY   N/A    04/18/2013        Procedure: LAPAROSCOPIC CHOLECYSTECTOMY WITH INTRAOPERATIVE CHOLANGIOGRAM;  Surgeon: Ralene Ok, MD;  Location: [facility];  Service: General;  Laterality: N/A;    .   COLONOSCOPY W/ POLYPECTOMY            .   EYE SURGERY   Bilateral   FALL  2016        IOC FOR CATARACTS    .   JOINT REPLACEMENT            .   KNEE ARTHROSCOPY   Right   12/2007        "meniscus repair" (04/19/2013)    .   MITRAL VALVE REPAIR   Right   08/03/2019        Procedure: MINIMALLY INVASIVE MITRAL VALVE REPAIR (MVR) using Memo 4D 38 MM Mitral Valve.;  Surgeon: Rexene Alberts, MD;  Location: [facility];  Service: Open Heart Surgery;  Laterality: Right;    .   REPAIR OF PATENT FORAMEN OVALE   N/A   08/03/2019        Procedure: CLOSURE OF PATENT FORAMEN OVALE;  Surgeon: Rexene Alberts, MD;  Location: [facility];  Service: Open Heart Surgery;  Laterality: N/A;    .   RETINAL TEAR REPAIR CRYOTHERAPY   Left   ~ 1997    .   RIGHT/LEFT HEART CATH AND CORONARY ANGIOGRAPHY   N/A   05/26/2019        Procedure: RIGHT/LEFT HEART CATH AND CORONARY ANGIOGRAPHY;  Surgeon: Jettie Booze, MD;  Location: [facility];  Service: Cardiovascular;  Laterality: N/A;    .   TEE WITHOUT CARDIOVERSION   N/A   05/26/2019        Procedure: TRANSESOPHAGEAL ECHOCARDIOGRAM (TEE);  Surgeon: Josue Hector, MD;  Location: [facility];  Service: Cardiovascular;  Laterality: N/A;    .   TEE WITHOUT CARDIOVERSION   N/A   08/03/2019        Procedure: TRANSESOPHAGEAL ECHOCARDIOGRAM (TEE);  Surgeon: Rexene Alberts, MD;  Location: [facility];  Service: Open Heart Surgery;  Laterality: N/A;    .   TONSILLECTOMY       ~ 1952    .   TOTAL KNEE ARTHROPLASTY   Right   09/21/2016        Procedure: RIGHT TOTAL KNEE ARTHROPLASTY;  Surgeon: Gaynelle Arabian, MD;  Location: [facility];  Service: Orthopedics;  Laterality: Right;   requests 67mins  with abductor block    .   VARICOSE VEIN SURGERY   Right   2000's    .   VASCULAR SURGERY            .   WRIST SURGERY   Right   1975        "had a knot taken off" (04/19/2013)                Family History    Problem   Relation   Age of Onset    .   Tremor   Mother        .   Osteoporosis   Mother        .   Sarcoidosis   Mother        .   Hypertension   Mother        .   Congestive Heart Failure   Father        .   Heart attack   Father        .   Diabetes Mellitus II   Father        .   Diabetes Mellitus II   Brother              Social History   Social History             Tobacco Use    .   Smoking status:   Former Smoker            Packs/day:   1.50            Years:   20.00            Pack years:   30.00            Types:   Cigarettes            Quit date:   11/30/1986            Years since quitting:   32.9    .   Smokeless tobacco:   Never Used    Substance Use Topics    .   Alcohol use:   Yes            Comment: RARE    .   Drug use:   No                 Current Outpatient Medications    Medication   Sig   Dispense   Refill    .  acetaminophen (TYLENOL) 500 MG tablet   Take 500-1,000 mg by mouth every 6 (six) hours as needed for moderate pain (depends on pain if takes 1-2 tablets).            [name]   albuterol (PROVENTIL) (2.5 MG/3ML) 0.083% nebulizer solution   Take 2.5 mg by nebulization every 4 (four) hours.             .   Alum Hydroxide-Mag Carbonate (GAVISCON EXTRA STRENGTH) 160-105 MG CHEW   Chew 1 tablet by mouth as needed (indigestion). 2-3 times weekly at bedtime prn            .   amiodarone (PACERONE) 200 MG tablet   Take 0.5 tablets (100 mg total) by mouth daily.   90 tablet   3    .    aspirin EC 81 MG EC tablet   Take 1 tablet (81 mg total) by mouth daily.            .   Biotin 5000 MCG TABS   Take 5,000 mcg by mouth daily.            [name]   buPROPion (WELLBUTRIN XL) 300 MG 24 hr tablet   Take 300 mg by mouth daily.            .   Ca Phosphate-Cholecalciferol (CALCIUM/VITAMIN D3 GUMMIES PO)   Take 2 each by mouth 2 (two) times daily. Alive Woman's over 50            .   docusate sodium (COLACE) 100 MG capsule   Take 100 mg by mouth daily as needed for mild constipation.            .   fluticasone furoate-vilanterol (BREO ELLIPTA) 100-25 MCG/INH AEPB   USE 1 PUFF DAILY AS DIRECTED   60 each   3    .   metoprolol tartrate (LOPRESSOR) 25 MG tablet   TAKE (1/2) TABLET TWICE DAILY.   90 tablet   3    .   montelukast (SINGULAIR) 10 MG tablet   Take 10 mg by mouth at bedtime.             .   Multiple Vitamins-Minerals (MULTIVITAMIN WITH MINERALS) tablet   Take 1 tablet by mouth daily. Master's complete            .   Vitamin D, Ergocalciferol, (DRISDOL) 1.25 MG (50000 UT) CAPS capsule   Take 50,000 Units by mouth 2 (two) times a week. Sun and Thurs            .   warfarin (COUMADIN) 5 MG tablet   Take as directed by the coumadin clinic (Patient taking differently: 5 mg. 5mg  daily except for 7.5mg  MWF)   40 tablet   2    .   cyclobenzaprine (FLEXERIL) 5 MG tablet   Take 10 mg by mouth in the morning.                No current facility-administered medications for this visit.                Allergies    Allergen   Reactions    .   Penicillins   Rash            Has patient had a PCN reaction causing immediate rash, facial/tongue/throat swelling, SOB or lightheadedness with hypotension: No  Has patient had a PCN reaction causing severe rash involving mucus membranes  or skin necrosis: no  Has patient had a PCN reaction that required  hospitalization: No  Has patient had a PCN reaction occurring within the last 10 years: no  If all of the above answers are "NO", then may proceed with Cephalosporin use.        .   Codeine   Other (See Comments)            Possible sensitivity, patient not sure    .   Milk-Related Compounds   Nausea And Vomiting and Other (See Comments)            Can have ice cream, pt just doesn't want to drink milk    .   Morphine And Related                "strange sensation across midsection and chest"          Review of Systems   Constitutional: Negative for activity change and unexpected weight change (Some weight loss postop).   HENT: Negative for trouble swallowing and voice change.    Respiratory: Positive for shortness of breath (Improved post surgery). Negative for wheezing.    Cardiovascular: Positive for palpitations. Negative for chest pain.   Genitourinary: Negative for difficulty urinating and dysuria.   Musculoskeletal: Positive for arthralgias. Negative for myalgias.   Neurological: Positive for headaches. Negative for dizziness and weakness.   Hematological: Bruises/bleeds easily (On Coumadin).   All other systems reviewed and are negative.        BP 125/75 (BP Location: Left Arm, Patient Position: Sitting, Cuff Size: Normal)   Pulse 65   Temp 97.7 F (36.5 C)   Ht 5' 2.25" (1.581 m)   Wt 174 lb (78.9 kg)   SpO2 (!) 87% Comment: RA  BMI 31.57 kg/m   Physical Exam  Vitals reviewed.  Constitutional:       General: She is not in acute distress.    Appearance: She is obese.  HENT:      Head: Normocephalic and atraumatic.  Eyes:      General: No scleral icterus. Neck:      Vascular: No carotid bruit.  Cardiovascular:      Rate and Rhythm: Normal rate and regular rhythm.     Heart sounds: Normal heart sounds. No murmur. No friction rub. No gallop.   Pulmonary:      Effort: Pulmonary effort is normal. No  respiratory distress.     Breath sounds: Normal breath sounds. No wheezing or rales.  Abdominal:     General: There is no distension.     Palpations: Abdomen is soft.     Tenderness: There is no abdominal tenderness.   Musculoskeletal:         General: No swelling.     Cervical back: Neck supple.   Lymphadenopathy:      Cervical: No cervical adenopathy.  Skin:     General: Skin is warm and dry.  Neurological:      General: No focal deficit present.     Mental Status: She is alert and oriented to person, place, and time.     Cranial Nerves: No cranial nerve deficit.     Motor: No weakness.       Diagnostic Tests:  CT CHEST WITHOUT CONTRAST     TECHNIQUE:  Multidetector CT imaging of the chest was performed following the  standard protocol without IV contrast.     COMPARISON:  PET-CT 07/28/2019. Chest CT 07/20/2019.  FINDINGS:  Cardiovascular: The heart size is normal. No substantial pericardial  effusion. Atherosclerotic calcification is noted in the wall of the  thoracic aorta. Patient is status post mitral valve replacement.     Mediastinum/Nodes: No mediastinal lymphadenopathy. No evidence for  gross hilar lymphadenopathy although assessment is limited by the  lack of intravenous contrast on today's study. The esophagus has  normal imaging features. There is no axillary lymphadenopathy.     Lungs/Pleura: Centrilobular emphsyema noted. Scattered tiny  ground-glass opacities are seen in the lung apices bilaterally.     11 mm subpleural lesion along the dome of the right hemidiaphragm  has high attenuation compatible with calcification. This is new  since 07/20/2019 in the rapid appearance is somewhat indeterminate.  Mixed attenuation central left lower lobe lesion measures up to 3.9  cm on today's ex tiny satellite nodules are similar. No pleural  effusion. Am, stable     Upper Abdomen: Stable small subcapsular cysts in the  anterior liver  4.9 cm exophytic cyst upper pole right kidney is similar.     Musculoskeletal: No worrisome lytic or sclerotic osseous  abnormality. Fractures of the anterior right third and fourth ribs  are new in the interval since 07/19/2019 but nonacute.     IMPRESSION:  1. No substantial interval change in the mixed attenuation central  left lower lobe pulmonary lesion with tiny satellite nodules. Lesion  noted to be hypermetabolic on recent PET-CT.  2. 11 mm subpleural lesion along the dome of the right hemidiaphragm  has high attenuation approaching calcium and is new since 07/28/2019  but high density suggests benign etiology. This is not a typical  appearance for aspirated contrast material.  3. Aortic Atherosclerosis (ICD10-I70.0) and Emphysema (ICD10-J43.9).        Electronically Signed    By: Misty Stanley M.D.    On: 10/02/2019 13:08  NUCLEAR MEDICINE PET SKULL BASE TO THIGH     TECHNIQUE:  8.5 mCi F-18 FDG was injected intravenously. Full-ring PET imaging  was performed from the skull base to thigh after the radiotracer. CT  data was obtained and used for attenuation correction and anatomic  localization.     Fasting blood glucose: 103 mg/dl     COMPARISON:  07/20/2019     FINDINGS:  Mediastinal blood pool activity: SUV max 2.8     Liver activity: SUV max NA     NECK: No hypermetabolic lymph nodes in the neck.     Incidental CT findings: none     CHEST: The persistent left lower lobe lung nodule measures 2.5 x 2.0  cm and has an SUV max of 3.47. This is not significantly changed in  size from 06/06/2019 but appears progressively more solid.     Also in the left lower lobe is a 3 mm, too small to characterize  lung nodule, image 43/8.     Incidental CT findings: Centrilobular emphysema. Mild aortic  atherosclerosis.     ABDOMEN/PELVIS: No abnormal hypermetabolic activity within the  liver, pancreas,  adrenal glands, or spleen. No hypermetabolic lymph  nodes in the abdomen or pelvis.     Incidental CT findings: Chronic cysts within right hepatic lobe,  similar to previous exam from 04/04/2013. Aortic atherosclerosis  identified. 4.8 cm cyst arises from upper pole of the right kidney.  Small indeterminate lesion arising from posterior cortex of left  kidney measures 7 mm. This is too small to characterize.     SKELETON: No focal hypermetabolic activity  to suggest skeletal  metastasis.     Incidental CT findings: none     IMPRESSION:  Increased FDG uptake associated with left lower lobe lung nodule has  an SUV max of 3.47. Findings are suspicious for primary bronchogenic  carcinoma, possibly adenocarcinoma. Correlation with tissue sampling  advised.     No FDG avid thoracic lymph nodes or evidence of distant metastatic  disease.     Aortic Atherosclerosis (ICD10-I70.0) and Emphysema (ICD10-J43.9).        Electronically Signed    By: Kerby Moors M.D.    On: 07/28/2019 10:04  I personally reviewed the CT images and also reviewed the PET/CT images again.  I concur with the findings noted above.     Impression:  Anne Hogan is a 74 year old woman with a past medical history significant for mitral valve prolapse, severe mitral regurgitation, congestive heart failure, patent foramen ovale, arthritis, asthma, sleep apnea, reflux, claustrophobia, depression, and palpitations.  She has remote history of tobacco abuse, but quit in 1987.  She was incidentally found to have a mixed solid/subsolid nodule in the left lower lobe on a CT as part of her work-up prior to her mitral valve repair.  The nodule was hypermetabolic on PET/CT.  On follow-up CT there is been little change in the nodule.     The differential diagnosis includes infectious and inflammatory nodules, however, by far the most likely scenario is that this is a low-grade adenocarcinoma.  I  discussed the differential diagnosis as well as options for diagnosis and treatment with Mrs. Porrata and her friend.  Options include continued radiographic follow-up,  bronchoscopy for sampling, and surgical resection.  She understands that possible treatments include surgery and radiation.  We reviewed the relative advantages and disadvantages to each of those approaches.  Given the extremely high index of suspicion, I would recommend proceeding directly to surgical resection.     I described the proposed operation to her.  We would plan to do a robotic left lower lobectomy.  I informed her of the need for general anesthesia, the incisions to be used, the use of a drainage tube postoperatively, the expected hospital stay, and the overall recovery.  I informed her of the indications, risks, benefits, and alternatives.  She understands the risks include, but are not limited to death, MI, DVT, PE, bleeding, possible need for transfusion, infection, prolonged air leak, cardiac arrhythmias, as well as the possibility of other unforeseeable complications.  She understands the degree of pain is variable and unpredictable.     She has been on Coumadin since her surgery.  Their prescription runs out tomorrow.  I do not think there is any reason for her to be on Coumadin beyond 3 months but will check with Dr. Roxy Manns on that.  Her prescriptions for Singulair and Breo Ellipta also run out.  She has refills on those but I recommended she not fill them and then if she has issues with wheezing she could restart those medications.     She understands accepts the risk of surgery and wishes to proceed.     Plan:  Robotic left lower lobectomy on Thursday, 11/23/2019     I spent 45 minutes today in review of records, review of images, and an examination and consultation with Mrs. Abundio Miu, MD  [facility]  [phone]          Electronically signed by  Melrose Nakayama, MD at 11/07/2019  5:36 PM

## 2019-11-24 NOTE — Anesthesia Postprocedure Evaluation (Signed)
Anesthesia Post Note  Patient: Anne Hogan  Procedure(s) Performed: XI ROBOTIC ASSISTED THORASCOPY-LEFT LOWER LOBECTOMY (Left Chest) Intercostal Nerve Block (Left Chest) Lymph Node Dissection (Left Chest)     Patient location during evaluation: PACU Anesthesia Type: General Level of consciousness: awake and alert Pain management: pain level controlled Vital Signs Assessment: post-procedure vital signs reviewed and stable Respiratory status: spontaneous breathing, nonlabored ventilation, respiratory function stable and patient connected to nasal cannula oxygen Cardiovascular status: blood pressure returned to baseline and stable Postop Assessment: no apparent nausea or vomiting Anesthetic complications: no    Last Vitals:  Vitals:   11/24/19 0500 11/24/19 0805  BP: (!) 108/54 (!) 104/57  Pulse: 79 80  Resp: 19 19  Temp: 36.5 C 36.5 C  SpO2: 99% 99%    Last Pain:  Vitals:   11/24/19 0805  TempSrc: Oral  PainSc:                  Effie Berkshire

## 2019-11-24 NOTE — Discharge Instructions (Signed)
[facility] office [phone]   Robot-Assisted Thoracic Surgery, Care After This sheet gives you information about how to care for yourself after your procedure. Your health care provider may also give you more specific instructions. If you have problems or questions, contact your health care provider. What can I expect after the procedure? After the procedure, it is common to have:  Some pain and aches in the area of your surgical cuts (incisions).  Pain when breathing in (inhaling) and coughing.  Tiredness (fatigue).  Trouble sleeping.  Constipation. Follow these instructions at home: Medicines  Take over-the-counter and prescription medicines only as told by your health care provider.  If you were prescribed an antibiotic medicine, take it as told by your health care provider. Do not stop taking the antibiotic even if you start to feel better.  Talk with your health care provider about safe and effective ways to manage pain after your procedure. Pain management should fit your specific health needs.  Take prescription pain medicine before pain becomes severe. Relieving and controlling your pain will make breathing easier for you. Activity  Return to your normal activities as told by your health care provider. Ask your health care provider what activities are safe for you.  Do not lift anything that is heavier than 10 lb (4.5 kg), or the limit that you are told, until your health care provider says that it is safe.  Avoid sitting for a long time without moving. Get up and move around one or more times every few hours. Bathing  Do not take baths, swim, or use a hot tub until your health care provider approves. You may take showers. Incision care  Follow instructions from your health care provider about how to take care of your incision(s). Make sure you: ? Wash your hands with soap and water before you change your bandage (dressing). If soap and water are not available, use hand  sanitizer. ? Change your dressing as told by your health care provider. ? Leave stitches (sutures), skin glue, or adhesive strips in place. These skin closures may need to stay in place for 2 weeks or longer. If adhesive strip edges start to loosen and curl up, you may trim the loose edges. Do not remove adhesive strips completely unless your health care provider tells you to do that.  Check your incision area every day for signs of infection. Check for: ? Redness, swelling, or pain. ? Fluid or blood. ? Warmth. ? Pus or a bad smell. Driving  Ask your health care provider when it is safe for you to drive.  Do not drive or use heavy machinery while taking prescription pain medicine. Eating and drinking  Follow instructions from your health care provider about eating or drinking restrictions. These will vary depending on what procedure you had. Your health care provider may recommend: ? A liquid diet or soft diet for the first few days. ? Meals that are smaller and more frequent. ? A diet of fruits, vegetables, whole grains, and low-fat proteins. ? Limiting foods that are high in processed sugar and fat, including fried and sweet foods. Pneumonia prevention   Do not use any products that contain nicotine or tobacco, such as cigarettes and e-cigarettes. If you need help quitting, ask your health care provider.  Avoid secondhand smoke.  Do deep breathing exercises and cough regularly as directed. This helps to clear mucus and prevent pneumonia. If it hurts to cough, try one of these methods to ease your pain when  you cough: ? Hold a pillow against your chest. ? Place the palms of both hands over your incisions (use splinting).  Use an incentive spirometer as directed. This device measures how much air your lungs are getting with each breath. Using this will improve your breathing.  Do pulmonary rehabilitation as directed. This is a program that includes exercise, education, and  support. General instructions  Wear compression stockings as told by your health care provider. These stockings help to prevent blood clots and reduce swelling in your legs.  If you have a drainage tube: ? Follow instructions from your health care provider about how to take care of it. ? Do not travel by airplane after your tube is removed until your health care provider tells you it is safe.  To prevent or treat constipation while you are taking prescription pain medicine, your health care provider may recommend that you: ? Drink enough fluid to keep your urine pale yellow. ? Take over-the-counter or prescription medicines. ? Eat foods that are high in fiber, such as fresh fruits and vegetables, whole grains, and beans. ? Limit foods that are high in fat and processed sugars, such as fried and sweet foods.  Keep all follow-up visits as told by your health care provider. This is important. Contact a health care provider if:  You have redness, swelling, or pain around an incision.  You have fluid or blood coming from an incision.  An incision feels warm to the touch.  You have pus or a bad smell coming from an incision.  You have a fever.  You cannot eat or drink without vomiting.  Your prescription pain medicine is not controlling your pain. Get help right away if:  You have chest pain.  Your heart is beating quickly.  You have trouble breathing.  You have trouble speaking.  You are confused.  You feel weak or dizzy, or you faint. These symptoms may represent a serious problem that is an emergency. Do not wait to see if the symptoms will go away. Get medical help right away. Call your local emergency services (911 in the [place] Do not drive yourself to the hospital. Summary  Talk with your health care provider about safe and effective ways to manage pain after your procedure. Pain management should fit your specific health needs.  Return to your normal activities as  told by your health care provider. Ask your health care provider what activities are safe for you.  Do deep breathing exercises and cough regularly as directed. This helps to clear mucus and prevent pneumonia. If it hurts to cough, ease pain by holding a pillow against your chest or by placing the palms of both hands over your incisions (splinting). This information is not intended to replace advice given to you by your health care provider. Make sure you discuss any questions you have with your health care provider. Document Revised: 05/05/2019 Document Reviewed: 11/09/2016 Elsevier Patient Education  [place].

## 2019-11-24 NOTE — Plan of Care (Signed)
  Problem: Education: Goal: Knowledge of General Education information will improve Description: Including pain rating scale, medication(s)/side effects and non-pharmacologic comfort measures Outcome: Progressing   Problem: Clinical Measurements: Goal: Ability to maintain clinical measurements within normal limits will improve Outcome: Progressing Goal: Will remain free from infection Outcome: Progressing   Problem: Activity: Goal: Risk for activity intolerance will decrease Outcome: Progressing   Problem: Coping: Goal: Level of anxiety will decrease Outcome: Progressing

## 2019-11-24 NOTE — Plan of Care (Signed)

## 2019-11-24 NOTE — Progress Notes (Addendum)
[address]       [place] [address]             [phone]      1 Day Post-Op Procedure(s) (LRB): XI ROBOTIC ASSISTED THORASCOPY-LEFT LOWER LOBECTOMY (Left) Intercostal Nerve Block (Left) Lymph Node Dissection (Left) Subjective: Feels some anxiety, c/o new tremor  Objective: Vital signs in last 24 hours: Temp:  [96.9 F (36.1 C)-98.4 F (36.9 C)] 97.7 F (36.5 C) (05/07 0805) Pulse Rate:  [57-84] 80 (05/07 0805) Cardiac Rhythm: Normal sinus rhythm (05/07 0740) Resp:  [11-24] 19 (05/07 0805) BP: (104-138)/(54-78) 104/57 (05/07 0805) SpO2:  [92 %-100 %] 99 % (05/07 0805) Arterial Line BP: (122-158)/(58-71) 142/71 (05/06 2000)  Hemodynamic parameters for last 24 hours:    Intake/Output from previous day: 05/06 0701 - 05/07 0700 In: 1790 [P.O.:240; I.V.:1200; IV Piggyback:350] Out: 2143 [Urine:1955; Blood:50; Chest Tube:138] Intake/Output this shift: No intake/output data recorded.  General appearance: alert, cooperative and no distress Heart: regular rate and rhythm Lungs: clear to auscultation bilaterally Abdomen: benign Extremities: no edema Wound: incis healing well  Lab Results: Recent Labs    11/24/19 0522  WBC 8.7  HGB 11.0*  HCT 35.6*  PLT 271   BMET:  Recent Labs    11/24/19 0522  NA 139  K 4.2  CL 110  CO2 24  GLUCOSE 110*  BUN 9  CREATININE 1.11*  CALCIUM 8.0*    PT/INR: No results for input(s): LABPROT, INR in the last 72 hours. ABG    Component Value Date/Time   PHART 7.436 11/20/2019 0858   HCO3 20.5 11/20/2019 0858   TCO2 29 08/05/2019 2035   ACIDBASEDEF 3.0 (H) 11/20/2019 0858   O2SAT 98.9 11/20/2019 0858   CBG (last 3)  No results for input(s): GLUCAP in the last 72 hours.  Meds Scheduled Meds: . acetaminophen  1,000 mg Oral Q6H   Or  . acetaminophen (TYLENOL) oral liquid 160 mg/5 mL  1,000 mg Oral Q6H  . aspirin EC  81 mg Oral Daily  . bisacodyl  10 mg Oral Daily  . buPROPion  300 mg Oral  Daily  . Chlorhexidine Gluconate Cloth  6 each Topical Daily  . enoxaparin (LOVENOX) injection  40 mg Subcutaneous Daily  . pantoprazole  40 mg Oral Daily  . senna-docusate  1 tablet Oral QHS   Continuous Infusions: . sodium chloride    . sodium chloride 100 mL/hr at 11/24/19 0519   PRN Meds:.Place/Maintain arterial line **AND** sodium chloride, albuterol, ketorolac, ondansetron (ZOFRAN) IV, traMADol  Xrays DG Chest Port 1 View  Result Date: 11/23/2019 CLINICAL DATA:  Status post left upper lobectomy. EXAM: PORTABLE CHEST 1 VIEW COMPARISON:  Nov 20, 2019. FINDINGS: The heart size and mediastinal contours are within normal limits. Left-sided chest tube is noted without pneumothorax. Right internal jugular catheter is noted. Minimal bibasilar subsegmental atelectasis is noted. Status post cardiac valve repair. The visualized skeletal structures are unremarkable. IMPRESSION: Left-sided chest tube is noted without pneumothorax. Minimal bibasilar subsegmental atelectasis is noted. Electronically Signed   By: Marijo Conception M.D.   On: 11/23/2019 12:46    Assessment/Plan: S/P Procedure(s) (LRB): XI ROBOTIC ASSISTED THORASCOPY-LEFT LOWER LOBECTOMY (Left) Intercostal Nerve Block (Left) Lymph Node Dissection (Left)   1 doing well POD # 1 2 afebrile, VSS  3 sats good on RA 4 CXR no pntx, min atx 5 chest tube - no air leak, minor drainage- will d/c tube 6 d/c IVF 7  d/c foley 8 minor ABL anemia- monitor 9 normal renal fxn 10 routine pulm toilet and rehab 11 poss home 1-2 days   LOS: 1 day    John Giovanni PA-C Pager [phone] 11/24/2019 Patient seen and examined, agree with above Will dc chest tube and central line Her abdomen is benign. Pneumoperitoneum due to CO2 insufflation around assistant port. There is no evidence of intraabdominal pathology and no need for further investigation unless symptoms or signs warrant it  Remo Lipps C. Roxan Hockey, MD [facility]  [phone]

## 2019-11-25 ENCOUNTER — Inpatient Hospital Stay (HOSPITAL_COMMUNITY): Payer: PPO

## 2019-11-25 LAB — COMPREHENSIVE METABOLIC PANEL
ALT: 44 U/L (ref 0–44)
AST: 50 U/L — ABNORMAL HIGH (ref 15–41)
Albumin: 2.9 g/dL — ABNORMAL LOW (ref 3.5–5.0)
Alkaline Phosphatase: 40 U/L (ref 38–126)
Anion gap: 9 (ref 5–15)
BUN: 11 mg/dL (ref 8–23)
CO2: 23 mmol/L (ref 22–32)
Calcium: 8.3 mg/dL — ABNORMAL LOW (ref 8.9–10.3)
Chloride: 108 mmol/L (ref 98–111)
Creatinine, Ser: 0.99 mg/dL (ref 0.44–1.00)
GFR calc Af Amer: >60 mL/min (ref >60)
GFR calc non Af Amer: 57 mL/min — ABNORMAL LOW (ref >60)
Glucose, Bld: 86 mg/dL (ref 70–99)
Potassium: 4 mmol/L (ref 3.5–5.1)
Sodium: 140 mmol/L (ref 135–145)
Total Bilirubin: 0.9 mg/dL (ref 0.3–1.2)
Total Protein: 5.5 g/dL — ABNORMAL LOW (ref 6.5–8.1)

## 2019-11-25 LAB — CBC
HCT: 34.3 % — ABNORMAL LOW (ref 36.0–46.0)
Hemoglobin: 10.7 g/dL — ABNORMAL LOW (ref 12.0–15.0)
MCH: 28.2 pg (ref 26.0–34.0)
MCHC: 31.2 g/dL (ref 30.0–36.0)
MCV: 90.3 fL (ref 80.0–100.0)
Platelets: 254 10*3/uL (ref 150–400)
RBC: 3.8 MIL/uL — ABNORMAL LOW (ref 3.87–5.11)
RDW: 14.4 % (ref 11.5–15.5)
WBC: 7.4 10*3/uL (ref 4.0–10.5)
nRBC: 0 % (ref 0.0–0.2)

## 2019-11-25 NOTE — Plan of Care (Signed)

## 2019-11-25 NOTE — Progress Notes (Signed)
Patient refused to go home today when she talked to doctors. She wasn't sure her condition is okay to go home. Dr. Servando Snare and Wall said that patient may discharge tomorrow. Ambulated the hall way with several times stops for SOB. Patient wanted to go home now. Talked PA [place] regarding this and it is better to go home tomorrow. Explained patient about this and she agreed with to stay in the hospital one more night. HS [employer]

## 2019-11-25 NOTE — Progress Notes (Signed)
[address]       [place] [address]             [phone]      2 Days Post-Op Procedure(s) (LRB): XI ROBOTIC ASSISTED THORASCOPY-LEFT LOWER LOBECTOMY (Left) Intercostal Nerve Block (Left) Lymph Node Dissection (Left) Subjective: Feels fine, a little anxious at times  Objective: Vital signs in last 24 hours: Temp:  [97.8 F (36.6 C)-98.6 F (37 C)] 98.6 F (37 C) (05/08 1150) Pulse Rate:  [73-88] 88 (05/08 1150) Cardiac Rhythm: Normal sinus rhythm (05/08 1150) Resp:  [12-20] 20 (05/08 1150) BP: (106-141)/(49-75) 111/72 (05/08 1150) SpO2:  [92 %-100 %] 98 % (05/08 1150)  Hemodynamic parameters for last 24 hours:    Intake/Output from previous day: 05/07 0701 - 05/08 0700 In: -  Out: 28 [Chest Tube:28] Intake/Output this shift: No intake/output data recorded.  General appearance: alert, cooperative and no distress Heart: regular rate and rhythm Lungs: clear to auscultation bilaterally Abdomen: benign Extremities: no edema Wound: incis healing well  Lab Results: Recent Labs    11/24/19 0522 11/25/19 0211  WBC 8.7 7.4  HGB 11.0* 10.7*  HCT 35.6* 34.3*  PLT 271 254   BMET:  Recent Labs    11/24/19 0522 11/25/19 0211  NA 139 140  K 4.2 4.0  CL 110 108  CO2 24 23  GLUCOSE 110* 86  BUN 9 11  CREATININE 1.11* 0.99  CALCIUM 8.0* 8.3*    PT/INR: No results for input(s): LABPROT, INR in the last 72 hours. ABG    Component Value Date/Time   PHART 7.436 11/20/2019 0858   HCO3 20.5 11/20/2019 0858   TCO2 29 08/05/2019 2035   ACIDBASEDEF 3.0 (H) 11/20/2019 0858   O2SAT 98.9 11/20/2019 0858   CBG (last 3)  No results for input(s): GLUCAP in the last 72 hours.  Meds Scheduled Meds: . acetaminophen  1,000 mg Oral Q6H   Or  . acetaminophen (TYLENOL) oral liquid 160 mg/5 mL  1,000 mg Oral Q6H  . aspirin EC  81 mg Oral Daily  . bisacodyl  10 mg Oral Daily  . buPROPion  300 mg Oral Daily  . Chlorhexidine Gluconate Cloth  6  each Topical Daily  . enoxaparin (LOVENOX) injection  40 mg Subcutaneous Daily  . pantoprazole  40 mg Oral Daily  . senna-docusate  1 tablet Oral QHS   Continuous Infusions: PRN Meds:.albuterol, ketorolac, ondansetron (ZOFRAN) IV, traMADol  Xrays DG Chest 2 View  Result Date: 11/25/2019 CLINICAL DATA:  Surgery follow-up examination. Additional provided: Left lower lobectomy 2 days ago. EXAM: CHEST - 2 VIEW COMPARISON:  Prior chest radiographs 11/24/2019 and earlier FINDINGS: Interval removal of a previously demonstrated left-sided chest tube. There is a small amount of subcutaneous gas within the left chest wall. Interval removal of a previously demonstrated right IJ approach central venous catheter. As before, there is questionable pneumoperitoneum. Cardiomediastinal silhouette unchanged. Prior mitral valve replacement. Similar appearance of ill-defined opacity at the left lung base which may reflect postsurgical changes and/or atelectasis. The lungs are otherwise clear. No definite pneumothorax. IMPRESSION: 1. Interval removal of previously demonstrated left-sided chest tube and right IJ approach central venous catheter. No definite pneumothorax. 2. Pneumoperitoneum is again questioned beneath the left hemidiaphragm. This is similar in appearance as compared to prior examination 11/24/2019. 3. Unchanged ill-defined opacity within the left lung base which may reflect postsurgical changes and/or atelectasis. 4. Lungs otherwise clear. Electronically Signed   By: Marylyn Ishihara  Golden DO   On: 11/25/2019 09:53   DG CHEST PORT 1 VIEW  Result Date: 11/24/2019 CLINICAL DATA:  Postoperative lobectomy on the left with chest tube in place. EXAM: PORTABLE CHEST 1 VIEW COMPARISON:  Nov 23, 2019 FINDINGS: Chest tube remains on the left. Central catheter tip is in the superior vena cava. No pneumothorax. Subcutaneous air on the left appears slightly increased overall compared to 1 day prior. There is questionable  pneumoperitoneum. There is no edema or airspace opacity. Heart is upper normal in size with pulmonary vascularity normal. Patient is status post mitral valve replacement. No adenopathy. IMPRESSION: 1. Question of pneumoperitoneum. This finding warrants further evaluation. Left lateral decubitus abdomen radiograph could be helpful in this regard. CT of the abdomen and pelvis also could be helpful in this regard. 2. Tube and catheter positions as described. No pneumothorax. There is increase in subcutaneous air on the left, however. 3.  No edema or airspace opacity. 4.  Stable cardiac silhouette. These results will be called to the ordering clinician or representative by the Radiologist Assistant, and communication documented in the PACS or [employer]. Electronically Signed   By: Lowella Grip III M.D.   On: 11/24/2019 08:33   DG Chest Port 1 View  Result Date: 11/23/2019 CLINICAL DATA:  Status post left upper lobectomy. EXAM: PORTABLE CHEST 1 VIEW COMPARISON:  Nov 20, 2019. FINDINGS: The heart size and mediastinal contours are within normal limits. Left-sided chest tube is noted without pneumothorax. Right internal jugular catheter is noted. Minimal bibasilar subsegmental atelectasis is noted. Status post cardiac valve repair. The visualized skeletal structures are unremarkable. IMPRESSION: Left-sided chest tube is noted without pneumothorax. Minimal bibasilar subsegmental atelectasis is noted. Electronically Signed   By: Marijo Conception M.D.   On: 11/23/2019 12:46    Assessment/Plan: S/P Procedure(s) (LRB): XI ROBOTIC ASSISTED THORASCOPY-LEFT LOWER LOBECTOMY (Left) Intercostal Nerve Block (Left) Lymph Node Dissection (Left)  1 conts to do well 2 hemodyn stable in sinus rhythm, afebrile, sinus rhythm 3 sats good on RA 4 CXR stable 5 labs stable     LOS: 2 days    John Giovanni PA-C Pager [phone] 11/25/2019

## 2019-11-26 MED ORDER — TRAMADOL HCL 50 MG PO TABS: 50.0000 mg | ORAL_TABLET | Freq: Four times a day (QID) | ORAL | 0 refills | Status: AC | PRN

## 2019-11-26 NOTE — Progress Notes (Signed)
Patient was feeling better than yesterday. Removed PIV access and patient received discharge instructions. Pt took her all belongings. HS [employer]

## 2019-11-26 NOTE — Progress Notes (Signed)
[address]       [place] [address]             [phone]      3 Days Post-Op Procedure(s) (LRB): XI ROBOTIC ASSISTED THORASCOPY-LEFT LOWER LOBECTOMY (Left) Intercostal Nerve Block (Left) Lymph Node Dissection (Left) Subjective: Doing well, very happy she stayed the night as was very nervous about leaving yesterday  Objective: Vital signs in last 24 hours: Temp:  [97.8 F (36.6 C)-98.6 F (37 C)] 98.6 F (37 C) (05/09 0409) Pulse Rate:  [78-91] 88 (05/09 0409) Cardiac Rhythm: Normal sinus rhythm (05/09 0409) Resp:  [16-20] 19 (05/09 0409) BP: (102-132)/(49-74) 130/74 (05/09 0409) SpO2:  [94 %-100 %] 98 % (05/09 0409)  Hemodynamic parameters for last 24 hours:    Intake/Output from previous day: 05/08 0701 - 05/09 0700 In: 360 [P.O.:360] Out: -  Intake/Output this shift: No intake/output data recorded.  General appearance: alert, cooperative and no distress Heart: regular rate and rhythm Lungs: clear to auscultation bilaterally Abdomen: benign Extremities: no edema or calf tenderness Wound: incis healing well  Lab Results: Recent Labs    11/24/19 0522 11/25/19 0211  WBC 8.7 7.4  HGB 11.0* 10.7*  HCT 35.6* 34.3*  PLT 271 254   BMET:  Recent Labs    11/24/19 0522 11/25/19 0211  NA 139 140  K 4.2 4.0  CL 110 108  CO2 24 23  GLUCOSE 110* 86  BUN 9 11  CREATININE 1.11* 0.99  CALCIUM 8.0* 8.3*    PT/INR: No results for input(s): LABPROT, INR in the last 72 hours. ABG    Component Value Date/Time   PHART 7.436 11/20/2019 0858   HCO3 20.5 11/20/2019 0858   TCO2 29 08/05/2019 2035   ACIDBASEDEF 3.0 (H) 11/20/2019 0858   O2SAT 98.9 11/20/2019 0858   CBG (last 3)  No results for input(s): GLUCAP in the last 72 hours.  Meds Scheduled Meds: . acetaminophen  1,000 mg Oral Q6H   Or  . acetaminophen (TYLENOL) oral liquid 160 mg/5 mL  1,000 mg Oral Q6H  . aspirin EC  81 mg Oral Daily  . bisacodyl  10 mg Oral Daily  .  buPROPion  300 mg Oral Daily  . Chlorhexidine Gluconate Cloth  6 each Topical Daily  . enoxaparin (LOVENOX) injection  40 mg Subcutaneous Daily  . pantoprazole  40 mg Oral Daily  . senna-docusate  1 tablet Oral QHS   Continuous Infusions: PRN Meds:.albuterol, ondansetron (ZOFRAN) IV, traMADol  Xrays DG Chest 2 View  Result Date: 11/25/2019 CLINICAL DATA:  Surgery follow-up examination. Additional provided: Left lower lobectomy 2 days ago. EXAM: CHEST - 2 VIEW COMPARISON:  Prior chest radiographs 11/24/2019 and earlier FINDINGS: Interval removal of a previously demonstrated left-sided chest tube. There is a small amount of subcutaneous gas within the left chest wall. Interval removal of a previously demonstrated right IJ approach central venous catheter. As before, there is questionable pneumoperitoneum. Cardiomediastinal silhouette unchanged. Prior mitral valve replacement. Similar appearance of ill-defined opacity at the left lung base which may reflect postsurgical changes and/or atelectasis. The lungs are otherwise clear. No definite pneumothorax. IMPRESSION: 1. Interval removal of previously demonstrated left-sided chest tube and right IJ approach central venous catheter. No definite pneumothorax. 2. Pneumoperitoneum is again questioned beneath the left hemidiaphragm. This is similar in appearance as compared to prior examination 11/24/2019. 3. Unchanged ill-defined opacity within the left lung base which may reflect postsurgical changes and/or atelectasis. 4. Lungs  otherwise clear. Electronically Signed   By: Kellie Simmering DO   On: 11/25/2019 09:53    Assessment/Plan: S/P Procedure(s) (LRB): XI ROBOTIC ASSISTED THORASCOPY-LEFT LOWER LOBECTOMY (Left) Intercostal Nerve Block (Left) Lymph Node Dissection (Left)  1 doing well, stable for discharge   LOS: 3 days    John Giovanni PA-C Pager [phone] 11/26/2019

## 2019-11-27 ENCOUNTER — Telehealth: Payer: Self-pay

## 2019-11-27 ENCOUNTER — Encounter (HOSPITAL_COMMUNITY): Payer: PPO

## 2019-11-27 LAB — SURGICAL PATHOLOGY

## 2019-11-27 NOTE — Telephone Encounter (Signed)
Pt calls office to report that one of her steri-strips has almost come off after showering this morning. She is s/p robotic assisted thorascopy/L lower lobectomy by Dr. Roxan Hockey on 11/23/19. Pt reports that her friend is a Marine scientist and is wondering if she can reapply a new steri-strip to the incision since pt was advised to leave them in place for 7 days post-op. Pt states the incision is well approximated and shows no s/s of infection; however, her friend thinks it could easily pull apart if steri-strip isn't reapplied. Advised that it is okay for her friend Investment banker, corporate) to apply a new one to clean, dry incision site. Instructed to report any s/s of infection or other problems to [facility]. Pt has f/u w/ Dr. Roxan Hockey on 12/12/19.

## 2019-11-29 ENCOUNTER — Encounter (HOSPITAL_COMMUNITY): Payer: PPO

## 2019-12-01 ENCOUNTER — Encounter (HOSPITAL_COMMUNITY): Payer: PPO

## 2019-12-03 ENCOUNTER — Other Ambulatory Visit: Payer: Self-pay | Admitting: Pulmonary Disease

## 2019-12-03 DIAGNOSIS — R0600 Dyspnea, unspecified: Secondary | ICD-10-CM

## 2019-12-04 ENCOUNTER — Encounter (HOSPITAL_COMMUNITY): Payer: PPO

## 2019-12-06 ENCOUNTER — Encounter (HOSPITAL_COMMUNITY): Payer: PPO

## 2019-12-07 ENCOUNTER — Other Ambulatory Visit: Payer: Self-pay | Admitting: *Deleted

## 2019-12-07 NOTE — Progress Notes (Signed)
The proposed treatment discussed in cancer conference 12/07/19 is for discussion purpose only and is not a binding recommendation.  The patient was not physically examined nor present for their treatment options.  Therefore, final treatment plans cannot be decided.  

## 2019-12-08 ENCOUNTER — Encounter (HOSPITAL_COMMUNITY): Payer: PPO

## 2019-12-11 ENCOUNTER — Other Ambulatory Visit: Payer: Self-pay | Admitting: Thoracic Surgery (Cardiothoracic Vascular Surgery)

## 2019-12-11 ENCOUNTER — Encounter (HOSPITAL_COMMUNITY): Payer: PPO

## 2019-12-11 DIAGNOSIS — Z902 Acquired absence of lung [part of]: Secondary | ICD-10-CM

## 2019-12-12 ENCOUNTER — Other Ambulatory Visit: Payer: Self-pay

## 2019-12-12 ENCOUNTER — Encounter: Payer: Self-pay | Admitting: Thoracic Surgery (Cardiothoracic Vascular Surgery)

## 2019-12-12 ENCOUNTER — Encounter: Payer: Self-pay | Admitting: *Deleted

## 2019-12-12 ENCOUNTER — Ambulatory Visit
Admission: RE | Admit: 2019-12-12 | Discharge: 2019-12-12 | Disposition: A | Discharge status: Home / Self Care | Payer: PPO | Source: Ambulatory Visit | Attending: Thoracic Surgery (Cardiothoracic Vascular Surgery) | Admitting: Thoracic Surgery (Cardiothoracic Vascular Surgery)

## 2019-12-12 ENCOUNTER — Ambulatory Visit (INDEPENDENT_AMBULATORY_CARE_PROVIDER_SITE_OTHER): Payer: Self-pay | Admitting: Thoracic Surgery (Cardiothoracic Vascular Surgery)

## 2019-12-12 VITALS — BP 124/76 | HR 100 | Temp 97.7°F | Resp 20 | Ht 62.25 in | Wt 170.0 lb

## 2019-12-12 DIAGNOSIS — R918 Other nonspecific abnormal finding of lung field: Secondary | ICD-10-CM | POA: Diagnosis not present

## 2019-12-12 DIAGNOSIS — Z902 Acquired absence of lung [part of]: Secondary | ICD-10-CM

## 2019-12-12 DIAGNOSIS — Z09 Encounter for follow-up examination after completed treatment for conditions other than malignant neoplasm: Secondary | ICD-10-CM

## 2019-12-12 NOTE — Progress Notes (Signed)
Anne Hogan,Anne Hogan             [phone]     HPI: Anne Hogan returns for a scheduled follow-up visit  Anne Hogan is a 74 year old woman with a history of mitral prolapse, severe mitral regurgitation, congestive heart failure, patent foramen ovale, arthritis, asthma, sleep apnea, reflux, claustrophobia, depression, and palpitations.  She is a lifelong non-smoker.  She presented with worsening shortness of breath.  She was found to have mitral valve prolapse and regurgitation.  During her preoperative evaluation she had a CT which showed a left lower lobe lung nodule.  On PET CT the nodule was hypermetabolic with an SUV of 2.09.  She went ahead and had a mitral valve repair by Dr. Roxy Manns.  That went smoothly.  I did a robotic assisted left lower lobectomy on 11/23/2019.  Her postoperative course went smoothly and she went home on day 3.  Since discharge she has been doing well.  She does have a cough particularly when she talks.  She has some incisional discomfort but has not taken any narcotics in the past week.  She has been walking about 5 minutes at a time once or twice a day.  Past Medical History:  Diagnosis Date  . ADD (attention deficit disorder)   . Arthritis    "right knee" (04/19/2013)  . Asthma 04/03/2019  . CHF (congestive heart failure) ([place])   . Chronic diastolic congestive heart failure ([place])   . Claustrophobia   . Depression   . Dyspnea   . Dysrhythmia   . Fracture of right tibial plateau 05/2016  . GERD (gastroesophageal reflux disease)   . Heart murmur   . Incidental pulmonary nodule, greater than or equal to 19mm 06/06/2019   Needs f/u CT in 3 months  . Migraine    "bad when I was in 5th or 6th grade; I can usually ward them off by resting my head now" (04/19/2013)  . MVP (mitral valve prolapse)   . S/P minimally-invasive mitral valve repair 08/03/2019   Complex valvuloplasty including artificial Gore-tex neochords x12 and  Sorin Memo 4D ring annuloplasty, size 38 mm  . S/P patent foramen ovale closure 08/03/2019  . Sleep apnea    has not used mouth guard in 5 years no cpap used   . Squamous carcinoma ?1999   "upper left groin" (04/19/2013)    Current Outpatient Medications  Medication Sig Dispense Refill  . acetaminophen (TYLENOL) 500 MG tablet Take 500-1,000 mg by mouth every 6 (six) hours as needed for moderate pain (pain).     [name] albuterol (PROVENTIL) (2.5 MG/3ML) 0.083% nebulizer solution Take 2.5 mg by nebulization every 4 (four) hours as needed for wheezing or shortness of breath.     . Alum Hydroxide-Mag Carbonate (GAVISCON EXTRA STRENGTH) 160-105 MG CHEW Chew 1 tablet by mouth 2 (two) times daily as needed (indigestion).     [name] aspirin EC 81 MG EC tablet Take 1 tablet (81 mg total) by mouth daily.    . Biotin 5000 MCG TABS Take 5,000 mcg by mouth daily.    [name] BREO ELLIPTA 100-25 MCG/INH AEPB USE 1 PUFF DAILY AS DIRECTED 60 each 0  . buPROPion (WELLBUTRIN XL) 300 MG 24 hr tablet Take 300 mg by mouth daily.    . Ca Phosphate-Cholecalciferol (CALCIUM/VITAMIN D3 GUMMIES PO) Take 2 tablets by mouth daily.     [name] docusate sodium (COLACE) 100  MG capsule Take 100 mg by mouth daily as needed for mild constipation.    . montelukast (SINGULAIR) 10 MG tablet Take 10 mg by mouth daily as needed (respiratory issues.).    [name] Multiple Vitamins-Minerals (MULTIVITAMIN WITH MINERALS) tablet Take 1 tablet by mouth daily. [employer] Women's Masters 92+    . Vitamin D, Ergocalciferol, (DRISDOL) 1.25 MG (50000 UT) CAPS capsule Take 50,000 Units by mouth 2 (two) times a week. Sundays & Thursdays.     No current facility-administered medications for this visit.    Physical Exam BP 124/76   Pulse 100   Temp 97.7 F (36.5 C) (Skin)   Resp 20   Ht 5' 2.25" (1.581 m)   Wt 170 lb (77.1 kg)   SpO2 97% Comment: RA  BMI 30.69 kg/m  74 year old woman in no acute distress Alert and oriented x3 with no focal deficits Lungs  diminished breath sounds at left base but otherwise clear Cardiac regular rate and rhythm no murmur Incisions healing well No peripheral edema  Diagnostic Tests: CHEST - 2 VIEW  COMPARISON:  11/25/2019  FINDINGS: Postsurgical changes are again seen. Cardiac shadow is stable. Aortic calcifications are noted. Mild volume loss on the left is noted related to the prior surgery. No focal infiltrate or sizable effusion is seen. No bony abnormality is noted.  IMPRESSION: No active cardiopulmonary disease.   Electronically Signed   By: Inez Catalina M.D.   On: 12/12/2019 12:26 I personally reviewed the chest x-ray images and concur with the findings noted above  Impression: Anne Hogan is a 74 year old woman who is a lifelong non-smoker.  She was found to have a left lower lobe lung nodule on a CT of the chest done prior to her mitral valve repair.  That nodule was hypermetabolic on PET/CT.  After recovering from her mitral valve repair I did a robotic left lower lobectomy on 11/23/2019.  Postoperative course was unremarkable and she went home on day 3.  She is doing extremely well currently.  She is not taking any narcotics and has not for the past week.  She has been relatively limited in her walking and I encouraged her to do more of that.  She may begin driving.  Appropriate precautions were discussed.  She can gradually build into other activities over the next several weeks.  She does have a stage Ia adenocarcinoma.  She will need an oncology consultation.  We will arrange for her to see Dr. Julien Nordmann in our multidisciplinary clinic.  Plan: Referral to Dr. Julien Nordmann at [facility] Return in 3 weeks with PA and lateral chest x-ray  Anne Nakayama, MD [facility] [phone]

## 2019-12-12 NOTE — Progress Notes (Signed)
I received referral from Dr. Roxan Hockey today.  Stage IA will need to see med onc.  I updated new patient coordinator to call and schedule her to be seen with Cassie on 12/25/19.

## 2019-12-13 ENCOUNTER — Encounter (HOSPITAL_COMMUNITY): Payer: PPO

## 2019-12-13 ENCOUNTER — Telehealth: Payer: Self-pay | Admitting: Physician Assistant

## 2019-12-13 NOTE — Telephone Encounter (Signed)
Received a new pt referral from Dr. Roxan Hockey for lung cancer. Anne Hogan has been cld and scheduled to see Cassie on 6/7 at 130pm w/labs at 1pm. Pt aware to arrive 15 minutes early.

## 2019-12-15 ENCOUNTER — Encounter (HOSPITAL_COMMUNITY): Payer: PPO

## 2019-12-20 ENCOUNTER — Encounter (HOSPITAL_COMMUNITY): Payer: PPO

## 2019-12-22 ENCOUNTER — Encounter (HOSPITAL_COMMUNITY): Payer: PPO

## 2019-12-25 ENCOUNTER — Inpatient Hospital Stay: Payer: PPO

## 2019-12-25 ENCOUNTER — Inpatient Hospital Stay: Payer: PPO | Attending: Physician Assistant | Admitting: Physician Assistant

## 2019-12-25 ENCOUNTER — Encounter (HOSPITAL_COMMUNITY): Payer: PPO

## 2019-12-25 ENCOUNTER — Other Ambulatory Visit: Payer: Self-pay

## 2019-12-25 ENCOUNTER — Encounter: Payer: Self-pay | Admitting: Physician Assistant

## 2019-12-25 VITALS — BP 123/75 | HR 100 | Temp 97.5°F | Resp 20 | Wt 169.6 lb

## 2019-12-25 DIAGNOSIS — I2510 Atherosclerotic heart disease of native coronary artery without angina pectoris: Secondary | ICD-10-CM

## 2019-12-25 DIAGNOSIS — F329 Major depressive disorder, single episode, unspecified: Secondary | ICD-10-CM | POA: Diagnosis not present

## 2019-12-25 DIAGNOSIS — C3432 Malignant neoplasm of lower lobe, left bronchus or lung: Secondary | ICD-10-CM

## 2019-12-25 DIAGNOSIS — I341 Nonrheumatic mitral (valve) prolapse: Secondary | ICD-10-CM | POA: Diagnosis not present

## 2019-12-25 DIAGNOSIS — Z902 Acquired absence of lung [part of]: Secondary | ICD-10-CM | POA: Diagnosis not present

## 2019-12-25 DIAGNOSIS — Z79899 Other long term (current) drug therapy: Secondary | ICD-10-CM | POA: Insufficient documentation

## 2019-12-25 DIAGNOSIS — Z87891 Personal history of nicotine dependence: Secondary | ICD-10-CM | POA: Diagnosis not present

## 2019-12-25 DIAGNOSIS — C3492 Malignant neoplasm of unspecified part of left bronchus or lung: Secondary | ICD-10-CM | POA: Insufficient documentation

## 2019-12-25 LAB — CBC WITH DIFFERENTIAL (CANCER CENTER ONLY)
Abs Immature Granulocytes: 0.02 10*3/uL (ref 0.00–0.07)
Basophils Absolute: 0.1 10*3/uL (ref 0.0–0.1)
Basophils Relative: 1 %
Eosinophils Absolute: 0.3 10*3/uL (ref 0.0–0.5)
Eosinophils Relative: 5 %
HCT: 41.2 % (ref 36.0–46.0)
Hemoglobin: 13.1 g/dL (ref 12.0–15.0)
Immature Granulocytes: 0 %
Lymphocytes Relative: 18 %
Lymphs Abs: 1.2 10*3/uL (ref 0.7–4.0)
MCH: 28.4 pg (ref 26.0–34.0)
MCHC: 31.8 g/dL (ref 30.0–36.0)
MCV: 89.4 fL (ref 80.0–100.0)
Monocytes Absolute: 0.9 10*3/uL (ref 0.1–1.0)
Monocytes Relative: 14 %
Neutro Abs: 3.9 10*3/uL (ref 1.7–7.7)
Neutrophils Relative %: 62 %
Platelet Count: 357 10*3/uL (ref 150–400)
RBC: 4.61 MIL/uL (ref 3.87–5.11)
RDW: 14.3 % (ref 11.5–15.5)
WBC Count: 6.5 10*3/uL (ref 4.0–10.5)
nRBC: 0 % (ref 0.0–0.2)

## 2019-12-25 LAB — CMP (CANCER CENTER ONLY)
ALT: 11 U/L (ref 0–44)
AST: 14 U/L — ABNORMAL LOW (ref 15–41)
Albumin: 3.6 g/dL (ref 3.5–5.0)
Alkaline Phosphatase: 88 U/L (ref 38–126)
Anion gap: 9 (ref 5–15)
BUN: 14 mg/dL (ref 8–23)
CO2: 25 mmol/L (ref 22–32)
Calcium: 9.4 mg/dL (ref 8.9–10.3)
Chloride: 107 mmol/L (ref 98–111)
Creatinine: 1.28 mg/dL — ABNORMAL HIGH (ref 0.44–1.00)
GFR, Est AFR Am: 48 mL/min — ABNORMAL LOW (ref >60)
GFR, Estimated: 41 mL/min — ABNORMAL LOW (ref >60)
Glucose, Bld: 103 mg/dL — ABNORMAL HIGH (ref 70–99)
Potassium: 4.7 mmol/L (ref 3.5–5.1)
Sodium: 141 mmol/L (ref 135–145)
Total Bilirubin: 0.4 mg/dL (ref 0.3–1.2)
Total Protein: 7.3 g/dL (ref 6.5–8.1)

## 2019-12-25 NOTE — Progress Notes (Signed)
[place] Telephone:(336) [phone]   Fax:(336) [phone]  CONSULT NOTE  REFERRING PHYSICIAN: Dr. Roxan Hockey  REASON FOR CONSULTATION:  Stage Ia non-small cell lung cancer, adenocarcinoma  HPI Anne Hogan is a 74 y.o. female with a past medical history of mitral valve prolapse, severe mitral regurgitation, patent foramen ovale, arthritis, asthma, sleep apnea, reflux, and  depression was referred to the clinic for evaluation of stage Ia non-small cell lung cancer, adenocarcinoma.  In November 2020 the patient had a preop CT angiogram for a mitral valve repair.  The scan incidentally noted a 3.9 cm subsolid left lower lobe pulmonary opacity, the radiologist mentioned that this may represent focal infectious/inflammatory process versus adenocarcinoma.  In January 2021 the patient had a PET scan to further evaluate this lesion.  The scan demonstrated increased FDG uptake associated with the left lower lobe nodule has an SUV max of 3.47.  Findings are suspicious for primary bronchogenic carcinoma, possibly adenocarcinoma.  There was no evidence of thoracic adenopathy or distant metastatic disease.  The patient had a CT scan in March 2021 which did not show any substantial change in the left lower lobe lesion.  On Nov 23, 2019 the patient underwent a left lower lobe lobectomy with Dr. Roxan Hockey.  The left lower lobe lesion was consistent with this 2.6 cm adenocarcinoma.  Lymph nodes were negative for carcinoma.  This is consistent with stage Ia.  Today, the patient is feeling fairly well without any concerning complaints. She has felt particularly well over the last 3 days. Denies any fever, chills, night sweats, or weight loss. Denies any shortness of breath or hemoptysis. She reports mild chest pain with deep breathing since her surgery and a dry cough. She states that the pain is not severe enough to warrant any pain medication such as tylenol. Denies any nausea, vomiting,  diarrhea, or constipation. Denies any headache or visual changes.   The patient's family history consist of a mother with a past medical history of mitral valve prolapse and what sounds to be like essential tremor.  The patient's father had a past medical history of congestive heart failure and possibly diabetes.  The patient's family history is negative for carcinoma except for 2 paternal aunts with breast cancer.  The patient is widowed without any children.  The patient used to work as an Research scientist (life sciences).  The patient has a history of smoking with about 1.52 packs of cigarettes per day for 24 years.  The patient quit smoking at age 46.  The patient has a history of remote alcohol use.  The patient reports rare alcohol use. She states she has about 1 to 2 glasses of wine per year.  There is no history of drug use.      HPI  Past Medical History:  Diagnosis Date  . ADD (attention deficit disorder)   . Arthritis    "right knee" (04/19/2013)  . Asthma 04/03/2019  . CHF (congestive heart failure) ([place])   . Chronic diastolic congestive heart failure ([place])   . Claustrophobia   . Depression   . Dyspnea   . Dysrhythmia   . Fracture of right tibial plateau 05/2016  . GERD (gastroesophageal reflux disease)   . Heart murmur   . Incidental pulmonary nodule, greater than or equal to 66mm 06/06/2019   Needs f/u CT in 3 months  . Migraine    "bad when I was in 5th or 6th grade; I can usually ward them off by  resting my head now" (04/19/2013)  . MVP (mitral valve prolapse)   . S/P minimally-invasive mitral valve repair 08/03/2019   Complex valvuloplasty including artificial Gore-tex neochords x12 and Sorin Memo 4D ring annuloplasty, size 38 mm  . S/P patent foramen ovale closure 08/03/2019  . Sleep apnea    has not used mouth guard in 5 years no cpap used   . Squamous carcinoma ?1999   "upper left groin" (04/19/2013)    Past Surgical History:  Procedure Laterality Date  . CARDIAC  CATHETERIZATION  05/26/2019  . CHOLECYSTECTOMY  04/18/2013  . CHOLECYSTECTOMY N/A 04/18/2013   Procedure: LAPAROSCOPIC CHOLECYSTECTOMY WITH INTRAOPERATIVE CHOLANGIOGRAM;  Surgeon: Ralene Ok, MD;  Location: [place];  Service: General;  Laterality: N/A;  . COLONOSCOPY W/ POLYPECTOMY    . EYE SURGERY Bilateral FALL  2016   IOC FOR CATARACTS  . FRACTURE SURGERY    . INTERCOSTAL NERVE BLOCK Left 11/23/2019   Procedure: Intercostal Nerve Block;  Surgeon: Melrose Nakayama, MD;  Location: [place];  Service: Thoracic;  Laterality: Left;  . JOINT REPLACEMENT    . KNEE ARTHROSCOPY Right 12/2007   "meniscus repair" (04/19/2013)  . LYMPH NODE DISSECTION Left 11/23/2019   Procedure: Lymph Node Dissection;  Surgeon: Melrose Nakayama, MD;  Location: [place];  Service: Thoracic;  Laterality: Left;  . MITRAL VALVE REPAIR Right 08/03/2019   Procedure: MINIMALLY INVASIVE MITRAL VALVE REPAIR (MVR) using Memo 4D 38 MM Mitral Valve.;  Surgeon: Rexene Alberts, MD;  Location: [place];  Service: Open Heart Surgery;  Laterality: Right;  . REPAIR OF PATENT FORAMEN OVALE N/A 08/03/2019   Procedure: CLOSURE OF PATENT FORAMEN OVALE;  Surgeon: Rexene Alberts, MD;  Location: [place];  Service: Open Heart Surgery;  Laterality: N/A;  . RETINAL TEAR REPAIR CRYOTHERAPY Left ~ 1997  . RIGHT/LEFT HEART CATH AND CORONARY ANGIOGRAPHY N/A 05/26/2019   Procedure: RIGHT/LEFT HEART CATH AND CORONARY ANGIOGRAPHY;  Surgeon: Jettie Booze, MD;  Location: [place] [facility];  Service: Cardiovascular;  Laterality: N/A;  . TEE WITHOUT CARDIOVERSION N/A 05/26/2019   Procedure: TRANSESOPHAGEAL ECHOCARDIOGRAM (TEE);  Surgeon: Josue Hector, MD;  Location: [facility];  Service: Cardiovascular;  Laterality: N/A;  . TEE WITHOUT CARDIOVERSION N/A 08/03/2019   Procedure: TRANSESOPHAGEAL ECHOCARDIOGRAM (TEE);  Surgeon: Rexene Alberts, MD;  Location: [place];  Service: Open Heart Surgery;  Laterality: N/A;  . TONSILLECTOMY  ~ 1952  . TOTAL  KNEE ARTHROPLASTY Right 09/21/2016   Procedure: RIGHT TOTAL KNEE ARTHROPLASTY;  Surgeon: Gaynelle Arabian, MD;  Location: [facility];  Service: Orthopedics;  Laterality: Right;  requests 31mins with abductor block  . VARICOSE VEIN SURGERY Right 2000's  . VASCULAR SURGERY    . WRIST SURGERY Right 1975   "had a knot taken off" (04/19/2013)    Family History  Problem Relation Age of Onset  . Tremor Mother   . Osteoporosis Mother   . Sarcoidosis Mother   . Hypertension Mother   . Congestive Heart Failure Father   . Heart attack Father   . Diabetes Mellitus II Father   . Diabetes Mellitus II Brother     Social History Social History   Tobacco Use  . Smoking status: Former Smoker    Packs/day: 1.50    Years: 20.00    Pack years: 30.00    Types: Cigarettes    Quit date: 11/30/1986    Years since quitting: 33.0  . Smokeless tobacco: Never Used  Substance Use Topics  . Alcohol  use: Yes    Comment: RARE  . Drug use: No    Allergies  Allergen Reactions  . Penicillins Rash    Has patient had a PCN reaction causing immediate rash, facial/tongue/throat swelling, SOB or lightheadedness with hypotension: No Has patient had a PCN reaction causing severe rash involving mucus membranes or skin necrosis: no Has patient had a PCN reaction that required hospitalization: No Has patient had a PCN reaction occurring within the last 10 years: no If all of the above answers are "NO", then may proceed with Cephalosporin use.    . Codeine Other (See Comments)    Possible sensitivity, patient not sure  . Milk-Related Compounds Nausea And Vomiting and Other (See Comments)    Can have ice cream, pt just doesn't want to drink milk  . Morphine And Related     "strange sensation across midsection and chest"    Current Outpatient Medications  Medication Sig Dispense Refill  . acetaminophen (TYLENOL) 500 MG tablet Take 500-1,000 mg by mouth every 6 (six) hours as needed for moderate pain (pain).     [name]  albuterol (PROVENTIL) (2.5 MG/3ML) 0.083% nebulizer solution Take 2.5 mg by nebulization every 4 (four) hours as needed for wheezing or shortness of breath.     . Alum Hydroxide-Mag Carbonate (GAVISCON EXTRA STRENGTH) 160-105 MG CHEW Chew 1 tablet by mouth 2 (two) times daily as needed (indigestion).     [name] aspirin EC 81 MG EC tablet Take 1 tablet (81 mg total) by mouth daily.    . Biotin 5000 MCG TABS Take 5,000 mcg by mouth daily.    [name] BREO ELLIPTA 100-25 MCG/INH AEPB USE 1 PUFF DAILY AS DIRECTED 60 each 0  . buPROPion (WELLBUTRIN XL) 300 MG 24 hr tablet Take 300 mg by mouth daily.    . Ca Phosphate-Cholecalciferol (CALCIUM/VITAMIN D3 GUMMIES PO) Take 2 tablets by mouth daily.     [name] docusate sodium (COLACE) 100 MG capsule Take 100 mg by mouth daily as needed for mild constipation.    . montelukast (SINGULAIR) 10 MG tablet Take 10 mg by mouth daily as needed (respiratory issues.).    [name] Multiple Vitamins-Minerals (MULTIVITAMIN WITH MINERALS) tablet Take 1 tablet by mouth daily. Smart[employer]ants Women's Masters 31+    . Vitamin D, Ergocalciferol, (DRISDOL) 1.25 MG (50000 UT) CAPS capsule Take 50,000 Units by mouth 2 (two) times a week. Sundays & Thursdays.     No current facility-administered medications for this visit.    REVIEW OF SYSTEMS:   Review of Systems  Constitutional: Negative for appetite change, chills, fatigue, fever and unexpected weight change.  HENT: Negative for mouth sores, nosebleeds, sore throat and trouble swallowing.   Eyes: Negative for eye problems and icterus.  Respiratory: Positive for stable dry cough. Negative for hemoptysis, shortness of breath and wheezing.   Cardiovascular: Positive for mild pleuritic chest pain with inspiration secondary to recent surgery. Negative for leg swelling.  Gastrointestinal: Negative for abdominal pain, constipation, diarrhea, nausea and vomiting.  Genitourinary: Negative for bladder incontinence, difficulty urinating, dysuria, frequency and  hematuria.   Musculoskeletal: Negative for back pain, gait problem, neck pain and neck stiffness.  Skin: Negative for itching and rash.  Neurological: Negative for dizziness, extremity weakness, gait problem, headaches, light-headedness and seizures.  Hematological: Negative for adenopathy. Does not bruise/bleed easily.  Psychiatric/Behavioral: Negative for confusion, depression and sleep disturbance. The patient is not nervous/anxious.     PHYSICAL EXAMINATION:  Blood pressure 123/75, pulse 100, temperature (!) 97.5 F (  36.4 C), temperature source Temporal, resp. rate 20, weight 169 lb 9.6 oz (76.9 kg), SpO2 100 %.  ECOG PERFORMANCE STATUS: 1  Physical Exam  Constitutional: Oriented to person, place, and time and well-developed, well-nourished, and in no distress.  HENT:  Head: Normocephalic and atraumatic.  Mouth/Throat: Oropharynx is clear and moist. No oropharyngeal exudate.  Eyes: Conjunctivae are normal. Right eye exhibits no discharge. Left eye exhibits no discharge. No scleral icterus.  Neck: Normal range of motion. Neck supple.  Cardiovascular: Normal rate, regular rhythm, normal heart sounds and intact distal pulses.   Pulmonary/Chest: Effort normal and breath sounds normal. No respiratory distress. No wheezes. No rales.  Abdominal: Soft. Bowel sounds are normal. Exhibits no distension and no mass. There is no tenderness.  Musculoskeletal: Normal range of motion. Exhibits no edema.  Lymphadenopathy:    No cervical adenopathy.  Neurological: Alert and oriented to person, place, and time. Exhibits normal muscle tone. Gait normal. Coordination normal.  Skin: Skin is warm and dry. No rash noted. Not diaphoretic. No erythema. No pallor.  Psychiatric: Mood, memory and judgment normal.  Vitals reviewed.   LABORATORY DATA: Lab Results  Component Value Date   WBC 6.5 12/25/2019   HGB 13.1 12/25/2019   HCT 41.2 12/25/2019   MCV 89.4 12/25/2019   PLT 357 12/25/2019       Chemistry      Component Value Date/Time   NA 141 12/25/2019 1310   NA 140 05/12/2019 1243   K 4.7 12/25/2019 1310   CL 107 12/25/2019 1310   CO2 25 12/25/2019 1310   BUN 14 12/25/2019 1310   BUN 14 05/12/2019 1243   CREATININE 1.28 (H) 12/25/2019 1310   CREATININE 1.05 (H) 07/19/2019 1206      Component Value Date/Time   CALCIUM 9.4 12/25/2019 1310   ALKPHOS 88 12/25/2019 1310   AST 14 (L) 12/25/2019 1310   ALT 11 12/25/2019 1310   BILITOT 0.4 12/25/2019 1310       RADIOGRAPHIC STUDIES: DG Chest 2 View  Result Date: 12/12/2019 CLINICAL DATA:  Status post lobectomy EXAM: CHEST - 2 VIEW COMPARISON:  11/25/2019 FINDINGS: Postsurgical changes are again seen. Cardiac shadow is stable. Aortic calcifications are noted. Mild volume loss on the left is noted related to the prior surgery. No focal infiltrate or sizable effusion is seen. No bony abnormality is noted. IMPRESSION: No active cardiopulmonary disease. Electronically Signed   By: Inez Catalina M.D.   On: 12/12/2019 12:26    ASSESSMENT: This is a very pleasant 74 year old Caucasian female diagnosed with stage Ia non-small cell lung cancer, adenocarcinoma.  She presented with a left lower lobe nodule.  She was diagnosed in May 2021.   PLAN:  The patient was seen with Dr. Julien Nordmann today.  Dr. Julien Nordmann had a lengthy discussion with the patient about her current condition and recommendations for management of her condition.  Dr. Julien Nordmann recommends the patient continue with observation with a restaging CT scan of the chest in 6 months.  I will arrange for the patient to have a restaging CT scan at that time.  We will see the patient back for a follow-up visit in 6 months for evaluation and to review her scan results.   The patient voices understanding of current disease status and treatment options and is in agreement with the current care plan.   All questions were answered. The patient knows to call the clinic with any problems,  questions or concerns. We can certainly see the  patient much sooner if necessary.  Thank you so much for allowing me to participate in the care of Anne Hogan. I will continue to follow up the patient with you and assist in her care. The total time spent in the appointment was 60 minutes.  Disclaimer: This note was dictated with voice recognition software. Similar sounding words can inadvertently be transcribed and may not be corrected upon review.   Bessie Livingood L Mael Delap December 25, 2019, 3:09 PM   ADDENDUM: Hematology/Oncology Attending: I had a face-to-face encounter with the patient today.  I recommended her care plan.  This is a very pleasant 74 years old white female who was found on preoperative CT angiogram for mitral valve repair to have subsolid 3.9 cm left lower lobe pulmonary opacity.  This was followed by a PET scan and then surgical resection with left lower lobectomy and lymph node dissection under the care of Dr. Roxan Hockey.  The final pathology showed 2.6 cm non-small cell lung cancer, adenocarcinoma with negative resection margin as well as negative pleural and lymphovascular invasion. The patient is recovering well from her surgery and she is here today for evaluation and recommendation regarding her condition. When seen today the patient is feeling fine and recovering well.  She continues to have mild soreness on the left side of the chest.  She denied having any shortness of breath, cough or hemoptysis. I had a lengthy discussion with the patient today about her current condition and treatment options. I explained to the patient that the 5-year survival for patient with a stage Ia non-small cell lung cancer is around 80% and the current standard of care is observation.  There was no benefit for adjuvant systemic chemotherapy or radiotherapy for patient with a stage Ia after resection. I recommended for the patient to come back for follow-up visit in 6 months for evaluation  and repeat CT scan of the chest. She was advised to call immediately if she has any concerning symptoms in the interval.  Disclaimer: This note was dictated with voice recognition software. Similar sounding words can inadvertently be transcribed and may be missed upon review. Eilleen Kempf, MD 12/25/19

## 2019-12-27 ENCOUNTER — Encounter (HOSPITAL_COMMUNITY): Payer: PPO

## 2019-12-27 DIAGNOSIS — B070 Plantar wart: Secondary | ICD-10-CM | POA: Diagnosis not present

## 2019-12-27 DIAGNOSIS — M79671 Pain in right foot: Secondary | ICD-10-CM | POA: Diagnosis not present

## 2019-12-28 ENCOUNTER — Telehealth: Payer: Self-pay | Admitting: Physician Assistant

## 2019-12-28 NOTE — Telephone Encounter (Signed)
Scheduled per los. Called and left msg. Mailed printout  °

## 2019-12-29 ENCOUNTER — Encounter: Payer: Self-pay | Admitting: Internal Medicine

## 2019-12-29 ENCOUNTER — Telehealth: Payer: Self-pay | Admitting: Physician Assistant

## 2019-12-29 ENCOUNTER — Encounter (HOSPITAL_COMMUNITY): Payer: PPO

## 2019-12-29 NOTE — Telephone Encounter (Signed)
Called the patient back regarding her my chart message. Spoke to her on the phone. She describes a drainage sensation, especially when she lays down. Has a dry cough. Advised the patient to try OTC anti-histamines such as Claritin, she also has a history of reflux. Advised her to trial out a PPI. Also for symptomatic treatment, may try OTC cough medication. Assured her I do not feel this is related to malignancy. If her symptoms worsen, advised her to follow up with her PCP. She was appreciative of the call.

## 2020-01-01 ENCOUNTER — Encounter (HOSPITAL_COMMUNITY): Payer: PPO

## 2020-01-03 ENCOUNTER — Encounter (HOSPITAL_COMMUNITY): Payer: PPO

## 2020-01-05 ENCOUNTER — Other Ambulatory Visit: Payer: Self-pay | Admitting: Pulmonary Disease

## 2020-01-05 ENCOUNTER — Encounter (HOSPITAL_COMMUNITY): Payer: PPO

## 2020-01-05 DIAGNOSIS — R0600 Dyspnea, unspecified: Secondary | ICD-10-CM

## 2020-01-17 DIAGNOSIS — M79671 Pain in right foot: Secondary | ICD-10-CM | POA: Diagnosis not present

## 2020-01-17 DIAGNOSIS — D2372 Other benign neoplasm of skin of left lower limb, including hip: Secondary | ICD-10-CM | POA: Diagnosis not present

## 2020-01-17 DIAGNOSIS — M21621 Bunionette of right foot: Secondary | ICD-10-CM | POA: Diagnosis not present

## 2020-01-17 DIAGNOSIS — M21622 Bunionette of left foot: Secondary | ICD-10-CM | POA: Diagnosis not present

## 2020-01-17 DIAGNOSIS — M21612 Bunion of left foot: Secondary | ICD-10-CM | POA: Diagnosis not present

## 2020-01-17 DIAGNOSIS — B351 Tinea unguium: Secondary | ICD-10-CM | POA: Diagnosis not present

## 2020-01-18 DIAGNOSIS — R002 Palpitations: Secondary | ICD-10-CM

## 2020-01-19 ENCOUNTER — Ambulatory Visit
Admission: RE | Admit: 2020-01-19 | Discharge: 2020-01-19 | Disposition: A | Discharge status: Home / Self Care | Payer: PPO | Source: Ambulatory Visit | Attending: Obstetrics and Gynecology | Admitting: Obstetrics and Gynecology

## 2020-01-19 ENCOUNTER — Other Ambulatory Visit: Payer: Self-pay

## 2020-01-19 DIAGNOSIS — Z1231 Encounter for screening mammogram for malignant neoplasm of breast: Secondary | ICD-10-CM

## 2020-01-25 DIAGNOSIS — B351 Tinea unguium: Secondary | ICD-10-CM | POA: Diagnosis not present

## 2020-01-29 DIAGNOSIS — Z86010 Personal history of colonic polyps: Secondary | ICD-10-CM | POA: Diagnosis not present

## 2020-01-30 ENCOUNTER — Other Ambulatory Visit (INDEPENDENT_AMBULATORY_CARE_PROVIDER_SITE_OTHER): Payer: PPO

## 2020-01-30 DIAGNOSIS — R002 Palpitations: Secondary | ICD-10-CM

## 2020-02-01 DIAGNOSIS — Z124 Encounter for screening for malignant neoplasm of cervix: Secondary | ICD-10-CM | POA: Diagnosis not present

## 2020-02-05 ENCOUNTER — Other Ambulatory Visit: Payer: Self-pay | Admitting: Thoracic Surgery (Cardiothoracic Vascular Surgery)

## 2020-02-05 DIAGNOSIS — C3492 Malignant neoplasm of unspecified part of left bronchus or lung: Secondary | ICD-10-CM

## 2020-02-06 ENCOUNTER — Ambulatory Visit
Admission: RE | Admit: 2020-02-06 | Discharge: 2020-02-06 | Disposition: A | Discharge status: Home / Self Care | Payer: PPO | Source: Ambulatory Visit | Attending: Thoracic Surgery (Cardiothoracic Vascular Surgery) | Admitting: Thoracic Surgery (Cardiothoracic Vascular Surgery)

## 2020-02-06 ENCOUNTER — Ambulatory Visit (INDEPENDENT_AMBULATORY_CARE_PROVIDER_SITE_OTHER): Payer: Self-pay | Admitting: Thoracic Surgery (Cardiothoracic Vascular Surgery)

## 2020-02-06 ENCOUNTER — Encounter: Payer: Self-pay | Admitting: Thoracic Surgery (Cardiothoracic Vascular Surgery)

## 2020-02-06 ENCOUNTER — Other Ambulatory Visit: Payer: Self-pay

## 2020-02-06 VITALS — BP 122/73 | HR 90 | Temp 96.6°F | Resp 16 | Ht 62.5 in | Wt 168.0 lb

## 2020-02-06 DIAGNOSIS — C3492 Malignant neoplasm of unspecified part of left bronchus or lung: Secondary | ICD-10-CM

## 2020-02-06 DIAGNOSIS — Z85118 Personal history of other malignant neoplasm of bronchus and lung: Secondary | ICD-10-CM | POA: Diagnosis not present

## 2020-02-06 DIAGNOSIS — Z09 Encounter for follow-up examination after completed treatment for conditions other than malignant neoplasm: Secondary | ICD-10-CM

## 2020-02-06 NOTE — Progress Notes (Signed)
[address]       [place] [address]             [phone]       HPI: Anne Hogan returns for scheduled follow-up visit  Anne Hogan is a 74 year old woman with a history of mitral prolapse, mitral regurgitation, congestive heart failure, patent foramen ovale, arthritis, asthma, sleep apnea, reflux, claustrophobia, depression, palpitations, and stage Ia adenocarcinoma of the lung.  She is a lifelong non-smoker.  She presented with worsening shortness of breath and was found to have mitral valve regurgitation.  During her preoperative evaluation a CT showed a left lower lobe lung nodule.  She had a mitral valve repair by Dr. Roxy Manns which went smoothly.  I then did a robotic assisted left lower lobectomy on 11/23/2019.  Postoperatively she did well and went home on day 3.  I saw her back in May.  At that time she was doing well but had a persistent cough.  In the interim since her last visit she saw Dr. Julien Nordmann.  She is scheduled to have a CT of the chest in December.  Her cough has improved dramatically since her last visit.  She has a little bit of pain but does not take anything for that.  She has not had any problems with shortness of breath.  Past Medical History:  Diagnosis Date   ADD (attention deficit disorder)    Arthritis    "right knee" (04/19/2013)   Asthma 04/03/2019   CHF (congestive heart failure) (HCC)    Chronic diastolic congestive heart failure ([place])    Claustrophobia    Depression    Dyspnea    Dysrhythmia    Fracture of right tibial plateau 05/2016   GERD (gastroesophageal reflux disease)    Heart murmur    Incidental pulmonary nodule, greater than or equal to 87mm 06/06/2019   Needs f/u CT in 3 months   Migraine    "bad when I was in 5th or 6th grade; I can usually ward them off by resting my head now" (04/19/2013)   MVP (mitral valve prolapse)    S/P minimally-invasive mitral valve repair 08/03/2019   Complex valvuloplasty  including artificial Gore-tex neochords x12 and Sorin Memo 4D ring annuloplasty, size 38 mm   S/P patent foramen ovale closure 08/03/2019   Sleep apnea    has not used mouth guard in 5 years no cpap used    Squamous carcinoma ?1999   "upper left groin" (04/19/2013)    Current Outpatient Medications  Medication Sig Dispense Refill   acetaminophen (TYLENOL) 500 MG tablet Take 500-1,000 mg by mouth every 6 (six) hours as needed for moderate pain (pain).      albuterol (PROVENTIL) (2.5 MG/3ML) 0.083% nebulizer solution Take 2.5 mg by nebulization every 4 (four) hours as needed for wheezing or shortness of breath.      Alum Hydroxide-Mag Carbonate (GAVISCON EXTRA STRENGTH) 160-105 MG CHEW Chew 1 tablet by mouth 2 (two) times daily as needed (indigestion).      aspirin EC 81 MG EC tablet Take 1 tablet (81 mg total) by mouth daily.     Biotin 5000 MCG TABS Take 5,000 mcg by mouth daily.     BREO ELLIPTA 100-25 MCG/INH AEPB USE 1 PUFF DAILY AS DIRECTED 60 each 0   buPROPion (WELLBUTRIN XL) 300 MG 24 hr tablet Take 300 mg by mouth daily.     Ca Phosphate-Cholecalciferol (CALCIUM/VITAMIN D3 GUMMIES PO) Take 2  tablets by mouth daily.      docusate sodium (COLACE) 100 MG capsule Take 100 mg by mouth daily as needed for mild constipation.     montelukast (SINGULAIR) 10 MG tablet Take 10 mg by mouth daily as needed (respiratory issues.).     Multiple Vitamins-Minerals (CENTRUM SILVER 50+WOMEN PO) Take 1 tablet by mouth daily.     Vitamin D, Ergocalciferol, (DRISDOL) 1.25 MG (50000 UT) CAPS capsule Take 50,000 Units by mouth 2 (two) times a week. Sundays & Thursdays.     No current facility-administered medications for this visit.    Physical Exam BP 122/73 (BP Location: Left Arm, Patient Position: Sitting, Cuff Size: Normal)    Pulse 90    Temp (!) 96.6 F (35.9 C)    Resp 16    Ht 5' 2.5" (1.588 m)    Wt 168 lb (76.2 kg)    SpO2 98% Comment: RA   BMI 30.71 kg/m  74 year old woman in no  acute distress Alert and oriented x3 with no focal deficits Lungs diminished at left base but otherwise clear Cardiac regular rate and rhythm with no murmur Incisions well-healed  Diagnostic Tests: CHEST - 2 VIEW  COMPARISON:  12/12/2019  FINDINGS: Normal heart size. Prior mitral valve repair. Volume loss within the left hemithorax. No focal airspace consolidation, pleural effusion, or pneumothorax. Osseous structures are within normal limits.  IMPRESSION: No active cardiopulmonary disease.   Electronically Signed   By: Anne Hogan D.O.   On: 02/06/2020 11:05 I personally reviewed the chest x-ray images and concur with the findings noted above.  Expected postoperative changes.  Impression: Anne Hogan is a 74 year old non-smoker who was found to have a lung nodule during evaluation for a minimally invasive mitral valve repair.  That turned out to be a stage Ia adenocarcinoma.  She underwent a robotic assisted left lower lobectomy in early May.  Her only significant complaint was a cough.  That was quite bothersome for the first month and a half but has improved dramatically since then.  That is a very common occurrence after lobectomy and typically resolves over time.  She is doing extremely well from a pain standpoint.  She saw Dr. Julien Nordmann.  No adjuvant therapy is indicated.  She will follow up with him with a CT in December.  Plan: Follow-up with Dr. Julien Nordmann as scheduled I will see her back in January after she had her CT.  Melrose Nakayama, MD [facility] [phone]

## 2020-02-10 IMAGING — US US RENAL ARTERY STENOSIS
1 series · 13 of 25 positions shown · non-contrast
Comparison: None.

CLINICAL DATA: Chronic kidney disease stage 3.

EXAM:
RENAL/URINARY TRACT ULTRASOUND
RENAL DUPLEX DOPPLER ULTRASOUND

[Series 1: us renal artery stenosis · 0.23mm/px · 13 of 67 slices shown]
[im 1/67]
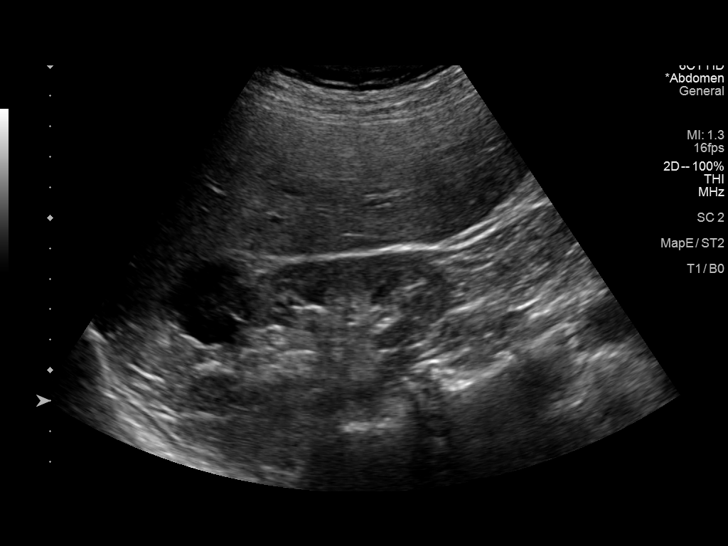
[im 6/67]
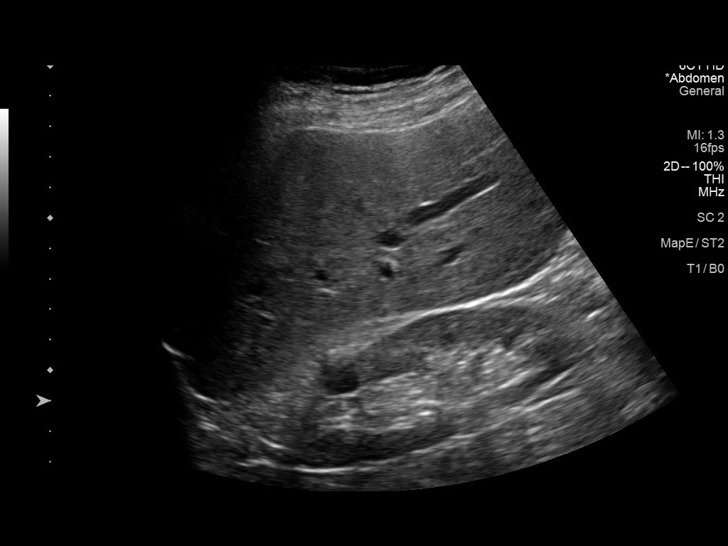
[im 12/67]
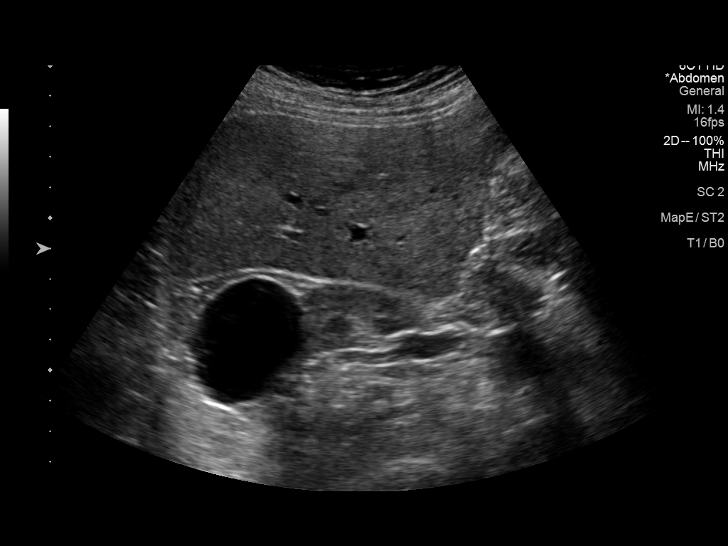
[im 17/67]
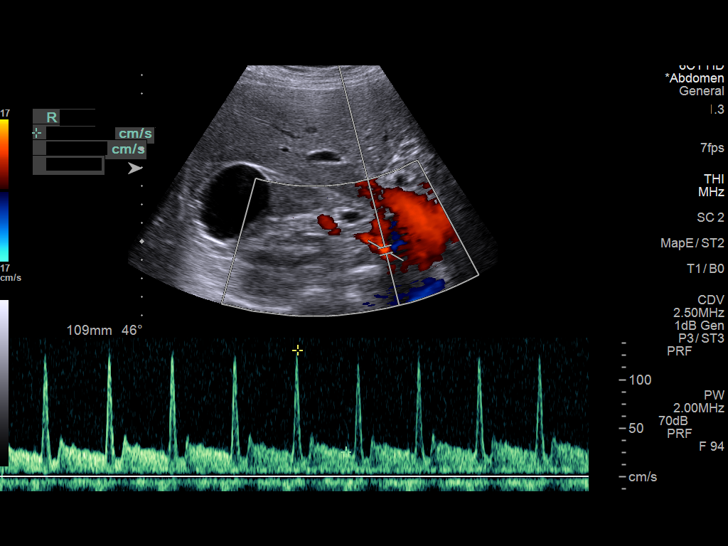
[im 23/67]
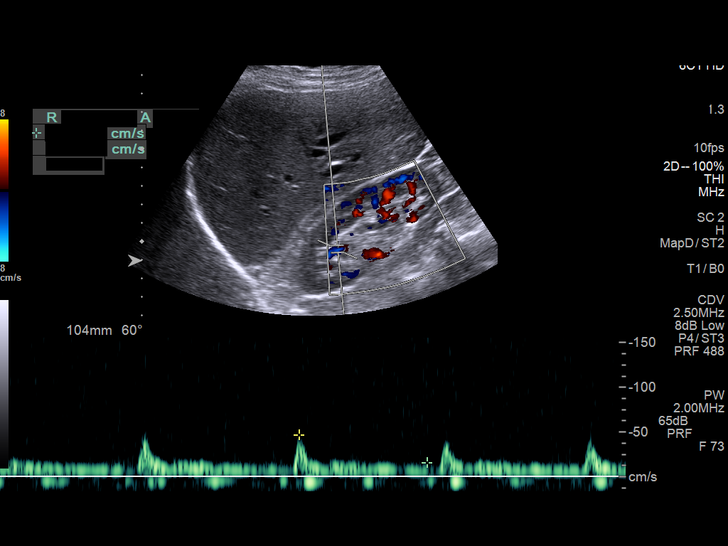
[im 28/67]
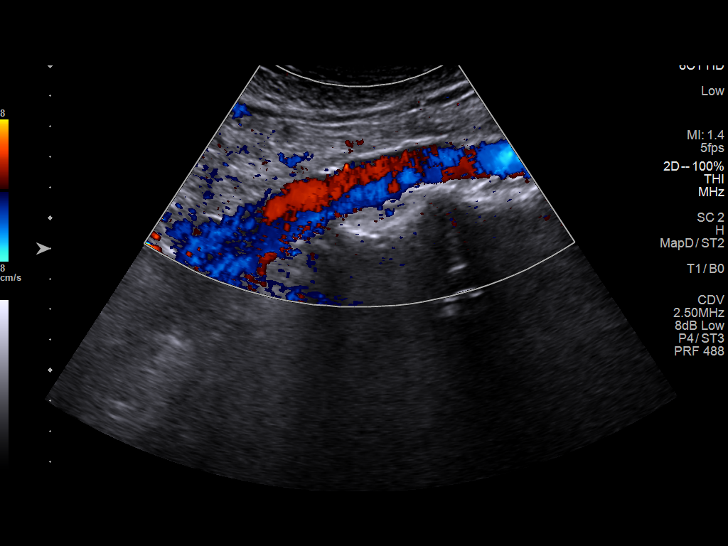
[im 34/67]
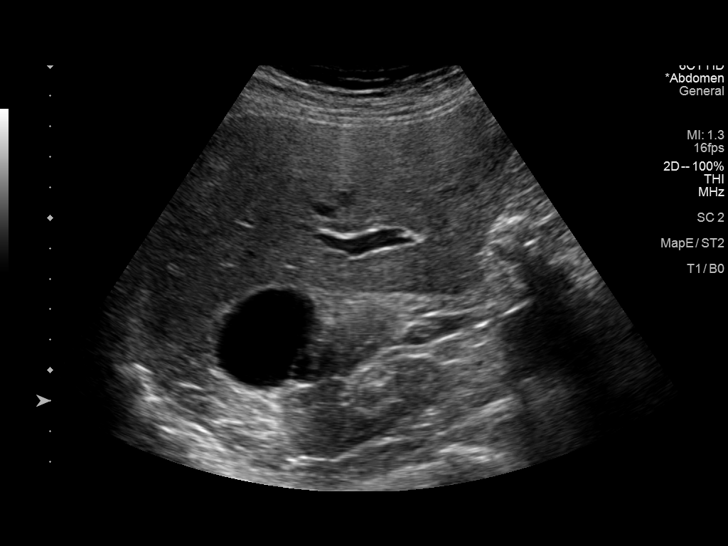
[im 39/67]
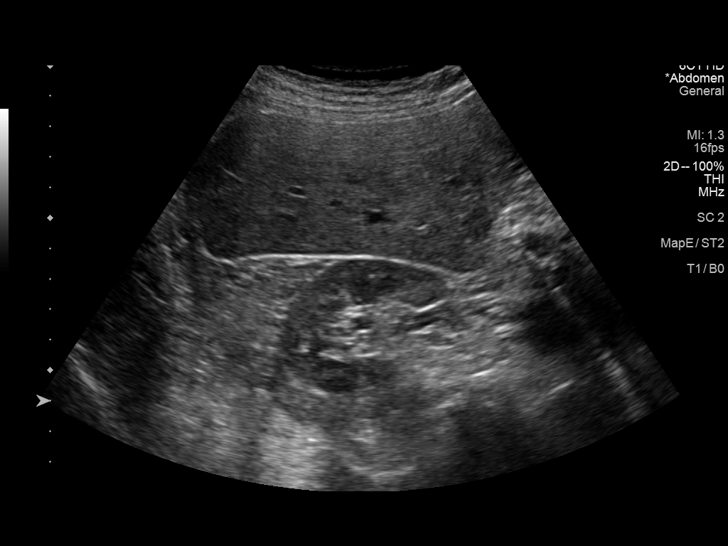
[im 45/67]
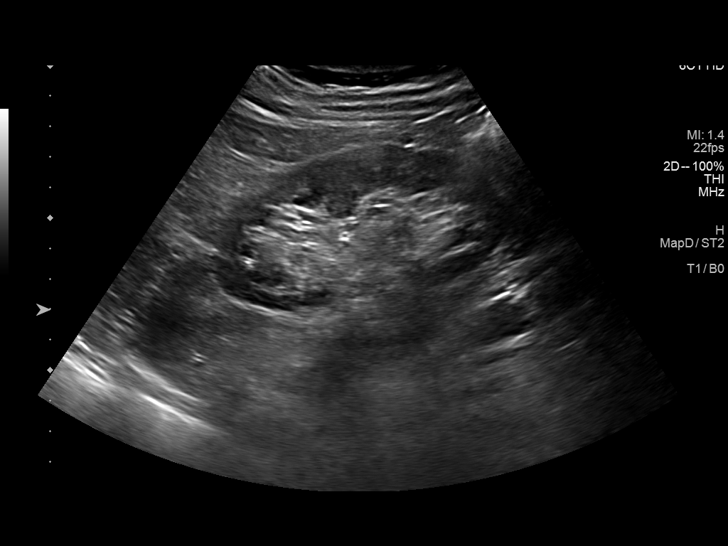
[im 50/67]
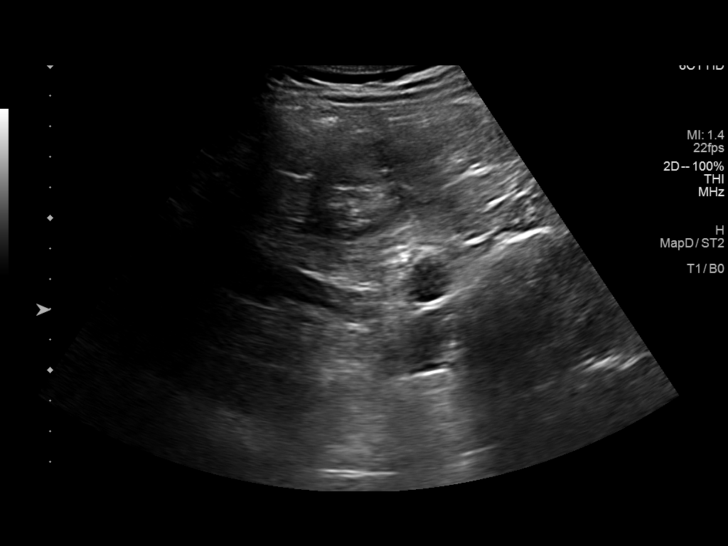
[im 56/67]
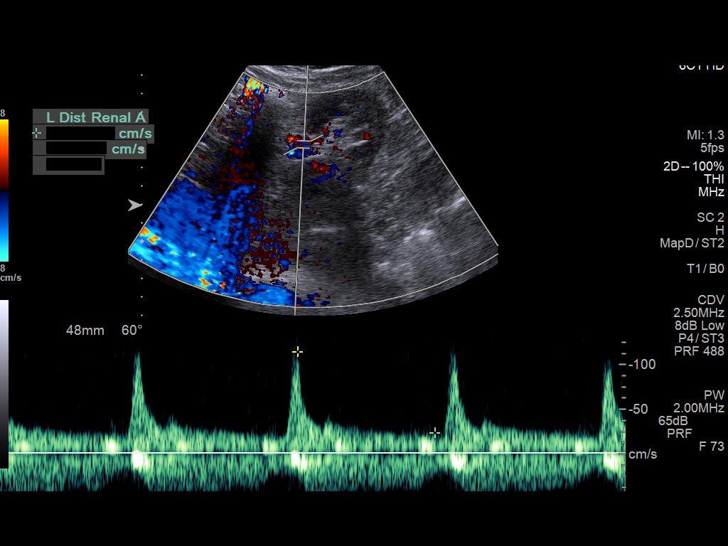
[im 61/67]
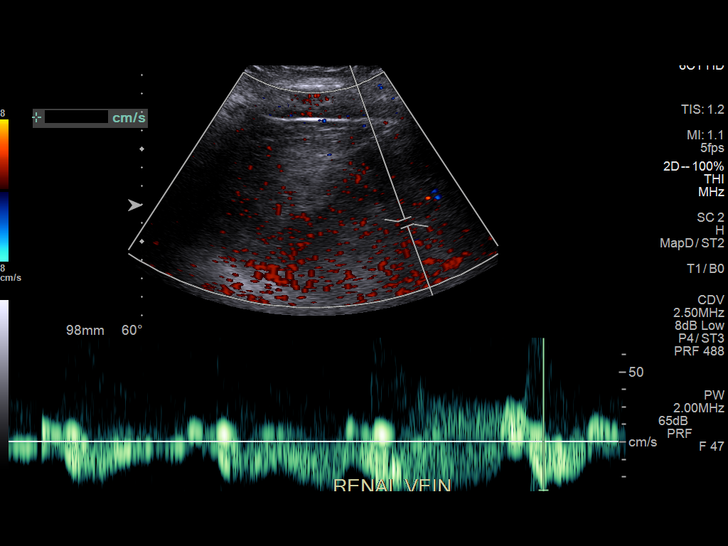
[im 67/67]
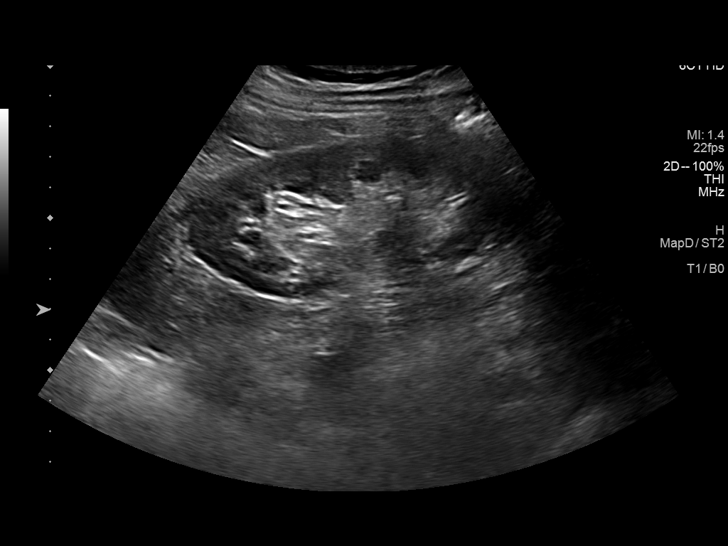

[13 of 25 positions shown; findings below may reference images not displayed]

FINDINGS: Right Kidney:

Length: 10.3 cm. Normal echogenicity. No renal obstruction or
hydronephrosis. Upper pole complex septated cyst measures 4.6 x
x 4.4 cm.

Left Kidney:

Length: 10.8 cm. Echogenicity within normal limits. No mass or
hydronephrosis visualized.

Bladder:  Decompressed, limited assessment

RENAL DUPLEX ULTRASOUND

Right Renal Artery Velocities:

Origin:  130 cm/sec

Mid:  124 cm/sec

Hilum:  113 cm/sec

Interlobar:  52 cm/sec

Arcuate:  30 cm/sec

Left Renal Artery Velocities:

Origin:  115 cm/sec

Mid:  112 cm/sec

Hilum:  119 cm/sec

Interlobar:  74 cm/sec

Arcuate:  44 cm/sec

Aortic Velocity:  106 cm/sec

Right Renal-Aortic Ratios:

Origin:

Mid:

Hilum:

Interlobar:

Arcuate:

Left Renal-Aortic Ratios:

Origin:

Mid:

Hilum:

Interlobar:

Arcuate:

No acute finding by renal ultrasound. No significant velocity or
ratio abnormality to suggest renal artery stenosis by ultrasound.
IMPRESSION: Negative for significant renal artery stenosis by ultrasound
criteria.

No acute finding by ultrasound

4.6 cm right renal upper pole minimally complex cyst.

## 2020-02-13 DIAGNOSIS — M8580 Other specified disorders of bone density and structure, unspecified site: Secondary | ICD-10-CM | POA: Diagnosis not present

## 2020-02-13 DIAGNOSIS — H9313 Tinnitus, bilateral: Secondary | ICD-10-CM | POA: Diagnosis not present

## 2020-02-13 DIAGNOSIS — R438 Other disturbances of smell and taste: Secondary | ICD-10-CM | POA: Diagnosis not present

## 2020-02-13 DIAGNOSIS — I340 Nonrheumatic mitral (valve) insufficiency: Secondary | ICD-10-CM | POA: Diagnosis not present

## 2020-02-13 DIAGNOSIS — N281 Cyst of kidney, acquired: Secondary | ICD-10-CM | POA: Diagnosis not present

## 2020-02-13 DIAGNOSIS — N1831 Chronic kidney disease, stage 3a: Secondary | ICD-10-CM | POA: Diagnosis not present

## 2020-02-16 ENCOUNTER — Other Ambulatory Visit: Payer: Self-pay | Admitting: Pulmonary Disease

## 2020-02-16 DIAGNOSIS — R002 Palpitations: Secondary | ICD-10-CM | POA: Diagnosis not present

## 2020-02-16 DIAGNOSIS — R0600 Dyspnea, unspecified: Secondary | ICD-10-CM

## 2020-02-19 ENCOUNTER — Encounter: Payer: Self-pay | Admitting: Thoracic Surgery (Cardiothoracic Vascular Surgery)

## 2020-02-19 DIAGNOSIS — D2372 Other benign neoplasm of skin of left lower limb, including hip: Secondary | ICD-10-CM | POA: Diagnosis not present

## 2020-02-19 DIAGNOSIS — M21612 Bunion of left foot: Secondary | ICD-10-CM | POA: Diagnosis not present

## 2020-02-19 DIAGNOSIS — M21611 Bunion of right foot: Secondary | ICD-10-CM | POA: Diagnosis not present

## 2020-02-19 DIAGNOSIS — M792 Neuralgia and neuritis, unspecified: Secondary | ICD-10-CM | POA: Diagnosis not present

## 2020-02-20 ENCOUNTER — Telehealth: Payer: Self-pay

## 2020-02-20 DIAGNOSIS — I5032 Chronic diastolic (congestive) heart failure: Secondary | ICD-10-CM

## 2020-02-20 NOTE — Telephone Encounter (Signed)
-----   Message from Will Meredith Leeds, MD sent at 02/16/2020  4:50 PM EDT ----- No prolonged arrhythmias noted on monitor and no symptoms reported. Continue current management

## 2020-02-20 NOTE — Telephone Encounter (Signed)
Spoke with the pt re: her heart monitor and she reports that she did have symptoms while wearing the monitor but she did not know to activate the button when she had them.... this morning she was still laying in bed and she had the worse palpitations she has ever had.. she describes it as a "quivering" in her chest that only lasted as few seconds.. no dizziness assoc with it.   Will forward to Dr. Curt Bears her next appt is 04/11/20.   * Pt is also asking if she can have a referral to a nutritionist.. she has been on a Renal healthy diet and drinking plenty of fluids but feels she would benefit from professional help.

## 2020-02-21 NOTE — Telephone Encounter (Signed)
Spoke with the patient and advised her that Dr. Curt Bears will refer her to a nutritionalist and we will await final results from monitor to assess palpitations further. Patient verbalized understanding and states that when the palpitations wake her up that she uses a pulse ox to take her HR and it is ususally 70s-80s.

## 2020-02-21 NOTE — Telephone Encounter (Signed)
Ok for nutrition referral. Anne Hogan evaluate palpitations when monitor returns, unable to evaluate without monitor results.

## 2020-03-05 ENCOUNTER — Other Ambulatory Visit: Payer: Self-pay

## 2020-03-05 ENCOUNTER — Ambulatory Visit: Payer: PPO | Admitting: Cardiology

## 2020-03-05 ENCOUNTER — Encounter: Payer: Self-pay | Admitting: Cardiology

## 2020-03-05 VITALS — BP 110/74 | HR 92 | Ht 62.5 in | Wt 162.0 lb

## 2020-03-05 DIAGNOSIS — I340 Nonrheumatic mitral (valve) insufficiency: Secondary | ICD-10-CM

## 2020-03-05 DIAGNOSIS — Z79899 Other long term (current) drug therapy: Secondary | ICD-10-CM

## 2020-03-05 NOTE — Patient Instructions (Signed)
Medication Instructions:  Your physician recommends that you continue on your current medications as directed. Please refer to the Current Medication list given to you today.  *If you need a refill on your cardiac medications before your next appointment, please call your pharmacy*   Lab Work: Today: Magnesium & Phosphorus If you have labs (blood work) drawn today and your tests are completely normal, you will receive your results only by: [name] MyChart Message (if you have MyChart) OR . A paper copy in the mail If you have any lab test that is abnormal or we need to change your treatment, we will call you to review the results.   Testing/Procedures: None ordered   Follow-Up: At [facility], you and your health needs are our priority.  As part of our continuing mission to provide you with exceptional heart care, we have created designated Provider Care Teams.  These Care Teams include your primary Cardiologist (physician) and Advanced Practice Providers (APPs -  Physician Assistants and Nurse Practitioners) who all work together to provide you with the care you need, when you need it.  We recommend signing up for the patient portal called "MyChart".  Sign up information is provided on this After Visit Summary.  MyChart is used to connect with patients for Virtual Visits (Telemedicine).  Patients are able to view lab/test results, encounter notes, upcoming appointments, etc.  Non-urgent messages can be sent to your provider as well.   To learn more about what you can do with MyChart, go to [email or web address].    Your next appointment:   6 month(s)  The format for your next appointment:   In Person  Provider:   Allegra Lai, MD   Thank you for choosing [facility]!!   Trinidad Curet, RN [phone]    Other Instructions

## 2020-03-05 NOTE — Progress Notes (Signed)
Cardiology Office Note   Date:  03/05/2020   ID:  Anne Hogan, DOB 02/08/1946, MRN [id number]  PCP:  [name], MD  Cardiologist:  Constance Haw, MD    No chief complaint on file.    History of Present Illness: Anne Hogan is a 74 y.o. female who presents today for cardiology evaluation.   She has a history of mitral regurgitation and palpitations, possibly due to PVCs.  She also has a history of severe mitral regurgitation is now status post mini mitral valve repair and PFO closure.  During that work-up she was found to have a lung mass and has been diagnosed with adenocarcinoma of the left lower lobe with status post resection.    Today, denies symptoms of palpitations, chest pain, shortness of breath, orthopnea, PND, lower extremity edema, claudication, dizziness, presyncope, syncope, bleeding, or neurologic sequela. The patient is tolerating medications without difficulties.  Currently she feels well.  She does continue to have intermittent palpitations, though her cardiac monitor shows no major abnormality.  She feels much better now that she has had her valve repaired.  She is also very relieved that she has had her adenocarcinoma removed.  Past Medical History:  Diagnosis Date  . ADD (attention deficit disorder)   . Arthritis    "right knee" (04/19/2013)  . Asthma 04/03/2019  . CHF (congestive heart failure) ([facility])   . Chronic diastolic congestive heart failure ([place])   . Claustrophobia   . Depression   . Dyspnea   . Dysrhythmia   . Fracture of right tibial plateau 05/2016  . GERD (gastroesophageal reflux disease)   . Heart murmur   . Incidental pulmonary nodule, greater than or equal to 35mm 06/06/2019   Needs f/u CT in 3 months  . Migraine    "bad when I was in 5th or 6th grade; I can usually ward them off by resting my head now" (04/19/2013)  . MVP (mitral valve prolapse)   . S/P minimally-invasive mitral valve repair 08/03/2019   Complex valvuloplasty  including artificial Gore-tex neochords x12 and Sorin Memo 4D ring annuloplasty, size 38 mm  . S/P patent foramen ovale closure 08/03/2019  . Sleep apnea    has not used mouth guard in 5 years no cpap used   . Squamous carcinoma ?1999   "upper left groin" (04/19/2013)   Past Surgical History:  Procedure Laterality Date  . CARDIAC CATHETERIZATION  05/26/2019  . CHOLECYSTECTOMY  04/18/2013  . CHOLECYSTECTOMY N/A 04/18/2013   Procedure: LAPAROSCOPIC CHOLECYSTECTOMY WITH INTRAOPERATIVE CHOLANGIOGRAM;  Surgeon: Ralene Ok, MD;  Location: [facility];  Service: General;  Laterality: N/A;  . COLONOSCOPY W/ POLYPECTOMY    . EYE SURGERY Bilateral FALL  2016   IOC FOR CATARACTS  . FRACTURE SURGERY    . INTERCOSTAL NERVE BLOCK Left 11/23/2019   Procedure: Intercostal Nerve Block;  Surgeon: Melrose Nakayama, MD;  Location: [facility];  Service: Thoracic;  Laterality: Left;  . JOINT REPLACEMENT    . KNEE ARTHROSCOPY Right 12/2007   "meniscus repair" (04/19/2013)  . LYMPH NODE DISSECTION Left 11/23/2019   Procedure: Lymph Node Dissection;  Surgeon: Melrose Nakayama, MD;  Location: [facility];  Service: Thoracic;  Laterality: Left;  . MITRAL VALVE REPAIR Right 08/03/2019   Procedure: MINIMALLY INVASIVE MITRAL VALVE REPAIR (MVR) using Memo 4D 38 MM Mitral Valve.;  Surgeon: Rexene Alberts, MD;  Location: [facility];  Service: Open Heart Surgery;  Laterality: Right;  . REPAIR OF PATENT FORAMEN  OVALE N/A 08/03/2019   Procedure: CLOSURE OF PATENT FORAMEN O[place]E;  Surgeon: Rexene Alberts, MD;  Location: [facility];  Service: Open Heart Surgery;  Laterality: N/A;  . RETINAL TEAR REPAIR CRYOTHERAPY Left ~ 1997  . RIGHT/LEFT HEART CATH AND CORONARY ANGIOGRAPHY N/A 05/26/2019   Procedure: RIGHT/LEFT HEART CATH AND CORONARY ANGIOGRAPHY;  Surgeon: Jettie Booze, MD;  Location: [facility];  Service: Cardiovascular;  Laterality: N/A;  . TEE WITHOUT CARDIOVERSION N/A 05/26/2019   Procedure: TRANSESOPHAGEAL ECHOCARDIOGRAM  (TEE);  Surgeon: Josue Hector, MD;  Location: [facility];  Service: Cardiovascular;  Laterality: N/A;  . TEE WITHOUT CARDIOVERSION N/A 08/03/2019   Procedure: TRANSESOPHAGEAL ECHOCARDIOGRAM (TEE);  Surgeon: Rexene Alberts, MD;  Location: [facility];  Service: Open Heart Surgery;  Laterality: N/A;  . TONSILLECTOMY  ~ 1952  . TOTAL KNEE ARTHROPLASTY Right 09/21/2016   Procedure: RIGHT TOTAL KNEE ARTHROPLASTY;  Surgeon: Gaynelle Arabian, MD;  Location: [facility];  Service: Orthopedics;  Laterality: Right;  requests 49mins with abductor block  . VARICOSE VEIN SURGERY Right 2000's  . VASCULAR SURGERY    . WRIST SURGERY Right 1975   "had a knot taken off" (04/19/2013)     Current Outpatient Medications  Medication Sig Dispense Refill  . acetaminophen (TYLENOL) 500 MG tablet Take 500-1,000 mg by mouth every 6 (six) hours as needed for moderate pain (pain).     [name] albuterol (PROVENTIL) (2.5 MG/3ML) 0.083% nebulizer solution Take 2.5 mg by nebulization every 4 (four) hours as needed for wheezing or shortness of breath.     . Alum Hydroxide-Mag Carbonate (GAVISCON EXTRA STRENGTH) 160-105 MG CHEW Chew 1 tablet by mouth 2 (two) times daily as needed (indigestion).     [name] aspirin EC 81 MG EC tablet Take 1 tablet (81 mg total) by mouth daily.    . Biotin 5000 MCG TABS Take 5,000 mcg by mouth daily.    [name] BREO ELLIPTA 100-25 MCG/INH AEPB USE 1 PUFF DAILY AS DIRECTED 60 each 0  . buPROPion (WELLBUTRIN XL) 300 MG 24 hr tablet Take 300 mg by mouth daily.    . Calcium-Magnesium-Zinc 333-133-5 MG TABS Take 1-3 tablets by mouth daily at 12 noon.    . docusate sodium (COLACE) 100 MG capsule Take 100 mg by mouth daily as needed for mild constipation.    . montelukast (SINGULAIR) 10 MG tablet Take 10 mg by mouth daily as needed (respiratory issues.).    [name] Multiple Vitamins-Minerals (CENTRUM SILVER 50+WOMEN PO) Take 1 tablet by mouth daily.    . Vitamin D, Ergocalciferol, (DRISDOL) 1.25 MG (50000 UT) CAPS capsule Take 50,000  Units by mouth 2 (two) times a week. Sundays & Thursdays.     No current facility-administered medications for this visit.    Allergies:   Penicillins, Codeine, Milk-related compounds, and Morphine and related   Social History:  The patient  reports that she quit smoking about 33 years ago. Her smoking use included cigarettes. She has a 30.00 pack-year smoking history. She has never used smokeless tobacco. She reports current alcohol use. She reports that she does not use drugs.   Family History:  The patient's family history includes Congestive Heart Failure in her father; Diabetes Mellitus II in her brother and father; Heart attack in her father; Hypertension in her mother; Osteoporosis in her mother; Sarcoidosis in her mother; Tremor in her mother.   ROS:  Please see the history of present illness.   Otherwise, review of systems is positive for  none.   All other systems are reviewed and negative.   PHYSICAL EXAM: VS:  BP 110/74   Pulse 92   Ht 5' 2.5" (1.588 m)   Wt 162 lb (73.5 kg)   SpO2 97%   BMI 29.16 kg/m  , BMI Body mass index is 29.16 kg/m. GEN: Well nourished, well developed, in no acute distress  HEENT: normal  Neck: no JVD, carotid bruits, or masses Cardiac: RRR; no murmurs, rubs, or gallops,no edema  Respiratory:  clear to auscultation bilaterally, normal work of breathing GI: soft, nontender, nondistended, + BS MS: no deformity or atrophy  Skin: warm and dry Neuro:  Strength and sensation are intact Psych: euthymic mood, full affect  EKG:  EKG is ordered today. Personal review of the ekg ordered shows sinus rhythm, rate 92  Recent Labs: 08/05/2019: TSH 2.265 08/06/2019: Magnesium 1.9 12/25/2019: ALT 11; BUN 14; Creatinine 1.28; Hemoglobin 13.1; Platelet Count 357; Potassium 4.7; Sodium 141    Lipid Panel  No results found for: CHOL, TRIG, HDL, CHOLHDL, VLDL, LDLCALC, LDLDIRECT   Wt Readings from Last 3 Encounters:  03/05/20 162 lb (73.5 kg)  02/06/20 168 lb  (76.2 kg)  12/25/19 169 lb 9.6 oz (76.9 kg)      Other studies Reviewed: Additional studies/ records that were reviewed today include: TTE 09/13/2019 1. Left ventricular ejection fraction, by estimation, is 60 to 65%. The  left ventricle has normal function. The left ventricle has no regional  wall motion abnormalities. Left ventricular diastolic parameters are  consistent with Grade I diastolic  dysfunction (impaired relaxation).  2. Right ventricular systolic function is normal. The right ventricular  size is normal. There is mildly elevated pulmonary artery systolic  pressure.  3. Left atrial size was mild to moderately dilated.  4. The mitral valve has been repaired/replaced. No evidence of mitral  valve regurgitation. No evidence of mitral stenosis. There is a 38 mm  prosthetic annuloplasty ring present in the mitral position. Procedure  Date: 08/03/2019. Echo findings are  consistent with normal structure and function of the mitral valve  prosthesis.  5. The aortic valve is tricuspid. Aortic valve regurgitation is mild. No  aortic stenosis is present.  6. The inferior vena cava is normal in size with greater than 50%  respiratory variability, suggesting right atrial pressure of 3 mmHg.   Cardiac monitor 02/16/2020 Max 169 bpm 11:01pm, 07/14 Min 67 bpm 12:15am, 07/16 Avg 82 bpm <1% PACs and PVCs One run of VT, 8 beats at 169 BMP One SVT run 4 beats at 125 BPM No reported symptoms   ASSESSMENT AND PLAN:  1.  Severe mitral regurgitation: Status post minimally invasive mitral valve repair and PFO closure.  Repeat echo shows no regurgitation or stenosis.  2. Palpitations: Continues to have minor palpitations.  Wore monitor that showed no evidence of major arrhythmia.  We Jamelle Noy continue to monitor.  3.  Non-small cell lung cancer: Found to be adenocarcinoma of the left lower lobe stage Ia T1N0.  Is now status post lobectomy.  Current medicines are reviewed at length  with the patient today.   The patient does not have concerns regarding her medicines.  The following changes were made today: None  Labs/ tests ordered today include:  Orders Placed This Encounter  Procedures  . Magnesium  . Phosphorus  . EKG 12-Lead     Disposition:   FU with Emarie Paul 12 months  Signed, Chauntae Hults Meredith Leeds, MD  03/05/2020 4:50 PM  [address] [place] [address] [phone] (office) [phone] (fax)

## 2020-03-06 LAB — PHOSPHORUS: Phosphorus: 3.3 mg/dL (ref 3.0–4.3)

## 2020-03-06 LAB — MAGNESIUM: Magnesium: 2.2 mg/dL (ref 1.6–2.3)

## 2020-03-27 ENCOUNTER — Other Ambulatory Visit: Payer: Self-pay | Admitting: Pulmonary Disease

## 2020-03-27 DIAGNOSIS — R0600 Dyspnea, unspecified: Secondary | ICD-10-CM

## 2020-04-09 ENCOUNTER — Ambulatory Visit: Payer: PPO | Admitting: Skilled Nursing Facility1

## 2020-04-11 ENCOUNTER — Ambulatory Visit: Payer: PPO | Admitting: Cardiology

## 2020-05-02 DIAGNOSIS — Z1159 Encounter for screening for other viral diseases: Secondary | ICD-10-CM | POA: Diagnosis not present

## 2020-05-07 ENCOUNTER — Telehealth: Payer: Self-pay | Admitting: Cardiology

## 2020-05-07 DIAGNOSIS — Z86010 Personal history of colonic polyps: Secondary | ICD-10-CM | POA: Diagnosis not present

## 2020-05-07 DIAGNOSIS — K5730 Diverticulosis of large intestine without perforation or abscess without bleeding: Secondary | ICD-10-CM | POA: Diagnosis not present

## 2020-05-07 MED ORDER — CLINDAMYCIN HCL 300 MG PO CAPS
ORAL_CAPSULE | ORAL | 0 refills | Status: DC
Start: 2020-05-07 — End: 2020-05-15

## 2020-05-07 NOTE — Telephone Encounter (Signed)
Pt informed Rx sent to [facility] per request. Medication instructions given to pt. Patient verbalized understanding and agreeable to plan.

## 2020-05-07 NOTE — Telephone Encounter (Signed)
    Pt got an appt with her dentist tomorrow for cleaning and would like to ask Dr. Curt Bears if she needs antibiotic before procedure. Her appt tomorrow is at 2pm She said the nurse can call her back or send her a Pharmacist, community message

## 2020-05-10 DIAGNOSIS — E785 Hyperlipidemia, unspecified: Secondary | ICD-10-CM | POA: Diagnosis not present

## 2020-05-10 DIAGNOSIS — R7301 Impaired fasting glucose: Secondary | ICD-10-CM | POA: Diagnosis not present

## 2020-05-10 DIAGNOSIS — M859 Disorder of bone density and structure, unspecified: Secondary | ICD-10-CM | POA: Diagnosis not present

## 2020-05-10 NOTE — Telephone Encounter (Signed)
Pt aware that I would be in touch at a later date with the abx she will be taking going forward.  Explained that we will use something different going forward. Pt is agreeable to plan

## 2020-05-15 MED ORDER — DOXYCYCLINE HYCLATE 100 MG PO CAPS: ORAL_CAPSULE | ORAL | 11 refills | Status: DC

## 2020-05-15 NOTE — Addendum Note (Signed)
Addended by: Stanton Kidney on: 05/15/2020 03:43 PM   Modules accepted: Orders

## 2020-05-16 DIAGNOSIS — H9193 Unspecified hearing loss, bilateral: Secondary | ICD-10-CM | POA: Diagnosis not present

## 2020-05-16 DIAGNOSIS — Z23 Encounter for immunization: Secondary | ICD-10-CM | POA: Diagnosis not present

## 2020-05-16 DIAGNOSIS — G4733 Obstructive sleep apnea (adult) (pediatric): Secondary | ICD-10-CM | POA: Diagnosis not present

## 2020-05-16 DIAGNOSIS — M8580 Other specified disorders of bone density and structure, unspecified site: Secondary | ICD-10-CM | POA: Diagnosis not present

## 2020-05-16 DIAGNOSIS — N281 Cyst of kidney, acquired: Secondary | ICD-10-CM | POA: Diagnosis not present

## 2020-05-16 DIAGNOSIS — R911 Solitary pulmonary nodule: Secondary | ICD-10-CM | POA: Diagnosis not present

## 2020-05-16 DIAGNOSIS — J45909 Unspecified asthma, uncomplicated: Secondary | ICD-10-CM | POA: Diagnosis not present

## 2020-05-16 DIAGNOSIS — E785 Hyperlipidemia, unspecified: Secondary | ICD-10-CM | POA: Diagnosis not present

## 2020-05-16 DIAGNOSIS — I340 Nonrheumatic mitral (valve) insufficiency: Secondary | ICD-10-CM | POA: Diagnosis not present

## 2020-05-16 DIAGNOSIS — R7301 Impaired fasting glucose: Secondary | ICD-10-CM | POA: Diagnosis not present

## 2020-05-16 DIAGNOSIS — N1831 Chronic kidney disease, stage 3a: Secondary | ICD-10-CM | POA: Diagnosis not present

## 2020-05-16 DIAGNOSIS — R82998 Other abnormal findings in urine: Secondary | ICD-10-CM | POA: Diagnosis not present

## 2020-05-16 DIAGNOSIS — Z0000 Encounter for general adult medical examination without abnormal findings: Secondary | ICD-10-CM | POA: Diagnosis not present

## 2020-05-21 IMAGING — CR DG CHEST 2V
2 series · 2 of 2 positions shown · non-contrast
Comparison: August 07, 2019.

CLINICAL DATA: Status post mitral valve repair.

EXAM:
CHEST - 2 VIEW

[w chest pa]
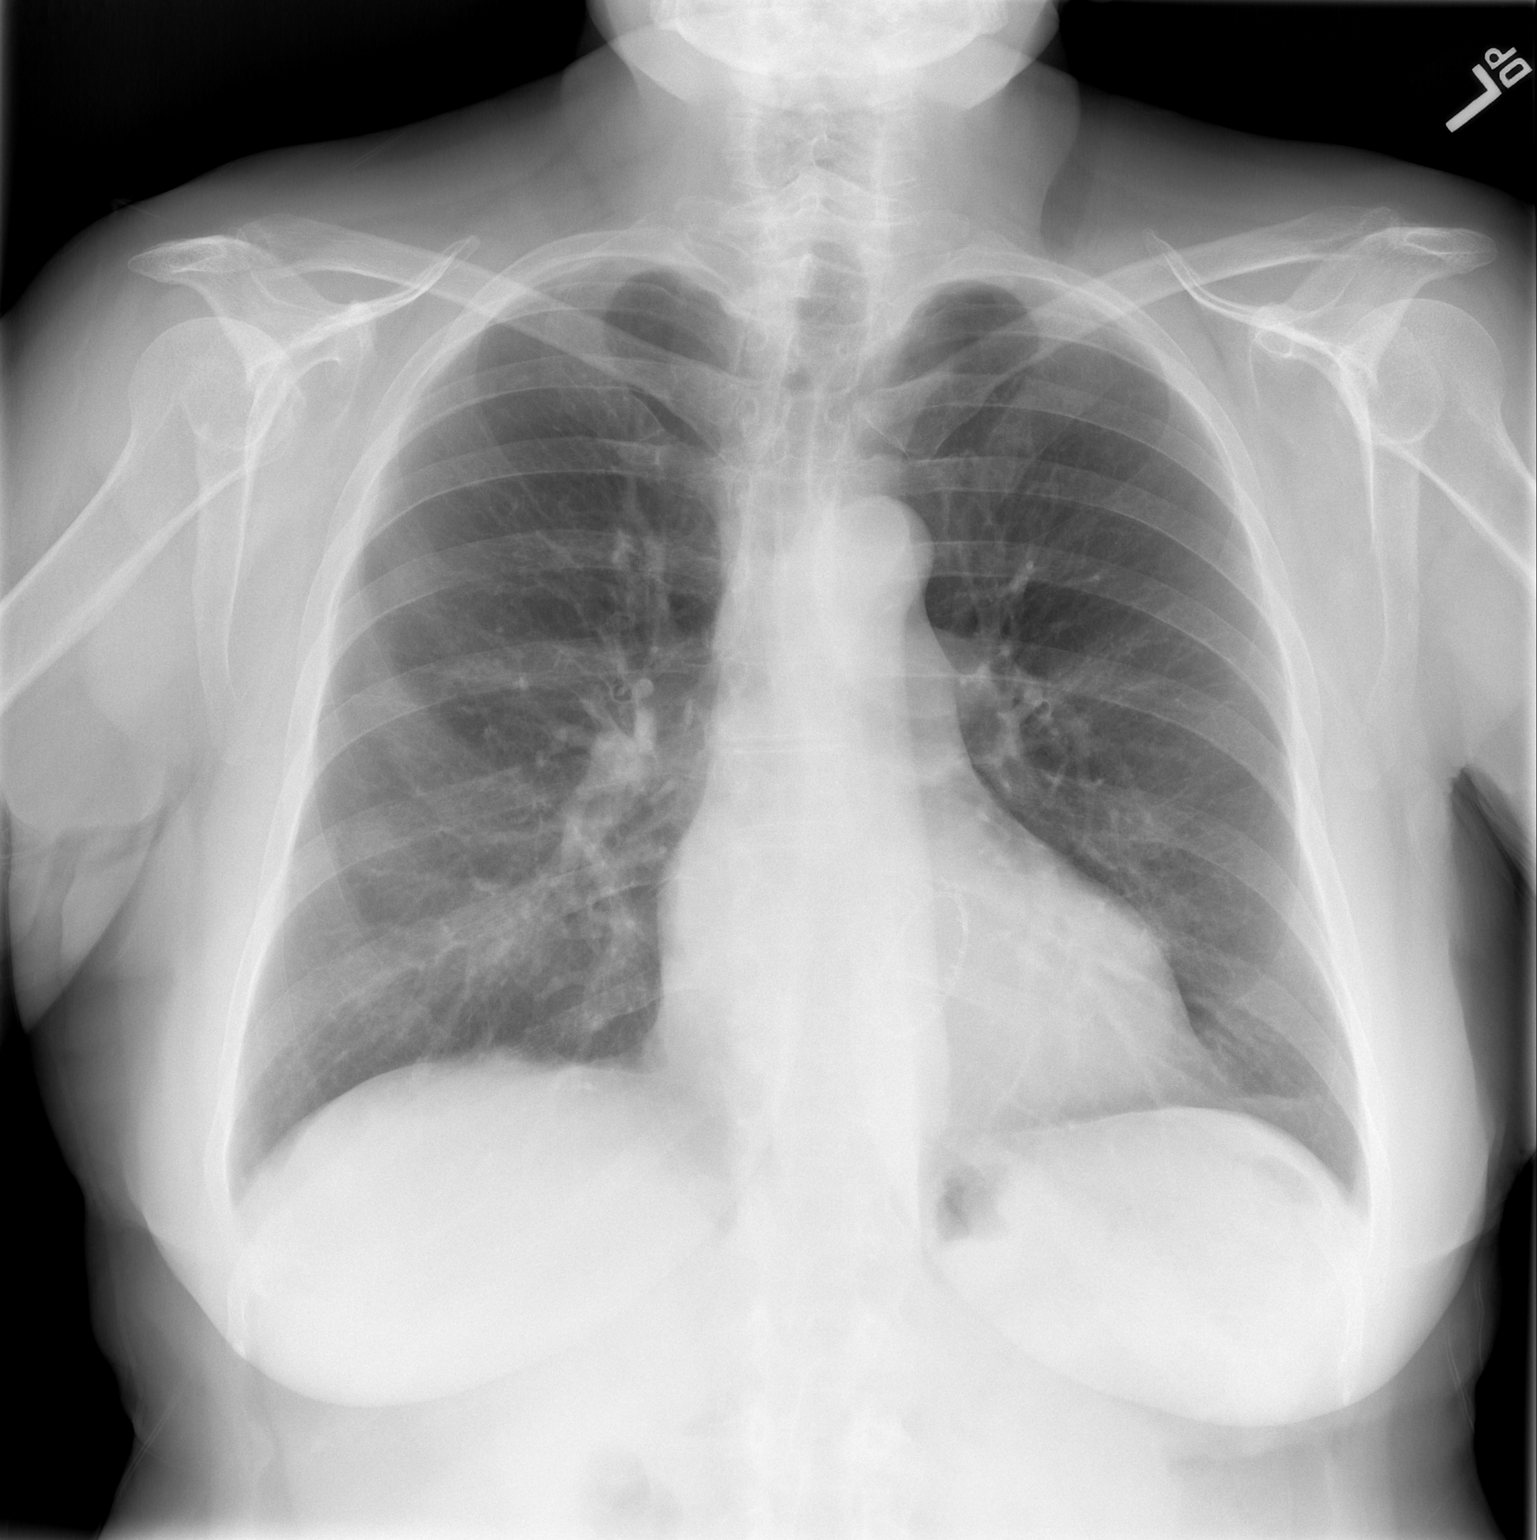

[w chest lat]
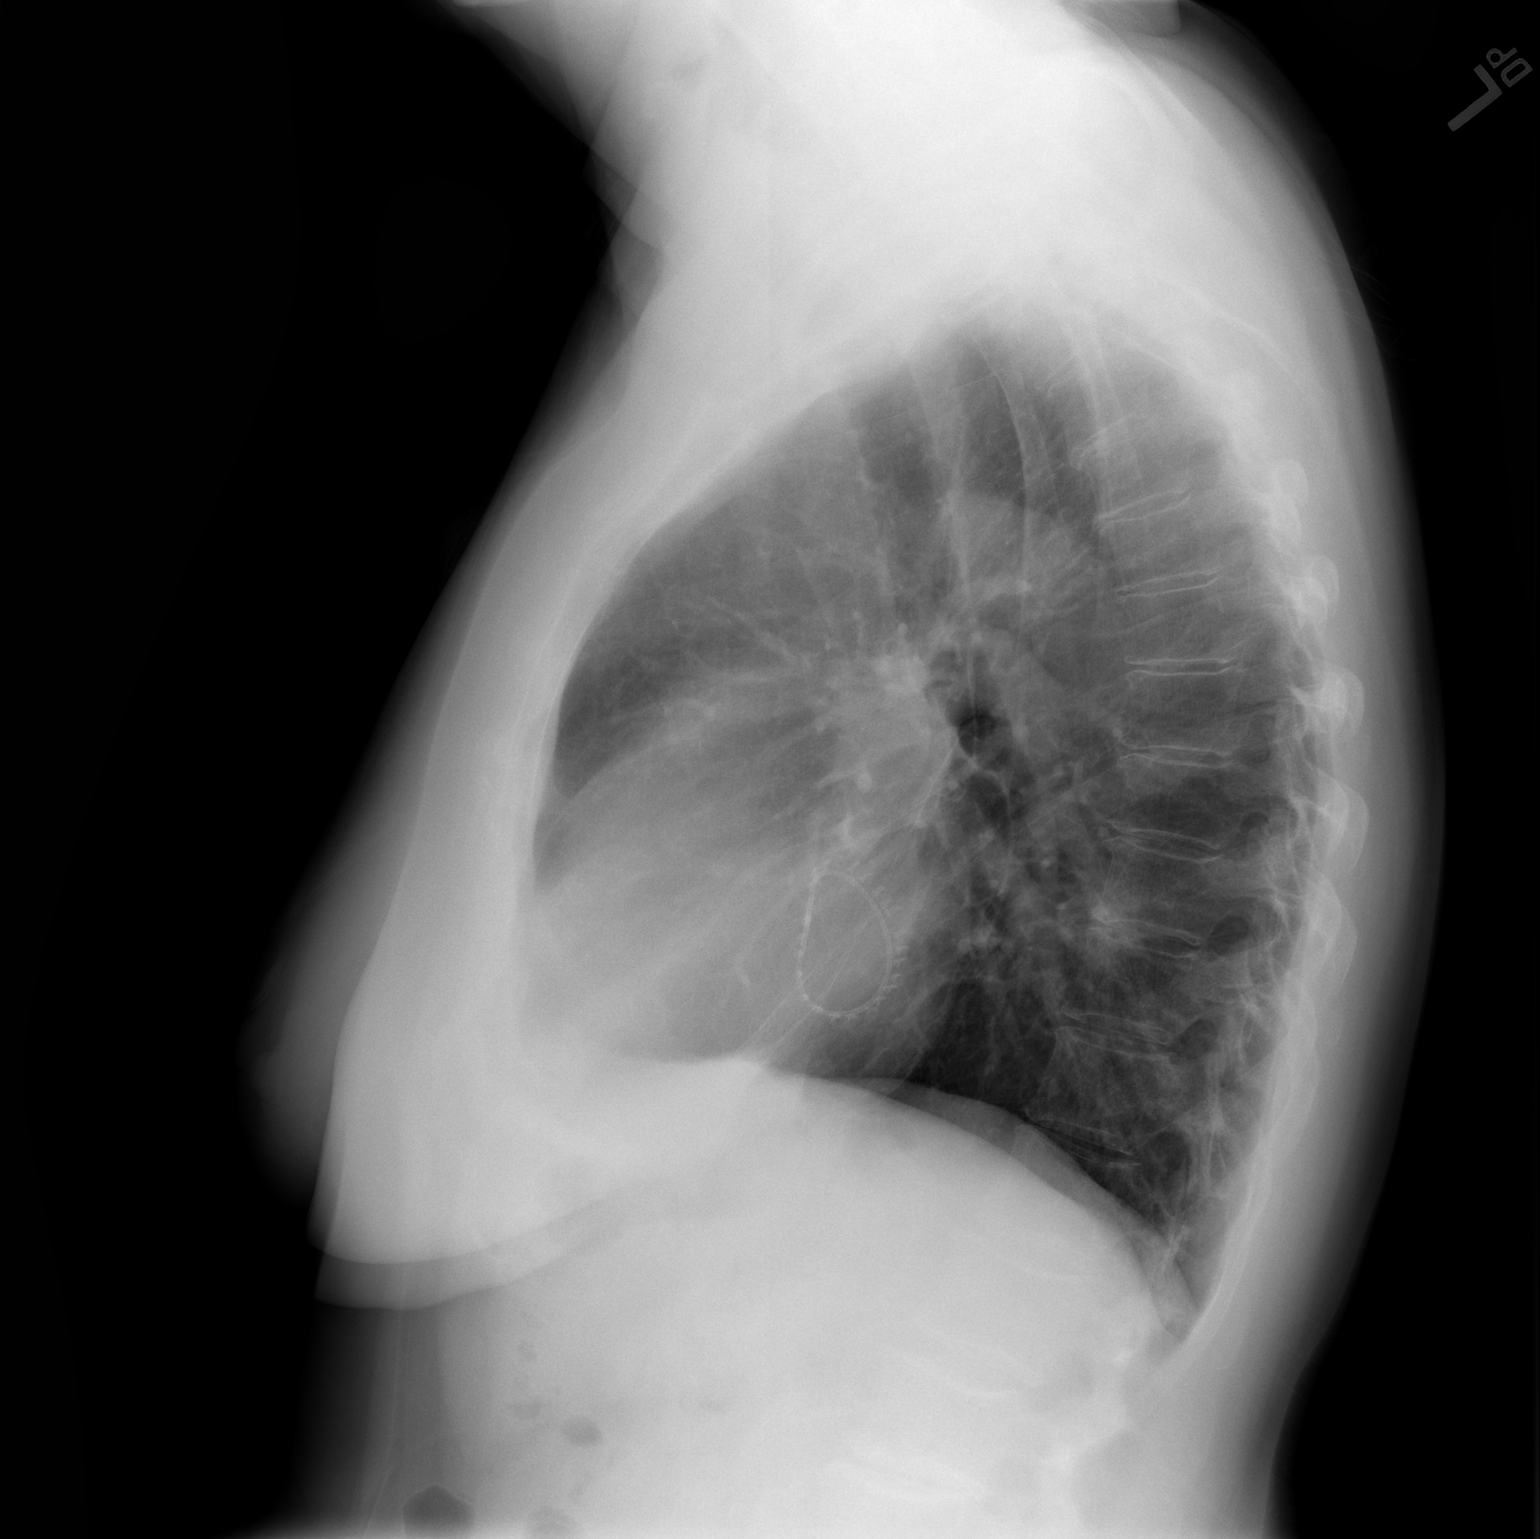

[2 of 2 positions shown; findings below may reference images not displayed]

FINDINGS: Stable cardiomediastinal silhouette. Status post cardiac valve
repair. No pneumothorax or pleural effusion noted. Both lungs are
clear. The visualized skeletal structures are unremarkable.
IMPRESSION: No active cardiopulmonary disease.

## 2020-05-22 DIAGNOSIS — D2372 Other benign neoplasm of skin of left lower limb, including hip: Secondary | ICD-10-CM | POA: Diagnosis not present

## 2020-05-22 DIAGNOSIS — M792 Neuralgia and neuritis, unspecified: Secondary | ICD-10-CM | POA: Diagnosis not present

## 2020-05-22 DIAGNOSIS — M21611 Bunion of right foot: Secondary | ICD-10-CM | POA: Diagnosis not present

## 2020-05-22 DIAGNOSIS — M21612 Bunion of left foot: Secondary | ICD-10-CM | POA: Diagnosis not present

## 2020-06-21 ENCOUNTER — Inpatient Hospital Stay: Payer: PPO

## 2020-06-24 ENCOUNTER — Inpatient Hospital Stay: Payer: PPO | Admitting: Internal Medicine

## 2020-06-25 ENCOUNTER — Inpatient Hospital Stay: Payer: PPO | Attending: Internal Medicine

## 2020-06-25 ENCOUNTER — Other Ambulatory Visit: Payer: Self-pay

## 2020-06-25 ENCOUNTER — Ambulatory Visit (HOSPITAL_COMMUNITY)
Admission: RE | Admit: 2020-06-25 | Discharge: 2020-06-25 | Disposition: A | Discharge status: Home / Self Care | Payer: PPO | Source: Ambulatory Visit | Attending: Physician Assistant | Admitting: Physician Assistant

## 2020-06-25 DIAGNOSIS — C3432 Malignant neoplasm of lower lobe, left bronchus or lung: Secondary | ICD-10-CM | POA: Insufficient documentation

## 2020-06-25 DIAGNOSIS — I341 Nonrheumatic mitral (valve) prolapse: Secondary | ICD-10-CM | POA: Insufficient documentation

## 2020-06-25 DIAGNOSIS — I5032 Chronic diastolic (congestive) heart failure: Secondary | ICD-10-CM | POA: Insufficient documentation

## 2020-06-25 DIAGNOSIS — C3492 Malignant neoplasm of unspecified part of left bronchus or lung: Secondary | ICD-10-CM

## 2020-06-25 DIAGNOSIS — R911 Solitary pulmonary nodule: Secondary | ICD-10-CM | POA: Diagnosis not present

## 2020-06-25 DIAGNOSIS — F32A Depression, unspecified: Secondary | ICD-10-CM | POA: Diagnosis not present

## 2020-06-25 DIAGNOSIS — Z79899 Other long term (current) drug therapy: Secondary | ICD-10-CM | POA: Diagnosis not present

## 2020-06-25 DIAGNOSIS — C3490 Malignant neoplasm of unspecified part of unspecified bronchus or lung: Secondary | ICD-10-CM | POA: Diagnosis not present

## 2020-06-25 LAB — CBC WITH DIFFERENTIAL (CANCER CENTER ONLY)
Abs Immature Granulocytes: 0.02 10*3/uL (ref 0.00–0.07)
Basophils Absolute: 0.1 10*3/uL (ref 0.0–0.1)
Basophils Relative: 1 %
Eosinophils Absolute: 0.2 10*3/uL (ref 0.0–0.5)
Eosinophils Relative: 4 %
HCT: 43.4 % (ref 36.0–46.0)
Hemoglobin: 14.1 g/dL (ref 12.0–15.0)
Immature Granulocytes: 0 %
Lymphocytes Relative: 22 %
Lymphs Abs: 1.2 10*3/uL (ref 0.7–4.0)
MCH: 29.6 pg (ref 26.0–34.0)
MCHC: 32.5 g/dL (ref 30.0–36.0)
MCV: 91 fL (ref 80.0–100.0)
Monocytes Absolute: 0.6 10*3/uL (ref 0.1–1.0)
Monocytes Relative: 11 %
Neutro Abs: 3.4 10*3/uL (ref 1.7–7.7)
Neutrophils Relative %: 62 %
Platelet Count: 349 10*3/uL (ref 150–400)
RBC: 4.77 MIL/uL (ref 3.87–5.11)
RDW: 12.7 % (ref 11.5–15.5)
WBC Count: 5.5 10*3/uL (ref 4.0–10.5)
nRBC: 0 % (ref 0.0–0.2)

## 2020-06-25 LAB — CMP (CANCER CENTER ONLY)
ALT: 18 U/L (ref 0–44)
AST: 17 U/L (ref 15–41)
Albumin: 3.8 g/dL (ref 3.5–5.0)
Alkaline Phosphatase: 73 U/L (ref 38–126)
Anion gap: 9 (ref 5–15)
BUN: 17 mg/dL (ref 8–23)
CO2: 24 mmol/L (ref 22–32)
Calcium: 9.2 mg/dL (ref 8.9–10.3)
Chloride: 108 mmol/L (ref 98–111)
Creatinine: 1.14 mg/dL — ABNORMAL HIGH (ref 0.44–1.00)
GFR, Estimated: 51 mL/min — ABNORMAL LOW (ref >60)
Glucose, Bld: 92 mg/dL (ref 70–99)
Potassium: 4.2 mmol/L (ref 3.5–5.1)
Sodium: 141 mmol/L (ref 135–145)
Total Bilirubin: 0.7 mg/dL (ref 0.3–1.2)
Total Protein: 7.2 g/dL (ref 6.5–8.1)

## 2020-06-25 MED ORDER — SODIUM CHLORIDE (PF) 0.9 % IJ SOLN
INTRAMUSCULAR | Status: AC
  Filled 2020-06-25: qty 50

## 2020-06-25 MED ORDER — IOHEXOL 300 MG/ML  SOLN
75.0000 mL | Freq: Once | INTRAMUSCULAR | Status: AC | PRN
  Administered 2020-06-25: 75 mL via INTRAVENOUS

## 2020-06-27 ENCOUNTER — Other Ambulatory Visit: Payer: Self-pay

## 2020-06-27 ENCOUNTER — Encounter: Payer: Self-pay | Admitting: Internal Medicine

## 2020-06-27 ENCOUNTER — Inpatient Hospital Stay (HOSPITAL_BASED_OUTPATIENT_CLINIC_OR_DEPARTMENT_OTHER): Payer: PPO | Admitting: Internal Medicine

## 2020-06-27 VITALS — BP 116/73 | HR 93 | Temp 97.0°F | Resp 17 | Ht 62.5 in | Wt 158.1 lb

## 2020-06-27 DIAGNOSIS — C3432 Malignant neoplasm of lower lobe, left bronchus or lung: Secondary | ICD-10-CM | POA: Diagnosis not present

## 2020-06-27 DIAGNOSIS — C3492 Malignant neoplasm of unspecified part of left bronchus or lung: Secondary | ICD-10-CM

## 2020-06-27 DIAGNOSIS — C3490 Malignant neoplasm of unspecified part of unspecified bronchus or lung: Secondary | ICD-10-CM

## 2020-06-27 NOTE — Progress Notes (Signed)
[place] [phone]) [phone]   [phone]  OFFICE PROGRESS NOTE  [name], MD [place] [address]  DIAGNOSIS: Stage IA (T1c, N0, M0) non-small cell lung cancer, adenocarcinoma diagnosed in May 2021.  PRIOR THERAPY: Status post left lower lobectomy with lymph node dissection under the care of Dr. Roxan Hockey on 11/23/2019.  CURRENT THERAPY: Observation.  INTERVAL HISTORY: EMER ONNEN 74 y.o. female returns to the clinic today for follow-up visit.  The patient is feeling fine today with no concerning complaints.  She denied having any chest pain, shortness of breath, cough or hemoptysis.  She denied having any fever or chills.  She has no nausea, vomiting, diarrhea or constipation.  She has no headache or visual changes.  She is here today for evaluation with repeat CT scan of the chest for restaging of her disease.  MEDICAL HISTORY: Past Medical History:  Diagnosis Date  . ADD (attention deficit disorder)   . Arthritis    "right knee" (04/19/2013)  . Asthma 04/03/2019  . CHF (congestive heart failure) ([place])   . Chronic diastolic congestive heart failure ([place])   . Claustrophobia   . Depression   . Dyspnea   . Dysrhythmia   . Fracture of right tibial plateau 05/2016  . GERD (gastroesophageal reflux disease)   . Heart murmur   . Incidental pulmonary nodule, greater than or equal to 89mm 06/06/2019   Needs f/u CT in 3 months  . Migraine    "bad when I was in 5th or 6th grade; I can usually ward them off by resting my head now" (04/19/2013)  . MVP (mitral valve prolapse)   . S/P minimally-invasive mitral valve repair 08/03/2019   Complex valvuloplasty including artificial Gore-tex neochords x12 and Sorin Memo 4D ring annuloplasty, size 38 mm  . S/P patent foramen ovale closure 08/03/2019  . Sleep apnea    has not used mouth guard in 5 years no cpap used   . Squamous carcinoma ?1999   "upper left groin" (04/19/2013)     ALLERGIES:  is allergic to penicillins, codeine, milk-related compounds, and morphine and related.  MEDICATIONS:  Current Outpatient Medications  Medication Sig Dispense Refill  . acetaminophen (TYLENOL) 500 MG tablet Take 500-1,000 mg by mouth every 6 (six) hours as needed for moderate pain (pain).     [name] albuterol (PROVENTIL) (2.5 MG/3ML) 0.083% nebulizer solution Take 2.5 mg by nebulization every 4 (four) hours as needed for wheezing or shortness of breath.     . Alum Hydroxide-Mag Carbonate (GAVISCON EXTRA STRENGTH) 160-105 MG CHEW Chew 1 tablet by mouth 2 (two) times daily as needed (indigestion).     [name] aspirin EC 81 MG EC tablet Take 1 tablet (81 mg total) by mouth daily.    . Biotin 5000 MCG TABS Take 5,000 mcg by mouth daily.    [name] BREO ELLIPTA 100-25 MCG/INH AEPB USE 1 PUFF DAILY AS DIRECTED 60 each 0  . buPROPion (WELLBUTRIN XL) 300 MG 24 hr tablet Take 300 mg by mouth daily.    . Calcium-Magnesium-Zinc 333-133-5 MG TABS Take 1-3 tablets by mouth daily at 12 noon.    . docusate sodium (COLACE) 100 MG capsule Take 100 mg by mouth daily as needed for mild constipation.    [name] doxycycline (VIBRAMYCIN) 100 MG capsule Take 1 capsule (100 mg total) one hour prior to any dental procedure 2 capsule 11  . montelukast (SINGULAIR) 10 MG tablet Take 10 mg by  mouth daily as needed (respiratory issues.).    [name] Multiple Vitamins-Minerals (CENTRUM SILVER 50+WOMEN PO) Take 1 tablet by mouth daily.    . Vitamin D, Ergocalciferol, (DRISDOL) 1.25 MG (50000 UT) CAPS capsule Take 50,000 Units by mouth 2 (two) times a week. Sundays & Thursdays.     No current facility-administered medications for this visit.    SURGICAL HISTORY:  Past Surgical History:  Procedure Laterality Date  . CARDIAC CATHETERIZATION  05/26/2019  . CHOLECYSTECTOMY  04/18/2013  . CHOLECYSTECTOMY N/A 04/18/2013   Procedure: LAPAROSCOPIC CHOLECYSTECTOMY WITH INTRAOPERATIVE CHOLANGIOGRAM;  Surgeon: Ralene Ok, MD;  Location: [facility];  Service: General;  Laterality: N/A;  . COLONOSCOPY W/ POLYPECTOMY    . EYE SURGERY Bilateral FALL  2016   IOC FOR CATARACTS  . FRACTURE SURGERY    . INTERCOSTAL NERVE BLOCK Left 11/23/2019   Procedure: Intercostal Nerve Block;  Surgeon: Melrose Nakayama, MD;  Location: [facility];  Service: Thoracic;  Laterality: Left;  . JOINT REPLACEMENT    . KNEE ARTHROSCOPY Right 12/2007   "meniscus repair" (04/19/2013)  . LYMPH NODE DISSECTION Left 11/23/2019   Procedure: Lymph Node Dissection;  Surgeon: Melrose Nakayama, MD;  Location: [facility];  Service: Thoracic;  Laterality: Left;  . MITRAL VALVE REPAIR Right 08/03/2019   Procedure: MINIMALLY INVASIVE MITRAL VALVE REPAIR (MVR) using Memo 4D 38 MM Mitral Valve.;  Surgeon: Rexene Alberts, MD;  Location: [facility];  Service: Open Heart Surgery;  Laterality: Right;  . REPAIR OF PATENT FORAMEN OVALE N/A 08/03/2019   Procedure: CLOSURE OF PATENT FORAMEN OVALE;  Surgeon: Rexene Alberts, MD;  Location: [facility];  Service: Open Heart Surgery;  Laterality: N/A;  . RETINAL TEAR REPAIR CRYOTHERAPY Left ~ 1997  . RIGHT/LEFT HEART CATH AND CORONARY ANGIOGRAPHY N/A 05/26/2019   Procedure: RIGHT/LEFT HEART CATH AND CORONARY ANGIOGRAPHY;  Surgeon: Jettie Booze, MD;  Location: [facility];  Service: Cardiovascular;  Laterality: N/A;  . TEE WITHOUT CARDIOVERSION N/A 05/26/2019   Procedure: TRANSESOPHAGEAL ECHOCARDIOGRAM (TEE);  Surgeon: Josue Hector, MD;  Location: [facility];  Service: Cardiovascular;  Laterality: N/A;  . TEE WITHOUT CARDIOVERSION N/A 08/03/2019   Procedure: TRANSESOPHAGEAL ECHOCARDIOGRAM (TEE);  Surgeon: Rexene Alberts, MD;  Location: [facility];  Service: Open Heart Surgery;  Laterality: N/A;  . TONSILLECTOMY  ~ 1952  . TOTAL KNEE ARTHROPLASTY Right 09/21/2016   Procedure: RIGHT TOTAL KNEE ARTHROPLASTY;  Surgeon: Gaynelle Arabian, MD;  Location: [facility];  Service: Orthopedics;  Laterality: Right;  requests 35mins with abductor block  .  VARICOSE VEIN SURGERY Right 2000's  . VASCULAR SURGERY    . WRIST SURGERY Right 1975   "had a knot taken off" (04/19/2013)    REVIEW OF SYSTEMS:  A comprehensive review of systems was negative.   PHYSICAL EXAMINATION: General appearance: alert, cooperative and no distress Head: Normocephalic, without obvious abnormality, atraumatic Neck: no adenopathy, no JVD, supple, symmetrical, trachea midline and thyroid not enlarged, symmetric, no tenderness/mass/nodules Lymph nodes: Cervical, supraclavicular, and axillary nodes normal. Resp: clear to auscultation bilaterally Back: symmetric, no curvature. ROM normal. No CVA tenderness. Cardio: regular rate and rhythm, S1, S2 normal, no murmur, click, rub or gallop GI: soft, non-tender; bowel sounds normal; no masses,  no organomegaly Extremities: extremities normal, atraumatic, no cyanosis or edema  ECOG PERFORMANCE STATUS: 1 - Symptomatic but completely ambulatory  Blood pressure 116/73, pulse 93, temperature (!) 97 F (36.1 C), temperature source Tympanic, resp. rate 17, height 5' 2.5" (1.588 m),  weight 158 lb 1.6 oz (71.7 kg), SpO2 100 %.  LABORATORY DATA: Lab Results  Component Value Date   WBC 5.5 06/25/2020   HGB 14.1 06/25/2020   HCT 43.4 06/25/2020   MCV 91.0 06/25/2020   PLT 349 06/25/2020      Chemistry      Component Value Date/Time   NA 141 06/25/2020 1134   NA 140 05/12/2019 1243   K 4.2 06/25/2020 1134   CL 108 06/25/2020 1134   CO2 24 06/25/2020 1134   BUN 17 06/25/2020 1134   BUN 14 05/12/2019 1243   CREATININE 1.14 (H) 06/25/2020 1134   CREATININE 1.05 (H) 07/19/2019 1206      Component Value Date/Time   CALCIUM 9.2 06/25/2020 1134   ALKPHOS 73 06/25/2020 1134   AST 17 06/25/2020 1134   ALT 18 06/25/2020 1134   BILITOT 0.7 06/25/2020 1134       RADIOGRAPHIC STUDIES: CT Chest W Contrast  Result Date: 06/26/2020 CLINICAL DATA:  Lung cancer restaging, stage I A EXAM: CT CHEST WITH CONTRAST TECHNIQUE:  Multidetector CT imaging of the chest was performed during intravenous contrast administration. CONTRAST:  61mL OMNIPAQUE IOHEXOL 300 MG/ML  SOLN COMPARISON:  10/02/2019 FINDINGS: Cardiovascular: No significant vascular findings. Normal heart size. No pericardial effusion. Mediastinum/Nodes: No enlarged mediastinal, hilar, or axillary lymph nodes. Small hiatal hernia. Thyroid gland, trachea, and esophagus demonstrate no significant findings. Lungs/Pleura: Status post interval left lower lobectomy. Multiple small ground-glass pulmonary opacities in the remaining lobes are not significantly changed, for example a 1.0 cm nodule of the left pulmonary apex (series 7, image 31) and a 0.8 cm subpleural nodule of the dependent right lower lobe (series 7, image 88). There is a calcified subpleural nodule of the right lung base, unchanged compared to prior examination and measuring 1.4 cm (series 7, image 113). No pleural effusion or pneumothorax. Upper Abdomen: No acute abnormality. Musculoskeletal: No chest wall mass or suspicious bone lesions identified. IMPRESSION: 1. Status post interval left lower lobectomy. 2. Multiple small ground-glass pulmonary opacities in the remaining lobes are not significantly changed, for example a 1.0 cm nodule of the left pulmonary apex and a 0.8 cm subpleural nodule of the dependent right lower lobe. These are nonspecific and possibly infectious or inflammatory in nature but recommend continued attention on follow-up as multifocal adenocarcinoma is not excluded. 3. There is a calcified subpleural nodule of the right lung base, unchanged compared to prior examination and measuring 1.4 cm, most likely benign. Electronically Signed   By: Eddie Candle M.D.   On: 06/26/2020 10:57    ASSESSMENT AND PLAN: This is a very pleasant 74 years old white female diagnosed with a stage Ia (T1c, N0, M0) non-small cell lung cancer, adenocarcinoma status post left lower lobectomy with lymph node dissection  in May 2021 under the care of Dr. Roxan Hockey. The patient is current on observation and she is feeling fine. She had repeat CT scan of the chest performed recently.  I personally and independently reviewed the scan and discussed the results with the patient today. Her scan showed no concerning findings for disease recurrence or metastasis. I recommended for her to continue on observation with repeat CT scan of the chest in 6 months. The patient was advised to call immediately if she has any concerning symptoms in the interval. The patient voices understanding of current disease status and treatment options and is in agreement with the current care plan.  All questions were answered. The patient knows to  call the clinic with any problems, questions or concerns. We can certainly see the patient much sooner if necessary.  Disclaimer: This note was dictated with voice recognition software. Similar sounding words can inadvertently be transcribed and may not be corrected upon review.

## 2020-07-03 DIAGNOSIS — L821 Other seborrheic keratosis: Secondary | ICD-10-CM | POA: Diagnosis not present

## 2020-07-03 DIAGNOSIS — L570 Actinic keratosis: Secondary | ICD-10-CM | POA: Diagnosis not present

## 2020-07-03 DIAGNOSIS — L814 Other melanin hyperpigmentation: Secondary | ICD-10-CM | POA: Diagnosis not present

## 2020-07-03 DIAGNOSIS — L820 Inflamed seborrheic keratosis: Secondary | ICD-10-CM | POA: Diagnosis not present

## 2020-08-06 ENCOUNTER — Encounter: Payer: PPO | Admitting: Thoracic Surgery (Cardiothoracic Vascular Surgery)

## 2020-08-12 ENCOUNTER — Encounter: Payer: Self-pay | Admitting: Thoracic Surgery (Cardiothoracic Vascular Surgery)

## 2020-08-12 ENCOUNTER — Other Ambulatory Visit: Payer: Self-pay

## 2020-08-12 ENCOUNTER — Ambulatory Visit: Payer: PPO | Admitting: Thoracic Surgery (Cardiothoracic Vascular Surgery)

## 2020-08-12 VITALS — BP 108/69 | HR 92 | Temp 97.9°F | Resp 18 | Wt 155.0 lb

## 2020-08-12 DIAGNOSIS — Z9889 Other specified postprocedural states: Secondary | ICD-10-CM

## 2020-08-12 DIAGNOSIS — Z902 Acquired absence of lung [part of]: Secondary | ICD-10-CM

## 2020-08-12 NOTE — Patient Instructions (Signed)

## 2020-08-12 NOTE — Progress Notes (Signed)
[address]       [place] [address]             [phone]     CARDIOTHORACIC SURGERY OFFICE NOTE  Primary Cardiologist is Will Meredith Leeds, MD PCP is [name], MD   HPI:  Patient is a 75 year old female with history of mitral valve prolapse, mitral regurgitation, palpitations, adenocarcinoma of the lung s/p surgical resection and remote history of tobacco use who returns the office today for routine follow-up more than one year s/p minimally-invasive mitral valve repair on 08/03/2019.    Prior to surgery she was found to have large groundglass opacity with some degree of solid component in the left lower lobe that was hypermetabolic on PET imaging and suspicious for primary bronchogenic carcinoma.  She recovered from her mitral valve repair uneventfully and routine follow-up echocardiogram performed September 13, 2019 revealed intact mitral valve repair with no residual mitral regurgitation and normal left ventricular systolic function.  She subsequently underwent robotic assisted left lower lobectomy by Dr. Roxan Hockey on Nov 23, 2019.[name]  Final pathology was consistent with stage Ia (T1, N0) adenocarcinoma.  She recovered uneventfully.  She recently was seen in follow-up by Dr. Earlie Server in follow-up CT scan of the chest was performed June 25, 2020 revealing stable appearance of multiple small groundglass opacities in the apex of the left lung in the dependent portion of the right lower lobe.  Patient returns her office today and reports that she is doing quite well.  Her basics complaint is that of pain and stiffness in her right knee, for which she underwent total knee replacement several years ago.  She admits that she has not been active physically.  She never enrolled in the cardiac rehab program either prior to or after her lobectomy.  She is not walking on a regular basis.  However, she reports that she clinically feels quite well.  She states that she  only gets short of breath with strenuous exertion and this is associated with only slight limitation.  Overall her breathing is notably much improved in comparison with how she felt prior to her mitral valve repair surgery.  Overall she has no complaints and she is pleased with her outcome.   Current Outpatient Medications  Medication Sig Dispense Refill  . acetaminophen (TYLENOL) 500 MG tablet Take 500-1,000 mg by mouth every 6 (six) hours as needed for moderate pain (pain).     [name] albuterol (PROVENTIL) (2.5 MG/3ML) 0.083% nebulizer solution Take 2.5 mg by nebulization every 4 (four) hours as needed for wheezing or shortness of breath.     . Alum Hydroxide-Mag Carbonate (GAVISCON EXTRA STRENGTH) 160-105 MG CHEW Chew 1 tablet by mouth 2 (two) times daily as needed (indigestion).     [name] aspirin EC 81 MG EC tablet Take 1 tablet (81 mg total) by mouth daily.    . Biotin 5000 MCG TABS Take 5,000 mcg by mouth daily.    [name] BREO ELLIPTA 100-25 MCG/INH AEPB USE 1 PUFF DAILY AS DIRECTED 60 each 0  . Calcium-Magnesium-Zinc 333-133-5 MG TABS Take 1-3 tablets by mouth daily at 12 noon.    [name] doxycycline (VIBRAMYCIN) 100 MG capsule Take 1 capsule (100 mg total) one hour prior to any dental procedure 2 capsule 11  . montelukast (SINGULAIR) 10 MG tablet Take 10 mg by mouth daily as needed (respiratory issues.).    [name] Multiple Vitamins-Minerals (CENTRUM SILVER 50+WOMEN PO) Take 1 tablet by mouth daily.    [name]  Vitamin D, Ergocalciferol, (DRISDOL) 1.25 MG (50000 UT) CAPS capsule Take 50,000 Units by mouth 2 (two) times a week. Sundays & Thursdays.     No current facility-administered medications for this visit.      Physical Exam:   BP 108/69 (BP Location: Left Arm, Patient Position: Sitting, Cuff Size: Normal)   Pulse 92   Temp 97.9 F (36.6 C) (Skin)   Resp 18   Wt 155 lb (70.3 kg)   SpO2 98% Comment: RA  BMI 27.90 kg/m   General:  Well-appearing  Chest:   Clear to auscultation  CV:   Regular rate and  rhythm without murmur  Incisions:  Completely healed  Abdomen:  Soft nontender  Extremities:  Warm and well-perfused  Diagnostic Tests:  CT CHEST WITH CONTRAST  TECHNIQUE: Multidetector CT imaging of the chest was performed during intravenous contrast administration.  CONTRAST:  68mL OMNIPAQUE IOHEXOL 300 MG/ML  SOLN  COMPARISON:  10/02/2019  FINDINGS: Cardiovascular: No significant vascular findings. Normal heart size. No pericardial effusion.  Mediastinum/Nodes: No enlarged mediastinal, hilar, or axillary lymph nodes. Small hiatal hernia. Thyroid gland, trachea, and esophagus demonstrate no significant findings.  Lungs/Pleura: Status post interval left lower lobectomy. Multiple small ground-glass pulmonary opacities in the remaining lobes are not significantly changed, for example a 1.0 cm nodule of the left pulmonary apex (series 7, image 31) and a 0.8 cm subpleural nodule of the dependent right lower lobe (series 7, image 88). There is a calcified subpleural nodule of the right lung base, unchanged compared to prior examination and measuring 1.4 cm (series 7, image 113). No pleural effusion or pneumothorax.  Upper Abdomen: No acute abnormality.  Musculoskeletal: No chest wall mass or suspicious bone lesions identified.  IMPRESSION: 1. Status post interval left lower lobectomy. 2. Multiple small ground-glass pulmonary opacities in the remaining lobes are not significantly changed, for example a 1.0 cm nodule of the left pulmonary apex and a 0.8 cm subpleural nodule of the dependent right lower lobe. These are nonspecific and possibly infectious or inflammatory in nature but recommend continued attention on follow-up as multifocal adenocarcinoma is not excluded. 3. There is a calcified subpleural nodule of the right lung base, unchanged compared to prior examination and measuring 1.4 cm, most likely benign.   Electronically Signed   By: Eddie Candle  M.D.   On: 06/26/2020 10:57    Impression:  Patient is doing very well approximately 1 year status post minimally invasive mitral valve repair.  She is also doing remarkably well following robotic assisted left lower lobectomy for stage Ia adenocarcinoma of the lung.    Plan:  The patient will continue to follow-up with Dr. Earlie Server and Dr. Roxan Hockey for long-term surveillance following surgical treatment for her lung cancer.  She will continue to follow-up regularly with Dr. Curt Bears.  The patient has been reminded regarding the importance of dental hygiene and the lifelong need for antibiotic prophylaxis for all dental cleanings and other related invasive procedures.  All of her questions have been addressed.    I spent in excess of 15 minutes during the conduct of this office consultation and >50% of this time involved direct face-to-face encounter with the patient for counseling and/or coordination of their care.    Valentina Gu. Roxy Manns, MD 08/12/2020 4:52 PM

## 2020-08-16 IMAGING — CR DG CHEST 1V PORT
1 series · 1 of 1 positions shown · non-contrast
Comparison: November 20, 2019.

CLINICAL DATA: Status post left upper lobectomy.

EXAM:
PORTABLE CHEST 1 VIEW

[AP]
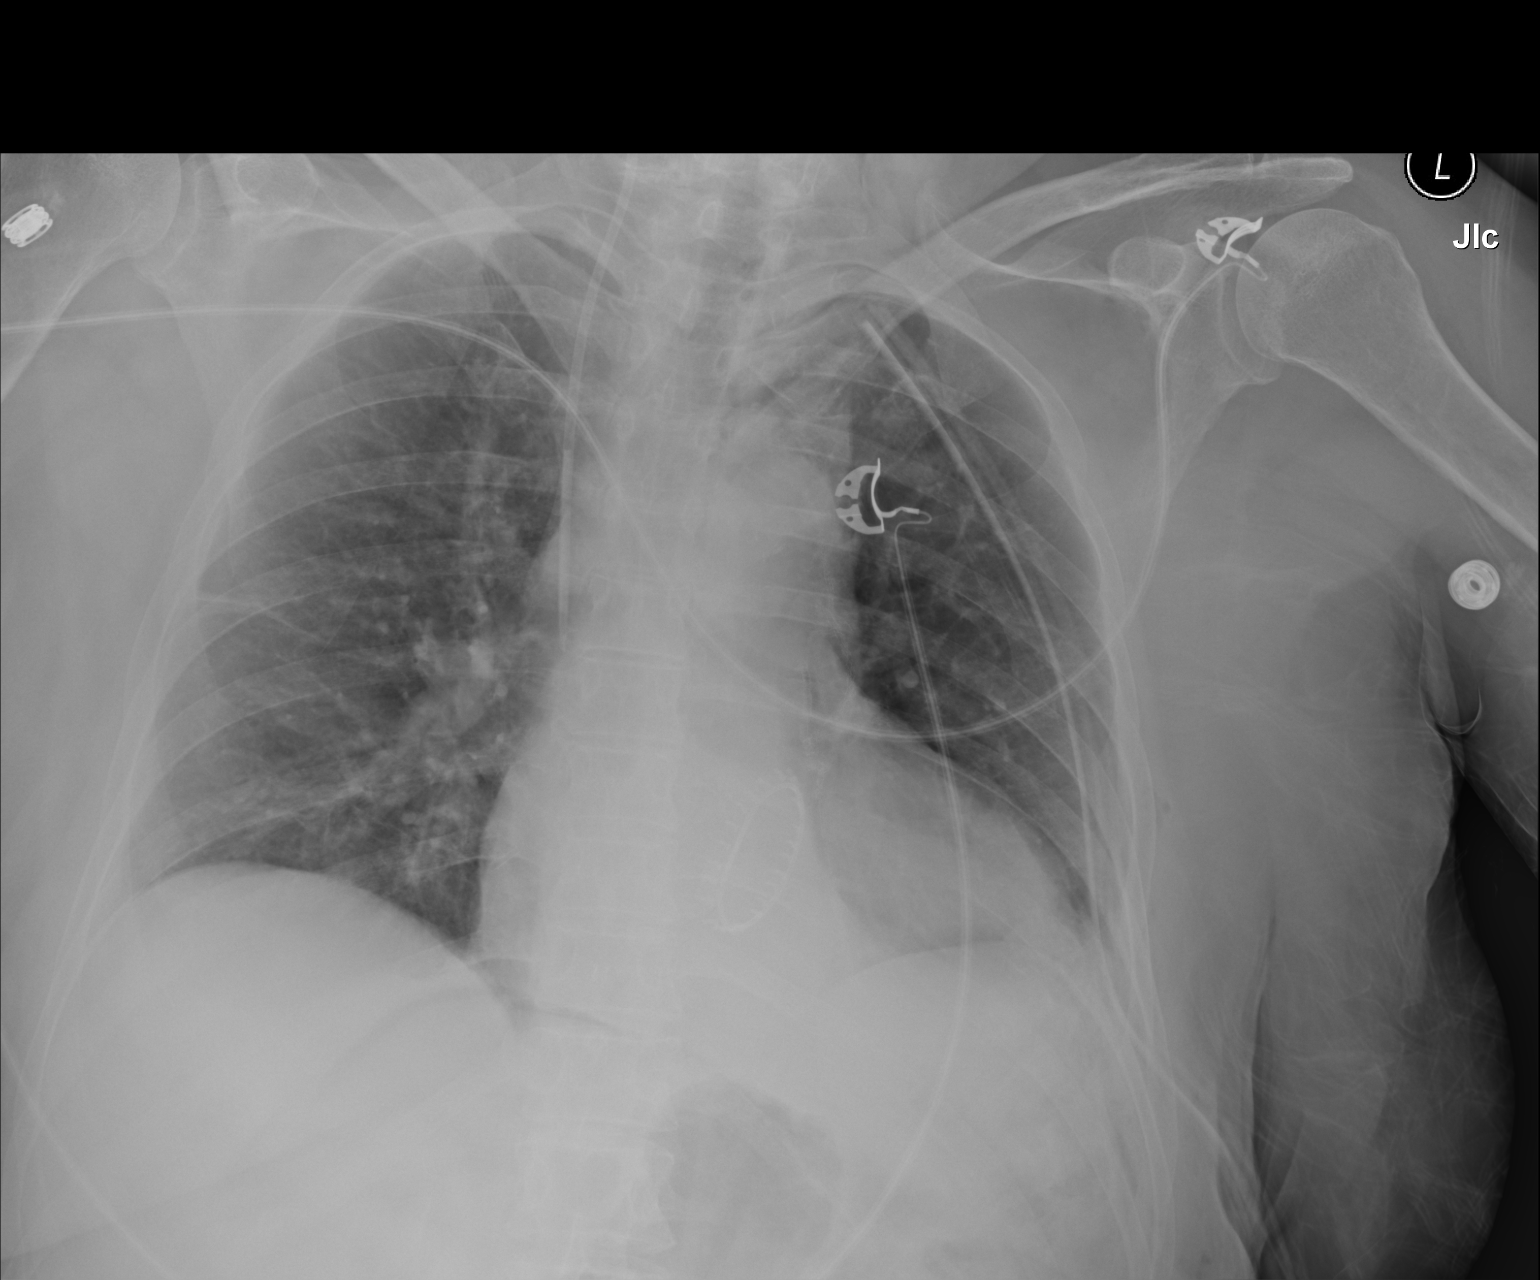

[1 of 1 positions shown; findings below may reference images not displayed]

FINDINGS: The heart size and mediastinal contours are within normal limits.
Left-sided chest tube is noted without pneumothorax. Right internal
jugular catheter is noted. Minimal bibasilar subsegmental
atelectasis is noted. Status post cardiac valve repair. The
visualized skeletal structures are unremarkable.
IMPRESSION: Left-sided chest tube is noted without pneumothorax. Minimal
bibasilar subsegmental atelectasis is noted.

## 2020-08-17 IMAGING — DX DG CHEST 1V PORT
1 series · 1 of 1 positions shown · non-contrast
Comparison: November 23, 2019

CLINICAL DATA: Postoperative lobectomy on the left with chest tube
in place.

EXAM:
PORTABLE CHEST 1 VIEW

[chest ap]
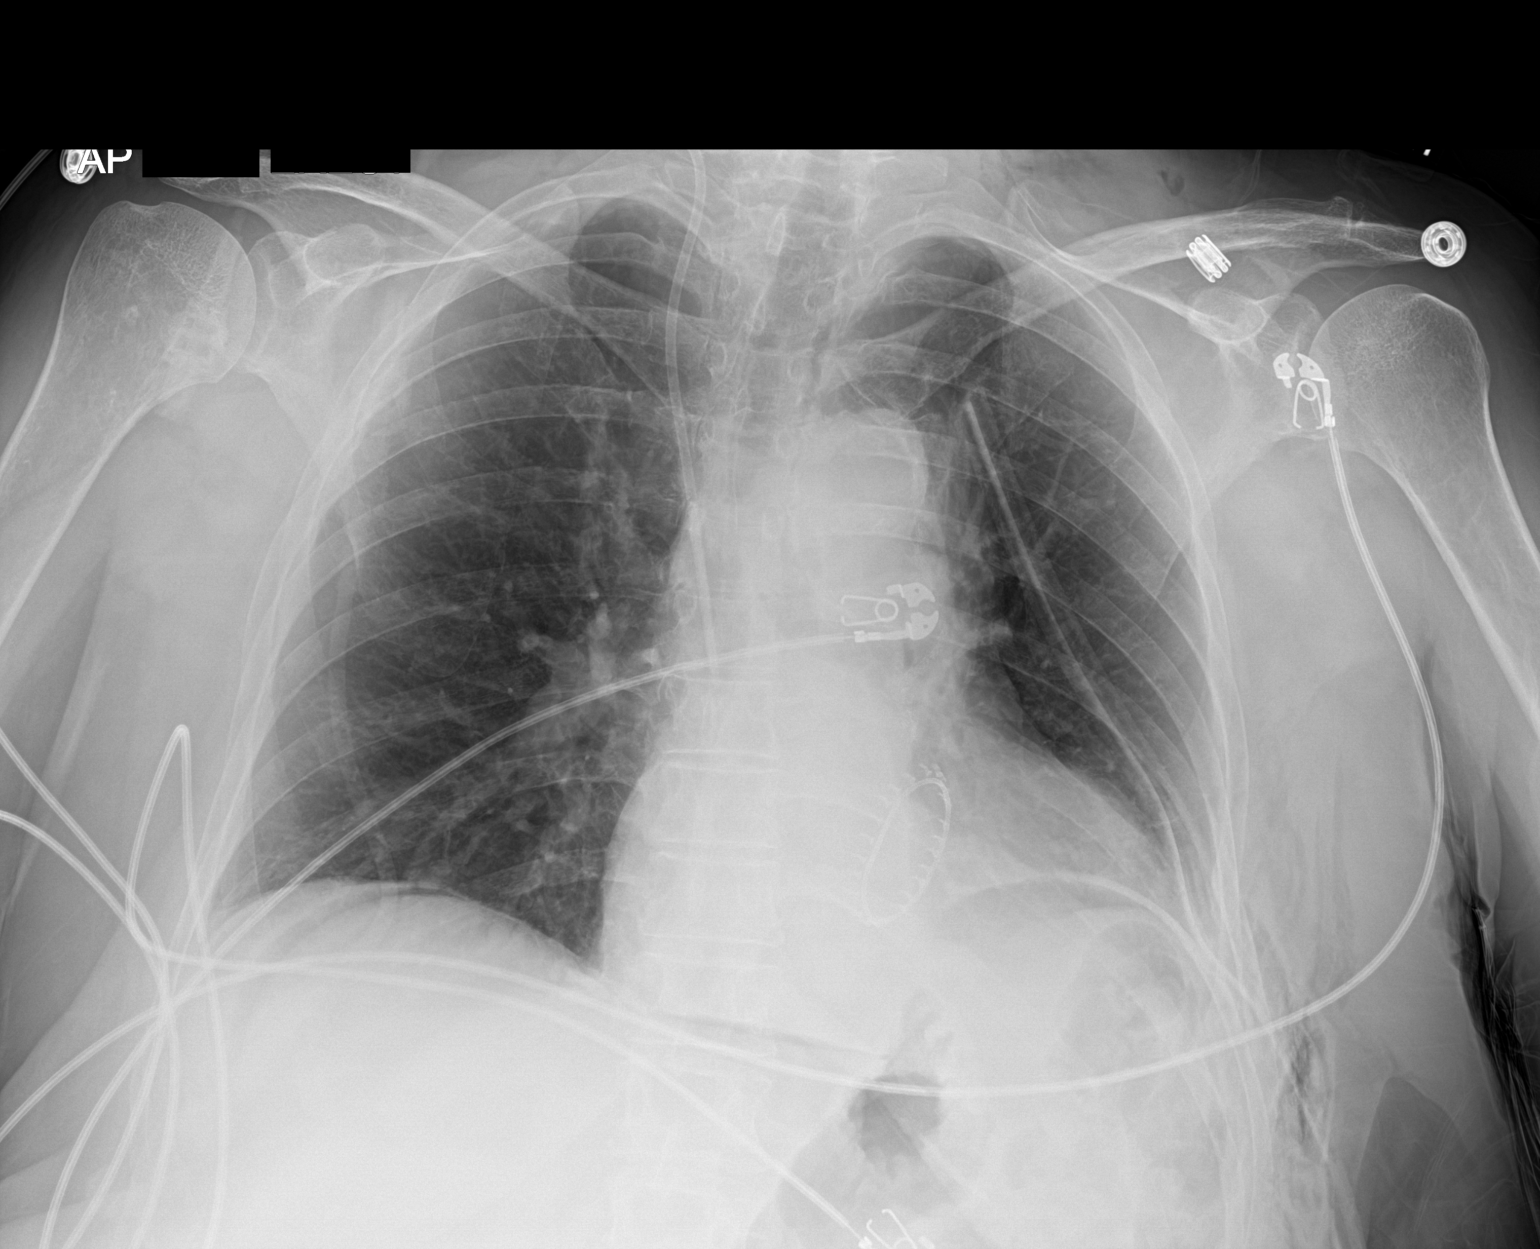

[1 of 1 positions shown; findings below may reference images not displayed]

FINDINGS: Chest tube remains on the left. Central catheter tip is in the
superior vena cava. No pneumothorax. Subcutaneous air on the left
appears slightly increased overall compared to 1 day prior.

There is questionable pneumoperitoneum.

There is no edema or airspace opacity. Heart is upper normal in size
with pulmonary vascularity normal. Patient is status post mitral
valve replacement. No adenopathy.
IMPRESSION: 1. Question of pneumoperitoneum. This finding warrants further
evaluation. Left lateral decubitus abdomen radiograph could be
helpful in this regard. CT of the abdomen and pelvis also could be
helpful in this regard.

2. Tube and catheter positions as described. No pneumothorax. There
is increase in subcutaneous air on the left, however.

3.  No edema or airspace opacity.

4.  Stable cardiac silhouette.

These results will be called to the ordering clinician or
representative by the Radiologist Assistant, and communication
documented in the PACS or [REDACTED].

## 2020-08-20 ENCOUNTER — Ambulatory Visit: Payer: PPO | Admitting: Thoracic Surgery (Cardiothoracic Vascular Surgery)

## 2020-08-20 ENCOUNTER — Other Ambulatory Visit: Payer: Self-pay

## 2020-08-20 VITALS — BP 103/68 | HR 88 | Resp 20 | Ht 62.5 in | Wt 144.0 lb

## 2020-08-20 DIAGNOSIS — C3492 Malignant neoplasm of unspecified part of left bronchus or lung: Secondary | ICD-10-CM | POA: Diagnosis not present

## 2020-08-20 DIAGNOSIS — Z902 Acquired absence of lung [part of]: Secondary | ICD-10-CM

## 2020-08-20 NOTE — Progress Notes (Signed)
[address]       [place] [address]             [phone]      HPI: Anne Hogan returns for a scheduled follow-up visit after left lower lobectomy.  Anne Hogan is a 75 year old woman with a remote history of tobacco abuse (quit 1988), mitral valve prolapse, mitral regurgitation, mitral valve repair, congestive heart failure, patent foramen ovale, arthritis, "asthma", sleep apnea, reflux, reflux, and a stage Ia adenocarcinoma of the lung.  She originally presented with shortness of breath and was found to be in congestive heart failure.  She had severe mitral regurgitation.  On her preoperative valuation she had a left lower lobe lung nodule.  She had a minimally invasive mitral valve repair by Dr. Roxy Manns.  I then did a robotic assisted left lower lobectomy on 11/23/2019.  The lung mass with a stage Ia adenocarcinoma.  I last saw her in the office back in July.  She was doing well at that time.  She saw Dr. Julien Nordmann in December.  CT showed no concerning findings.  There are some groundglass opacities that are being followed.  She saw Dr. Roxy Manns last week.  She is doing well from a cardiac standpoint.  In general she feels well.  Occasionally if she takes a deep breath or yawn she will feel a little discomfort under her left breast.  She says that a couple of nights ago she had some difficulty sleeping and had fair amount of pain yesterday.  It is much better today.  Past Medical History:  Diagnosis Date  . ADD (attention deficit disorder)   . Arthritis    "right knee" (04/19/2013)  . Asthma 04/03/2019  . CHF (congestive heart failure) ([facility])   . Chronic diastolic congestive heart failure ([facility])   . Claustrophobia   . Depression   . Dyspnea   . Dysrhythmia   . Fracture of right tibial plateau 05/2016  . GERD (gastroesophageal reflux disease)   . Heart murmur   . Incidental pulmonary nodule, greater than or equal to 74mm 06/06/2019   Needs f/u CT in 3 months  . Migraine     "bad when I was in 5th or 6th grade; I can usually ward them off by resting my head now" (04/19/2013)  . MVP (mitral valve prolapse)   . S/P minimally-invasive mitral valve repair 08/03/2019   Complex valvuloplasty including artificial Gore-tex neochords x12 and Sorin Memo 4D ring annuloplasty, size 38 mm  . S/P patent foramen ovale closure 08/03/2019  . Sleep apnea    has not used mouth guard in 5 years no cpap used   . Squamous carcinoma ?1999   "upper left groin" (04/19/2013)    Current Outpatient Medications  Medication Sig Dispense Refill  . acetaminophen (TYLENOL) 500 MG tablet Take 500-1,000 mg by mouth every 6 (six) hours as needed for moderate pain (pain).     . Alum Hydroxide-Mag Carbonate (GAVISCON EXTRA STRENGTH) 160-105 MG CHEW Chew 1 tablet by mouth 2 (two) times daily as needed (indigestion).     [name] aspirin EC 81 MG EC tablet Take 1 tablet (81 mg total) by mouth daily.    . Biotin 5000 MCG TABS Take 5,000 mcg by mouth daily.    [name] BREO ELLIPTA 100-25 MCG/INH AEPB USE 1 PUFF DAILY AS DIRECTED 60 each 0  . Calcium-Magnesium-Zinc 333-133-5 MG TABS Take 1-3 tablets by mouth daily at 12 noon.    [name]  doxycycline (VIBRAMYCIN) 100 MG capsule Take 1 capsule (100 mg total) one hour prior to any dental procedure 2 capsule 11  . montelukast (SINGULAIR) 10 MG tablet Take 10 mg by mouth daily as needed (respiratory issues.).    [name] Multiple Vitamins-Minerals (CENTRUM SILVER 50+WOMEN PO) Take 1 tablet by mouth daily.    . Vitamin D, Ergocalciferol, (DRISDOL) 1.25 MG (50000 UT) CAPS capsule Take 50,000 Units by mouth 2 (two) times a week. Sundays & Thursdays.     No current facility-administered medications for this visit.    Physical Exam BP 103/68   Pulse 88   Resp 20   Ht 5' 2.5" (1.588 m)   Wt 144 lb (65.3 kg)   SpO2 96% Comment: RA  BMI 25.19 kg/m  75 year old woman in no acute distress Alert and oriented x3 with no focal deficits Cardiac regular rate and rhythm with no  murmur Incisions well-healed Lungs diminished at left base otherwise clear  Diagnostic Tests: I reviewed her CT chest from December that was done by Dr. Julien Nordmann.  No evidence of recurrent disease.  There are couple of small groundglass opacities that are being monitored.  There is a calcified nodule in the right lower lobe that is likely an old granuloma.  Impression: Anne Hogan is a 75 year old woman who was found to have a left lower lobe lung nodule on CT of the chest that was done prior to mitral valve repair.  She does have a history of smoking the past but quit in 1988.  She had a mitral valve repair done minimally invasively by Dr. Roxy Manns and then couple of months later I did a robotic left lower lobectomy.   She is now about 9 months out from her lobectomy.  She is going to be followed by Dr.Mohamed.  She will have another CT in about June.  From a respiratory standpoint she is doing well.  She had some questions about some of her medications.  She is not using albuterol and also I took that off her list.  She had questions about whether she still needed to be on Breo or Singulair.  I recommended she check with Dr. Dagmar Hait about that but did tell her that if she wants to try stopping to do one at a time and see if she has any issues.  Her pulmonary function was normal so she probably does not need either of those medications.  Plan: Follow-up with Dr. Pamelia Hoit I will be happy to see [name] back anytime if I can be of any further assistance with her care  Melrose Nakayama, MD [facility] [phone]

## 2020-10-28 ENCOUNTER — Telehealth: Payer: Self-pay | Admitting: Cardiology

## 2020-10-28 DIAGNOSIS — R002 Palpitations: Secondary | ICD-10-CM

## 2020-10-28 NOTE — Telephone Encounter (Signed)
Patient c/o Palpitations:  High priority if patient c/o lightheadedness, shortness of breath, or chest pain  1) How long have you had palpitations/irregular HR/ Afib? Are you having the symptoms now? Months and only at night.  2) Are you currently experiencing lightheadedness, SOB or CP? No   3) Do you have a history of afib (atrial fibrillation) or irregular heart rhythm? Yes before surgery on occassions but not bad.  4) Have you checked your BP or HR? (document readings if available): HR was 64 on yesterday   5) Are you experiencing any other symptoms? No   PT states she has a machine by her bed that checks her heart rate she states she has been using it at night due to feeling she is having palpitations.She states it has not come back on the monitor that she is, but she wants to discuss wearing a heart monitor and scheduling a appt due to this.Please advise

## 2020-10-29 ENCOUNTER — Ambulatory Visit (INDEPENDENT_AMBULATORY_CARE_PROVIDER_SITE_OTHER): Payer: PPO

## 2020-10-29 ENCOUNTER — Encounter: Payer: Self-pay | Admitting: *Deleted

## 2020-10-29 DIAGNOSIS — R002 Palpitations: Secondary | ICD-10-CM

## 2020-10-29 NOTE — Telephone Encounter (Signed)
Pt reports being awoke from sleep sometimes, feels almost "like a vibrating/palpitating", "it is hard to explain the feeling". Occurs almost daily, vibration coming from left upper chest area. Aware I will discuss monitor with Dr. Curt Bears and let her know. Patient verbalized understanding and agreeable to plan.

## 2020-10-29 NOTE — Telephone Encounter (Signed)
Anne Hogan Dr. Curt Bears feels monitor is the best plan. Anne Hogan 2 week monitor will be mailed to her and instructions to be sent via mychart. Anne Hogan I will call once resulted and reviewed by MD. Patient verbalized understanding and agreeable to plan.

## 2020-10-29 NOTE — Progress Notes (Signed)
Patient ID: Anne Hogan, female   DOB: January 05, 1946, 75 y.o.   MRN: [id number] Patient enrolled for [employer] to ship a14 day ZIO XT monitor to her home.

## 2020-10-31 ENCOUNTER — Telehealth: Payer: Self-pay | Admitting: Cardiology

## 2020-10-31 NOTE — Telephone Encounter (Signed)
Spoke with patient about her receiving her heart monitor and recently testing positive for Covid, she was concerned if she should wait to put her heart monitor on.  This RN advised it would be okay to apply heart monitor now.

## 2020-10-31 NOTE — Telephone Encounter (Signed)
Patient states she received her heart monitor, but she just tested positive for covid. She would like to know if it okay for her to go ahead and put it on or wait.

## 2020-11-05 NOTE — Telephone Encounter (Signed)
Followed up with pt. She takes her last dose of Paxlovid tomorrow. She will start monitor Thursday/Friday

## 2020-11-08 DIAGNOSIS — R002 Palpitations: Secondary | ICD-10-CM | POA: Diagnosis not present

## 2020-11-08 NOTE — Telephone Encounter (Signed)
Called pt who reports that she already placed monitor. She called [employer] who guided her on getting placed. She appreciates my follow up.

## 2020-11-13 DIAGNOSIS — M1712 Unilateral primary osteoarthritis, left knee: Secondary | ICD-10-CM | POA: Diagnosis not present

## 2020-11-13 DIAGNOSIS — M25561 Pain in right knee: Secondary | ICD-10-CM | POA: Diagnosis not present

## 2020-11-15 DIAGNOSIS — N281 Cyst of kidney, acquired: Secondary | ICD-10-CM | POA: Diagnosis not present

## 2020-11-15 DIAGNOSIS — J45909 Unspecified asthma, uncomplicated: Secondary | ICD-10-CM | POA: Diagnosis not present

## 2020-11-15 DIAGNOSIS — M8580 Other specified disorders of bone density and structure, unspecified site: Secondary | ICD-10-CM | POA: Diagnosis not present

## 2020-11-15 DIAGNOSIS — I340 Nonrheumatic mitral (valve) insufficiency: Secondary | ICD-10-CM | POA: Diagnosis not present

## 2020-11-15 DIAGNOSIS — H9193 Unspecified hearing loss, bilateral: Secondary | ICD-10-CM | POA: Diagnosis not present

## 2020-11-15 DIAGNOSIS — K219 Gastro-esophageal reflux disease without esophagitis: Secondary | ICD-10-CM | POA: Diagnosis not present

## 2020-11-15 DIAGNOSIS — G4733 Obstructive sleep apnea (adult) (pediatric): Secondary | ICD-10-CM | POA: Diagnosis not present

## 2020-11-15 DIAGNOSIS — N1831 Chronic kidney disease, stage 3a: Secondary | ICD-10-CM | POA: Diagnosis not present

## 2020-11-15 DIAGNOSIS — E785 Hyperlipidemia, unspecified: Secondary | ICD-10-CM | POA: Diagnosis not present

## 2020-11-15 DIAGNOSIS — R7301 Impaired fasting glucose: Secondary | ICD-10-CM | POA: Diagnosis not present

## 2020-11-15 DIAGNOSIS — M179 Osteoarthritis of knee, unspecified: Secondary | ICD-10-CM | POA: Diagnosis not present

## 2020-11-15 DIAGNOSIS — R911 Solitary pulmonary nodule: Secondary | ICD-10-CM | POA: Diagnosis not present

## 2020-11-20 DIAGNOSIS — B351 Tinea unguium: Secondary | ICD-10-CM | POA: Diagnosis not present

## 2020-11-20 DIAGNOSIS — M792 Neuralgia and neuritis, unspecified: Secondary | ICD-10-CM | POA: Diagnosis not present

## 2020-11-20 DIAGNOSIS — M21612 Bunion of left foot: Secondary | ICD-10-CM | POA: Diagnosis not present

## 2020-11-20 DIAGNOSIS — M21611 Bunion of right foot: Secondary | ICD-10-CM | POA: Diagnosis not present

## 2020-11-25 DIAGNOSIS — Z96651 Presence of right artificial knee joint: Secondary | ICD-10-CM | POA: Diagnosis not present

## 2020-11-25 DIAGNOSIS — M25561 Pain in right knee: Secondary | ICD-10-CM | POA: Diagnosis not present

## 2020-11-25 DIAGNOSIS — M25562 Pain in left knee: Secondary | ICD-10-CM | POA: Diagnosis not present

## 2020-11-29 DIAGNOSIS — R531 Weakness: Secondary | ICD-10-CM | POA: Diagnosis not present

## 2020-11-29 DIAGNOSIS — R110 Nausea: Secondary | ICD-10-CM | POA: Diagnosis not present

## 2020-11-29 DIAGNOSIS — R002 Palpitations: Secondary | ICD-10-CM | POA: Diagnosis not present

## 2020-12-02 ENCOUNTER — Encounter: Payer: Self-pay | Admitting: Thoracic Surgery (Cardiothoracic Vascular Surgery)

## 2020-12-03 DIAGNOSIS — R110 Nausea: Secondary | ICD-10-CM | POA: Diagnosis not present

## 2020-12-11 DIAGNOSIS — M25561 Pain in right knee: Secondary | ICD-10-CM | POA: Diagnosis not present

## 2020-12-11 DIAGNOSIS — M25562 Pain in left knee: Secondary | ICD-10-CM | POA: Diagnosis not present

## 2020-12-13 DIAGNOSIS — M25561 Pain in right knee: Secondary | ICD-10-CM | POA: Diagnosis not present

## 2020-12-13 DIAGNOSIS — M25562 Pain in left knee: Secondary | ICD-10-CM | POA: Diagnosis not present

## 2020-12-24 ENCOUNTER — Ambulatory Visit (HOSPITAL_COMMUNITY)
Admission: RE | Admit: 2020-12-24 | Discharge: 2020-12-24 | Disposition: A | Discharge status: Home / Self Care | Payer: PPO | Source: Ambulatory Visit | Attending: Internal Medicine | Admitting: Internal Medicine

## 2020-12-24 ENCOUNTER — Other Ambulatory Visit: Payer: Self-pay

## 2020-12-24 ENCOUNTER — Inpatient Hospital Stay: Payer: PPO | Attending: Internal Medicine

## 2020-12-24 DIAGNOSIS — Z79899 Other long term (current) drug therapy: Secondary | ICD-10-CM | POA: Diagnosis not present

## 2020-12-24 DIAGNOSIS — C3432 Malignant neoplasm of lower lobe, left bronchus or lung: Secondary | ICD-10-CM | POA: Insufficient documentation

## 2020-12-24 DIAGNOSIS — C3490 Malignant neoplasm of unspecified part of unspecified bronchus or lung: Secondary | ICD-10-CM | POA: Insufficient documentation

## 2020-12-24 DIAGNOSIS — N281 Cyst of kidney, acquired: Secondary | ICD-10-CM | POA: Diagnosis not present

## 2020-12-24 DIAGNOSIS — K769 Liver disease, unspecified: Secondary | ICD-10-CM | POA: Diagnosis not present

## 2020-12-24 DIAGNOSIS — J439 Emphysema, unspecified: Secondary | ICD-10-CM | POA: Diagnosis not present

## 2020-12-24 DIAGNOSIS — I700 Atherosclerosis of aorta: Secondary | ICD-10-CM | POA: Insufficient documentation

## 2020-12-24 DIAGNOSIS — Z885 Allergy status to narcotic agent status: Secondary | ICD-10-CM | POA: Insufficient documentation

## 2020-12-24 DIAGNOSIS — Z8774 Personal history of (corrected) congenital malformations of heart and circulatory system: Secondary | ICD-10-CM | POA: Diagnosis not present

## 2020-12-24 DIAGNOSIS — I2510 Atherosclerotic heart disease of native coronary artery without angina pectoris: Secondary | ICD-10-CM | POA: Diagnosis not present

## 2020-12-24 DIAGNOSIS — Z9049 Acquired absence of other specified parts of digestive tract: Secondary | ICD-10-CM | POA: Diagnosis not present

## 2020-12-24 DIAGNOSIS — Z902 Acquired absence of lung [part of]: Secondary | ICD-10-CM | POA: Insufficient documentation

## 2020-12-24 DIAGNOSIS — Z880 Allergy status to penicillin: Secondary | ICD-10-CM | POA: Insufficient documentation

## 2020-12-24 DIAGNOSIS — I7789 Other specified disorders of arteries and arterioles: Secondary | ICD-10-CM | POA: Diagnosis not present

## 2020-12-24 LAB — CMP (CANCER CENTER ONLY)
ALT: 16 U/L (ref 0–44)
AST: 17 U/L (ref 15–41)
Albumin: 3.7 g/dL (ref 3.5–5.0)
Alkaline Phosphatase: 70 U/L (ref 38–126)
Anion gap: 9 (ref 5–15)
BUN: 15 mg/dL (ref 8–23)
CO2: 25 mmol/L (ref 22–32)
Calcium: 9.4 mg/dL (ref 8.9–10.3)
Chloride: 106 mmol/L (ref 98–111)
Creatinine: 1.09 mg/dL — ABNORMAL HIGH (ref 0.44–1.00)
GFR, Estimated: 53 mL/min — ABNORMAL LOW (ref >60)
Glucose, Bld: 99 mg/dL (ref 70–99)
Potassium: 4.6 mmol/L (ref 3.5–5.1)
Sodium: 140 mmol/L (ref 135–145)
Total Bilirubin: 0.9 mg/dL (ref 0.3–1.2)
Total Protein: 7 g/dL (ref 6.5–8.1)

## 2020-12-24 LAB — CBC WITH DIFFERENTIAL (CANCER CENTER ONLY)
Abs Immature Granulocytes: 0.02 10*3/uL (ref 0.00–0.07)
Basophils Absolute: 0.1 10*3/uL (ref 0.0–0.1)
Basophils Relative: 1 %
Eosinophils Absolute: 0.2 10*3/uL (ref 0.0–0.5)
Eosinophils Relative: 3 %
HCT: 42.9 % (ref 36.0–46.0)
Hemoglobin: 14.4 g/dL (ref 12.0–15.0)
Immature Granulocytes: 0 %
Lymphocytes Relative: 21 %
Lymphs Abs: 1.2 10*3/uL (ref 0.7–4.0)
MCH: 30.1 pg (ref 26.0–34.0)
MCHC: 33.6 g/dL (ref 30.0–36.0)
MCV: 89.7 fL (ref 80.0–100.0)
Monocytes Absolute: 0.6 10*3/uL (ref 0.1–1.0)
Monocytes Relative: 10 %
Neutro Abs: 3.8 10*3/uL (ref 1.7–7.7)
Neutrophils Relative %: 65 %
Platelet Count: 347 10*3/uL (ref 150–400)
RBC: 4.78 MIL/uL (ref 3.87–5.11)
RDW: 12.4 % (ref 11.5–15.5)
WBC Count: 5.9 10*3/uL (ref 4.0–10.5)
nRBC: 0 % (ref 0.0–0.2)

## 2020-12-24 MED ORDER — IOHEXOL 300 MG/ML  SOLN
75.0000 mL | Freq: Once | INTRAMUSCULAR | Status: AC | PRN
  Administered 2020-12-24: 75 mL via INTRAVENOUS

## 2020-12-24 MED ORDER — SODIUM CHLORIDE (PF) 0.9 % IJ SOLN
INTRAMUSCULAR | Status: AC
  Filled 2020-12-24: qty 50

## 2020-12-25 DIAGNOSIS — M25562 Pain in left knee: Secondary | ICD-10-CM | POA: Diagnosis not present

## 2020-12-25 DIAGNOSIS — Z96651 Presence of right artificial knee joint: Secondary | ICD-10-CM | POA: Diagnosis not present

## 2020-12-26 ENCOUNTER — Inpatient Hospital Stay: Payer: PPO | Admitting: Internal Medicine

## 2020-12-26 ENCOUNTER — Other Ambulatory Visit: Payer: Self-pay

## 2020-12-26 VITALS — BP 124/73 | HR 88 | Temp 97.0°F | Resp 20 | Ht 62.5 in | Wt 159.3 lb

## 2020-12-26 DIAGNOSIS — C3492 Malignant neoplasm of unspecified part of left bronchus or lung: Secondary | ICD-10-CM

## 2020-12-26 DIAGNOSIS — C3432 Malignant neoplasm of lower lobe, left bronchus or lung: Secondary | ICD-10-CM | POA: Diagnosis not present

## 2020-12-26 DIAGNOSIS — C3490 Malignant neoplasm of unspecified part of unspecified bronchus or lung: Secondary | ICD-10-CM | POA: Diagnosis not present

## 2020-12-26 NOTE — Progress Notes (Signed)
[place] Telephone:([phone]   Fax:([phone]  OFFICE PROGRESS NOTE  [name], MD [place] [address]  DIAGNOSIS: Stage IA (T1c, N0, M0) non-small cell lung cancer, adenocarcinoma diagnosed in May 2021.  PRIOR THERAPY: Status post left lower lobectomy with lymph node dissection under the care of Dr. Roxan Hockey on 11/23/2019.  CURRENT THERAPY: Observation.  INTERVAL HISTORY: Anne Hogan 75 y.o. female returns to the clinic today for 52-month follow-up visit.  The patient is feeling fine today with no concerning complaints.  She denied having any chest pain, shortness of breath, cough or hemoptysis.  She denied having any nausea, vomiting, diarrhea or constipation.  She has no headache or visual changes.  She is currently on observation.  She had repeat CT scan of the chest performed recently and she is here for evaluation and discussion of her risk her results.  MEDICAL HISTORY: Past Medical History:  Diagnosis Date   ADD (attention deficit disorder)    Arthritis    "right knee" (04/19/2013)   Asthma 04/03/2019   CHF (congestive heart failure) ([facility])    Chronic diastolic congestive heart failure ([place])    Claustrophobia    Depression    Dyspnea    Dysrhythmia    Fracture of right tibial plateau 05/2016   GERD (gastroesophageal reflux disease)    Heart murmur    Incidental pulmonary nodule, greater than or equal to 69mm 06/06/2019   Needs f/u CT in 3 months   Migraine    "bad when I was in 5th or 6th grade; I can usually ward them off by resting my head now" (04/19/2013)   MVP (mitral valve prolapse)    S/P minimally-invasive mitral valve repair 08/03/2019   Complex valvuloplasty including artificial Gore-tex neochords x12 and Sorin Memo 4D ring annuloplasty, size 38 mm   S/P patent foramen ovale closure 08/03/2019   Sleep apnea    has not used mouth guard in 5 years no cpap used    Squamous carcinoma ?1999   "upper left  groin" (04/19/2013)    ALLERGIES:  is allergic to penicillins, codeine, milk-related compounds, and morphine and related.  MEDICATIONS:  Current Outpatient Medications  Medication Sig Dispense Refill   Vitamin D, Ergocalciferol, (DRISDOL) 1.25 MG (50000 UT) CAPS capsule Take 50,000 Units by mouth 2 (two) times a week. Sundays & Thursdays.     acetaminophen (TYLENOL) 500 MG tablet Take 500-1,000 mg by mouth every 6 (six) hours as needed for moderate pain (pain).      Alum Hydroxide-Mag Carbonate (GAVISCON EXTRA STRENGTH) 160-105 MG CHEW Chew 1 tablet by mouth 2 (two) times daily as needed (indigestion).      aspirin EC 81 MG EC tablet Take 1 tablet (81 mg total) by mouth daily.     Biotin 5000 MCG TABS Take 5,000 mcg by mouth daily.     BREO ELLIPTA 100-25 MCG/INH AEPB USE 1 PUFF DAILY AS DIRECTED 60 each 0   buPROPion (WELLBUTRIN XL) 150 MG 24 hr tablet Take 1 tablet by mouth daily.     Calcium-Magnesium-Zinc 333-133-5 MG TABS Take 1-3 tablets by mouth daily at 12 noon.     doxycycline (VIBRAMYCIN) 100 MG capsule Take 1 capsule (100 mg total) one hour prior to any dental procedure 2 capsule 11   montelukast (SINGULAIR) 10 MG tablet Take 10 mg by mouth daily as needed (respiratory issues.).     Multiple Vitamins-Minerals (CENTRUM SILVER 50+WOMEN PO) Take 1  tablet by mouth daily.     No current facility-administered medications for this visit.    SURGICAL HISTORY:  Past Surgical History:  Procedure Laterality Date   CARDIAC CATHETERIZATION  05/26/2019   CHOLECYSTECTOMY  04/18/2013   CHOLECYSTECTOMY N/A 04/18/2013   Procedure: LAPAROSCOPIC CHOLECYSTECTOMY WITH INTRAOPERATIVE CHOLANGIOGRAM;  Surgeon: Ralene Ok, MD;  Location: [facility];  Service: General;  Laterality: N/A;   COLONOSCOPY W/ POLYPECTOMY     EYE SURGERY Bilateral FALL  2016   IOC FOR CATARACTS   FRACTURE SURGERY     INTERCOSTAL NERVE BLOCK Left 11/23/2019   Procedure: Intercostal Nerve Block;  Surgeon: Melrose Nakayama,  MD;  Location: [facility];  Service: Thoracic;  Laterality: Left;   JOINT REPLACEMENT     KNEE ARTHROSCOPY Right 12/2007   "meniscus repair" (04/19/2013)   LYMPH NODE DISSECTION Left 11/23/2019   Procedure: Lymph Node Dissection;  Surgeon: Melrose Nakayama, MD;  Location: [facility];  Service: Thoracic;  Laterality: Left;   MITRAL VALVE REPAIR Right 08/03/2019   Procedure: MINIMALLY INVASIVE MITRAL VALVE REPAIR (MVR) using Memo 4D 96 MM Mitral Valve.;  Surgeon: Rexene Alberts, MD;  Location: [facility];  Service: Open Heart Surgery;  Laterality: Right;   REPAIR OF PATENT FORAMEN OVALE N/A 08/03/2019   Procedure: CLOSURE OF PATENT FORAMEN OVALE;  Surgeon: Rexene Alberts, MD;  Location: [facility];  Service: Open Heart Surgery;  Laterality: N/A;   RETINAL TEAR REPAIR CRYOTHERAPY Left ~ 1997   RIGHT/LEFT HEART CATH AND CORONARY ANGIOGRAPHY N/A 05/26/2019   Procedure: RIGHT/LEFT HEART CATH AND CORONARY ANGIOGRAPHY;  Surgeon: Jettie Booze, MD;  Location: [facility];  Service: Cardiovascular;  Laterality: N/A;   TEE WITHOUT CARDIOVERSION N/A 05/26/2019   Procedure: TRANSESOPHAGEAL ECHOCARDIOGRAM (TEE);  Surgeon: Josue Hector, MD;  [facility];  Service: Cardiovascular;  Laterality: N/A;   TEE WITHOUT CARDIOVERSION N/A 08/03/2019   Procedure: TRANSESOPHAGEAL ECHOCARDIOGRAM (TEE);  Surgeon: Rexene Alberts, MD;  Location: [facility];  Service: Open Heart Surgery;  Laterality: N/A;   TONSILLECTOMY  ~ [place] Right 09/21/2016   Procedure: RIGHT TOTAL KNEE ARTHROPLASTY;  Surgeon: Gaynelle Arabian, MD;  Location: [facility];  Service: Orthopedics;  Laterality: Right;  requests 23mins with abductor block   VARICOSE VEIN SURGERY Right 2000's   VASCULAR SURGERY     WRIST SURGERY Right 1975   "had a knot taken off" (04/19/2013)    REVIEW OF SYSTEMS:  A comprehensive review of systems was negative.   PHYSICAL EXAMINATION: General appearance: alert, cooperative, and no distress Head:  Normocephalic, without obvious abnormality, atraumatic Neck: no adenopathy, no JVD, supple, symmetrical, trachea midline, and thyroid not enlarged, symmetric, no tenderness/mass/nodules Lymph nodes: Cervical, supraclavicular, and axillary nodes normal. Resp: clear to auscultation bilaterally Back: symmetric, no curvature. ROM normal. No CVA tenderness. Cardio: regular rate and rhythm, S1, S2 normal, no murmur, click, rub or gallop GI: soft, non-tender; bowel sounds normal; no masses,  no organomegaly Extremities: extremities normal, atraumatic, no cyanosis or edema  ECOG PERFORMANCE STATUS: 1 - Symptomatic but completely ambulatory  Blood pressure 124/73, pulse 88, temperature (!) 97 F (36.1 C), temperature source Tympanic, resp. rate 20, height 5' 2.5" (1.588 m), weight 159 lb 4.8 oz (72.3 kg), SpO2 99 %.  LABORATORY DATA: Lab Results  Component Value Date   WBC 5.9 12/24/2020   HGB 14.4 12/24/2020   HCT 42.9 12/24/2020   MCV 89.7 12/24/2020   PLT 347 12/24/2020  Chemistry      Component Value Date/Time   NA 140 12/24/2020 1118   NA 140 05/12/2019 1243   K 4.6 12/24/2020 1118   CL 106 12/24/2020 1118   CO2 25 12/24/2020 1118   BUN 15 12/24/2020 1118   BUN 14 05/12/2019 1243   CREATININE 1.09 (H) 12/24/2020 1118   CREATININE 1.05 (H) 07/19/2019 1206      Component Value Date/Time   CALCIUM 9.4 12/24/2020 1118   ALKPHOS 70 12/24/2020 1118   AST 17 12/24/2020 1118   ALT 16 12/24/2020 1118   BILITOT 0.9 12/24/2020 1118       RADIOGRAPHIC STUDIES: CT Chest W Contrast  Result Date: 12/25/2020 CLINICAL DATA:  Non-small-cell lung cancer. EXAM: CT CHEST WITH CONTRAST TECHNIQUE: Multidetector CT imaging of the chest was performed during intravenous contrast administration. CONTRAST:  11mL OMNIPAQUE IOHEXOL 300 MG/ML  SOLN COMPARISON:  06/25/2021 FINDINGS: Cardiovascular: The heart size is normal. No substantial pericardial effusion. Status post mitral valve replacement.  Atherosclerotic calcification is noted in the wall of the thoracic aorta. Enlargement of the pulmonary outflow tract and main pulmonary arteries suggests pulmonary arterial hypertension. Mediastinum/Nodes: No mediastinal lymphadenopathy. There is no hilar lymphadenopathy. The esophagus has normal imaging features. There is no axillary lymphadenopathy. Lungs/Pleura: Centrilobular emphsyema noted. Bilateral ground-glass nodules are stable, including index 10 mm left upper lobe lesion on 27/5. Calcified scarring in the inferior right lower lobe at the dome of the hemidiaphragm is stable. Volume loss left hemithorax compatible with prior left lower lobectomy. No focal airspace consolidation. No pleural effusion. Upper Abdomen: Stable hypoattenuating liver lesions, likely cysts. Right renal cyst is similar to prior. Musculoskeletal: No worrisome lytic or sclerotic osseous abnormality. IMPRESSION: 1. Stable exam. No new or progressive findings to suggest recurrent or metastatic disease. Bilateral ground-glass nodules are stable. Continued attention on follow-up recommended. 2. Status post left lower lobectomy. 3. Enlargement of the pulmonary outflow tract and main pulmonary arteries suggests pulmonary arterial hypertension. 4. Aortic Atherosclerosis (ICD10-I70.0) and Emphysema (ICD10-J43.9). Electronically Signed   By: Misty Stanley M.D.   On: 12/25/2020 08:53   LONG TERM MONITOR (3-14 DAYS)  Result Date: 12/01/2020 Patch Wear Time:  13 days and 17 hours Max 200 bpm 05:01pm, 04/28 Min 57 bpm 06:14am, 04/23 Avg 76 bpm Less than 1% ventricular and supraventricular ectopy Predominant underlying rhythm was sinus rhythm 1 run of SVT lasting 5 beats at 200 bpm 2 runs of VT, longest 11 beats at 164 bpm Triggered episodes associated with sinus rhythm Will Camnitz, MD    ASSESSMENT AND PLAN: This is a very pleasant 75 years old white female diagnosed with a stage IA (T1c, N0, M0) non-small cell lung cancer, adenocarcinoma  status post left lower lobectomy with lymph node dissection in May 2021 under the care of Dr. Roxan Hockey. The patient is currently on observation and she is feeling fine with no concerning complaints. She had repeat CT scan of the chest performed recently.  I personally and independently reviewed the scans and discussed the results with the patient today. Her scan showed no concerning findings for disease recurrence or metastasis but she continues to have the stable bilateral groundglass nodules. I recommended for her to continue on observation with repeat CT scan of the chest in 6 months. The patient was advised to call immediately if she has any concerning symptoms in the interval. The patient voices understanding of current disease status and treatment options and is in agreement with the current care plan.  All  questions were answered. The patient knows to call the clinic with any problems, questions or concerns. We can certainly see the patient much sooner if necessary.  Disclaimer: This note was dictated with voice recognition software. Similar sounding words can inadvertently be transcribed and may not be corrected upon review.

## 2020-12-31 ENCOUNTER — Telehealth: Payer: Self-pay | Admitting: Internal Medicine

## 2020-12-31 NOTE — Telephone Encounter (Signed)
Scheduled per los. Called and left msg. Mailed printout  °

## 2021-02-17 DIAGNOSIS — B351 Tinea unguium: Secondary | ICD-10-CM | POA: Diagnosis not present

## 2021-02-17 DIAGNOSIS — M792 Neuralgia and neuritis, unspecified: Secondary | ICD-10-CM | POA: Diagnosis not present

## 2021-02-17 DIAGNOSIS — M21611 Bunion of right foot: Secondary | ICD-10-CM | POA: Diagnosis not present

## 2021-02-17 DIAGNOSIS — M21612 Bunion of left foot: Secondary | ICD-10-CM | POA: Diagnosis not present

## 2021-02-21 ENCOUNTER — Telehealth: Payer: Self-pay | Admitting: Cardiology

## 2021-02-21 NOTE — Telephone Encounter (Signed)
STAT if HR is under 50 or over 120 (normal HR is 60-100 beats per minute)  What is your heart rate? 91 while sitting   Do you have a log of your heart rate readings (document readings)? 40-119  Do you have any other symptoms? No   Tachycardia occurred Saturday called insurance nurse with [employer] advised her to call Monday just now calling. No other symptoms HR got up to 119 and down to 40 in a span of 5 mins. Wanted to report to the office. HR normal since surgery until then.

## 2021-03-03 NOTE — Telephone Encounter (Signed)
Returned call to pt to follow up.  Apologized for someone not getting back with her on this matter. She stated she was just letting us know. Pt scheduled to see Camnitz next week and will further discuss if continues. She appreciates the follow up

## 2021-03-13 ENCOUNTER — Ambulatory Visit: Payer: PPO | Admitting: Cardiology

## 2021-03-13 ENCOUNTER — Other Ambulatory Visit: Payer: Self-pay

## 2021-03-13 ENCOUNTER — Encounter: Payer: Self-pay | Admitting: Cardiology

## 2021-03-13 VITALS — BP 124/70 | HR 84 | Ht 62.5 in | Wt 158.6 lb

## 2021-03-13 DIAGNOSIS — Z9889 Other specified postprocedural states: Secondary | ICD-10-CM | POA: Diagnosis not present

## 2021-03-13 DIAGNOSIS — I340 Nonrheumatic mitral (valve) insufficiency: Secondary | ICD-10-CM

## 2021-03-13 DIAGNOSIS — D2372 Other benign neoplasm of skin of left lower limb, including hip: Secondary | ICD-10-CM | POA: Diagnosis not present

## 2021-03-13 DIAGNOSIS — M792 Neuralgia and neuritis, unspecified: Secondary | ICD-10-CM | POA: Diagnosis not present

## 2021-03-13 DIAGNOSIS — M21612 Bunion of left foot: Secondary | ICD-10-CM | POA: Diagnosis not present

## 2021-03-13 DIAGNOSIS — M21611 Bunion of right foot: Secondary | ICD-10-CM | POA: Diagnosis not present

## 2021-03-13 MED ORDER — DOXYCYCLINE HYCLATE 100 MG PO CAPS: ORAL_CAPSULE | ORAL | 3 refills | Status: DC

## 2021-03-13 NOTE — Patient Instructions (Signed)
Medication Instructions:  NO CHANGES *If you need a refill on your cardiac medications before your next appointment, please call your pharmacy*   Lab Work: NONE If you have labs (blood work) drawn today and your tests are completely normal, you will receive your results only by: [facility] (if you have MyChart) OR A paper copy in the mail If you have any lab test that is abnormal or we need to change your treatment, we will call you to review the results.   Testing/Procedures: NONE   Follow-Up: At [facility], you and your health needs are our priority.  As part of our continuing mission to provide you with exceptional heart care, we have created designated Provider Care Teams.  These Care Teams include your primary Cardiologist (physician) and Advanced Practice Providers (APPs -  Physician Assistants and Nurse Practitioners) who all work together to provide you with the care you need, when you need it.  We recommend signing up for the patient portal called "MyChart".  Sign up information is provided on this After Visit Summary.  MyChart is used to connect with patients for Virtual Visits (Telemedicine).  Patients are able to view lab/test results, encounter notes, upcoming appointments, etc.  Non-urgent messages can be sent to your provider as well.   To learn more about what you can do with MyChart, go to [email or web address].    Your next appointment:   1 year(s)  The format for your next appointment:   In Person  Provider:   WITH DR Curt Bears  Other Instructions

## 2021-03-13 NOTE — Progress Notes (Signed)
Cardiology Office Note   Date:  03/13/2021   ID:  [name], DOB 1946-05-24, MRN [id number]  PCP:  [name], MD  Cardiologist:  Constance Haw, MD    No chief complaint on file.    History of Present Illness: Anne Hogan is a 75 y.o. female who presents today for cardiology evaluation.     She has a history significant for mitral regurgitation palpitation possibly due to PVCs.  Her mitral regurgitation is severe.  She is status post mini mitral valve repair and PFO closure.  During that work-up she was found to have a lung mass was diagnosed with adenocarcinoma of the left lower lobe and is status postresection.   Today, denies symptoms of palpitations, chest pain, shortness of breath, orthopnea, PND, lower extremity edema, claudication, dizziness, presyncope, syncope, bleeding, or neurologic sequela. The patient is tolerating medications without difficulties.  Since being seen she has done well.  Today with all her daily activities without restriction.  She continues to exercise.  She has no major cardiac complaints.   Past Medical History:  Diagnosis Date   ADD (attention deficit disorder)    Arthritis    "right knee" (04/19/2013)   Asthma 04/03/2019   CHF (congestive heart failure) (HCC)    Chronic diastolic congestive heart failure ([place])    Claustrophobia    Depression    Dyspnea    Dysrhythmia    Fracture of right tibial plateau 05/2016   GERD (gastroesophageal reflux disease)    Heart murmur    Incidental pulmonary nodule, greater than or equal to 72mm 06/06/2019   Needs f/u CT in 3 months   Migraine    "bad when I was in 5th or 6th grade; I can usually ward them off by resting my head now" (04/19/2013)   MVP (mitral valve prolapse)    S/P minimally-invasive mitral valve repair 08/03/2019   Complex valvuloplasty including artificial Gore-tex neochords x12 and Sorin Memo 4D ring annuloplasty, size 38 mm   S/P patent foramen ovale closure 08/03/2019    Sleep apnea    has not used mouth guard in 5 years no cpap used    Squamous carcinoma ?1999   "upper left groin" (04/19/2013)   Past Surgical History:  Procedure Laterality Date   CARDIAC CATHETERIZATION  05/26/2019   CHOLECYSTECTOMY  04/18/2013   CHOLECYSTECTOMY N/A 04/18/2013   Procedure: LAPAROSCOPIC CHOLECYSTECTOMY WITH INTRAOPERATIVE CHOLANGIOGRAM;  Surgeon: Ralene Ok, MD;  Location: [facility];  Service: General;  Laterality: N/A;   COLONOSCOPY W/ POLYPECTOMY     EYE SURGERY Bilateral FALL  2016   IOC FOR CATARACTS   FRACTURE SURGERY     INTERCOSTAL NERVE BLOCK Left 11/23/2019   Procedure: Intercostal Nerve Block;  Surgeon: Melrose Nakayama, MD;  Location: [facility];  Service: Thoracic;  Laterality: Left;   JOINT REPLACEMENT     KNEE ARTHROSCOPY Right 12/2007   "meniscus repair" (04/19/2013)   LYMPH NODE DISSECTION Left 11/23/2019   Procedure: Lymph Node Dissection;  Surgeon: Melrose Nakayama, MD;  Location: [facility];  Service: Thoracic;  Laterality: Left;   MITRAL VALVE REPAIR Right 08/03/2019   Procedure: MINIMALLY INVASIVE MITRAL VALVE REPAIR (MVR) using Memo 4D 26 MM Mitral Valve.;  Surgeon: Rexene Alberts, MD;  Location: [facility];  Service: Open Heart Surgery;  Laterality: Right;   REPAIR OF PATENT FORAMEN OVALE N/A 08/03/2019   Procedure: CLOSURE OF PATENT FORAMEN OVALE;  Surgeon: Rexene Alberts, MD;  Location: [facility];  Service: Open Heart Surgery;  Laterality: N/A;   RETINAL TEAR REPAIR CRYOTHERAPY Left ~ 1997   RIGHT/LEFT HEART CATH AND CORONARY ANGIOGRAPHY N/A 05/26/2019   Procedure: RIGHT/LEFT HEART CATH AND CORONARY ANGIOGRAPHY;  Surgeon: Jettie Booze, MD;  Location: [facility];  Service: Cardiovascular;  Laterality: N/A;   TEE WITHOUT CARDIOVERSION N/A 05/26/2019   Procedure: TRANSESOPHAGEAL ECHOCARDIOGRAM (TEE);  Surgeon: Josue Hector, MD;  Location: [facility];  Service: Cardiovascular;  Laterality: N/A;   TEE WITHOUT CARDIOVERSION N/A 08/03/2019    Procedure: TRANSESOPHAGEAL ECHOCARDIOGRAM (TEE);  Surgeon: Rexene Alberts, MD;  Location: [facility];  Service: Open Heart Surgery;  Laterality: N/A;   TONSILLECTOMY  ~ [place] Right 09/21/2016   Procedure: RIGHT TOTAL KNEE ARTHROPLASTY;  Surgeon: Gaynelle Arabian, MD;  Location: [facility];  Service: Orthopedics;  Laterality: Right;  requests 8mins with abductor block   VARICOSE VEIN SURGERY Right 2000's   VASCULAR SURGERY     WRIST SURGERY Right 1975   "had a knot taken off" (04/19/2013)     Current Outpatient Medications  Medication Sig Dispense Refill   acetaminophen (TYLENOL) 500 MG tablet Take 500-1,000 mg by mouth every 6 (six) hours as needed for moderate pain (pain).      Alum Hydroxide-Mag Carbonate (GAVISCON EXTRA STRENGTH) 160-105 MG CHEW Chew 1 tablet by mouth 2 (two) times daily as needed (indigestion).      aspirin EC 81 MG EC tablet Take 1 tablet (81 mg total) by mouth daily.     Biotin 5000 MCG TABS Take 5,000 mcg by mouth daily.     BREO ELLIPTA 100-25 MCG/INH AEPB USE 1 PUFF DAILY AS DIRECTED 60 each 0   buPROPion (WELLBUTRIN XL) 150 MG 24 hr tablet Take 1 tablet by mouth daily.     Calcium-Magnesium-Zinc 333-133-5 MG TABS Take 1-3 tablets by mouth daily at 12 noon.     doxycycline (VIBRAMYCIN) 100 MG capsule Take 1 capsule (100 mg total) one hour prior to any dental procedure 2 capsule 11   montelukast (SINGULAIR) 10 MG tablet Take 10 mg by mouth daily as needed (respiratory issues.).     Multiple Vitamins-Minerals (CENTRUM SILVER 50+WOMEN PO) Take 1 tablet by mouth daily.     Vitamin D, Ergocalciferol, (DRISDOL) 1.25 MG (50000 UT) CAPS capsule Take 50,000 Units by mouth 2 (two) times a week. Sundays & Thursdays.     No current facility-administered medications for this visit.    Allergies:   Penicillins, Codeine, Milk-related compounds, and Morphine and related   Social History:  The patient  reports that she quit smoking about 34 years ago. Her smoking  use included cigarettes. She has a 30.00 pack-year smoking history. She has never used smokeless tobacco. She reports current alcohol use. She reports that she does not use drugs.   Family History:  The patient's family history includes Congestive Heart Failure in her father; Diabetes Mellitus II in her brother and father; Heart attack in her father; Hypertension in her mother; Osteoporosis in her mother; Sarcoidosis in her mother; Tremor in her mother.   ROS:  Please see the history of present illness.   Otherwise, review of systems is positive for none.   All other systems are reviewed and negative.   PHYSICAL EXAM: VS:  BP 124/70   Pulse 84   Ht 5' 2.5" (1.588 m)   Wt 158 lb 9.6 oz (71.9 kg)   SpO2 97%   BMI 28.55 kg/m  ,  BMI Body mass index is 28.55 kg/m. GEN: Well nourished, well developed, in no acute distress  HEENT: normal  Neck: no JVD, carotid bruits, or masses Cardiac: RRR; no murmurs, rubs, or gallops,no edema  Respiratory:  clear to auscultation bilaterally, normal work of breathing GI: soft, nontender, nondistended, + BS MS: no deformity or atrophy  Skin: warm and dry Neuro:  Strength and sensation are intact Psych: euthymic mood, full affect  EKG:  EKG is ordered today. Personal review of the ekg ordered shows sinus rhythm, rate 84  Recent Labs: 12/24/2020: ALT 16; BUN 15; Creatinine 1.09; Hemoglobin 14.4; Platelet Count 347; Potassium 4.6; Sodium 140    Lipid Panel  No results found for: CHOL, TRIG, HDL, CHOLHDL, VLDL, LDLCALC, LDLDIRECT   Wt Readings from Last 3 Encounters:  12/26/20 159 lb 4.8 oz (72.3 kg)  08/20/20 144 lb (65.3 kg)  08/12/20 155 lb (70.3 kg)      Other studies Reviewed: Additional studies/ records that were reviewed today include: TTE 09/13/2019  1. Left ventricular ejection fraction, by estimation, is 60 to 65%. The  left ventricle has normal function. The left ventricle has no regional  wall motion abnormalities. Left ventricular  diastolic parameters are  consistent with Grade I diastolic  dysfunction (impaired relaxation).   2. Right ventricular systolic function is normal. The right ventricular  size is normal. There is mildly elevated pulmonary artery systolic  pressure.   3. Left atrial size was mild to moderately dilated.   4. The mitral valve has been repaired/replaced. No evidence of mitral  valve regurgitation. No evidence of mitral stenosis. There is a 38 mm  prosthetic annuloplasty ring present in the mitral position. Procedure  Date: 08/03/2019. Echo findings are  consistent with normal structure and function of the mitral valve  prosthesis.   5. The aortic valve is tricuspid. Aortic valve regurgitation is mild. No  aortic stenosis is present.   6. The inferior vena cava is normal in size with greater than 50%  respiratory variability, suggesting right atrial pressure of 3 mmHg.   Cardiac monitor 02/16/2020 Max 169 bpm 11:01pm, 07/14 Min 67 bpm 12:15am, 07/16 Avg 82 bpm <1% PACs and PVCs One run of VT, 8 beats at 169 BMP One SVT run 4 beats at 125 BPM No reported symptoms    ASSESSMENT AND PLAN:  1.  Severe mitral rotation: Status post minimally invasive mitral valve repair and PFO closure.  Repeat echo shows a stable valve.  No changes.  2.  Palpitations: Continues to have minor palpitations.  Cardiac monitor without evidence of arrhythmia.  Continue to monitor.  3.  Non-small cell lung cancer: Found to be adenocarcinoma of the left lower lobe stage T1N0.  Status post lobectomy.  No current changes.   Current medicines are reviewed at length with the patient today.   The patient does not have concerns regarding her medicines.  The following changes were made today: None  Labs/ tests ordered today include:  No orders of the defined types were placed in this encounter.    Disposition:   FU with Chevonne Bostrom 12 months  Signed, Yarnell Arvidson Meredith Leeds, MD  03/13/2021 9:09 AM     [facility]  [facility] [address] [place] [address] [phone] (office) [phone] (fax)

## 2021-05-01 DIAGNOSIS — G43909 Migraine, unspecified, not intractable, without status migrainosus: Secondary | ICD-10-CM | POA: Diagnosis not present

## 2021-05-01 DIAGNOSIS — H524 Presbyopia: Secondary | ICD-10-CM | POA: Diagnosis not present

## 2021-05-01 DIAGNOSIS — Z961 Presence of intraocular lens: Secondary | ICD-10-CM | POA: Diagnosis not present

## 2021-05-07 DIAGNOSIS — H6121 Impacted cerumen, right ear: Secondary | ICD-10-CM | POA: Diagnosis not present

## 2021-05-27 DIAGNOSIS — M859 Disorder of bone density and structure, unspecified: Secondary | ICD-10-CM | POA: Diagnosis not present

## 2021-05-27 DIAGNOSIS — E785 Hyperlipidemia, unspecified: Secondary | ICD-10-CM | POA: Diagnosis not present

## 2021-05-27 DIAGNOSIS — R7301 Impaired fasting glucose: Secondary | ICD-10-CM | POA: Diagnosis not present

## 2021-05-28 DIAGNOSIS — L821 Other seborrheic keratosis: Secondary | ICD-10-CM | POA: Diagnosis not present

## 2021-05-28 DIAGNOSIS — L304 Erythema intertrigo: Secondary | ICD-10-CM | POA: Diagnosis not present

## 2021-05-28 DIAGNOSIS — D1801 Hemangioma of skin and subcutaneous tissue: Secondary | ICD-10-CM | POA: Diagnosis not present

## 2021-05-28 DIAGNOSIS — L509 Urticaria, unspecified: Secondary | ICD-10-CM | POA: Diagnosis not present

## 2021-05-28 DIAGNOSIS — Z79899 Other long term (current) drug therapy: Secondary | ICD-10-CM | POA: Diagnosis not present

## 2021-05-28 DIAGNOSIS — L718 Other rosacea: Secondary | ICD-10-CM | POA: Diagnosis not present

## 2021-05-28 DIAGNOSIS — I8391 Asymptomatic varicose veins of right lower extremity: Secondary | ICD-10-CM | POA: Diagnosis not present

## 2021-05-28 DIAGNOSIS — D1722 Benign lipomatous neoplasm of skin and subcutaneous tissue of left arm: Secondary | ICD-10-CM | POA: Diagnosis not present

## 2021-05-28 DIAGNOSIS — L814 Other melanin hyperpigmentation: Secondary | ICD-10-CM | POA: Diagnosis not present

## 2021-05-30 DIAGNOSIS — Z23 Encounter for immunization: Secondary | ICD-10-CM | POA: Diagnosis not present

## 2021-05-30 DIAGNOSIS — M179 Osteoarthritis of knee, unspecified: Secondary | ICD-10-CM | POA: Diagnosis not present

## 2021-05-30 DIAGNOSIS — R7301 Impaired fasting glucose: Secondary | ICD-10-CM | POA: Diagnosis not present

## 2021-05-30 DIAGNOSIS — N1831 Chronic kidney disease, stage 3a: Secondary | ICD-10-CM | POA: Diagnosis not present

## 2021-05-30 DIAGNOSIS — R911 Solitary pulmonary nodule: Secondary | ICD-10-CM | POA: Diagnosis not present

## 2021-05-30 DIAGNOSIS — I340 Nonrheumatic mitral (valve) insufficiency: Secondary | ICD-10-CM | POA: Diagnosis not present

## 2021-05-30 DIAGNOSIS — E785 Hyperlipidemia, unspecified: Secondary | ICD-10-CM | POA: Diagnosis not present

## 2021-05-30 DIAGNOSIS — J45909 Unspecified asthma, uncomplicated: Secondary | ICD-10-CM | POA: Diagnosis not present

## 2021-05-30 DIAGNOSIS — M8580 Other specified disorders of bone density and structure, unspecified site: Secondary | ICD-10-CM | POA: Diagnosis not present

## 2021-05-30 DIAGNOSIS — K219 Gastro-esophageal reflux disease without esophagitis: Secondary | ICD-10-CM | POA: Diagnosis not present

## 2021-05-30 DIAGNOSIS — Z0000 Encounter for general adult medical examination without abnormal findings: Secondary | ICD-10-CM | POA: Diagnosis not present

## 2021-05-30 DIAGNOSIS — F39 Unspecified mood [affective] disorder: Secondary | ICD-10-CM | POA: Diagnosis not present

## 2021-05-30 DIAGNOSIS — G4733 Obstructive sleep apnea (adult) (pediatric): Secondary | ICD-10-CM | POA: Diagnosis not present

## 2021-06-04 DIAGNOSIS — Z902 Acquired absence of lung [part of]: Secondary | ICD-10-CM | POA: Diagnosis not present

## 2021-06-04 DIAGNOSIS — Z08 Encounter for follow-up examination after completed treatment for malignant neoplasm: Secondary | ICD-10-CM | POA: Diagnosis not present

## 2021-06-04 DIAGNOSIS — Z85118 Personal history of other malignant neoplasm of bronchus and lung: Secondary | ICD-10-CM | POA: Diagnosis not present

## 2021-06-27 ENCOUNTER — Other Ambulatory Visit: Payer: Self-pay

## 2021-06-27 ENCOUNTER — Other Ambulatory Visit: Payer: PPO

## 2021-06-27 ENCOUNTER — Inpatient Hospital Stay: Payer: PPO | Attending: Internal Medicine

## 2021-06-27 ENCOUNTER — Ambulatory Visit (HOSPITAL_COMMUNITY)
Admission: RE | Admit: 2021-06-27 | Discharge: 2021-06-27 | Disposition: A | Discharge status: Home / Self Care | Payer: PPO | Source: Ambulatory Visit | Attending: Internal Medicine | Admitting: Internal Medicine

## 2021-06-27 DIAGNOSIS — R911 Solitary pulmonary nodule: Secondary | ICD-10-CM | POA: Diagnosis not present

## 2021-06-27 DIAGNOSIS — J439 Emphysema, unspecified: Secondary | ICD-10-CM | POA: Diagnosis not present

## 2021-06-27 DIAGNOSIS — C3490 Malignant neoplasm of unspecified part of unspecified bronchus or lung: Secondary | ICD-10-CM | POA: Diagnosis not present

## 2021-06-27 DIAGNOSIS — I700 Atherosclerosis of aorta: Secondary | ICD-10-CM | POA: Diagnosis not present

## 2021-06-27 DIAGNOSIS — Z85118 Personal history of other malignant neoplasm of bronchus and lung: Secondary | ICD-10-CM | POA: Insufficient documentation

## 2021-06-27 DIAGNOSIS — R918 Other nonspecific abnormal finding of lung field: Secondary | ICD-10-CM | POA: Diagnosis not present

## 2021-06-27 DIAGNOSIS — Z79899 Other long term (current) drug therapy: Secondary | ICD-10-CM | POA: Insufficient documentation

## 2021-06-27 LAB — CBC WITH DIFFERENTIAL (CANCER CENTER ONLY)
Abs Immature Granulocytes: 0.02 10*3/uL (ref 0.00–0.07)
Basophils Absolute: 0.1 10*3/uL (ref 0.0–0.1)
Basophils Relative: 1 %
Eosinophils Absolute: 0.2 10*3/uL (ref 0.0–0.5)
Eosinophils Relative: 3 %
HCT: 42.8 % (ref 36.0–46.0)
Hemoglobin: 14.1 g/dL (ref 12.0–15.0)
Immature Granulocytes: 0 %
Lymphocytes Relative: 23 %
Lymphs Abs: 1.6 10*3/uL (ref 0.7–4.0)
MCH: 29.8 pg (ref 26.0–34.0)
MCHC: 32.9 g/dL (ref 30.0–36.0)
MCV: 90.5 fL (ref 80.0–100.0)
Monocytes Absolute: 0.7 10*3/uL (ref 0.1–1.0)
Monocytes Relative: 10 %
Neutro Abs: 4.5 10*3/uL (ref 1.7–7.7)
Neutrophils Relative %: 63 %
Platelet Count: 364 10*3/uL (ref 150–400)
RBC: 4.73 MIL/uL (ref 3.87–5.11)
RDW: 12.2 % (ref 11.5–15.5)
WBC Count: 7.1 10*3/uL (ref 4.0–10.5)
nRBC: 0 % (ref 0.0–0.2)

## 2021-06-27 LAB — CMP (CANCER CENTER ONLY)
ALT: 21 U/L (ref 0–44)
AST: 20 U/L (ref 15–41)
Albumin: 4 g/dL (ref 3.5–5.0)
Alkaline Phosphatase: 65 U/L (ref 38–126)
Anion gap: 8 (ref 5–15)
BUN: 18 mg/dL (ref 8–23)
CO2: 26 mmol/L (ref 22–32)
Calcium: 9.4 mg/dL (ref 8.9–10.3)
Chloride: 105 mmol/L (ref 98–111)
Creatinine: 1.15 mg/dL — ABNORMAL HIGH (ref 0.44–1.00)
GFR, Estimated: 50 mL/min — ABNORMAL LOW (ref >60)
Glucose, Bld: 91 mg/dL (ref 70–99)
Potassium: 4.6 mmol/L (ref 3.5–5.1)
Sodium: 139 mmol/L (ref 135–145)
Total Bilirubin: 0.8 mg/dL (ref 0.3–1.2)
Total Protein: 7.1 g/dL (ref 6.5–8.1)

## 2021-06-27 MED ORDER — IOHEXOL 350 MG/ML SOLN
60.0000 mL | Freq: Once | INTRAVENOUS | Status: AC | PRN
  Administered 2021-06-27: 60 mL via INTRAVENOUS

## 2021-06-27 MED ORDER — SODIUM CHLORIDE (PF) 0.9 % IJ SOLN
INTRAMUSCULAR | Status: AC
  Filled 2021-06-27: qty 50

## 2021-06-30 ENCOUNTER — Other Ambulatory Visit: Payer: Self-pay

## 2021-06-30 ENCOUNTER — Inpatient Hospital Stay: Payer: PPO | Admitting: Internal Medicine

## 2021-06-30 VITALS — BP 124/74 | HR 89 | Temp 97.6°F | Resp 20 | Ht 62.5 in | Wt 163.7 lb

## 2021-06-30 DIAGNOSIS — C3490 Malignant neoplasm of unspecified part of unspecified bronchus or lung: Secondary | ICD-10-CM

## 2021-06-30 DIAGNOSIS — Z79899 Other long term (current) drug therapy: Secondary | ICD-10-CM | POA: Diagnosis not present

## 2021-06-30 DIAGNOSIS — Z85118 Personal history of other malignant neoplasm of bronchus and lung: Secondary | ICD-10-CM | POA: Diagnosis not present

## 2021-06-30 NOTE — Progress Notes (Signed)
[place] [phone]   Fax:([phone]  OFFICE PROGRESS NOTE  [name], MD [place] [address]  DIAGNOSIS: Stage IA (T1c, N0, M0) non-small cell lung cancer, adenocarcinoma diagnosed in May 2021.  PRIOR THERAPY: Status post left lower lobectomy with lymph node dissection under the care of Dr. Roxan Hockey on 11/23/2019.  CURRENT THERAPY: Observation.  INTERVAL HISTORY: Anne Hogan 75 y.o. female returns to the clinic today for 35-month follow-up visit.  The patient is feeling fine today with no concerning complaints.  She denied having any current chest pain, shortness of breath, cough or hemoptysis.  She denied having any fever or chills.  She has no nausea, vomiting, diarrhea or constipation.  She has no headache or visual changes.  She is here today for evaluation with repeat CT scan of the chest for restaging of her disease.  MEDICAL HISTORY: Past Medical History:  Diagnosis Date   ADD (attention deficit disorder)    Arthritis    "right knee" (04/19/2013)   Asthma 04/03/2019   CHF (congestive heart failure) (HCC)    Chronic diastolic congestive heart failure ([place])    Claustrophobia    Depression    Dyspnea    Dysrhythmia    Fracture of right tibial plateau 05/2016   GERD (gastroesophageal reflux disease)    Heart murmur    Incidental pulmonary nodule, greater than or equal to 47mm 06/06/2019   Needs f/u CT in 3 months   Migraine    "bad when I was in 5th or 6th grade; I can usually ward them off by resting my head now" (04/19/2013)   MVP (mitral valve prolapse)    S/P minimally-invasive mitral valve repair 08/03/2019   Complex valvuloplasty including artificial Gore-tex neochords x12 and Sorin Memo 4D ring annuloplasty, size 38 mm   S/P patent foramen ovale closure 08/03/2019   Sleep apnea    has not used mouth guard in 5 years no cpap used    Squamous carcinoma ?1999   "upper left groin" (04/19/2013)     ALLERGIES:  is allergic to penicillins, codeine, milk-related compounds, and morphine and related.  MEDICATIONS:  Current Outpatient Medications  Medication Sig Dispense Refill   acetaminophen (TYLENOL) 500 MG tablet Take 500-1,000 mg by mouth every 6 (six) hours as needed for moderate pain (pain).      Alum Hydroxide-Mag Carbonate (GAVISCON EXTRA STRENGTH) 160-105 MG CHEW Chew 1 tablet by mouth 2 (two) times daily as needed (indigestion).      aspirin EC 81 MG EC tablet Take 1 tablet (81 mg total) by mouth daily.     Biotin 5000 MCG TABS Take 5,000 mcg by mouth daily.     BREO ELLIPTA 100-25 MCG/INH AEPB USE 1 PUFF DAILY AS DIRECTED 60 each 0   buPROPion (WELLBUTRIN XL) 150 MG 24 hr tablet Take 1 tablet by mouth daily.     Calcium-Magnesium-Zinc 333-133-5 MG TABS Take 1-3 tablets by mouth daily at 12 noon.     doxycycline (VIBRAMYCIN) 100 MG capsule Take 1 capsule (100 mg total) one hour prior to any dental procedure 2 capsule 3   montelukast (SINGULAIR) 10 MG tablet Take 10 mg by mouth daily as needed (respiratory issues.).     Multiple Vitamins-Minerals (CENTRUM SILVER 50+WOMEN PO) Take 1 tablet by mouth daily.     No current facility-administered medications for this visit.    SURGICAL HISTORY:  Past Surgical History:  Procedure Laterality Date  CARDIAC CATHETERIZATION  05/26/2019   CHOLECYSTECTOMY  04/18/2013   CHOLECYSTECTOMY N/A 04/18/2013   Procedure: LAPAROSCOPIC CHOLECYSTECTOMY WITH INTRAOPERATIVE CHOLANGIOGRAM;  Surgeon: Ralene Ok, MD;  Location: [facility];  Service: General;  Laterality: N/A;   COLONOSCOPY W/ POLYPECTOMY     EYE SURGERY Bilateral FALL  2016   IOC FOR CATARACTS   FRACTURE SURGERY     INTERCOSTAL NERVE BLOCK Left 11/23/2019   Procedure: Intercostal Nerve Block;  Surgeon: Melrose Nakayama, MD;  Location: [facility];  Service: Thoracic;  Laterality: Left;   JOINT REPLACEMENT     KNEE ARTHROSCOPY Right 12/2007   "meniscus repair" (04/19/2013)   LYMPH NODE  DISSECTION Left 11/23/2019   Procedure: Lymph Node Dissection;  Surgeon: Melrose Nakayama, MD;  Location: [facility];  Service: Thoracic;  Laterality: Left;   MITRAL VALVE REPAIR Right 08/03/2019   Procedure: MINIMALLY INVASIVE MITRAL VALVE REPAIR (MVR) using Memo 4D 23 MM Mitral Valve.;  Surgeon: Rexene Alberts, MD;  Location: [facility];  Service: Open Heart Surgery;  Laterality: Right;   REPAIR OF PATENT FORAMEN OVALE N/A 08/03/2019   Procedure: CLOSURE OF PATENT FORAMEN OVALE;  Surgeon: Rexene Alberts, MD;  Location: [facility];  Service: Open Heart Surgery;  Laterality: N/A;   RETINAL TEAR REPAIR CRYOTHERAPY Left ~ 1997   RIGHT/LEFT HEART CATH AND CORONARY ANGIOGRAPHY N/A 05/26/2019   Procedure: RIGHT/LEFT HEART CATH AND CORONARY ANGIOGRAPHY;  Surgeon: Jettie Booze, MD;  Location: [facility];  Service: Cardiovascular;  Laterality: N/A;   TEE WITHOUT CARDIOVERSION N/A 05/26/2019   Procedure: TRANSESOPHAGEAL ECHOCARDIOGRAM (TEE);  Surgeon: Josue Hector, MD;  Location: [facility];  Service: Cardiovascular;  Laterality: N/A;   TEE WITHOUT CARDIOVERSION N/A 08/03/2019   Procedure: TRANSESOPHAGEAL ECHOCARDIOGRAM (TEE);  Surgeon: Rexene Alberts, MD;  Location: [facility];  Service: Open Heart Surgery;  Laterality: N/A;   TONSILLECTOMY  ~ [place] Right 09/21/2016   Procedure: RIGHT TOTAL KNEE ARTHROPLASTY;  Surgeon: Gaynelle Arabian, MD;  Location: [facility];  Service: Orthopedics;  Laterality: Right;  requests 33mins with abductor block   VARICOSE VEIN SURGERY Right 2000's   VASCULAR SURGERY     WRIST SURGERY Right 1975   "had a knot taken off" (04/19/2013)    REVIEW OF SYSTEMS:  A comprehensive review of systems was negative.   PHYSICAL EXAMINATION: General appearance: alert, cooperative, and no distress Head: Normocephalic, without obvious abnormality, atraumatic Neck: no adenopathy, no JVD, supple, symmetrical, trachea midline, and thyroid not enlarged, symmetric, no  tenderness/mass/nodules Lymph nodes: Cervical, supraclavicular, and axillary nodes normal. Resp: clear to auscultation bilaterally Back: symmetric, no curvature. ROM normal. No CVA tenderness. Cardio: regular rate and rhythm, S1, S2 normal, no murmur, click, rub or gallop GI: soft, non-tender; bowel sounds normal; no masses,  no organomegaly Extremities: extremities normal, atraumatic, no cyanosis or edema  ECOG PERFORMANCE STATUS: 1 - Symptomatic but completely ambulatory  Blood pressure 124/74, pulse 89, temperature 97.6 F (36.4 C), temperature source Tympanic, resp. rate 20, height 5' 2.5" (1.588 m), weight 163 lb 11.2 oz (74.3 kg), SpO2 97 %.  LABORATORY DATA: Lab Results  Component Value Date   WBC 7.1 06/27/2021   HGB 14.1 06/27/2021   HCT 42.8 06/27/2021   MCV 90.5 06/27/2021   PLT 364 06/27/2021      Chemistry      Component Value Date/Time   NA 139 06/27/2021 1558   NA 140 05/12/2019 1243   K 4.6 06/27/2021 1558  CL 105 06/27/2021 1558   CO2 26 06/27/2021 1558   BUN 18 06/27/2021 1558   BUN 14 05/12/2019 1243   CREATININE 1.15 (H) 06/27/2021 1558   CREATININE 1.05 (H) 07/19/2019 1206      Component Value Date/Time   CALCIUM 9.4 06/27/2021 1558   ALKPHOS 65 06/27/2021 1558   AST 20 06/27/2021 1558   ALT 21 06/27/2021 1558   BILITOT 0.8 06/27/2021 1558       RADIOGRAPHIC STUDIES: CT Chest W Contrast  Result Date: 06/30/2021 CLINICAL DATA:  Non-small cell lung cancer, status post left lower lobectomy 2021 EXAM: CT CHEST WITH CONTRAST TECHNIQUE: Multidetector CT imaging of the chest was performed during intravenous contrast administration. CONTRAST:  52mL OMNIPAQUE IOHEXOL 350 MG/ML SOLN COMPARISON:  12/24/2020 FINDINGS: Cardiovascular: Aortic atherosclerosis. Normal heart size. Mitral valve prosthesis. No pericardial effusion. Mediastinum/Nodes: No enlarged mediastinal, hilar, or axillary lymph nodes. Small hiatal hernia. Thyroid gland, trachea, and esophagus  demonstrate no significant findings. Lungs/Pleura: Mild centrilobular emphysema. Status post left lower lobectomy. Multiple small bilateral ground-glass pulmonary nodules are unchanged, for example a 1.0 cm nodule of the left pulmonary apex (series 7, image 30) and a 0.8 cm nodule of the right pulmonary apex (series 7, image 29). Unchanged irregular, partially calcified subpleural nodule of the right lung base measuring 1.4 x 1.3 cm (series 7, image 112). No pleural effusion or pneumothorax. Upper Abdomen: No acute abnormality. Musculoskeletal: No chest wall mass or suspicious bone lesions identified. IMPRESSION: 1. Status post left lower lobectomy. No evidence of recurrent or metastatic disease in the chest. 2. Multiple small bilateral ground-glass pulmonary nodules are unchanged and remain nonspecific, statistically most likely infectious or inflammatory although multifocal indolent adenocarcinoma is not excluded. Continued attention on follow-up. 3. Unchanged irregular, partially calcified subpleural nodule of the right lung base, as above most likely benign and incidental. Attention on follow-up. 4. Emphysema. Aortic Atherosclerosis (ICD10-I70.0) and Emphysema (ICD10-J43.9). Electronically Signed   By: Delanna Ahmadi M.D.   On: 06/30/2021 10:13     ASSESSMENT AND PLAN: This is a very pleasant 75 years old white female diagnosed with a stage IA (T1c, N0, M0) non-small cell lung cancer, adenocarcinoma status post left lower lobectomy with lymph node dissection in May 2021 under the care of Dr. Roxan Hockey. The patient has been in observation since that time and she is feeling fine today with no concerning complaints. She had repeat CT scan of the chest performed recently.  I personally and independently reviewed the scan images and discussed the result and showed the images to the patient today. Her scan showed no concerning finding for disease recurrence or metastasis but she continues to have stable  pulmonary nodules in the right as well as the left lung concerning for early primary bronchogenic carcinoma that need close monitoring. I recommended for the patient to have repeat CT scan of the chest in 6 months for restaging of her disease. The patient was advised to call immediately if she has any concerning symptoms in the interval. The patient voices understanding of current disease status and treatment options and is in agreement with the current care plan.  All questions were answered. The patient knows to call the clinic with any problems, questions or concerns. We can certainly see the patient much sooner if necessary.  Disclaimer: This note was dictated with voice recognition software. Similar sounding words can inadvertently be transcribed and may not be corrected upon review.

## 2021-07-10 ENCOUNTER — Telehealth: Payer: Self-pay | Admitting: Internal Medicine

## 2021-07-10 NOTE — Telephone Encounter (Signed)
Sch per 12/112 los, pt aware

## 2021-07-18 DIAGNOSIS — I5030 Unspecified diastolic (congestive) heart failure: Secondary | ICD-10-CM | POA: Diagnosis not present

## 2021-07-18 DIAGNOSIS — F325 Major depressive disorder, single episode, in full remission: Secondary | ICD-10-CM | POA: Diagnosis not present

## 2021-08-29 DIAGNOSIS — F3342 Major depressive disorder, recurrent, in full remission: Secondary | ICD-10-CM | POA: Diagnosis not present

## 2021-08-29 DIAGNOSIS — I5030 Unspecified diastolic (congestive) heart failure: Secondary | ICD-10-CM | POA: Diagnosis not present

## 2021-09-17 DIAGNOSIS — K219 Gastro-esophageal reflux disease without esophagitis: Secondary | ICD-10-CM | POA: Diagnosis not present

## 2021-09-26 DIAGNOSIS — G4733 Obstructive sleep apnea (adult) (pediatric): Secondary | ICD-10-CM | POA: Diagnosis not present

## 2021-09-26 DIAGNOSIS — R911 Solitary pulmonary nodule: Secondary | ICD-10-CM | POA: Diagnosis not present

## 2021-09-26 DIAGNOSIS — M8580 Other specified disorders of bone density and structure, unspecified site: Secondary | ICD-10-CM | POA: Diagnosis not present

## 2021-09-26 DIAGNOSIS — R7301 Impaired fasting glucose: Secondary | ICD-10-CM | POA: Diagnosis not present

## 2021-09-26 DIAGNOSIS — I340 Nonrheumatic mitral (valve) insufficiency: Secondary | ICD-10-CM | POA: Diagnosis not present

## 2021-09-26 DIAGNOSIS — F39 Unspecified mood [affective] disorder: Secondary | ICD-10-CM | POA: Diagnosis not present

## 2021-09-26 DIAGNOSIS — R0789 Other chest pain: Secondary | ICD-10-CM | POA: Diagnosis not present

## 2021-09-26 DIAGNOSIS — E785 Hyperlipidemia, unspecified: Secondary | ICD-10-CM | POA: Diagnosis not present

## 2021-09-26 DIAGNOSIS — J45909 Unspecified asthma, uncomplicated: Secondary | ICD-10-CM | POA: Diagnosis not present

## 2021-09-26 DIAGNOSIS — N1831 Chronic kidney disease, stage 3a: Secondary | ICD-10-CM | POA: Diagnosis not present

## 2021-09-26 DIAGNOSIS — J302 Other seasonal allergic rhinitis: Secondary | ICD-10-CM | POA: Diagnosis not present

## 2021-09-26 DIAGNOSIS — K219 Gastro-esophageal reflux disease without esophagitis: Secondary | ICD-10-CM | POA: Diagnosis not present

## 2021-10-15 DIAGNOSIS — K219 Gastro-esophageal reflux disease without esophagitis: Secondary | ICD-10-CM | POA: Diagnosis not present

## 2021-10-24 DIAGNOSIS — H903 Sensorineural hearing loss, bilateral: Secondary | ICD-10-CM | POA: Diagnosis not present

## 2021-11-12 ENCOUNTER — Telehealth: Payer: Self-pay | Admitting: Internal Medicine

## 2021-11-12 NOTE — Telephone Encounter (Signed)
Called patient regarding upcoming June appointments, patient is notified.  

## 2021-11-20 DIAGNOSIS — K219 Gastro-esophageal reflux disease without esophagitis: Secondary | ICD-10-CM | POA: Diagnosis not present

## 2021-11-20 DIAGNOSIS — K449 Diaphragmatic hernia without obstruction or gangrene: Secondary | ICD-10-CM | POA: Diagnosis not present

## 2021-11-24 DIAGNOSIS — K219 Gastro-esophageal reflux disease without esophagitis: Secondary | ICD-10-CM | POA: Diagnosis not present

## 2021-11-26 DIAGNOSIS — R0789 Other chest pain: Secondary | ICD-10-CM | POA: Diagnosis not present

## 2021-11-26 DIAGNOSIS — M179 Osteoarthritis of knee, unspecified: Secondary | ICD-10-CM | POA: Diagnosis not present

## 2021-11-26 DIAGNOSIS — K219 Gastro-esophageal reflux disease without esophagitis: Secondary | ICD-10-CM | POA: Diagnosis not present

## 2021-11-26 DIAGNOSIS — G4733 Obstructive sleep apnea (adult) (pediatric): Secondary | ICD-10-CM | POA: Diagnosis not present

## 2021-11-26 DIAGNOSIS — I340 Nonrheumatic mitral (valve) insufficiency: Secondary | ICD-10-CM | POA: Diagnosis not present

## 2021-11-26 DIAGNOSIS — Z7689 Persons encountering health services in other specified circumstances: Secondary | ICD-10-CM | POA: Diagnosis not present

## 2021-11-26 DIAGNOSIS — J45909 Unspecified asthma, uncomplicated: Secondary | ICD-10-CM | POA: Diagnosis not present

## 2021-11-26 DIAGNOSIS — R7301 Impaired fasting glucose: Secondary | ICD-10-CM | POA: Diagnosis not present

## 2021-11-26 DIAGNOSIS — R911 Solitary pulmonary nodule: Secondary | ICD-10-CM | POA: Diagnosis not present

## 2021-11-26 DIAGNOSIS — N281 Cyst of kidney, acquired: Secondary | ICD-10-CM | POA: Diagnosis not present

## 2021-11-26 DIAGNOSIS — M8580 Other specified disorders of bone density and structure, unspecified site: Secondary | ICD-10-CM | POA: Diagnosis not present

## 2021-11-26 DIAGNOSIS — N1831 Chronic kidney disease, stage 3a: Secondary | ICD-10-CM | POA: Diagnosis not present

## 2021-11-26 DIAGNOSIS — E785 Hyperlipidemia, unspecified: Secondary | ICD-10-CM | POA: Diagnosis not present

## 2021-12-18 ENCOUNTER — Ambulatory Visit (HOSPITAL_COMMUNITY)
Admission: RE | Admit: 2021-12-18 | Discharge: 2021-12-18 | Disposition: A | Discharge status: Home / Self Care | Payer: PPO | Source: Ambulatory Visit | Attending: Internal Medicine | Admitting: Internal Medicine

## 2021-12-18 ENCOUNTER — Inpatient Hospital Stay: Payer: PPO | Attending: Internal Medicine

## 2021-12-18 ENCOUNTER — Other Ambulatory Visit: Payer: Self-pay

## 2021-12-18 DIAGNOSIS — Z85118 Personal history of other malignant neoplasm of bronchus and lung: Secondary | ICD-10-CM | POA: Insufficient documentation

## 2021-12-18 DIAGNOSIS — R918 Other nonspecific abnormal finding of lung field: Secondary | ICD-10-CM | POA: Diagnosis not present

## 2021-12-18 DIAGNOSIS — Z7982 Long term (current) use of aspirin: Secondary | ICD-10-CM | POA: Insufficient documentation

## 2021-12-18 DIAGNOSIS — C3490 Malignant neoplasm of unspecified part of unspecified bronchus or lung: Secondary | ICD-10-CM | POA: Diagnosis not present

## 2021-12-18 DIAGNOSIS — J439 Emphysema, unspecified: Secondary | ICD-10-CM | POA: Diagnosis not present

## 2021-12-18 LAB — CMP (CANCER CENTER ONLY)
ALT: 21 U/L (ref 0–44)
AST: 16 U/L (ref 15–41)
Albumin: 4 g/dL (ref 3.5–5.0)
Alkaline Phosphatase: 59 U/L (ref 38–126)
Anion gap: 4 — ABNORMAL LOW (ref 5–15)
BUN: 18 mg/dL (ref 8–23)
CO2: 28 mmol/L (ref 22–32)
Calcium: 9.2 mg/dL (ref 8.9–10.3)
Chloride: 106 mmol/L (ref 98–111)
Creatinine: 1.06 mg/dL — ABNORMAL HIGH (ref 0.44–1.00)
GFR, Estimated: 55 mL/min — ABNORMAL LOW (ref >60)
Glucose, Bld: 101 mg/dL — ABNORMAL HIGH (ref 70–99)
Potassium: 4.4 mmol/L (ref 3.5–5.1)
Sodium: 138 mmol/L (ref 135–145)
Total Bilirubin: 0.7 mg/dL (ref 0.3–1.2)
Total Protein: 6.7 g/dL (ref 6.5–8.1)

## 2021-12-18 LAB — CBC WITH DIFFERENTIAL (CANCER CENTER ONLY)
Abs Immature Granulocytes: 0.01 10*3/uL (ref 0.00–0.07)
Basophils Absolute: 0.1 10*3/uL (ref 0.0–0.1)
Basophils Relative: 1 %
Eosinophils Absolute: 0.2 10*3/uL (ref 0.0–0.5)
Eosinophils Relative: 3 %
HCT: 41.6 % (ref 36.0–46.0)
Hemoglobin: 14.1 g/dL (ref 12.0–15.0)
Immature Granulocytes: 0 %
Lymphocytes Relative: 21 %
Lymphs Abs: 1.3 10*3/uL (ref 0.7–4.0)
MCH: 30.6 pg (ref 26.0–34.0)
MCHC: 33.9 g/dL (ref 30.0–36.0)
MCV: 90.2 fL (ref 80.0–100.0)
Monocytes Absolute: 0.7 10*3/uL (ref 0.1–1.0)
Monocytes Relative: 11 %
Neutro Abs: 4 10*3/uL (ref 1.7–7.7)
Neutrophils Relative %: 64 %
Platelet Count: 328 10*3/uL (ref 150–400)
RBC: 4.61 MIL/uL (ref 3.87–5.11)
RDW: 12.3 % (ref 11.5–15.5)
WBC Count: 6.3 10*3/uL (ref 4.0–10.5)
nRBC: 0 % (ref 0.0–0.2)

## 2021-12-18 MED ORDER — IOHEXOL 300 MG/ML  SOLN
75.0000 mL | Freq: Once | INTRAMUSCULAR | Status: AC | PRN
  Administered 2021-12-18: 75 mL via INTRAVENOUS

## 2021-12-19 ENCOUNTER — Inpatient Hospital Stay: Payer: PPO

## 2021-12-22 ENCOUNTER — Ambulatory Visit: Payer: PPO | Admitting: Internal Medicine

## 2021-12-23 ENCOUNTER — Inpatient Hospital Stay (HOSPITAL_BASED_OUTPATIENT_CLINIC_OR_DEPARTMENT_OTHER): Payer: PPO | Admitting: Internal Medicine

## 2021-12-23 ENCOUNTER — Other Ambulatory Visit: Payer: Self-pay

## 2021-12-23 VITALS — BP 129/74 | HR 80 | Temp 98.3°F | Resp 18 | Ht 62.5 in | Wt 169.4 lb

## 2021-12-23 DIAGNOSIS — Z85118 Personal history of other malignant neoplasm of bronchus and lung: Secondary | ICD-10-CM | POA: Diagnosis not present

## 2021-12-23 DIAGNOSIS — C3492 Malignant neoplasm of unspecified part of left bronchus or lung: Secondary | ICD-10-CM | POA: Diagnosis not present

## 2021-12-23 DIAGNOSIS — Z7982 Long term (current) use of aspirin: Secondary | ICD-10-CM | POA: Diagnosis not present

## 2021-12-23 DIAGNOSIS — C3490 Malignant neoplasm of unspecified part of unspecified bronchus or lung: Secondary | ICD-10-CM | POA: Diagnosis not present

## 2021-12-23 NOTE — Progress Notes (Signed)
[facility] [phone]   [phone]  OFFICE PROGRESS NOTE  Anne Solian, MD [place] [address]  DIAGNOSIS: Stage IA (T1c, N0, M0) non-small cell lung cancer, adenocarcinoma diagnosed in May 2021.  PRIOR THERAPY: Status post left lower lobectomy with lymph node dissection under the care of Dr. Roxan Hockey on 11/23/2019.  CURRENT THERAPY: Observation.  INTERVAL HISTORY: Anne Hogan 76 y.o. female returns to the clinic today for 6 months follow-up visit.  The patient is feeling fine today with no concerning complaints.  She denied having any current chest pain, shortness of breath, cough or hemoptysis.  She has no nausea, vomiting, diarrhea or constipation.  She has no headache or visual changes.  She has some intermittent neuropathy on the left side of the chest from her previous surgical scar.  The patient denied having any recent weight loss or night sweats.  She had repeat CT scan of the chest performed recently and she is here for evaluation and discussion of her risk her results.  MEDICAL HISTORY: Past Medical History:  Diagnosis Date   ADD (attention deficit disorder)    Arthritis    "right knee" (04/19/2013)   Asthma 04/03/2019   CHF (congestive heart failure) (HCC)    Chronic diastolic congestive heart failure ([place])    Claustrophobia    Depression    Dyspnea    Dysrhythmia    Fracture of right tibial plateau 05/2016   GERD (gastroesophageal reflux disease)    Heart murmur    Incidental pulmonary nodule, greater than or equal to 48mm 06/06/2019   Needs f/u CT in 3 months   Migraine    "bad when I was in 5th or 6th grade; I can usually ward them off by resting my head now" (04/19/2013)   MVP (mitral valve prolapse)    S/P minimally-invasive mitral valve repair 08/03/2019   Complex valvuloplasty including artificial Gore-tex neochords x12 and Sorin Memo 4D ring annuloplasty, size 38 mm   S/P patent foramen ovale  closure 08/03/2019   Sleep apnea    has not used mouth guard in 5 years no cpap used    Squamous carcinoma ?1999   "upper left groin" (04/19/2013)    ALLERGIES:  is allergic to penicillins, codeine, milk-related compounds, and morphine and related.  MEDICATIONS:  Current Outpatient Medications  Medication Sig Dispense Refill   acetaminophen (TYLENOL) 500 MG tablet Take 500-1,000 mg by mouth every 6 (six) hours as needed for moderate pain (pain).      Alum Hydroxide-Mag Carbonate (GAVISCON EXTRA STRENGTH) 160-105 MG CHEW Chew 1 tablet by mouth 2 (two) times daily as needed (indigestion).      aspirin EC 81 MG EC tablet Take 1 tablet (81 mg total) by mouth daily.     Biotin 5000 MCG TABS Take 5,000 mcg by mouth daily.     BREO ELLIPTA 100-25 MCG/INH AEPB USE 1 PUFF DAILY AS DIRECTED 60 each 0   Calcium-Magnesium-Zinc 333-133-5 MG TABS Take 1-3 tablets by mouth daily at 12 noon.     doxycycline (VIBRAMYCIN) 100 MG capsule Take 1 capsule (100 mg total) one hour prior to any dental procedure 2 capsule 3   montelukast (SINGULAIR) 10 MG tablet Take 10 mg by mouth daily as needed (respiratory issues.).     Multiple Vitamins-Minerals (CENTRUM SILVER 50+WOMEN PO) Take 1 tablet by mouth daily.     No current facility-administered medications for this visit.    SURGICAL HISTORY:  Past Surgical History:  Procedure Laterality Date   CARDIAC CATHETERIZATION  05/26/2019   CHOLECYSTECTOMY  04/18/2013   CHOLECYSTECTOMY N/A 04/18/2013   Procedure: LAPAROSCOPIC CHOLECYSTECTOMY WITH INTRAOPERATIVE CHOLANGIOGRAM;  Surgeon: Ralene Ok, MD;  Location: [facility];  Service: General;  Laterality: N/A;   COLONOSCOPY W/ POLYPECTOMY     EYE SURGERY Bilateral FALL  2016   IOC FOR CATARACTS   FRACTURE SURGERY     INTERCOSTAL NERVE BLOCK Left 11/23/2019   Procedure: Intercostal Nerve Block;  Surgeon: Melrose Nakayama, MD;  Location: [facility];  Service: Thoracic;  Laterality: Left;   JOINT REPLACEMENT     KNEE  ARTHROSCOPY Right 12/2007   "meniscus repair" (04/19/2013)   LYMPH NODE DISSECTION Left 11/23/2019   Procedure: Lymph Node Dissection;  Surgeon: Melrose Nakayama, MD;  Location: [facility];  Service: Thoracic;  Laterality: Left;   MITRAL VALVE REPAIR Right 08/03/2019   Procedure: MINIMALLY INVASIVE MITRAL VALVE REPAIR (MVR) using Memo 4D 56 MM Mitral Valve.;  Surgeon: Rexene Alberts, MD;  Location: [facility];  Service: Open Heart Surgery;  Laterality: Right;   REPAIR OF PATENT FORAMEN OVALE N/A 08/03/2019   Procedure: CLOSURE OF PATENT FORAMEN OVALE;  Surgeon: Rexene Alberts, MD;  Location: [facility];  Service: Open Heart Surgery;  Laterality: N/A;   RETINAL TEAR REPAIR CRYOTHERAPY Left ~ 1997   RIGHT/LEFT HEART CATH AND CORONARY ANGIOGRAPHY N/A 05/26/2019   Procedure: RIGHT/LEFT HEART CATH AND CORONARY ANGIOGRAPHY;  Surgeon: Jettie Booze, MD;  Location: [facility];  Service: Cardiovascular;  Laterality: N/A;   TEE WITHOUT CARDIOVERSION N/A 05/26/2019   Procedure: TRANSESOPHAGEAL ECHOCARDIOGRAM (TEE);  Surgeon: Josue Hector, MD;  Location: [facility];  Service: Cardiovascular;  Laterality: N/A;   TEE WITHOUT CARDIOVERSION N/A 08/03/2019   Procedure: TRANSESOPHAGEAL ECHOCARDIOGRAM (TEE);  Surgeon: Rexene Alberts, MD;  Location: [facility];  Service: Open Heart Surgery;  Laterality: N/A;   TONSILLECTOMY  ~ [place] Right 09/21/2016   Procedure: RIGHT TOTAL KNEE ARTHROPLASTY;  Surgeon: Gaynelle Arabian, MD;  Location: [facility];  Service: Orthopedics;  Laterality: Right;  requests 27mins with abductor block   VARICOSE VEIN SURGERY Right 2000's   VASCULAR SURGERY     WRIST SURGERY Right 1975   "had a knot taken off" (04/19/2013)    REVIEW OF SYSTEMS:  A comprehensive review of systems was negative.   PHYSICAL EXAMINATION: General appearance: alert, cooperative, and no distress Head: Normocephalic, without obvious abnormality, atraumatic Neck: no adenopathy, no JVD, supple,  symmetrical, trachea midline, and thyroid not enlarged, symmetric, no tenderness/mass/nodules Lymph nodes: Cervical, supraclavicular, and axillary nodes normal. Resp: clear to auscultation bilaterally Back: symmetric, no curvature. ROM normal. No CVA tenderness. Cardio: regular rate and rhythm, S1, S2 normal, no murmur, click, rub or gallop GI: soft, non-tender; bowel sounds normal; no masses,  no organomegaly Extremities: extremities normal, atraumatic, no cyanosis or edema  ECOG PERFORMANCE STATUS: 1 - Symptomatic but completely ambulatory  Blood pressure 129/74, pulse 80, temperature 98.3 F (36.8 C), temperature source Oral, resp. rate 18, height 5' 2.5" (1.588 m), weight 169 lb 6.4 oz (76.8 kg), SpO2 98 %.  LABORATORY DATA: Lab Results  Component Value Date   WBC 6.3 12/18/2021   HGB 14.1 12/18/2021   HCT 41.6 12/18/2021   MCV 90.2 12/18/2021   PLT 328 12/18/2021      Chemistry      Component Value Date/Time   NA 138 12/18/2021 1405   NA  140 05/12/2019 1243   K 4.4 12/18/2021 1405   CL 106 12/18/2021 1405   CO2 28 12/18/2021 1405   BUN 18 12/18/2021 1405   BUN 14 05/12/2019 1243   CREATININE 1.06 (H) 12/18/2021 1405   CREATININE 1.05 (H) 07/19/2019 1206      Component Value Date/Time   CALCIUM 9.2 12/18/2021 1405   ALKPHOS 59 12/18/2021 1405   AST 16 12/18/2021 1405   ALT 21 12/18/2021 1405   BILITOT 0.7 12/18/2021 1405       RADIOGRAPHIC STUDIES: CT Chest W Contrast  Result Date: 12/19/2021 CLINICAL DATA:  Non-small cell lung cancer, staging, six-month follow-up. * Tracking Code: BO * EXAM: CT CHEST WITH CONTRAST TECHNIQUE: Multidetector CT imaging of the chest was performed during intravenous contrast administration. RADIATION DOSE REDUCTION: This exam was performed according to the departmental dose-optimization program which includes automated exposure control, adjustment of the mA and/or kV according to patient size and/or use of iterative reconstruction  technique. CONTRAST:  15mL OMNIPAQUE IOHEXOL 300 MG/ML  SOLN COMPARISON:  06/27/2021 and PET 07/28/2019. FINDINGS: Cardiovascular: Atherosclerotic calcification of the aorta. Heart size normal. No pericardial effusion. Mediastinum/Nodes: No pathologically enlarged mediastinal, hilar or axillary lymph nodes. Esophagus is unremarkable. Small hiatal hernia. Lungs/Pleura: Mild centrilobular emphysema. Multiple bilateral pure ground-glass nodules measure up to 10 mm in the apical left upper lobe (5/26), unchanged from 06/27/2021. No solid components. Left lower lobectomy. Subpleural calcification along the right hemidiaphragm. No pleural fluid. Airway is otherwise unremarkable. Upper Abdomen: 1.8 cm fluid density cyst in the right hepatic lobe. Subcentimeter low-attenuation lesion in the posterior right hepatic lobe, likely a cyst. Visualized portions of the liver and adrenal glands are otherwise unremarkable. Exophytic fluid density lesion off the right kidney, indicative of a cyst on 07/28/2019. No follow-up necessary. Visualized portions of the kidneys, spleen, pancreas, stomach and bowel are otherwise unremarkable with the exception of an aforementioned small hiatal hernia. Musculoskeletal: Possible old anterior right rib fracture. No worrisome lytic or sclerotic lesions. IMPRESSION: 1. Multiple bilateral ground-glass nodules, unchanged from 06/27/2021. No solid components. Continued attention on follow-up exams is warranted as indolent adenocarcinoma scan not be excluded. 2.  Aortic atherosclerosis (ICD10-I70.0). 3.  Emphysema (ICD10-J43.9). Electronically Signed   By: Lorin Picket M.D.   On: 12/19/2021 11:09     ASSESSMENT AND PLAN: This is a very pleasant 76 years old white female diagnosed with a stage IA (T1c, N0, M0) non-small cell lung cancer, adenocarcinoma status post left lower lobectomy with lymph node dissection in May 2021 under the care of Dr. Roxan Hockey. The patient has been in observation  since that time and she is feeling fine with no concerning complaints. She had repeat CT scan of the chest performed recently.  Her scan showed no concerning findings for disease progression but she continues to have multiple bilateral groundglass nodules that need close monitoring. I recommended for the patient to have repeat CT scan of the chest in 6 months for further evaluation of these lesions. She was advised to call immediately if she has any other concerning symptoms in the interval. The patient voices understanding of current disease status and treatment options and is in agreement with the current care plan.  All questions were answered. The patient knows to call the clinic with any problems, questions or concerns. We can certainly see the patient much sooner if necessary.  Disclaimer: This note was dictated with voice recognition software. Similar sounding words can inadvertently be transcribed and may not be corrected  upon review.

## 2022-01-01 DIAGNOSIS — M8589 Other specified disorders of bone density and structure, multiple sites: Secondary | ICD-10-CM | POA: Diagnosis not present

## 2022-02-05 DIAGNOSIS — K5900 Constipation, unspecified: Secondary | ICD-10-CM | POA: Diagnosis not present

## 2022-02-05 DIAGNOSIS — K219 Gastro-esophageal reflux disease without esophagitis: Secondary | ICD-10-CM | POA: Diagnosis not present

## 2022-04-01 ENCOUNTER — Ambulatory Visit: Payer: PPO | Admitting: Student

## 2022-04-30 NOTE — Progress Notes (Signed)
PCP:  [name], MD Primary Cardiologist: Will Meredith Leeds, MD Electrophysiologist: Constance Haw, MD   Anne Hogan is a 76 y.o. female seen today for Will Meredith Leeds, MD for routine electrophysiology followup. Since last being seen in our clinic the patient reports doing very well.  she denies chest pain, palpitations, dyspnea, PND, orthopnea, nausea, vomiting, dizziness, syncope, edema, weight gain, or early satiety.   Past Medical History:  Diagnosis Date   ADD (attention deficit disorder)    Arthritis    "right knee" (04/19/2013)   Asthma 04/03/2019   CHF (congestive heart failure) (HCC)    Chronic diastolic congestive heart failure ([place])    Claustrophobia    Depression    Dyspnea    Dysrhythmia    Fracture of right tibial plateau 05/2016   GERD (gastroesophageal reflux disease)    Heart murmur    Incidental pulmonary nodule, greater than or equal to 78mm 06/06/2019   Needs f/u CT in 3 months   Migraine    "bad when I was in 5th or 6th grade; I can usually ward them off by resting my head now" (04/19/2013)   MVP (mitral valve prolapse)    S/P minimally-invasive mitral valve repair 08/03/2019   Complex valvuloplasty including artificial Gore-tex neochords x12 and Sorin Memo 4D ring annuloplasty, size 38 mm   S/P patent foramen ovale closure 08/03/2019   Sleep apnea    has not used mouth guard in 5 years no cpap used    Squamous carcinoma ?1999   "upper left groin" (04/19/2013)   Past Surgical History:  Procedure Laterality Date   CARDIAC CATHETERIZATION  05/26/2019   CHOLECYSTECTOMY  04/18/2013   CHOLECYSTECTOMY N/A 04/18/2013   Procedure: LAPAROSCOPIC CHOLECYSTECTOMY WITH INTRAOPERATIVE CHOLANGIOGRAM;  Surgeon: Ralene Ok, MD;  Location: [facility];  Service: General;  Laterality: N/A;   COLONOSCOPY W/ POLYPECTOMY     EYE SURGERY Bilateral FALL  2016   IOC FOR CATARACTS   FRACTURE SURGERY     INTERCOSTAL NERVE BLOCK Left 11/23/2019   Procedure:  Intercostal Nerve Block;  Surgeon: Melrose Nakayama, MD;  Location: [facility];  Service: Thoracic;  Laterality: Left;   JOINT REPLACEMENT     KNEE ARTHROSCOPY Right 12/2007   "meniscus repair" (04/19/2013)   LYMPH NODE DISSECTION Left 11/23/2019   Procedure: Lymph Node Dissection;  Surgeon: Melrose Nakayama, MD;  Location: [facility];  Service: Thoracic;  Laterality: Left;   MITRAL VALVE REPAIR Right 08/03/2019   Procedure: MINIMALLY INVASIVE MITRAL VALVE REPAIR (MVR) using Memo 4D 75 MM Mitral Valve.;  Surgeon: Rexene Alberts, MD;  Location: [facility];  Service: Open Heart Surgery;  Laterality: Right;   REPAIR OF PATENT FORAMEN OVALE N/A 08/03/2019   Procedure: CLOSURE OF PATENT FORAMEN OVALE;  Surgeon: Rexene Alberts, MD;  Location: [facility];  Service: Open Heart Surgery;  Laterality: N/A;   RETINAL TEAR REPAIR CRYOTHERAPY Left ~ 1997   RIGHT/LEFT HEART CATH AND CORONARY ANGIOGRAPHY N/A 05/26/2019   Procedure: RIGHT/LEFT HEART CATH AND CORONARY ANGIOGRAPHY;  Surgeon: Jettie Booze, MD;  Location: [facility];  Service: Cardiovascular;  Laterality: N/A;   TEE WITHOUT CARDIOVERSION N/A 05/26/2019   Procedure: TRANSESOPHAGEAL ECHOCARDIOGRAM (TEE);  Surgeon: Josue Hector, MD;  Location: [facility]OSCOPY;  Service: Cardiovascular;  Laterality: N/A;   TEE WITHOUT CARDIOVERSION N/A 08/03/2019   Procedure: TRANSESOPHAGEAL ECHOCARDIOGRAM (TEE);  Surgeon: Rexene Alberts, MD;  Location: [facility];  Service: Open Heart Surgery;  Laterality:  N/A;   TONSILLECTOMY  ~ [place] Right 09/21/2016   Procedure: RIGHT TOTAL KNEE ARTHROPLASTY;  Surgeon: Gaynelle Arabian, MD;  Location: [facility];  Service: Orthopedics;  Laterality: Right;  requests 74mins with abductor block   VARICOSE VEIN SURGERY Right 2000's   VASCULAR SURGERY     WRIST SURGERY Right 1975   "had a knot taken off" (04/19/2013)    Current Outpatient Medications  Medication Sig Dispense Refill   acetaminophen (TYLENOL) 500 MG  tablet Take 500-1,000 mg by mouth every 6 (six) hours as needed for moderate pain (pain).      aspirin EC 81 MG EC tablet Take 1 tablet (81 mg total) by mouth daily.     BREO ELLIPTA 100-25 MCG/INH AEPB USE 1 PUFF DAILY AS DIRECTED 60 each 0   montelukast (SINGULAIR) 10 MG tablet Take 10 mg by mouth daily as needed (respiratory issues.).     Multiple Vitamins-Minerals (CENTRUM SILVER 50+WOMEN PO) Take 1 tablet by mouth daily.     Alum Hydroxide-Mag Carbonate (GAVISCON EXTRA STRENGTH) 160-105 MG CHEW Chew 1 tablet by mouth 2 (two) times daily as needed (indigestion).  (Patient not taking: Reported on 05/01/2022)     Biotin 5000 MCG TABS Take 5,000 mcg by mouth daily. (Patient not taking: Reported on 05/01/2022)     Calcium-Magnesium-Zinc 333-133-5 MG TABS Take 1-3 tablets by mouth daily at 12 noon. (Patient not taking: Reported on 05/01/2022)     doxycycline (VIBRAMYCIN) 100 MG capsule Take 1 capsule (100 mg total) one hour prior to any dental procedure 2 capsule 3   No current facility-administered medications for this visit.    Allergies  Allergen Reactions   Penicillins Rash    Has patient had a PCN reaction causing immediate rash, facial/tongue/throat swelling, SOB or lightheadedness with hypotension: No Has patient had a PCN reaction causing severe rash involving mucus membranes or skin necrosis: no Has patient had a PCN reaction that required hospitalization: No Has patient had a PCN reaction occurring within the last 10 years: no If all of the above answers are "NO", then may proceed with Cephalosporin use.     Codeine Other (See Comments)    Possible sensitivity, patient not sure   Milk-Related Compounds Nausea And Vomiting and Other (See Comments)    Can have ice cream, pt just doesn't want to drink milk   Morphine And Related     "strange sensation across midsection and chest"    Social History   Socioeconomic History   Marital status: Widowed    Spouse name: Not on file    Number of children: Not on file   Years of education: Not on file   Highest education level: Master's degree (e.g., MA, MS, MEng, MEd, MSW, MBA)  Occupational History   Occupation: Retired  Tobacco Use   Smoking status: Former    Packs/day: 1.50    Years: 20.00    Total pack years: 30.00    Types: Cigarettes    Quit date: 11/30/1986    Years since quitting: 35.4   Smokeless tobacco: Never  Vaping Use   Vaping Use: Never used  Substance and Sexual Activity   Alcohol use: Yes    Comment: RARE   Drug use: No   Sexual activity: Not Currently  Other Topics Concern   Not on file  Social History Narrative   Not on file   Social Determinants of Health   Financial Resource Strain: Not on file  Food  Insecurity: Not on file  Transportation Needs: Not on file  Physical Activity: Not on file  Stress: Not on file  Social Connections: Not on file  Intimate Partner Violence: Not on file     Review of Systems: All other systems reviewed and are otherwise negative except as noted above.  Physical Exam: Vitals:   05/01/22 1106  BP: 110/72  Pulse: 76  Weight: 165 lb (74.8 kg)  Height: 5' 2.5" (1.588 m)    GEN- The patient is well appearing, alert and oriented x 3 today.   HEENT: normocephalic, atraumatic; sclera clear, conjunctiva pink; hearing intact; oropharynx clear; neck supple, no JVP Lymph- no cervical lymphadenopathy Lungs- Clear to ausculation bilaterally, normal work of breathing.  No wheezes, rales, rhonchi Heart- Regular rate and rhythm, no murmurs, rubs or gallops, PMI not laterally displaced GI- soft, non-tender, non-distended, bowel sounds present, no hepatosplenomegaly Extremities- No peripheral edema. no clubbing or cyanosis; DP/PT/radial pulses 2+ bilaterally MS- no significant deformity or atrophy Skin- warm and dry, no rash or lesion Psych- euthymic mood, full affect Neuro- strength and sensation are intact  EKG is ordered. Personal review of EKG from today  shows NSR at 76 bpm  Additional studies reviewed include: Previous EP office notes.   Assessment and Plan:  1.  Severe mitral regurgitation: Status post minimally invasive mitral valve repair and PFO closure.   Most recent echo 08/2019 shows a stable valve.  Will update for completeness No changes   2.  Palpitations:  Continues to have minor, but not limiting palpitations.  Cardiac monitor without evidence of arrhythmia.  Continue to monitor.   3.  Non-small cell lung cancer:  Found to be adenocarcinoma of the left lower lobe stage T1N0.   Status post lobectomy.   No current changes  Follow up with Dr. Curt Bears in 12 months. If she has more general cards issues, have recommended she establish with a general cardiologist.   Shirley Friar, PA-C  05/01/22 11:17 AM

## 2022-05-01 ENCOUNTER — Encounter: Payer: Self-pay | Admitting: Student

## 2022-05-01 ENCOUNTER — Ambulatory Visit: Payer: PPO | Attending: Student | Admitting: Student

## 2022-05-01 ENCOUNTER — Ambulatory Visit: Payer: PPO | Admitting: Student

## 2022-05-01 VITALS — BP 110/72 | HR 76 | Ht 62.5 in | Wt 165.0 lb

## 2022-05-01 DIAGNOSIS — R002 Palpitations: Secondary | ICD-10-CM

## 2022-05-01 DIAGNOSIS — I340 Nonrheumatic mitral (valve) insufficiency: Secondary | ICD-10-CM

## 2022-05-01 NOTE — Patient Instructions (Signed)
Medication Instructions:  Your physician recommends that you continue on your current medications as directed. Please refer to the Current Medication list given to you today.  *If you need a refill on your cardiac medications before your next appointment, please call your pharmacy*   Lab Work: None If you have labs (blood work) drawn today and your tests are completely normal, you will receive your results only by: [place] (if you have MyChart) OR A paper copy in the mail If you have any lab test that is abnormal or we need to change your treatment, we will call you to review the results.   Testing/Procedures: Your physician has requested that you have an echocardiogram. Echocardiography is a painless test that uses sound waves to create images of your heart. It provides your doctor with information about the size and shape of your heart and how well your heart's chambers and valves are working. This procedure takes approximately one hour. There are no restrictions for this procedure. Please do NOT wear cologne, perfume, aftershave, or lotions (deodorant is allowed). Please arrive 15 minutes prior to your appointment time.   Follow-Up: At [facility], you and your health needs are our priority.  As part of our continuing mission to provide you with exceptional heart care, we have created designated Provider Care Teams.  These Care Teams include your primary Cardiologist (physician) and Advanced Practice Providers (APPs -  Physician Assistants and Nurse Practitioners) who all work together to provide you with the care you need, when you need it.   Your next appointment:   1 year(s)  The format for your next appointment:   In Person  Provider:   Allegra Lai, MD

## 2022-05-05 DIAGNOSIS — H5203 Hypermetropia, bilateral: Secondary | ICD-10-CM | POA: Diagnosis not present

## 2022-05-05 DIAGNOSIS — H52203 Unspecified astigmatism, bilateral: Secondary | ICD-10-CM | POA: Diagnosis not present

## 2022-05-05 DIAGNOSIS — H43813 Vitreous degeneration, bilateral: Secondary | ICD-10-CM | POA: Diagnosis not present

## 2022-05-07 DIAGNOSIS — Z7951 Long term (current) use of inhaled steroids: Secondary | ICD-10-CM | POA: Diagnosis not present

## 2022-05-07 DIAGNOSIS — J45909 Unspecified asthma, uncomplicated: Secondary | ICD-10-CM | POA: Diagnosis not present

## 2022-05-07 DIAGNOSIS — N1831 Chronic kidney disease, stage 3a: Secondary | ICD-10-CM | POA: Diagnosis not present

## 2022-05-07 DIAGNOSIS — Z87891 Personal history of nicotine dependence: Secondary | ICD-10-CM | POA: Diagnosis not present

## 2022-05-21 ENCOUNTER — Ambulatory Visit (HOSPITAL_COMMUNITY): Payer: PPO | Attending: Student

## 2022-05-21 DIAGNOSIS — I340 Nonrheumatic mitral (valve) insufficiency: Secondary | ICD-10-CM | POA: Diagnosis not present

## 2022-05-21 DIAGNOSIS — R002 Palpitations: Secondary | ICD-10-CM | POA: Insufficient documentation

## 2022-05-21 LAB — ECHOCARDIOGRAM COMPLETE
Area-P 1/2: 2.68 cm2
MV VTI: 3.27 cm2
P 1/2 time: 358 msec
S' Lateral: 2.5 cm

## 2022-06-01 DIAGNOSIS — L738 Other specified follicular disorders: Secondary | ICD-10-CM | POA: Diagnosis not present

## 2022-06-01 DIAGNOSIS — R208 Other disturbances of skin sensation: Secondary | ICD-10-CM | POA: Diagnosis not present

## 2022-06-01 DIAGNOSIS — L918 Other hypertrophic disorders of the skin: Secondary | ICD-10-CM | POA: Diagnosis not present

## 2022-06-01 DIAGNOSIS — L304 Erythema intertrigo: Secondary | ICD-10-CM | POA: Diagnosis not present

## 2022-06-01 DIAGNOSIS — D1801 Hemangioma of skin and subcutaneous tissue: Secondary | ICD-10-CM | POA: Diagnosis not present

## 2022-06-01 DIAGNOSIS — L821 Other seborrheic keratosis: Secondary | ICD-10-CM | POA: Diagnosis not present

## 2022-06-01 DIAGNOSIS — I8391 Asymptomatic varicose veins of right lower extremity: Secondary | ICD-10-CM | POA: Diagnosis not present

## 2022-06-05 DIAGNOSIS — R7301 Impaired fasting glucose: Secondary | ICD-10-CM | POA: Diagnosis not present

## 2022-06-05 DIAGNOSIS — R7989 Other specified abnormal findings of blood chemistry: Secondary | ICD-10-CM | POA: Diagnosis not present

## 2022-06-05 DIAGNOSIS — E785 Hyperlipidemia, unspecified: Secondary | ICD-10-CM | POA: Diagnosis not present

## 2022-06-10 ENCOUNTER — Other Ambulatory Visit: Payer: Self-pay | Admitting: Internal Medicine

## 2022-06-10 DIAGNOSIS — R7301 Impaired fasting glucose: Secondary | ICD-10-CM | POA: Diagnosis not present

## 2022-06-10 DIAGNOSIS — J45909 Unspecified asthma, uncomplicated: Secondary | ICD-10-CM | POA: Diagnosis not present

## 2022-06-10 DIAGNOSIS — Z23 Encounter for immunization: Secondary | ICD-10-CM | POA: Diagnosis not present

## 2022-06-10 DIAGNOSIS — Z0000 Encounter for general adult medical examination without abnormal findings: Secondary | ICD-10-CM

## 2022-06-10 DIAGNOSIS — N281 Cyst of kidney, acquired: Secondary | ICD-10-CM | POA: Diagnosis not present

## 2022-06-10 DIAGNOSIS — M8580 Other specified disorders of bone density and structure, unspecified site: Secondary | ICD-10-CM | POA: Diagnosis not present

## 2022-06-10 DIAGNOSIS — M179 Osteoarthritis of knee, unspecified: Secondary | ICD-10-CM | POA: Diagnosis not present

## 2022-06-10 DIAGNOSIS — R82998 Other abnormal findings in urine: Secondary | ICD-10-CM | POA: Diagnosis not present

## 2022-06-10 DIAGNOSIS — R911 Solitary pulmonary nodule: Secondary | ICD-10-CM | POA: Diagnosis not present

## 2022-06-10 DIAGNOSIS — N1831 Chronic kidney disease, stage 3a: Secondary | ICD-10-CM | POA: Diagnosis not present

## 2022-06-10 DIAGNOSIS — I340 Nonrheumatic mitral (valve) insufficiency: Secondary | ICD-10-CM | POA: Diagnosis not present

## 2022-06-18 ENCOUNTER — Other Ambulatory Visit: Payer: Self-pay | Admitting: Cardiology

## 2022-06-22 ENCOUNTER — Ambulatory Visit (HOSPITAL_COMMUNITY)
Admission: RE | Admit: 2022-06-22 | Discharge: 2022-06-22 | Disposition: A | Discharge status: Home / Self Care | Payer: PPO | Source: Ambulatory Visit | Attending: Internal Medicine | Admitting: Internal Medicine

## 2022-06-22 ENCOUNTER — Inpatient Hospital Stay: Payer: PPO | Attending: Internal Medicine

## 2022-06-22 ENCOUNTER — Other Ambulatory Visit: Payer: Self-pay

## 2022-06-22 DIAGNOSIS — C3490 Malignant neoplasm of unspecified part of unspecified bronchus or lung: Secondary | ICD-10-CM

## 2022-06-22 DIAGNOSIS — C3432 Malignant neoplasm of lower lobe, left bronchus or lung: Secondary | ICD-10-CM | POA: Insufficient documentation

## 2022-06-22 DIAGNOSIS — R918 Other nonspecific abnormal finding of lung field: Secondary | ICD-10-CM | POA: Diagnosis not present

## 2022-06-22 LAB — CBC WITH DIFFERENTIAL (CANCER CENTER ONLY)
Abs Immature Granulocytes: 0.02 10*3/uL (ref 0.00–0.07)
Basophils Absolute: 0.1 10*3/uL (ref 0.0–0.1)
Basophils Relative: 1 %
Eosinophils Absolute: 0.2 10*3/uL (ref 0.0–0.5)
Eosinophils Relative: 4 %
HCT: 44.1 % (ref 36.0–46.0)
Hemoglobin: 14.7 g/dL (ref 12.0–15.0)
Immature Granulocytes: 0 %
Lymphocytes Relative: 22 %
Lymphs Abs: 1.3 10*3/uL (ref 0.7–4.0)
MCH: 30.4 pg (ref 26.0–34.0)
MCHC: 33.3 g/dL (ref 30.0–36.0)
MCV: 91.1 fL (ref 80.0–100.0)
Monocytes Absolute: 0.6 10*3/uL (ref 0.1–1.0)
Monocytes Relative: 10 %
Neutro Abs: 3.7 10*3/uL (ref 1.7–7.7)
Neutrophils Relative %: 63 %
Platelet Count: 367 10*3/uL (ref 150–400)
RBC: 4.84 MIL/uL (ref 3.87–5.11)
RDW: 12.3 % (ref 11.5–15.5)
WBC Count: 5.9 10*3/uL (ref 4.0–10.5)
nRBC: 0 % (ref 0.0–0.2)

## 2022-06-22 LAB — CMP (CANCER CENTER ONLY)
ALT: 18 U/L (ref 0–44)
AST: 16 U/L (ref 15–41)
Albumin: 4.2 g/dL (ref 3.5–5.0)
Alkaline Phosphatase: 66 U/L (ref 38–126)
Anion gap: 6 (ref 5–15)
BUN: 16 mg/dL (ref 8–23)
CO2: 29 mmol/L (ref 22–32)
Calcium: 9.6 mg/dL (ref 8.9–10.3)
Chloride: 105 mmol/L (ref 98–111)
Creatinine: 1.14 mg/dL — ABNORMAL HIGH (ref 0.44–1.00)
GFR, Estimated: 50 mL/min — ABNORMAL LOW (ref >60)
Glucose, Bld: 102 mg/dL — ABNORMAL HIGH (ref 70–99)
Potassium: 4.5 mmol/L (ref 3.5–5.1)
Sodium: 140 mmol/L (ref 135–145)
Total Bilirubin: 0.7 mg/dL (ref 0.3–1.2)
Total Protein: 7.2 g/dL (ref 6.5–8.1)

## 2022-06-22 MED ORDER — IOHEXOL 300 MG/ML  SOLN
75.0000 mL | Freq: Once | INTRAMUSCULAR | Status: AC | PRN
  Administered 2022-06-22: 75 mL via INTRAVENOUS

## 2022-06-24 ENCOUNTER — Inpatient Hospital Stay (HOSPITAL_BASED_OUTPATIENT_CLINIC_OR_DEPARTMENT_OTHER): Payer: PPO | Admitting: Internal Medicine

## 2022-06-24 VITALS — BP 137/86 | HR 79 | Temp 98.2°F | Resp 16 | Wt 168.8 lb

## 2022-06-24 DIAGNOSIS — C3490 Malignant neoplasm of unspecified part of unspecified bronchus or lung: Secondary | ICD-10-CM | POA: Diagnosis not present

## 2022-06-24 DIAGNOSIS — C3432 Malignant neoplasm of lower lobe, left bronchus or lung: Secondary | ICD-10-CM | POA: Diagnosis not present

## 2022-06-24 NOTE — Progress Notes (Signed)
[facility] [facility] [phone]) [phone]   Fax:([phone]  OFFICE PROGRESS NOTE  [name], MD [place] [address]  DIAGNOSIS: Stage IA (T1c, N0, M0) non-small cell lung cancer, adenocarcinoma diagnosed in May 2021.  She has multiple other pulmonary nodules that need close monitoring.  PRIOR THERAPY: Status post left lower lobectomy with lymph node dissection under the care of Dr. Roxan Hockey on 11/23/2019.  CURRENT THERAPY: Observation.  INTERVAL HISTORY: Anne Hogan 76 y.o. female returns to the clinic today for follow-up visit.  The patient is feeling fine today with no concerning complaints.  She denied having any current chest pain, shortness of breath, cough or hemoptysis.  She has no nausea, vomiting, diarrhea or constipation.  She has no headache or visual changes.  He has no significant weight loss or night sweats.  She is here today for evaluation with repeat CT scan of the chest for restaging of her disease.  MEDICAL HISTORY: Past Medical History:  Diagnosis Date   ADD (attention deficit disorder)    Arthritis    "right knee" (04/19/2013)   Asthma 04/03/2019   CHF (congestive heart failure) (HCC)    Chronic diastolic congestive heart failure ([facility])    Claustrophobia    Depression    Dyspnea    Dysrhythmia    Fracture of right tibial plateau 05/2016   GERD (gastroesophageal reflux disease)    Heart murmur    Incidental pulmonary nodule, greater than or equal to 44mm 06/06/2019   Needs f/u CT in 3 months   Migraine    "bad when I was in 5th or 6th grade; I can usually ward them off by resting my head now" (04/19/2013)   MVP (mitral valve prolapse)    S/P minimally-invasive mitral valve repair 08/03/2019   Complex valvuloplasty including artificial Gore-tex neochords x12 and Sorin Memo 4D ring annuloplasty, size 38 mm   S/P patent foramen ovale closure 08/03/2019   Sleep apnea    has not used mouth guard in 5 years no cpap used     Squamous carcinoma ?1999   "upper left groin" (04/19/2013)    ALLERGIES:  is allergic to penicillins, codeine, milk-related compounds, and morphine and related.  MEDICATIONS:  Current Outpatient Medications  Medication Sig Dispense Refill   acetaminophen (TYLENOL) 500 MG tablet Take 500-1,000 mg by mouth every 6 (six) hours as needed for moderate pain (pain).      Alum Hydroxide-Mag Carbonate (GAVISCON EXTRA STRENGTH) 160-105 MG CHEW Chew 1 tablet by mouth 2 (two) times daily as needed (indigestion).  (Patient not taking: Reported on 05/01/2022)     aspirin EC 81 MG EC tablet Take 1 tablet (81 mg total) by mouth daily.     Biotin 5000 MCG TABS Take 5,000 mcg by mouth daily. (Patient not taking: Reported on 05/01/2022)     BREO ELLIPTA 100-25 MCG/INH AEPB USE 1 PUFF DAILY AS DIRECTED 60 each 0   Calcium-Magnesium-Zinc 333-133-5 MG TABS Take 1-3 tablets by mouth daily at 12 noon. (Patient not taking: Reported on 05/01/2022)     doxycycline (VIBRAMYCIN) 100 MG capsule Take 1 capsule (100 mg total) one hour prior to any dental procedure 2 capsule 3   montelukast (SINGULAIR) 10 MG tablet Take 10 mg by mouth daily as needed (respiratory issues.).     Multiple Vitamins-Minerals (CENTRUM SILVER 50+WOMEN PO) Take 1 tablet by mouth daily.     No current facility-administered medications for this visit.  SURGICAL HISTORY:  Past Surgical History:  Procedure Laterality Date   CARDIAC CATHETERIZATION  05/26/2019   CHOLECYSTECTOMY  04/18/2013   CHOLECYSTECTOMY N/A 04/18/2013   Procedure: LAPAROSCOPIC CHOLECYSTECTOMY WITH INTRAOPERATIVE CHOLANGIOGRAM;  Surgeon: Ralene Ok, MD;  Location: [facility];  Service: General;  Laterality: N/A;   COLONOSCOPY W/ POLYPECTOMY     EYE SURGERY Bilateral FALL  2016   IOC FOR CATARACTS   FRACTURE SURGERY     INTERCOSTAL NERVE BLOCK Left 11/23/2019   Procedure: Intercostal Nerve Block;  Surgeon: Melrose Nakayama, MD;  Location: [facility];  Service: Thoracic;   Laterality: Left;   JOINT REPLACEMENT     KNEE ARTHROSCOPY Right 12/2007   "meniscus repair" (04/19/2013)   LYMPH NODE DISSECTION Left 11/23/2019   Procedure: Lymph Node Dissection;  Surgeon: Melrose Nakayama, MD;  Location: [facility];  Service: Thoracic;  Laterality: Left;   MITRAL VALVE REPAIR Right 08/03/2019   Procedure: MINIMALLY INVASIVE MITRAL VALVE REPAIR (MVR) using Memo 4D 35 MM Mitral Valve.;  Surgeon: Rexene Alberts, MD;  Location: [facility];  Service: Open Heart Surgery;  Laterality: Right;   REPAIR OF PATENT FORAMEN OVALE N/A 08/03/2019   Procedure: CLOSURE OF PATENT FORAMEN OVALE;  Surgeon: Rexene Alberts, MD;  Location: [facility];  Service: Open Heart Surgery;  Laterality: N/A;   RETINAL TEAR REPAIR CRYOTHERAPY Left ~ 1997   RIGHT/LEFT HEART CATH AND CORONARY ANGIOGRAPHY N/A 05/26/2019   Procedure: RIGHT/LEFT HEART CATH AND CORONARY ANGIOGRAPHY;  Surgeon: Jettie Booze, MD;  Location: [facility];  Service: Cardiovascular;  Laterality: N/A;   TEE WITHOUT CARDIOVERSION N/A 05/26/2019   Procedure: TRANSESOPHAGEAL ECHOCARDIOGRAM (TEE);  Surgeon: Josue Hector, MD;  Location[facility];  Service: Cardiovascular;  Laterality: N/A;   TEE WITHOUT CARDIOVERSION N/A 08/03/2019   Procedure: TRANSESOPHAGEAL ECHOCARDIOGRAM (TEE);  Surgeon: Rexene Alberts, MD;  Location: [facility];  Service: Open Heart Surgery;  Laterality: N/A;   TONSILLECTOMY  ~ [place] Right 09/21/2016   Procedure: RIGHT TOTAL KNEE ARTHROPLASTY;  Surgeon: Gaynelle Arabian, MD;  Location: [facility];  Service: Orthopedics;  Laterality: Right;  requests 40mins with abductor block   VARICOSE VEIN SURGERY Right 2000's   VASCULAR SURGERY     WRIST SURGERY Right 1975   "had a knot taken off" (04/19/2013)    REVIEW OF SYSTEMS:  A comprehensive review of systems was negative.   PHYSICAL EXAMINATION: General appearance: alert, cooperative, and no distress Head: Normocephalic, without obvious abnormality,  atraumatic Neck: no adenopathy, no JVD, supple, symmetrical, trachea midline, and thyroid not enlarged, symmetric, no tenderness/mass/nodules Lymph nodes: Cervical, supraclavicular, and axillary nodes normal. Resp: clear to auscultation bilaterally Back: symmetric, no curvature. ROM normal. No CVA tenderness. Cardio: regular rate and rhythm, S1, S2 normal, no murmur, click, rub or gallop GI: soft, non-tender; bowel sounds normal; no masses,  no organomegaly Extremities: extremities normal, atraumatic, no cyanosis or edema  ECOG PERFORMANCE STATUS: 1 - Symptomatic but completely ambulatory  Blood pressure 137/86, pulse 79, temperature 98.2 F (36.8 C), temperature source Oral, resp. rate 16, weight 168 lb 12.8 oz (76.6 kg), SpO2 100 %.  LABORATORY DATA: Lab Results  Component Value Date   WBC 5.9 06/22/2022   HGB 14.7 06/22/2022   HCT 44.1 06/22/2022   MCV 91.1 06/22/2022   PLT 367 06/22/2022      Chemistry      Component Value Date/Time   NA 140 06/22/2022 1110   NA 140 05/12/2019  1243   K 4.5 06/22/2022 1110   CL 105 06/22/2022 1110   CO2 29 06/22/2022 1110   BUN 16 06/22/2022 1110   BUN 14 05/12/2019 1243   CREATININE 1.14 (H) 06/22/2022 1110   CREATININE 1.05 (H) 07/19/2019 1206      Component Value Date/Time   CALCIUM 9.6 06/22/2022 1110   ALKPHOS 66 06/22/2022 1110   AST 16 06/22/2022 1110   ALT 18 06/22/2022 1110   BILITOT 0.7 06/22/2022 1110       RADIOGRAPHIC STUDIES: CT Chest W Contrast  Result Date: 06/22/2022 CLINICAL DATA:  Non-small cell lung cancer (NSCLC), staging. * Tracking Code: BO * EXAM: CT CHEST WITH CONTRAST TECHNIQUE: Multidetector CT imaging of the chest was performed during intravenous contrast administration. RADIATION DOSE REDUCTION: This exam was performed according to the departmental dose-optimization program which includes automated exposure control, adjustment of the mA and/or kV according to patient size and/or use of iterative  reconstruction technique. CONTRAST:  30mL OMNIPAQUE IOHEXOL 300 MG/ML  SOLN COMPARISON:  12/18/2021 chest CT. FINDINGS: Cardiovascular: Normal heart size. No significant pericardial effusion/thickening. Mitral valve prosthesis in place. Atherosclerotic nonaneurysmal thoracic aorta. Normal caliber pulmonary arteries. No central pulmonary emboli. Mediastinum/Nodes: No significant thyroid nodules. Unremarkable esophagus. No pathologically enlarged axillary, mediastinal or hilar lymph nodes. Lungs/Pleura: No pneumothorax. No pleural effusion. Status post left lower lobectomy. No acute consolidative airspace disease or lung masses. Several ground-glass pulmonary nodules scattered throughout both lungs, largest in the right lung measuring 1.3 cm in the posterior right lower lobe (series 5/image 84, previously 1.3 cm, stable, and largest in the left upper lobe measuring 1.0 cm (series 5/image 95), previously 1.0 cm, stable. Calcified 1.3 cm basilar right lower lobe granulomas unchanged. No new significant pulmonary nodules. Upper abdomen: Small hiatal hernia. Simple right liver cysts, largest 1.8 cm. Simple 5.0 cm lateral upper right renal cyst and subcentimeter posterior upper left renal cortical hypodense lesion that is too small to characterize, for which no follow-up imaging is recommended. Musculoskeletal: No aggressive appearing focal osseous lesions. Mild thoracic spondylosis. IMPRESSION: 1. No evidence of local tumor recurrence status post left lower lobectomy. 2. No evidence of new or progressive metastatic disease in the chest. 3. Several bilateral ground-glass pulmonary nodules, largest in the right lower lobe measuring 1.3 cm, all stable since 12/18/2021 chest CT, which warrant continued chest CT follow-up. 4. Small hiatal hernia. 5.  Aortic Atherosclerosis (ICD10-I70.0). Electronically Signed   By: Ilona Sorrel M.D.   On: 06/22/2022 20:49     ASSESSMENT AND PLAN: This is a very pleasant 76 years old white  female diagnosed with a stage IA (T1c, N0, M0) non-small cell lung cancer, adenocarcinoma status post left lower lobectomy with lymph node dissection in May 2021 under the care of Dr. Roxan Hockey.  She continues to have multiple bilateral groundglass opacities that need close monitoring. The patient is currently on observation and she is doing fine with no concerning complaints. The patient had repeat CT scan of the chest performed recently.  I personally and independently reviewed the scan images and discussed the result with the patient today. Her scan showed no concerning findings for disease recurrence or metastasis. I recommended for the patient to continue on observation with repeat CT scan of the chest in 6 months. She was advised to call immediately if she has any other concerning symptoms in the interval. The patient voices understanding of current disease status and treatment options and is in agreement with the current care plan.  All questions were answered. The patient knows to call the clinic with any problems, questions or concerns. We can certainly see the patient much sooner if necessary.  Disclaimer: This note was dictated with voice recognition software. Similar sounding words can inadvertently be transcribed and may not be corrected upon review.

## 2022-06-26 ENCOUNTER — Ambulatory Visit
Admission: RE | Admit: 2022-06-26 | Discharge: 2022-06-26 | Disposition: A | Discharge status: Home / Self Care | Payer: PPO | Source: Ambulatory Visit | Attending: Internal Medicine | Admitting: Internal Medicine

## 2022-06-26 DIAGNOSIS — Z0000 Encounter for general adult medical examination without abnormal findings: Secondary | ICD-10-CM

## 2022-06-26 DIAGNOSIS — Z1231 Encounter for screening mammogram for malignant neoplasm of breast: Secondary | ICD-10-CM | POA: Diagnosis not present

## 2022-08-13 DIAGNOSIS — Z0100 Encounter for examination of eyes and vision without abnormal findings: Secondary | ICD-10-CM | POA: Diagnosis not present

## 2022-09-20 DIAGNOSIS — U071 COVID-19: Secondary | ICD-10-CM | POA: Diagnosis not present

## 2022-10-19 DIAGNOSIS — M2011 Hallux valgus (acquired), right foot: Secondary | ICD-10-CM | POA: Diagnosis not present

## 2022-10-19 DIAGNOSIS — M2041 Other hammer toe(s) (acquired), right foot: Secondary | ICD-10-CM | POA: Diagnosis not present

## 2022-10-19 DIAGNOSIS — M2012 Hallux valgus (acquired), left foot: Secondary | ICD-10-CM | POA: Diagnosis not present

## 2022-10-19 DIAGNOSIS — M2042 Other hammer toe(s) (acquired), left foot: Secondary | ICD-10-CM | POA: Diagnosis not present

## 2022-11-12 DIAGNOSIS — K5900 Constipation, unspecified: Secondary | ICD-10-CM | POA: Diagnosis not present

## 2022-11-12 DIAGNOSIS — Z880 Allergy status to penicillin: Secondary | ICD-10-CM | POA: Diagnosis not present

## 2022-11-12 DIAGNOSIS — Z87891 Personal history of nicotine dependence: Secondary | ICD-10-CM | POA: Diagnosis not present

## 2022-11-12 DIAGNOSIS — Z825 Family history of asthma and other chronic lower respiratory diseases: Secondary | ICD-10-CM | POA: Diagnosis not present

## 2022-11-12 DIAGNOSIS — R32 Unspecified urinary incontinence: Secondary | ICD-10-CM | POA: Diagnosis not present

## 2022-11-12 DIAGNOSIS — K219 Gastro-esophageal reflux disease without esophagitis: Secondary | ICD-10-CM | POA: Diagnosis not present

## 2022-11-12 DIAGNOSIS — Z8249 Family history of ischemic heart disease and other diseases of the circulatory system: Secondary | ICD-10-CM | POA: Diagnosis not present

## 2022-11-12 DIAGNOSIS — Z809 Family history of malignant neoplasm, unspecified: Secondary | ICD-10-CM | POA: Diagnosis not present

## 2022-11-12 DIAGNOSIS — E669 Obesity, unspecified: Secondary | ICD-10-CM | POA: Diagnosis not present

## 2022-11-12 DIAGNOSIS — F325 Major depressive disorder, single episode, in full remission: Secondary | ICD-10-CM | POA: Diagnosis not present

## 2022-11-12 DIAGNOSIS — E559 Vitamin D deficiency, unspecified: Secondary | ICD-10-CM | POA: Diagnosis not present

## 2022-11-12 DIAGNOSIS — J449 Chronic obstructive pulmonary disease, unspecified: Secondary | ICD-10-CM | POA: Diagnosis not present

## 2022-11-18 DIAGNOSIS — R234 Changes in skin texture: Secondary | ICD-10-CM | POA: Diagnosis not present

## 2022-11-18 DIAGNOSIS — M2041 Other hammer toe(s) (acquired), right foot: Secondary | ICD-10-CM | POA: Diagnosis not present

## 2022-11-18 DIAGNOSIS — L84 Corns and callosities: Secondary | ICD-10-CM | POA: Diagnosis not present

## 2022-11-18 DIAGNOSIS — M2012 Hallux valgus (acquired), left foot: Secondary | ICD-10-CM | POA: Diagnosis not present

## 2022-11-18 DIAGNOSIS — M2011 Hallux valgus (acquired), right foot: Secondary | ICD-10-CM | POA: Diagnosis not present

## 2022-11-18 DIAGNOSIS — M2042 Other hammer toe(s) (acquired), left foot: Secondary | ICD-10-CM | POA: Diagnosis not present

## 2022-12-09 ENCOUNTER — Telehealth: Payer: Self-pay | Admitting: Cardiology

## 2022-12-09 ENCOUNTER — Telehealth: Payer: Self-pay | Admitting: Internal Medicine

## 2022-12-09 NOTE — Telephone Encounter (Signed)
Pt c/o medication issue:  1. Name of Medication:   doxycycline (VIBRAMYCIN) 100 MG capsule    2. How are you currently taking this medication (dosage and times per day)?   Take 1 capsule (100 mg total) one hour prior to any dental procedure    3. Are you having a reaction (difficulty breathing--STAT)? No  4. What is your medication issue? Pt would like a callback to discuss this medication and a medication her pcp has told her to take before dental visit as well called Azithromycine 500mg . Pt just wants to confirm it's okay to take and she would also like to know so that she can be sure to get the correct refill when it's time. Please advise

## 2022-12-09 NOTE — Telephone Encounter (Signed)
Called patient regarding upcoming June appointments, patient is notified.  

## 2022-12-09 NOTE — Telephone Encounter (Signed)
Pt wouldn't need both doxycycline and azithromycin prior to dental procedures, would just need one or the other. Either would be fine.

## 2022-12-10 DIAGNOSIS — N1831 Chronic kidney disease, stage 3a: Secondary | ICD-10-CM | POA: Diagnosis not present

## 2022-12-10 DIAGNOSIS — M8580 Other specified disorders of bone density and structure, unspecified site: Secondary | ICD-10-CM | POA: Diagnosis not present

## 2022-12-10 DIAGNOSIS — I340 Nonrheumatic mitral (valve) insufficiency: Secondary | ICD-10-CM | POA: Diagnosis not present

## 2022-12-10 DIAGNOSIS — J302 Other seasonal allergic rhinitis: Secondary | ICD-10-CM | POA: Diagnosis not present

## 2022-12-10 DIAGNOSIS — R7301 Impaired fasting glucose: Secondary | ICD-10-CM | POA: Diagnosis not present

## 2022-12-10 DIAGNOSIS — G4733 Obstructive sleep apnea (adult) (pediatric): Secondary | ICD-10-CM | POA: Diagnosis not present

## 2022-12-10 DIAGNOSIS — K219 Gastro-esophageal reflux disease without esophagitis: Secondary | ICD-10-CM | POA: Diagnosis not present

## 2022-12-10 DIAGNOSIS — E785 Hyperlipidemia, unspecified: Secondary | ICD-10-CM | POA: Diagnosis not present

## 2022-12-10 DIAGNOSIS — R911 Solitary pulmonary nodule: Secondary | ICD-10-CM | POA: Diagnosis not present

## 2022-12-10 DIAGNOSIS — J45909 Unspecified asthma, uncomplicated: Secondary | ICD-10-CM | POA: Diagnosis not present

## 2022-12-10 DIAGNOSIS — M179 Osteoarthritis of knee, unspecified: Secondary | ICD-10-CM | POA: Diagnosis not present

## 2022-12-10 DIAGNOSIS — F39 Unspecified mood [affective] disorder: Secondary | ICD-10-CM | POA: Diagnosis not present

## 2022-12-10 NOTE — Telephone Encounter (Signed)
Patient aware she does not need both doxycycline and azithromycin. She verbalized understanding and stated she will only take one.

## 2022-12-10 NOTE — Telephone Encounter (Signed)
Patient called to follow-up on medication query.

## 2022-12-22 ENCOUNTER — Inpatient Hospital Stay: Payer: Medicare HMO | Attending: Physician Assistant

## 2022-12-22 ENCOUNTER — Ambulatory Visit (HOSPITAL_COMMUNITY)
Admission: RE | Admit: 2022-12-22 | Discharge: 2022-12-22 | Disposition: A | Discharge status: Home / Self Care | Payer: Medicare HMO | Source: Ambulatory Visit | Attending: Internal Medicine | Admitting: Internal Medicine

## 2022-12-22 DIAGNOSIS — C3490 Malignant neoplasm of unspecified part of unspecified bronchus or lung: Secondary | ICD-10-CM

## 2022-12-22 DIAGNOSIS — Z85118 Personal history of other malignant neoplasm of bronchus and lung: Secondary | ICD-10-CM | POA: Diagnosis not present

## 2022-12-22 DIAGNOSIS — Z08 Encounter for follow-up examination after completed treatment for malignant neoplasm: Secondary | ICD-10-CM | POA: Diagnosis not present

## 2022-12-22 DIAGNOSIS — R911 Solitary pulmonary nodule: Secondary | ICD-10-CM | POA: Diagnosis not present

## 2022-12-22 LAB — CBC WITH DIFFERENTIAL (CANCER CENTER ONLY)
Abs Immature Granulocytes: 0.02 10*3/uL (ref 0.00–0.07)
Basophils Absolute: 0.1 10*3/uL (ref 0.0–0.1)
Basophils Relative: 1 %
Eosinophils Absolute: 0.3 10*3/uL (ref 0.0–0.5)
Eosinophils Relative: 5 %
HCT: 41.9 % (ref 36.0–46.0)
Hemoglobin: 14.4 g/dL (ref 12.0–15.0)
Immature Granulocytes: 0 %
Lymphocytes Relative: 23 %
Lymphs Abs: 1.4 10*3/uL (ref 0.7–4.0)
MCH: 30.4 pg (ref 26.0–34.0)
MCHC: 34.4 g/dL (ref 30.0–36.0)
MCV: 88.6 fL (ref 80.0–100.0)
Monocytes Absolute: 0.6 10*3/uL (ref 0.1–1.0)
Monocytes Relative: 11 %
Neutro Abs: 3.6 10*3/uL (ref 1.7–7.7)
Neutrophils Relative %: 60 %
Platelet Count: 334 10*3/uL (ref 150–400)
RBC: 4.73 MIL/uL (ref 3.87–5.11)
RDW: 12.5 % (ref 11.5–15.5)
WBC Count: 5.9 10*3/uL (ref 4.0–10.5)
nRBC: 0 % (ref 0.0–0.2)

## 2022-12-22 LAB — CMP (CANCER CENTER ONLY)
ALT: 20 U/L (ref 0–44)
AST: 18 U/L (ref 15–41)
Albumin: 4.1 g/dL (ref 3.5–5.0)
Alkaline Phosphatase: 67 U/L (ref 38–126)
Anion gap: 7 (ref 5–15)
BUN: 20 mg/dL (ref 8–23)
CO2: 26 mmol/L (ref 22–32)
Calcium: 9.7 mg/dL (ref 8.9–10.3)
Chloride: 107 mmol/L (ref 98–111)
Creatinine: 1.12 mg/dL — ABNORMAL HIGH (ref 0.44–1.00)
GFR, Estimated: 51 mL/min — ABNORMAL LOW (ref >60)
Glucose, Bld: 99 mg/dL (ref 70–99)
Potassium: 4.5 mmol/L (ref 3.5–5.1)
Sodium: 140 mmol/L (ref 135–145)
Total Bilirubin: 0.7 mg/dL (ref 0.3–1.2)
Total Protein: 7.1 g/dL (ref 6.5–8.1)

## 2022-12-22 MED ORDER — SODIUM CHLORIDE (PF) 0.9 % IJ SOLN
INTRAMUSCULAR | Status: AC
  Filled 2022-12-22: qty 50

## 2022-12-22 MED ORDER — IOHEXOL 300 MG/ML  SOLN
75.0000 mL | Freq: Once | INTRAMUSCULAR | Status: AC | PRN
  Administered 2022-12-22: 75 mL via INTRAVENOUS

## 2022-12-24 ENCOUNTER — Ambulatory Visit: Payer: PPO | Admitting: Internal Medicine

## 2022-12-24 NOTE — Progress Notes (Signed)
[facility] OFFICE PROGRESS NOTE  Chilton Greathouse, MD [address] [place] [address]  DIAGNOSIS: Stage IA (T1c, N0, M0) non-small cell lung cancer, adenocarcinoma diagnosed in May 2021.  She has multiple other pulmonary nodules that need close monitoring.   PRIOR THERAPY: Status post left lower lobectomy with lymph node dissection under the care of Dr. Dorris Fetch on 11/23/2019.   CURRENT THERAPY: Observation   INTERVAL HISTORY: Anne Hogan 77 y.o. female returns to the clinic today for a follow up visit.  The patient is followed for history of stage I non-small cell lung cancer, adenocarcinoma. She also has other pulmonary nodules that we are monitoring closely.  She was last seen in clinic by Dr. Arbutus Ped on 06/24/2022.  She denies any major changes in her health since she was last seen. She has some sneezing and nasal congestion since she does a lot of gardening. She denies any fever, chills, night sweats, or unexplained weight loss.  Denies any chest pain, shortness of breath, cough, or hemoptysis.  Denies any nausea, vomiting, diarrhea, or constipation.  Denies any headache or visual changes except she had a mild headache yesterday which resolved with tylenol. She recently had a restaging CT scan performed.  She is here today for evaluation and to review her scan results.  MEDICAL HISTORY: Past Medical History:  Diagnosis Date   ADD (attention deficit disorder)    Arthritis    "right knee" (04/19/2013)   Asthma 04/03/2019   CHF (congestive heart failure) (HCC)    Chronic diastolic congestive heart failure (HCC)    Claustrophobia    Depression    Dyspnea    Dysrhythmia    Fracture of right tibial plateau 05/2016   GERD (gastroesophageal reflux disease)    Heart murmur    Incidental pulmonary nodule, greater than or equal to 8mm 06/06/2019   Needs f/u CT in 3 months   Migraine    "bad when I was in 5th or 6th grade; I can usually ward them off by resting my  head now" (04/19/2013)   MVP (mitral valve prolapse)    S/P minimally-invasive mitral valve repair 08/03/2019   Complex valvuloplasty including artificial Gore-tex neochords x12 and Sorin Memo 4D ring annuloplasty, size 38 mm   S/P patent foramen ovale closure 08/03/2019   Sleep apnea    has not used mouth guard in 5 years no cpap used    Squamous carcinoma ?1999   "upper left groin" (04/19/2013)    ALLERGIES:  is allergic to penicillins, codeine, milk-related compounds, and morphine and codeine.  MEDICATIONS:  Current Outpatient Medications  Medication Sig Dispense Refill   acetaminophen (TYLENOL) 500 MG tablet Take 500-1,000 mg by mouth every 6 (six) hours as needed for moderate pain (pain).      Alum Hydroxide-Mag Carbonate (GAVISCON EXTRA STRENGTH) 160-105 MG CHEW Chew 1 tablet by mouth 2 (two) times daily as needed (indigestion).  (Patient not taking: Reported on 05/01/2022)     aspirin EC 81 MG EC tablet Take 1 tablet (81 mg total) by mouth daily.     Biotin 5000 MCG TABS Take 5,000 mcg by mouth daily. (Patient not taking: Reported on 05/01/2022)     BREO ELLIPTA 100-25 MCG/INH AEPB USE 1 PUFF DAILY AS DIRECTED 60 each 0   Calcium-Magnesium-Zinc 333-133-5 MG TABS Take 1-3 tablets by mouth daily at 12 noon. (Patient not taking: Reported on 05/01/2022)     doxycycline (VIBRAMYCIN) 100 MG capsule Take 1 capsule (100 mg  total) one hour prior to any dental procedure 2 capsule 3   montelukast (SINGULAIR) 10 MG tablet Take 10 mg by mouth daily as needed (respiratory issues.).     Multiple Vitamins-Minerals (CENTRUM SILVER 50+WOMEN PO) Take 1 tablet by mouth daily.     No current facility-administered medications for this visit.    SURGICAL HISTORY:  Past Surgical History:  Procedure Laterality Date   CARDIAC CATHETERIZATION  05/26/2019   CHOLECYSTECTOMY  04/18/2013   CHOLECYSTECTOMY N/A 04/18/2013   Procedure: LAPAROSCOPIC CHOLECYSTECTOMY WITH INTRAOPERATIVE CHOLANGIOGRAM;  Surgeon:  Axel Filler, MD;  Location: [facility] OR;  Service: General;  Laterality: N/A;   COLONOSCOPY W/ POLYPECTOMY     EYE SURGERY Bilateral FALL  2016   IOC FOR CATARACTS   FRACTURE SURGERY     INTERCOSTAL NERVE BLOCK Left 11/23/2019   Procedure: Intercostal Nerve Block;  Surgeon: Loreli Slot, MD;  Location: [facility] OR;  Service: Thoracic;  Laterality: Left;   JOINT REPLACEMENT     KNEE ARTHROSCOPY Right 12/2007   "meniscus repair" (04/19/2013)   LYMPH NODE DISSECTION Left 11/23/2019   Procedure: Lymph Node Dissection;  Surgeon: Loreli Slot, MD;  Location: [facility] OR;  Service: Thoracic;  Laterality: Left;   MITRAL VALVE REPAIR Right 08/03/2019   Procedure: MINIMALLY INVASIVE MITRAL VALVE REPAIR (MVR) using Memo 4D 38 MM Mitral Valve.;  Surgeon: Purcell Nails, MD;  Location: [facility] OR;  Service: Open Heart Surgery;  Laterality: Right;   REPAIR OF PATENT FORAMEN OVALE N/A 08/03/2019   Procedure: CLOSURE OF PATENT FORAMEN OVALE;  Surgeon: Purcell Nails, MD;  Location: [facility] OR;  Service: Open Heart Surgery;  Laterality: N/A;   RETINAL TEAR REPAIR CRYOTHERAPY Left ~ 1997   RIGHT/LEFT HEART CATH AND CORONARY ANGIOGRAPHY N/A 05/26/2019   Procedure: RIGHT/LEFT HEART CATH AND CORONARY ANGIOGRAPHY;  Surgeon: Corky Crafts, MD;  Location: [facility] INVASIVE CV LAB;  Service: Cardiovascular;  Laterality: N/A;   TEE WITHOUT CARDIOVERSION N/A 05/26/2019   Procedure: TRANSESOPHAGEAL ECHOCARDIOGRAM (TEE);  Surgeon: Wendall Stade, MD;  Location: [facility] ENDOSCOPY;  Service: Cardiovascular;  Laterality: N/A;   TEE WITHOUT CARDIOVERSION N/A 08/03/2019   Procedure: TRANSESOPHAGEAL ECHOCARDIOGRAM (TEE);  Surgeon: Purcell Nails, MD;  Location: [facility] OR;  Service: Open Heart Surgery;  Laterality: N/A;   TONSILLECTOMY  ~ 1952   TOTAL KNEE ARTHROPLASTY Right 09/21/2016   Procedure: RIGHT TOTAL KNEE ARTHROPLASTY;  Surgeon: Ollen Gross, MD;  Location: [facility] ORS;  Service: Orthopedics;  Laterality: Right;  requests with  abductor block   VARICOSE VEIN SURGERY Right 2000's   VASCULAR SURGERY     WRIST SURGERY Right 1975   "had a knot taken off" (04/19/2013)    REVIEW OF SYSTEMS:   Review of Systems  Constitutional: Negative for appetite change, chills, fatigue, fever and unexpected weight change.  HENT: Positive for nasal congestion/sneezing. Negative for mouth sores, nosebleeds, sore throat and trouble swallowing.   Eyes: Negative for eye problems and icterus.  Respiratory: Negative for cough, hemoptysis, shortness of breath and wheezing.   Cardiovascular: Negative for chest pain and leg swelling.  Gastrointestinal: Negative for abdominal pain, constipation, diarrhea, nausea and vomiting.  Genitourinary: Negative for bladder incontinence, difficulty urinating, dysuria, frequency and hematuria.   Musculoskeletal: Negative for back pain, gait problem, neck pain and neck stiffness.  Skin: Negative for itching and rash.  Neurological: Negative for dizziness, extremity weakness, gait problem, headaches, light-headedness and seizures.  Hematological: Negative for adenopathy. Does not bruise/bleed easily.  Psychiatric/Behavioral: Negative for  confusion, depression and sleep disturbance. The patient is not nervous/anxious.     PHYSICAL EXAMINATION:  Blood pressure 127/76, pulse 78, temperature 98.3 F (36.8 C), temperature source Oral, resp. rate 16, weight 169 lb 1.6 oz (76.7 kg), SpO2 97 %.  ECOG PERFORMANCE STATUS: 0  Physical Exam  Constitutional: Oriented to person, place, and time and well-developed, well-nourished, and in no distress.  HENT:  Head: Normocephalic and atraumatic.  Mouth/Throat: Oropharynx is clear and moist. No oropharyngeal exudate.  Eyes: Conjunctivae are normal. Right eye exhibits no discharge. Left eye exhibits no discharge. No scleral icterus.  Neck: Normal range of motion. Neck supple.  Cardiovascular: Normal rate, regular rhythm, normal heart sounds and intact distal pulses.    Pulmonary/Chest: Effort normal and breath sounds normal. No respiratory distress. No wheezes. No rales.  Abdominal: Soft. Bowel sounds are normal. Exhibits no distension and no mass. There is no tenderness.  Musculoskeletal: Normal range of motion. Exhibits no edema.  Lymphadenopathy:    No cervical adenopathy.  Neurological: Alert and oriented to person, place, and time. Exhibits normal muscle tone. Gait normal. Coordination normal.  Skin: Skin is warm and dry. No rash noted. Not diaphoretic. No erythema. No pallor.  Psychiatric: Mood, memory and judgment normal.  Vitals reviewed.  LABORATORY DATA: Lab Results  Component Value Date   WBC 5.9 12/22/2022   HGB 14.4 12/22/2022   HCT 41.9 12/22/2022   MCV 88.6 12/22/2022   PLT 334 12/22/2022      Chemistry      Component Value Date/Time   NA 140 12/22/2022 1338   NA 140 05/12/2019 1243   K 4.5 12/22/2022 1338   CL 107 12/22/2022 1338   CO2 26 12/22/2022 1338   BUN 20 12/22/2022 1338   BUN 14 05/12/2019 1243   CREATININE 1.12 (H) 12/22/2022 1338   CREATININE 1.05 (H) 07/19/2019 1206      Component Value Date/Time   CALCIUM 9.7 12/22/2022 1338   ALKPHOS 67 12/22/2022 1338   AST 18 12/22/2022 1338   ALT 20 12/22/2022 1338   BILITOT 0.7 12/22/2022 1338       RADIOGRAPHIC STUDIES:  CT Chest W Contrast  Result Date: 12/28/2022 CLINICAL DATA:  Non-small cell lung cancer staging. * Tracking Code: BO * EXAM: CT CHEST WITH CONTRAST TECHNIQUE: Multidetector CT imaging of the chest was performed during intravenous contrast administration. RADIATION DOSE REDUCTION: This exam was performed according to the departmental dose-optimization program which includes automated exposure control, adjustment of the mA and/or kV according to patient size and/or use of iterative reconstruction technique. CONTRAST:  75mL OMNIPAQUE IOHEXOL 300 MG/ML  SOLN COMPARISON:  06/22/2022 FINDINGS: Cardiovascular: No significant vascular findings. Normal  heart size. No pericardial effusion. Mediastinum/Nodes: No axillary or supraclavicular adenopathy. No mediastinal or hilar adenopathy. No pericardial fluid. Esophagus normal. Lungs/Pleura: Several subtle ground-glass nodules in the LEFT and RIGHT upper lobe measures 10 mm or less (image 26/series 7). No new pulmonary nodules. postsurgical change consistent with LEFT lower lobectomy. No evidence local recurrence. Benign calcifications over the RIGHT hemidiaphragm. Upper Abdomen: Limited view of the liver, kidneys, pancreas are unremarkable. Normal adrenal glands. Benign cysts in the kidneys and liver. Musculoskeletal: No aggressive osseous lesion. IMPRESSION: 1. Postsurgical change consistent with LEFT lower lobectomy. No evidence local recurrence. 2. No evidence of metastatic disease in the chest. 3. Several small ground-glass nodules in the LEFT and RIGHT upper lobe are unchanged. Electronically Signed   By: Genevive Bi M.D.   On: 12/28/2022 13:33  ASSESSMENT/PLAN:  This is a very pleasant 77 year old Caucasian female diagnosed with stage Ia (T1c, N0, M0) non-small cell lung cancer, adenocarcinoma.  She also has multiple other bilateral groundglass opacities that we are monitoring closely.  She is status post a left lower lobectomy and lymph node dissection in May 2021 by Dr. Dorris Fetch.  She has been on observation since that time and feeling well.  She recently had a restaging CT scan.  She was seen with Dr. Arbutus Ped today.  Dr. Arbutus Ped personally and independently reviewed the scan and discussed the results with the patient today.  The scan showed no evidence of disease progression.   Dr. Arbutus Ped recommends she continue on observation with a repeat CT scan in 1 year.   I have ordered her restaging CT scan in 1 year.   I advised her to take Claritin or zyrtec p.o. daily for her allergies.   The patient was advised to call immediately if she has any concerning symptoms in the  interval. The patient voices understanding of current disease status and treatment options and is in agreement with the current care plan. All questions were answered. The patient knows to call the clinic with any problems, questions or concerns. We can certainly see the patient much sooner if necessary   Orders Placed This Encounter  Procedures   CT Chest W Contrast    Standing Status:   Future    Standing Expiration Date:   12/28/2023    Order Specific Question:   If indicated for the ordered procedure, I authorize the administration of contrast media per Radiology protocol    Answer:   Yes    Order Specific Question:   Does the patient have a contrast media/X-ray dye allergy?    Answer:   No    Order Specific Question:   Preferred imaging location?    Answer:   [facility]   CBC with Differential (Cancer Center Only)    Standing Status:   Future    Standing Expiration Date:   12/28/2023   CMP (Cancer Center only)    Standing Status:   Future    Standing Expiration Date:   12/28/2023    Arnold Goldy Jaren Vanetten, PA-C 12/28/22  ADDENDUM: Hematology/Oncology Attending: I had a face-to-face encounter with the patient today.  I reviewed her record, lab, scan and recommended her care plan.  This is a very pleasant 77 years old white female with a stage Ia non-small cell lung cancer diagnosed in May 2021 and she also has multiple other pulmonary nodules.  She is status post left lower lobectomy with lymph node dissection on Nov 23, 2019 under the care of Dr. Dorris Fetch and she has been in observation since that time. The patient is feeling fine today with no concerning complaints. She had repeat CT scan of the chest performed recently.  I personally and independently reviewed the scan and discussed the result with the patient today. Her scan showed no concerning findings for disease recurrence or metastasis. I recommended for her to continue on observation with repeat CT scan of  the chest in 1 year. The patient was advised to call immediately if she has any other concerning symptoms in the interval. Disclaimer: This note was dictated with voice recognition software. Similar sounding words can inadvertently be transcribed and may be missed upon review. Lajuana Matte, MD

## 2022-12-28 ENCOUNTER — Other Ambulatory Visit: Payer: Self-pay

## 2022-12-28 ENCOUNTER — Inpatient Hospital Stay (HOSPITAL_BASED_OUTPATIENT_CLINIC_OR_DEPARTMENT_OTHER): Payer: Medicare HMO | Admitting: Physician Assistant

## 2022-12-28 VITALS — BP 127/76 | HR 78 | Temp 98.3°F | Resp 16 | Wt 169.1 lb

## 2022-12-28 DIAGNOSIS — C3492 Malignant neoplasm of unspecified part of left bronchus or lung: Secondary | ICD-10-CM | POA: Diagnosis not present

## 2022-12-28 DIAGNOSIS — Z08 Encounter for follow-up examination after completed treatment for malignant neoplasm: Secondary | ICD-10-CM | POA: Diagnosis not present

## 2022-12-28 DIAGNOSIS — Z85118 Personal history of other malignant neoplasm of bronchus and lung: Secondary | ICD-10-CM | POA: Diagnosis not present

## 2023-01-18 DIAGNOSIS — M5412 Radiculopathy, cervical region: Secondary | ICD-10-CM | POA: Diagnosis not present

## 2023-01-18 DIAGNOSIS — M9902 Segmental and somatic dysfunction of thoracic region: Secondary | ICD-10-CM | POA: Diagnosis not present

## 2023-01-18 DIAGNOSIS — M62830 Muscle spasm of back: Secondary | ICD-10-CM | POA: Diagnosis not present

## 2023-01-18 DIAGNOSIS — M9901 Segmental and somatic dysfunction of cervical region: Secondary | ICD-10-CM | POA: Diagnosis not present

## 2023-01-19 DIAGNOSIS — M9901 Segmental and somatic dysfunction of cervical region: Secondary | ICD-10-CM | POA: Diagnosis not present

## 2023-01-19 DIAGNOSIS — M9902 Segmental and somatic dysfunction of thoracic region: Secondary | ICD-10-CM | POA: Diagnosis not present

## 2023-01-19 DIAGNOSIS — M62830 Muscle spasm of back: Secondary | ICD-10-CM | POA: Diagnosis not present

## 2023-01-20 DIAGNOSIS — M9902 Segmental and somatic dysfunction of thoracic region: Secondary | ICD-10-CM | POA: Diagnosis not present

## 2023-01-20 DIAGNOSIS — M9901 Segmental and somatic dysfunction of cervical region: Secondary | ICD-10-CM | POA: Diagnosis not present

## 2023-01-20 DIAGNOSIS — M62830 Muscle spasm of back: Secondary | ICD-10-CM | POA: Diagnosis not present

## 2023-01-25 DIAGNOSIS — M9902 Segmental and somatic dysfunction of thoracic region: Secondary | ICD-10-CM | POA: Diagnosis not present

## 2023-01-25 DIAGNOSIS — M62830 Muscle spasm of back: Secondary | ICD-10-CM | POA: Diagnosis not present

## 2023-01-25 DIAGNOSIS — M9901 Segmental and somatic dysfunction of cervical region: Secondary | ICD-10-CM | POA: Diagnosis not present

## 2023-01-26 DIAGNOSIS — M9902 Segmental and somatic dysfunction of thoracic region: Secondary | ICD-10-CM | POA: Diagnosis not present

## 2023-01-26 DIAGNOSIS — M9901 Segmental and somatic dysfunction of cervical region: Secondary | ICD-10-CM | POA: Diagnosis not present

## 2023-01-26 DIAGNOSIS — M62830 Muscle spasm of back: Secondary | ICD-10-CM | POA: Diagnosis not present

## 2023-01-28 DIAGNOSIS — M9902 Segmental and somatic dysfunction of thoracic region: Secondary | ICD-10-CM | POA: Diagnosis not present

## 2023-01-28 DIAGNOSIS — M62830 Muscle spasm of back: Secondary | ICD-10-CM | POA: Diagnosis not present

## 2023-01-28 DIAGNOSIS — M9901 Segmental and somatic dysfunction of cervical region: Secondary | ICD-10-CM | POA: Diagnosis not present

## 2023-01-28 DIAGNOSIS — M5412 Radiculopathy, cervical region: Secondary | ICD-10-CM | POA: Diagnosis not present

## 2023-02-02 DIAGNOSIS — M9902 Segmental and somatic dysfunction of thoracic region: Secondary | ICD-10-CM | POA: Diagnosis not present

## 2023-02-02 DIAGNOSIS — M9901 Segmental and somatic dysfunction of cervical region: Secondary | ICD-10-CM | POA: Diagnosis not present

## 2023-02-02 DIAGNOSIS — M62830 Muscle spasm of back: Secondary | ICD-10-CM | POA: Diagnosis not present

## 2023-02-03 DIAGNOSIS — M9902 Segmental and somatic dysfunction of thoracic region: Secondary | ICD-10-CM | POA: Diagnosis not present

## 2023-02-03 DIAGNOSIS — M9901 Segmental and somatic dysfunction of cervical region: Secondary | ICD-10-CM | POA: Diagnosis not present

## 2023-02-03 DIAGNOSIS — M62830 Muscle spasm of back: Secondary | ICD-10-CM | POA: Diagnosis not present

## 2023-02-05 DIAGNOSIS — M62830 Muscle spasm of back: Secondary | ICD-10-CM | POA: Diagnosis not present

## 2023-02-05 DIAGNOSIS — M9902 Segmental and somatic dysfunction of thoracic region: Secondary | ICD-10-CM | POA: Diagnosis not present

## 2023-02-05 DIAGNOSIS — M9901 Segmental and somatic dysfunction of cervical region: Secondary | ICD-10-CM | POA: Diagnosis not present

## 2023-02-09 DIAGNOSIS — M9901 Segmental and somatic dysfunction of cervical region: Secondary | ICD-10-CM | POA: Diagnosis not present

## 2023-02-09 DIAGNOSIS — M9902 Segmental and somatic dysfunction of thoracic region: Secondary | ICD-10-CM | POA: Diagnosis not present

## 2023-02-09 DIAGNOSIS — M62830 Muscle spasm of back: Secondary | ICD-10-CM | POA: Diagnosis not present

## 2023-02-10 DIAGNOSIS — M9901 Segmental and somatic dysfunction of cervical region: Secondary | ICD-10-CM | POA: Diagnosis not present

## 2023-02-10 DIAGNOSIS — M9902 Segmental and somatic dysfunction of thoracic region: Secondary | ICD-10-CM | POA: Diagnosis not present

## 2023-02-10 DIAGNOSIS — M62830 Muscle spasm of back: Secondary | ICD-10-CM | POA: Diagnosis not present

## 2023-02-12 DIAGNOSIS — M9902 Segmental and somatic dysfunction of thoracic region: Secondary | ICD-10-CM | POA: Diagnosis not present

## 2023-02-12 DIAGNOSIS — M62830 Muscle spasm of back: Secondary | ICD-10-CM | POA: Diagnosis not present

## 2023-02-12 DIAGNOSIS — M9901 Segmental and somatic dysfunction of cervical region: Secondary | ICD-10-CM | POA: Diagnosis not present

## 2023-02-16 DIAGNOSIS — M62830 Muscle spasm of back: Secondary | ICD-10-CM | POA: Diagnosis not present

## 2023-02-16 DIAGNOSIS — M9901 Segmental and somatic dysfunction of cervical region: Secondary | ICD-10-CM | POA: Diagnosis not present

## 2023-02-16 DIAGNOSIS — M9902 Segmental and somatic dysfunction of thoracic region: Secondary | ICD-10-CM | POA: Diagnosis not present

## 2023-02-16 DIAGNOSIS — M5412 Radiculopathy, cervical region: Secondary | ICD-10-CM | POA: Diagnosis not present

## 2023-02-18 DIAGNOSIS — M2042 Other hammer toe(s) (acquired), left foot: Secondary | ICD-10-CM | POA: Diagnosis not present

## 2023-02-18 DIAGNOSIS — R234 Changes in skin texture: Secondary | ICD-10-CM | POA: Diagnosis not present

## 2023-02-18 DIAGNOSIS — L84 Corns and callosities: Secondary | ICD-10-CM | POA: Diagnosis not present

## 2023-02-18 DIAGNOSIS — M2041 Other hammer toe(s) (acquired), right foot: Secondary | ICD-10-CM | POA: Diagnosis not present

## 2023-02-18 DIAGNOSIS — M2011 Hallux valgus (acquired), right foot: Secondary | ICD-10-CM | POA: Diagnosis not present

## 2023-02-18 DIAGNOSIS — M2012 Hallux valgus (acquired), left foot: Secondary | ICD-10-CM | POA: Diagnosis not present

## 2023-03-23 DIAGNOSIS — H26493 Other secondary cataract, bilateral: Secondary | ICD-10-CM | POA: Diagnosis not present

## 2023-03-23 DIAGNOSIS — H31002 Unspecified chorioretinal scars, left eye: Secondary | ICD-10-CM | POA: Diagnosis not present

## 2023-03-23 DIAGNOSIS — Z961 Presence of intraocular lens: Secondary | ICD-10-CM | POA: Diagnosis not present

## 2023-03-23 DIAGNOSIS — H524 Presbyopia: Secondary | ICD-10-CM | POA: Diagnosis not present

## 2023-05-13 ENCOUNTER — Other Ambulatory Visit: Payer: Self-pay | Admitting: Internal Medicine

## 2023-05-13 DIAGNOSIS — Z0000 Encounter for general adult medical examination without abnormal findings: Secondary | ICD-10-CM | POA: Diagnosis not present

## 2023-05-13 DIAGNOSIS — R911 Solitary pulmonary nodule: Secondary | ICD-10-CM | POA: Diagnosis not present

## 2023-05-13 DIAGNOSIS — Z1231 Encounter for screening mammogram for malignant neoplasm of breast: Secondary | ICD-10-CM

## 2023-05-13 DIAGNOSIS — R5381 Other malaise: Secondary | ICD-10-CM | POA: Diagnosis not present

## 2023-05-13 DIAGNOSIS — N183 Chronic kidney disease, stage 3 unspecified: Secondary | ICD-10-CM | POA: Diagnosis not present

## 2023-05-13 DIAGNOSIS — Z1389 Encounter for screening for other disorder: Secondary | ICD-10-CM | POA: Diagnosis not present

## 2023-06-02 DIAGNOSIS — Z961 Presence of intraocular lens: Secondary | ICD-10-CM | POA: Diagnosis not present

## 2023-06-15 DIAGNOSIS — Z1212 Encounter for screening for malignant neoplasm of rectum: Secondary | ICD-10-CM | POA: Diagnosis not present

## 2023-06-15 DIAGNOSIS — E785 Hyperlipidemia, unspecified: Secondary | ICD-10-CM | POA: Diagnosis not present

## 2023-06-25 DIAGNOSIS — J302 Other seasonal allergic rhinitis: Secondary | ICD-10-CM | POA: Diagnosis not present

## 2023-06-25 DIAGNOSIS — F39 Unspecified mood [affective] disorder: Secondary | ICD-10-CM | POA: Diagnosis not present

## 2023-06-25 DIAGNOSIS — Z0000 Encounter for general adult medical examination without abnormal findings: Secondary | ICD-10-CM | POA: Diagnosis not present

## 2023-06-25 DIAGNOSIS — K219 Gastro-esophageal reflux disease without esophagitis: Secondary | ICD-10-CM | POA: Diagnosis not present

## 2023-06-25 DIAGNOSIS — E785 Hyperlipidemia, unspecified: Secondary | ICD-10-CM | POA: Diagnosis not present

## 2023-06-25 DIAGNOSIS — M8580 Other specified disorders of bone density and structure, unspecified site: Secondary | ICD-10-CM | POA: Diagnosis not present

## 2023-06-25 DIAGNOSIS — C3492 Malignant neoplasm of unspecified part of left bronchus or lung: Secondary | ICD-10-CM | POA: Diagnosis not present

## 2023-06-25 DIAGNOSIS — Z1339 Encounter for screening examination for other mental health and behavioral disorders: Secondary | ICD-10-CM | POA: Diagnosis not present

## 2023-06-25 DIAGNOSIS — Z1331 Encounter for screening for depression: Secondary | ICD-10-CM | POA: Diagnosis not present

## 2023-06-25 DIAGNOSIS — R82998 Other abnormal findings in urine: Secondary | ICD-10-CM | POA: Diagnosis not present

## 2023-06-25 DIAGNOSIS — J45909 Unspecified asthma, uncomplicated: Secondary | ICD-10-CM | POA: Diagnosis not present

## 2023-06-25 DIAGNOSIS — R7301 Impaired fasting glucose: Secondary | ICD-10-CM | POA: Diagnosis not present

## 2023-06-25 DIAGNOSIS — N1831 Chronic kidney disease, stage 3a: Secondary | ICD-10-CM | POA: Diagnosis not present

## 2023-06-25 DIAGNOSIS — I8393 Asymptomatic varicose veins of bilateral lower extremities: Secondary | ICD-10-CM | POA: Diagnosis not present

## 2023-06-25 DIAGNOSIS — M179 Osteoarthritis of knee, unspecified: Secondary | ICD-10-CM | POA: Diagnosis not present

## 2023-06-27 DIAGNOSIS — R0789 Other chest pain: Secondary | ICD-10-CM | POA: Diagnosis not present

## 2023-06-28 ENCOUNTER — Ambulatory Visit
Admission: RE | Admit: 2023-06-28 | Discharge: 2023-06-28 | Disposition: A | Discharge status: Home / Self Care | Payer: Medicare HMO | Source: Ambulatory Visit | Attending: Internal Medicine | Admitting: Internal Medicine

## 2023-06-28 ENCOUNTER — Encounter: Payer: Self-pay | Admitting: Neurology

## 2023-06-28 ENCOUNTER — Ambulatory Visit: Payer: Medicare HMO | Admitting: Neurology

## 2023-06-28 VITALS — BP 119/74 | HR 71 | Ht 62.0 in | Wt 170.0 lb

## 2023-06-28 DIAGNOSIS — Z1231 Encounter for screening mammogram for malignant neoplasm of breast: Secondary | ICD-10-CM | POA: Diagnosis not present

## 2023-06-28 DIAGNOSIS — R0683 Snoring: Secondary | ICD-10-CM | POA: Diagnosis not present

## 2023-06-28 DIAGNOSIS — H93A2 Pulsatile tinnitus, left ear: Secondary | ICD-10-CM | POA: Insufficient documentation

## 2023-06-28 DIAGNOSIS — Z8669 Personal history of other diseases of the nervous system and sense organs: Secondary | ICD-10-CM | POA: Diagnosis not present

## 2023-06-28 DIAGNOSIS — R519 Headache, unspecified: Secondary | ICD-10-CM | POA: Diagnosis not present

## 2023-06-28 NOTE — Patient Instructions (Signed)
Screening for Sleep Apnea  Sleep apnea is a condition in which breathing pauses or becomes shallow during sleep. Sleep apnea screening is a test to determine if you are at risk for sleep apnea. The test includes a series of questions. It will only takes a few minutes. Your health care provider may ask you to have this test in preparation for surgery or as part of a physical exam. What are the symptoms of sleep apnea? Common symptoms of sleep apnea include: Snoring. Waking up often at night. Daytime sleepiness. Pauses in breathing. Choking or gasping during sleep. Irritability. Forgetfulness. Trouble thinking clearly. Depression. Personality changes. Most people with sleep apnea do not know that they have it. What are the advantages of sleep apnea screening? Getting screened for sleep apnea can help: Ensure your safety. It is important for your health care providers to know whether or not you have sleep apnea, especially if you are having surgery or have other long-term (chronic) health conditions. Improve your health and allow you to get a better night's rest. Restful sleep can help you: Have more energy. Lose weight. Improve high blood pressure. Improve diabetes management. Prevent stroke. Prevent car accidents. What happens during the screening? Screening usually includes being asked a list of questions about your sleep quality. Some questions you may be asked include: Do you snore? Is your sleep restless? Do you have daytime sleepiness? Has a partner or spouse told you that you stop breathing during sleep? Have you had trouble concentrating or memory loss? What is your age? What is your neck circumference? To measure your neck, keep your back straight and gently wrap the tape measure around your neck. Put the tape measure at the middle of your neck, between your chin and collarbone. What is your sex assigned at birth? Do you have or are you being treated for high blood  pressure? If your screening test is positive, you are at risk for the condition. Further testing may be needed to confirm a diagnosis of sleep apnea. Where to find more information You can find screening tools online or at your health care clinic. For more information about sleep apnea screening and healthy sleep, visit these websites: Centers for Disease Control and Prevention: www.cdc.gov American Sleep Apnea Association: www.sleepapnea.org Contact a health care provider if: You think that you may have sleep apnea. Summary Sleep apnea screening can help determine if you are at risk for sleep apnea. It is important for your health care providers to know whether or not you have sleep apnea, especially if you are having surgery or have other chronic health conditions. You may be asked to take a screening test for sleep apnea in preparation for surgery or as part of a physical exam. This information is not intended to replace advice given to you by your health care provider. Make sure you discuss any questions you have with your health care provider. Document Revised: 06/14/2020 Document Reviewed: 06/14/2020 Elsevier Patient Education  2024 [employer]  

## 2023-06-28 NOTE — Progress Notes (Signed)
SLEEP MEDICINE CLINIC    Provider:  Melvyn Novas, MD  Primary Care Physician:  Chilton Greathouse, MD [address] [place] [address]     Referring Provider: Chilton Greathouse, Md [address] [place],  [place] [address]          Chief Complaint according to patient   Patient presents with:     New Patient (Initial Visit) with history of claustrophobia and dx with OSA over 20 years ago.            HISTORY OF PRESENT ILLNESS:  Anne Hogan is a 77 y.o. female patient who is seen upon referral on 06/28/2023 from Dr Felipa Eth  for a new consult , she is using a dental device.  She believed to be claustrophobic.  She cannot remember a previous sleep consult and not where the sleep test was done. I have no access to any records sleep related in Epic. Her dentist has changed, her last name had changed.. She arrived exactly at appointment time, and is still filling paperwork out 15 minutes later.  She was yesterday seen at [facility] in [facility] for chest pains , Echo was normal. Top of the crown headaches, after bending forward.       Chief concern according to patient :  I have the pleasure of seeing Anne Hogan on 06/28/23 a right -handed female patient  with a possible sleep disorder.  She has been dx with OSA ( when ? Where/0 a how severe ? But felt claustrophobic at the time, didn't want CPAP, and never tried it.  She is using a dental device.   The patient had the first sleep study at unknown place and unknown time. She had on 08-03-2019 a mitral valve repair and a mass was found in her chest- she has been followed since 2021 every 6 months by oncologist. She had regularly echocardiograms since 2017, Echo TEE and catheterization on 05-26-2019,    Sleep relevant medical history: Nocturia - 1 time or none ,Tonsillectomy as a child,  deviated septum, but no repair. Lung mass, MVR.  Family medical /sleep history: NO other family member on CPAP with OSA,  insomnia, sleep walkers.    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 She lives in a household alone. Family status is widowed since 08-07-2017 , without biological children. Pets are present, 2 dogs.  Tobacco use: quit smoking in 1987.   ETOH use :none ,  Caffeine intake in form of Coffee( 1-2 / in am ) Soda( /) Tea ( 1-2) , no  energy drinks Exercise: no routine.    Sleep habits are as follows: The patient's dinner time is between 5-7 PM. The patient goes to bed at 11-11.30 PM and continues to sleep for 7 hours, wakes for 0-1 bathroom breaks, the first time at 3 AM.   The preferred sleep position is supine / GERD- , with the support of a wedge . She is restricted by back pain, too. Dreams are reportedly rare now.    The patient wakes up spontaneously ( woken by dog) before her alarm. 5-7  AM is the usual rise time. She reports not feeling refreshed or restored in AM, with symptoms such as dry mouth, mild  morning headaches, and residual fatigue.  Naps are taken frequently, lin a recliner- daily sometimes 2-3 times a day , and lasting from 30 to 60 minutes and are no more  refreshing than nocturnal sleep.  Recliner helps her chest, GERD and back.    Review of Systems: Out of a complete 14 system review, the patient complains of only the following symptoms, and all other reviewed systems are negative.:  Fatigue, sleepiness , snoring, fragmented sleep, chest pain, all day napping on and of.    How likely are you to doze in the following situations: 0 = not likely, 1 = slight chance, 2 = moderate chance, 3 = high chance   Sitting and Reading? Watching Television? Sitting inactive in a public place (theater or meeting)? As a passenger in a car for an hour without a break? Lying down in the afternoon when circumstances permit? Sitting and talking to someone? Sitting quietly after lunch without alcohol? In a car, while stopped for a few minutes in  traffic?   Total = 7/ 24 points   FSS endorsed at ( did not finish ) / 63 points.   GDS 1/ 15   Social History   Socioeconomic History   Marital status: Widowed    Spouse name: Not on file   Number of children: Not on file   Years of education: Not on file   Highest education level: Master's degree (e.g., MA, MS, MEng, MEd, MSW, MBA)  Occupational History   Occupation: Retired  Tobacco Use   Smoking status: Former    Current packs/day: 0.00    Average packs/day: 1.5 packs/day for 20.0 years (30.0 ttl pk-yrs)    Types: Cigarettes    Start date: 11/30/1966    Quit date: 11/30/1986    Years since quitting: 36.6   Smokeless tobacco: Never  Vaping Use   Vaping status: Never Used  Substance and Sexual Activity   Alcohol use: Yes    Comment: RARE   Drug use: No   Sexual activity: Not Currently  Other Topics Concern   Not on file  Social History Narrative   Pt lives alone    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 Strain: Not on file  Food Insecurity: Not on file  Transportation Needs: Not on file  Physical Activity: Not on file  Stress: Not on file  Social Connections: Not on file    Family History  Problem Relation Age of Onset   Tremor Mother    Osteoporosis Mother    Sarcoidosis Mother    Hypertension Mother    Congestive Heart Failure Father    Heart attack Father    Diabetes Mellitus II Father    Diabetes Mellitus II Brother    Sleep apnea Neg Hx     Past Medical History:  Diagnosis Date   ADD (attention deficit disorder)    Arthritis    "right knee" (04/19/2013)   Asthma 04/03/2019   CHF (congestive heart failure) (HCC)    Chronic diastolic congestive heart failure (HCC)    Claustrophobia    Depression    Dyspnea    Dysrhythmia    Fracture of right tibial plateau 05/2016   GERD (gastroesophageal reflux disease)    Heart murmur    Incidental pulmonary nodule, greater than or equal to 8mm 06/06/2019   Needs f/u CT in 3  months   Migraine    "bad when I was in 5th or 6th grade; I can usually ward them off by resting my head now" (04/19/2013)   MVP (mitral valve prolapse)    S/P minimally-invasive mitral valve repair 08/03/2019   Complex valvuloplasty including artificial Gore-tex  neochords x12 and Sorin Memo 4D ring annuloplasty, size 38 mm   S/P patent foramen ovale closure 08/03/2019   Sleep apnea    has not used mouth guard in 5 years no cpap used    Squamous carcinoma ?1999   "upper left groin" (04/19/2013)    Past Surgical History:  Procedure Laterality Date   CARDIAC CATHETERIZATION  05/26/2019   CHOLECYSTECTOMY  04/18/2013   CHOLECYSTECTOMY N/A 04/18/2013   Procedure: LAPAROSCOPIC CHOLECYSTECTOMY WITH INTRAOPERATIVE CHOLANGIOGRAM;  Surgeon: Axel Filler, MD;  Location: [facility] OR;  Service: General;  Laterality: N/A;   COLONOSCOPY W/ POLYPECTOMY     EYE SURGERY Bilateral FALL  2016   IOC FOR CATARACTS   FRACTURE SURGERY     INTERCOSTAL NERVE BLOCK Left 11/23/2019   Procedure: Intercostal Nerve Block;  Surgeon: Loreli Slot, MD;  Location: [facility] OR;  Service: Thoracic;  Laterality: Left;   JOINT REPLACEMENT     KNEE ARTHROSCOPY Right 12/2007   "meniscus repair" (04/19/2013)   LYMPH NODE DISSECTION Left 11/23/2019   Procedure: Lymph Node Dissection;  Surgeon: Loreli Slot, MD;  Location: [facility] OR;  Service: Thoracic;  Laterality: Left;   MITRAL VALVE REPAIR Right 08/03/2019   Procedure: MINIMALLY INVASIVE MITRAL VALVE REPAIR (MVR) using Memo 4D 38 MM Mitral Valve.;  Surgeon: Purcell Nails, MD;  Location: [facility] OR;  Service: Open Heart Surgery;  Laterality: Right;   REPAIR OF PATENT FORAMEN OVALE N/A 08/03/2019   Procedure: CLOSURE OF PATENT FORAMEN OVALE;  Surgeon: Purcell Nails, MD;  Location: [facility] OR;  Service: Open Heart Surgery;  Laterality: N/A;   RETINAL TEAR REPAIR CRYOTHERAPY Left ~ 1997   RIGHT/LEFT HEART CATH AND CORONARY ANGIOGRAPHY N/A 05/26/2019   Procedure: RIGHT/LEFT HEART CATH AND  CORONARY ANGIOGRAPHY;  Surgeon: Corky Crafts, MD;  Location: [facility] INVASIVE CV LAB;  Service: Cardiovascular;  Laterality: N/A;   TEE WITHOUT CARDIOVERSION N/A 05/26/2019   Procedure: TRANSESOPHAGEAL ECHOCARDIOGRAM (TEE);  Surgeon: Wendall Stade, MD;  Location: [facility] ENDOSCOPY;  Service: Cardiovascular;  Laterality: N/A;   TEE WITHOUT CARDIOVERSION N/A 08/03/2019   Procedure: TRANSESOPHAGEAL ECHOCARDIOGRAM (TEE);  Surgeon: Purcell Nails, MD;  Location: [facility] OR;  Service: Open Heart Surgery;  Laterality: N/A;   TONSILLECTOMY  ~ 1952   TOTAL KNEE ARTHROPLASTY Right 09/21/2016   Procedure: RIGHT TOTAL KNEE ARTHROPLASTY;  Surgeon: Ollen Gross, MD;  Location: [facility] ORS;  Service: Orthopedics;  Laterality: Right;  requests with abductor block   VARICOSE VEIN SURGERY Right 2000's   VASCULAR SURGERY     WRIST SURGERY Right 1975   "had a knot taken off" (04/19/2013)     Current Outpatient Medications on File Prior to Visit  Medication Sig Dispense Refill   acetaminophen (TYLENOL) 500 MG tablet Take 500-1,000 mg by mouth every 6 (six) hours as needed for moderate pain (pain).      aspirin EC 81 MG EC tablet Take 1 tablet (81 mg total) by mouth daily.     BREO ELLIPTA 100-25 MCG/INH AEPB USE 1 PUFF DAILY AS DIRECTED 60 each 0   doxycycline (VIBRAMYCIN) 100 MG capsule Take 1 capsule (100 mg total) one hour prior to any dental procedure 2 capsule 3   montelukast (SINGULAIR) 10 MG tablet Take 10 mg by mouth daily as needed (respiratory issues.).     Multiple Vitamins-Minerals (CENTRUM SILVER 50+WOMEN PO) Take 1 tablet by mouth daily.     pantoprazole (PROTONIX) 40 MG tablet Take 40 mg by mouth at bedtime.  polyethylene glycol (MIRALAX / GLYCOLAX) 17 g packet Take 17 g by mouth daily.     No current facility-administered medications on file prior to visit.    Allergies  Allergen Reactions   Penicillins Rash    Has patient had a PCN reaction causing immediate rash, facial/tongue/throat  swelling, SOB or lightheadedness with hypotension: No Has patient had a PCN reaction causing severe rash involving mucus membranes or skin necrosis: no Has patient had a PCN reaction that required hospitalization: No Has patient had a PCN reaction occurring within the last 10 years: no If all of the above answers are "NO", then may proceed with Cephalosporin use.     Codeine Other (See Comments)    Possible sensitivity, patient not sure   Milk-Related Compounds Nausea And Vomiting and Other (See Comments)    Can have ice cream, pt just doesn't want to drink milk   Morphine And Codeine     "strange sensation across midsection and chest"     DIAGNOSTIC DATA (LABS, IMAGING, TESTING) - I reviewed patient records, labs, notes, testing and imaging myself where available.  Lab Results  Component Value Date   WBC 5.9 12/22/2022   HGB 14.4 12/22/2022   HCT 41.9 12/22/2022   MCV 88.6 12/22/2022   PLT 334 12/22/2022      Component Value Date/Time   NA 140 12/22/2022 1338   NA 140 05/12/2019 1243   K 4.5 12/22/2022 1338   CL 107 12/22/2022 1338   CO2 26 12/22/2022 1338   GLUCOSE 99 12/22/2022 1338   BUN 20 12/22/2022 1338   BUN 14 05/12/2019 1243   CREATININE 1.12 (H) 12/22/2022 1338   CREATININE 1.05 (H) 07/19/2019 1206   CALCIUM 9.7 12/22/2022 1338   PROT 7.1 12/22/2022 1338   ALBUMIN 4.1 12/22/2022 1338   AST 18 12/22/2022 1338   ALT 20 12/22/2022 1338   ALKPHOS 67 12/22/2022 1338   BILITOT 0.7 12/22/2022 1338   GFRNONAA 51 (L) 12/22/2022 1338   GFRAA 48 (L) 12/25/2019 1310   No results found for: "CHOL", "HDL", "LDLCALC", "LDLDIRECT", "TRIG", "CHOLHDL" Lab Results  Component Value Date   HGBA1C 5.2 07/24/2019   No results found for: "VITAMINB12" Lab Results  Component Value Date   TSH 2.265 08/05/2019    PHYSICAL EXAM:  Today's Vitals   06/28/23 1011  BP: 119/74  Pulse: 71  Weight: 170 lb (77.1 kg)  Height: 5\' 2"  (1.575 m)   Body mass index is 31.09 kg/m.    Wt Readings from Last 3 Encounters:  06/28/23 170 lb (77.1 kg)  12/28/22 169 lb 1.6 oz (76.7 kg)  06/24/22 168 lb 12.8 oz (76.6 kg)     Ht Readings from Last 3 Encounters:  06/28/23 5\' 2"  (1.575 m)  05/01/22 5' 2.5" (1.588 m)  12/23/21 5' 2.5" (1.588 m)      General: The patient is awake, alert and appears not in acute distress. The patient is well groomed. Head: Normocephalic, atraumatic. Neck is supple.  Mallampati 3,  neck circumference:15 inches . Nasal airflow patent.  Retrognathia is not seen.  Braces, had overbite , teeth were pulled for braces. Crowded.  Dental status: biological  Cardiovascular:  Regular rate and cardiac rhythm by pulse,    without distended neck veins. Respiratory: Lungs are clear to auscultation.  Skin:  With evidence of very mild ankle edema. Trunk: The patient's posture is erect.   NEUROLOGIC EXAM: The patient is awake and alert, oriented to place and time.  Memory subjective described as intact. Logorrhoeic and circumferential thoughts.  Attention span & concentration ability appears normal.  Speech is fluent,  without  dysarthria, dysphonia or aphasia.  Mood and affect are appropriate.   Cranial nerves: no loss of smell or taste reported  Pupils are equal and briskly reactive to light. Funduscopic exam deferred. Cataract surgery bilaterally,  arcus senilis on the top of the iris.  Extraocular movements in vertical and horizontal planes were intact and without nystagmus. No Diplopia. Visual fields by finger perimetry are intact. Hearing was intact to soft voice and finger rubbing.    Facial sensation intact to fine touch.  Facial motor strength is symmetric and tongue and uvula move midline.  Neck ROM : rotation, tilt and flexion extension were normal for age and shoulder shrug was symmetrical.    Motor exam:  Symmetric bulk, tone and ROM.   Normal tone without cog wheeling, symmetric grip strength .   Sensory:  Fine touch, pinprick and  vibration were tested  and  normal.  Proprioception tested in the upper extremities was normal.   Coordination: Rapid alternating movements in the fingers/hands were of normal speed.  The Finger-to-nose maneuver was intact without evidence of ataxia, dysmetria or tremor.   Gait and station: Patient could rise unassisted from a seated position, walked without assistive device.  Stance is of normal width/ base and the patient turned with 3 steps.  Toe and heel walk were deferred.  Deep tendon reflexes: in the  upper and lower extremities are symmetric and intact.  Babinski response was deferred .    ASSESSMENT AND PLAN 77 y.o. year old female  here with: Atypical chest pain and headache yesterday seen at [facility] urgent care / primary care.   History of OSA, but no documented previous sleep study.   Patient has prior history of Echocardiogram examinations, most recent 1/ 15/ 2021. CHF, Mitral Valve Prolapse, Signs/ Symptoms: Dyspnea and Murmur; Risk Factors: Former Smoker, Family History of Coronary Artery Disease and Sleep Apnea. Mitral Valve Prolapse with Severe MR status post Sorin memo ring anuloplasty ( 38 mm) and PFO Closure ( 1- 14- 21)    1) I will ask this patient to come to the sleep lab and allow SPLIT of her AHI exceeds 15/ h/ I am not sure she has apnea but she has risk factors.  She used to snore, but she used to drink a some alcohol at that time.   2) I ordered a carotid doppler study- left tinnitus with pulsation is new. No bruit that I could hear.   3) AM headache, one time with waking up but not waking her up from sleep  - crown of the head.    I plan to follow up either personally or through our NP within 4-5 months.   I would like to thank Chilton Greathouse, MD [address] [place],  [place] [address] for allowing me to meet with and to take care of this pleasant patient.    After spending a total time of  45  minutes face to face and additional time for physical and  neurologic examination, review of laboratory studies,  personal review of imaging studies, reports and results of other testing and review of referral information / records as far as provided in visit,   Electronically signed by: Melvyn Novas, MD 06/28/2023 10:13 AM  [facility] Board certified by The [employer] of Sleep Medicine and Diplomate of the [employer] of  Sleep Medicine. Board certified In Neurology through the ABPN, Fellow of the [employer] of Neurology.

## 2023-07-01 ENCOUNTER — Other Ambulatory Visit: Payer: Self-pay | Admitting: Internal Medicine

## 2023-07-01 DIAGNOSIS — R928 Other abnormal and inconclusive findings on diagnostic imaging of breast: Secondary | ICD-10-CM

## 2023-07-02 ENCOUNTER — Other Ambulatory Visit: Payer: Self-pay | Admitting: Neurology

## 2023-07-02 DIAGNOSIS — R519 Headache, unspecified: Secondary | ICD-10-CM

## 2023-07-02 DIAGNOSIS — H93A2 Pulsatile tinnitus, left ear: Secondary | ICD-10-CM

## 2023-07-02 DIAGNOSIS — Z8669 Personal history of other diseases of the nervous system and sense organs: Secondary | ICD-10-CM

## 2023-07-06 ENCOUNTER — Telehealth: Payer: Self-pay | Admitting: Neurology

## 2023-07-06 NOTE — Telephone Encounter (Signed)
Split- [employer] pending uploaded notes.

## 2023-07-07 DIAGNOSIS — M2042 Other hammer toe(s) (acquired), left foot: Secondary | ICD-10-CM | POA: Diagnosis not present

## 2023-07-07 DIAGNOSIS — L84 Corns and callosities: Secondary | ICD-10-CM | POA: Diagnosis not present

## 2023-07-07 DIAGNOSIS — M2011 Hallux valgus (acquired), right foot: Secondary | ICD-10-CM | POA: Diagnosis not present

## 2023-07-07 DIAGNOSIS — M2012 Hallux valgus (acquired), left foot: Secondary | ICD-10-CM | POA: Diagnosis not present

## 2023-07-07 DIAGNOSIS — M2041 Other hammer toe(s) (acquired), right foot: Secondary | ICD-10-CM | POA: Diagnosis not present

## 2023-07-07 DIAGNOSIS — R234 Changes in skin texture: Secondary | ICD-10-CM | POA: Diagnosis not present

## 2023-07-12 NOTE — Telephone Encounter (Signed)
Split [employer] medicare auth: [id number] (exp. 07/06/23 to 01/04/24)   Patient is scheduled at [facility] for 07/18/23 at 9 pm.  I mailed packet to the patient but just incase she doesn't receive it in time I informed her to eat her evening meal before arriving, bring comfortable clothes to sleep in, any medication she may take at night or early morning.

## 2023-07-12 NOTE — Telephone Encounter (Signed)
Split [employer] auth: [id number] (exp. 07/06/23 to 01/04/24)

## 2023-07-15 ENCOUNTER — Ambulatory Visit (HOSPITAL_COMMUNITY)
Admission: RE | Admit: 2023-07-15 | Discharge: 2023-07-15 | Disposition: A | Discharge status: Home / Self Care | Payer: Medicare HMO | Source: Ambulatory Visit | Attending: Neurology | Admitting: Neurology

## 2023-07-15 ENCOUNTER — Ambulatory Visit (HOSPITAL_COMMUNITY): Payer: Medicare HMO

## 2023-07-15 DIAGNOSIS — Z8669 Personal history of other diseases of the nervous system and sense organs: Secondary | ICD-10-CM | POA: Insufficient documentation

## 2023-07-15 DIAGNOSIS — H93A2 Pulsatile tinnitus, left ear: Secondary | ICD-10-CM | POA: Insufficient documentation

## 2023-07-15 DIAGNOSIS — R519 Headache, unspecified: Secondary | ICD-10-CM | POA: Diagnosis not present

## 2023-07-15 NOTE — Progress Notes (Signed)
Carotid arterial duplex completed. Please see CV Procedures for preliminary results.  Shona Simpson, RVT 07/15/23 3:17 PM

## 2023-07-18 ENCOUNTER — Ambulatory Visit (INDEPENDENT_AMBULATORY_CARE_PROVIDER_SITE_OTHER): Payer: Medicare HMO | Admitting: Neurology

## 2023-07-18 DIAGNOSIS — R0683 Snoring: Secondary | ICD-10-CM

## 2023-07-18 DIAGNOSIS — Z8669 Personal history of other diseases of the nervous system and sense organs: Secondary | ICD-10-CM

## 2023-07-18 DIAGNOSIS — H93A2 Pulsatile tinnitus, left ear: Secondary | ICD-10-CM

## 2023-07-18 DIAGNOSIS — R519 Headache, unspecified: Secondary | ICD-10-CM

## 2023-07-19 ENCOUNTER — Telehealth: Payer: Self-pay

## 2023-07-19 NOTE — Telephone Encounter (Signed)
-----   Message from Eupora Dohmeier sent at 07/18/2023 11:14 PM EST ----- Normal carotid flow, no stenosis.   Cc Dr Felipa Eth.

## 2023-07-19 NOTE — Telephone Encounter (Signed)
I spoke with the patient and provided the results of the [place]. She verbalized understanding of the findings and expressed appreciation for the call.

## 2023-07-20 ENCOUNTER — Ambulatory Visit
Admission: RE | Admit: 2023-07-20 | Discharge: 2023-07-20 | Disposition: A | Discharge status: Home / Self Care | Payer: Medicare HMO | Source: Ambulatory Visit | Attending: Internal Medicine | Admitting: Internal Medicine

## 2023-07-20 DIAGNOSIS — R928 Other abnormal and inconclusive findings on diagnostic imaging of breast: Secondary | ICD-10-CM

## 2023-07-27 ENCOUNTER — Encounter: Payer: Self-pay | Admitting: Neurology

## 2023-12-20 DIAGNOSIS — C3492 Malignant neoplasm of unspecified part of left bronchus or lung: Secondary | ICD-10-CM | POA: Diagnosis not present

## 2023-12-20 DIAGNOSIS — I8393 Asymptomatic varicose veins of bilateral lower extremities: Secondary | ICD-10-CM | POA: Diagnosis not present

## 2023-12-20 DIAGNOSIS — R7301 Impaired fasting glucose: Secondary | ICD-10-CM | POA: Diagnosis not present

## 2023-12-20 DIAGNOSIS — M179 Osteoarthritis of knee, unspecified: Secondary | ICD-10-CM | POA: Diagnosis not present

## 2023-12-20 DIAGNOSIS — K219 Gastro-esophageal reflux disease without esophagitis: Secondary | ICD-10-CM | POA: Diagnosis not present

## 2023-12-20 DIAGNOSIS — E785 Hyperlipidemia, unspecified: Secondary | ICD-10-CM | POA: Diagnosis not present

## 2023-12-20 DIAGNOSIS — J45909 Unspecified asthma, uncomplicated: Secondary | ICD-10-CM | POA: Diagnosis not present

## 2023-12-20 DIAGNOSIS — M79671 Pain in right foot: Secondary | ICD-10-CM | POA: Diagnosis not present

## 2023-12-20 DIAGNOSIS — M545 Low back pain, unspecified: Secondary | ICD-10-CM | POA: Diagnosis not present

## 2023-12-20 DIAGNOSIS — M8580 Other specified disorders of bone density and structure, unspecified site: Secondary | ICD-10-CM | POA: Diagnosis not present

## 2023-12-20 DIAGNOSIS — N1831 Chronic kidney disease, stage 3a: Secondary | ICD-10-CM | POA: Diagnosis not present

## 2023-12-22 ENCOUNTER — Other Ambulatory Visit: Payer: PPO

## 2023-12-23 ENCOUNTER — Ambulatory Visit (HOSPITAL_COMMUNITY)
Admission: RE | Admit: 2023-12-23 | Discharge: 2023-12-23 | Disposition: A | Discharge status: Home / Self Care | Payer: Self-pay | Source: Ambulatory Visit | Attending: Physician Assistant | Admitting: Physician Assistant

## 2023-12-23 ENCOUNTER — Inpatient Hospital Stay: Payer: PPO | Attending: Internal Medicine

## 2023-12-23 DIAGNOSIS — C3492 Malignant neoplasm of unspecified part of left bronchus or lung: Secondary | ICD-10-CM | POA: Insufficient documentation

## 2023-12-23 DIAGNOSIS — C3490 Malignant neoplasm of unspecified part of unspecified bronchus or lung: Secondary | ICD-10-CM | POA: Diagnosis not present

## 2023-12-23 DIAGNOSIS — C3432 Malignant neoplasm of lower lobe, left bronchus or lung: Secondary | ICD-10-CM | POA: Diagnosis not present

## 2023-12-23 DIAGNOSIS — K449 Diaphragmatic hernia without obstruction or gangrene: Secondary | ICD-10-CM | POA: Diagnosis not present

## 2023-12-23 LAB — CMP (CANCER CENTER ONLY)
ALT: 16 U/L (ref 0–44)
AST: 18 U/L (ref 15–41)
Albumin: 4.3 g/dL (ref 3.5–5.0)
Alkaline Phosphatase: 66 U/L (ref 38–126)
Anion gap: 4 — ABNORMAL LOW (ref 5–15)
BUN: 19 mg/dL (ref 8–23)
CO2: 29 mmol/L (ref 22–32)
Calcium: 9.5 mg/dL (ref 8.9–10.3)
Chloride: 106 mmol/L (ref 98–111)
Creatinine: 1.15 mg/dL — ABNORMAL HIGH (ref 0.44–1.00)
GFR, Estimated: 49 mL/min — ABNORMAL LOW (ref >60)
Glucose, Bld: 90 mg/dL (ref 70–99)
Potassium: 4.3 mmol/L (ref 3.5–5.1)
Sodium: 139 mmol/L (ref 135–145)
Total Bilirubin: 0.9 mg/dL (ref 0.0–1.2)
Total Protein: 7.2 g/dL (ref 6.5–8.1)

## 2023-12-23 LAB — CBC WITH DIFFERENTIAL (CANCER CENTER ONLY)
Abs Immature Granulocytes: 0.01 10*3/uL (ref 0.00–0.07)
Basophils Absolute: 0.1 10*3/uL (ref 0.0–0.1)
Basophils Relative: 1 %
Eosinophils Absolute: 0.2 10*3/uL (ref 0.0–0.5)
Eosinophils Relative: 3 %
HCT: 43.2 % (ref 36.0–46.0)
Hemoglobin: 14.6 g/dL (ref 12.0–15.0)
Immature Granulocytes: 0 %
Lymphocytes Relative: 23 %
Lymphs Abs: 1.5 10*3/uL (ref 0.7–4.0)
MCH: 29.8 pg (ref 26.0–34.0)
MCHC: 33.8 g/dL (ref 30.0–36.0)
MCV: 88.2 fL (ref 80.0–100.0)
Monocytes Absolute: 0.9 10*3/uL (ref 0.1–1.0)
Monocytes Relative: 13 %
Neutro Abs: 4 10*3/uL (ref 1.7–7.7)
Neutrophils Relative %: 60 %
Platelet Count: 338 10*3/uL (ref 150–400)
RBC: 4.9 MIL/uL (ref 3.87–5.11)
RDW: 12.5 % (ref 11.5–15.5)
WBC Count: 6.6 10*3/uL (ref 4.0–10.5)
nRBC: 0 % (ref 0.0–0.2)

## 2023-12-23 MED ORDER — IOHEXOL 300 MG/ML  SOLN
75.0000 mL | Freq: Once | INTRAMUSCULAR | Status: AC | PRN
  Administered 2023-12-23: 75 mL via INTRAVENOUS

## 2023-12-27 DIAGNOSIS — M65871 Other synovitis and tenosynovitis, right ankle and foot: Secondary | ICD-10-CM | POA: Diagnosis not present

## 2023-12-27 DIAGNOSIS — M76821 Posterior tibial tendinitis, right leg: Secondary | ICD-10-CM | POA: Diagnosis not present

## 2023-12-27 DIAGNOSIS — M2141 Flat foot [pes planus] (acquired), right foot: Secondary | ICD-10-CM | POA: Diagnosis not present

## 2023-12-27 DIAGNOSIS — M19072 Primary osteoarthritis, left ankle and foot: Secondary | ICD-10-CM | POA: Diagnosis not present

## 2023-12-27 DIAGNOSIS — M2142 Flat foot [pes planus] (acquired), left foot: Secondary | ICD-10-CM | POA: Diagnosis not present

## 2023-12-27 DIAGNOSIS — M19071 Primary osteoarthritis, right ankle and foot: Secondary | ICD-10-CM | POA: Diagnosis not present

## 2023-12-27 DIAGNOSIS — M2042 Other hammer toe(s) (acquired), left foot: Secondary | ICD-10-CM | POA: Diagnosis not present

## 2023-12-27 DIAGNOSIS — M2012 Hallux valgus (acquired), left foot: Secondary | ICD-10-CM | POA: Diagnosis not present

## 2023-12-27 DIAGNOSIS — M2011 Hallux valgus (acquired), right foot: Secondary | ICD-10-CM | POA: Diagnosis not present

## 2023-12-27 DIAGNOSIS — M2041 Other hammer toe(s) (acquired), right foot: Secondary | ICD-10-CM | POA: Diagnosis not present

## 2023-12-28 ENCOUNTER — Inpatient Hospital Stay: Payer: PPO | Admitting: Internal Medicine

## 2023-12-28 VITALS — BP 116/64 | HR 84 | Temp 97.7°F | Resp 19 | Wt 175.1 lb

## 2023-12-28 DIAGNOSIS — C3490 Malignant neoplasm of unspecified part of unspecified bronchus or lung: Secondary | ICD-10-CM

## 2023-12-28 DIAGNOSIS — C3432 Malignant neoplasm of lower lobe, left bronchus or lung: Secondary | ICD-10-CM | POA: Diagnosis not present

## 2023-12-28 NOTE — Progress Notes (Signed)
 [facility] Telephone:([phone]) [phone]   Fax:([phone]) [phone]  OFFICE PROGRESS NOTE  Anne Robins, MD [address] [place] [address]  DIAGNOSIS: Stage IA (T1c, N0, M0) non-small cell lung cancer, adenocarcinoma diagnosed in May 2021.  She has multiple other pulmonary nodules that need close monitoring.  PRIOR THERAPY: Status post left lower lobectomy with lymph node dissection under the care of Dr. Luna Salinas on 11/23/2019.  CURRENT THERAPY: Observation.  INTERVAL HISTORY: Anne Hogan 78 y.o. female returns to the clinic today for annual follow-up visit. Discussed the use of AI scribe software for clinical note transcription with the patient, who gave verbal consent to proceed.  History of Present Illness   Anne Hogan is a 78 year old female with stage one non-small cell lung cancer who presents for evaluation and repeat CT scan for restaging of her disease.  She was diagnosed with stage one non-small cell lung cancer, adenocarcinoma, in May 2021. She underwent a left lower lobectomy with lymph node dissection and has been on observation since then. Previous scans have shown stable ground glass nodules that have been monitored over time.  She experiences pain in her right foot and back. The foot pain, described as tendonitis, is persistent. She recently consulted a podiatrist for this issue. Her back pain is located in the lower back and has not been formally diagnosed. She has not seen an orthopedic surgeon yet. She previously visited a chiropractor for neck issues but is hesitant to continue without a clear diagnosis.  No significant chest congestion, even during colds, which she finds unusual given her past experiences with congestion in the fall and spring.          MEDICAL HISTORY: Past Medical History:  Diagnosis Date   ADD (attention deficit disorder)    Arthritis    "right knee" (04/19/2013)   Asthma 04/03/2019   CHF (congestive heart  failure) (HCC)    Chronic diastolic congestive heart failure (HCC)    Claustrophobia    Depression    Dyspnea    Dysrhythmia    Fracture of right tibial plateau 05/2016   GERD (gastroesophageal reflux disease)    Heart murmur    Incidental pulmonary nodule, greater than or equal to 8mm 06/06/2019   Needs f/u CT in 3 months   Migraine    "bad when I was in 5th or 6th grade; I can usually ward them off by resting my head now" (04/19/2013)   MVP (mitral valve prolapse)    S/P minimally-invasive mitral valve repair 08/03/2019   Complex valvuloplasty including artificial Gore-tex neochords x12 and Sorin Memo 4D ring annuloplasty, size 38 mm   S/P patent foramen ovale closure 08/03/2019   Sleep apnea    has not used mouth guard in 5 years no cpap used    Squamous carcinoma ?1999   "upper left groin" (04/19/2013)    ALLERGIES:  is allergic to penicillins, codeine, milk-related compounds, and morphine  and codeine.  MEDICATIONS:  Current Outpatient Medications  Medication Sig Dispense Refill   acetaminophen  (TYLENOL ) 500 MG tablet Take 500-1,000 mg by mouth every 6 (six) hours as needed for moderate pain (pain).      aspirin  EC 81 MG EC tablet Take 1 tablet (81 mg total) by mouth daily.     BREO ELLIPTA  100-25 MCG/INH AEPB USE 1 PUFF DAILY AS DIRECTED 60 each 0   doxycycline  (VIBRAMYCIN ) 100 MG capsule Take 1 capsule (100 mg total) one hour prior  to any dental procedure 2 capsule 3   montelukast  (SINGULAIR ) 10 MG tablet Take 10 mg by mouth daily as needed (respiratory issues.).     Multiple Vitamins-Minerals (CENTRUM SILVER 50+WOMEN PO) Take 1 tablet by mouth daily.     pantoprazole  (PROTONIX ) 40 MG tablet Take 40 mg by mouth at bedtime.     polyethylene glycol (MIRALAX  / GLYCOLAX ) 17 g packet Take 17 g by mouth daily.     No current facility-administered medications for this visit.    SURGICAL HISTORY:  Past Surgical History:  Procedure Laterality Date   CARDIAC CATHETERIZATION   05/26/2019   CHOLECYSTECTOMY  04/18/2013   CHOLECYSTECTOMY N/A 04/18/2013   Procedure: LAPAROSCOPIC CHOLECYSTECTOMY WITH INTRAOPERATIVE CHOLANGIOGRAM;  Surgeon: Shela Derby, MD;  Location: [facility] OR;  Service: General;  Laterality: N/A;   COLONOSCOPY W/ POLYPECTOMY     EYE SURGERY Bilateral FALL  2016   IOC FOR CATARACTS   FRACTURE SURGERY     INTERCOSTAL NERVE BLOCK Left 11/23/2019   Procedure: Intercostal Nerve Block;  Surgeon: Zelphia Higashi, MD;  Location: [facility] OR;  Service: Thoracic;  Laterality: Left;   JOINT REPLACEMENT     KNEE ARTHROSCOPY Right 12/2007   "meniscus repair" (04/19/2013)   LYMPH NODE DISSECTION Left 11/23/2019   Procedure: Lymph Node Dissection;  Surgeon: Zelphia Higashi, MD;  Location: [facility] OR;  Service: Thoracic;  Laterality: Left;   MITRAL VALVE REPAIR Right 08/03/2019   Procedure: MINIMALLY INVASIVE MITRAL VALVE REPAIR (MVR) using Memo 4D 38 MM Mitral Valve.;  Surgeon: Gardenia Jump, MD;  Location: [facility] OR;  Service: Open Heart Surgery;  Laterality: Right;   REPAIR OF PATENT FORAMEN OVALE N/A 08/03/2019   Procedure: CLOSURE OF PATENT FORAMEN OVALE;  Surgeon: Gardenia Jump, MD;  Location: [facility] OR;  Service: Open Heart Surgery;  Laterality: N/A;   RETINAL TEAR REPAIR CRYOTHERAPY Left ~ 1997   RIGHT/LEFT HEART CATH AND CORONARY ANGIOGRAPHY N/A 05/26/2019   Procedure: RIGHT/LEFT HEART CATH AND CORONARY ANGIOGRAPHY;  Surgeon: Lucendia Rusk, MD;  Location: [facility] INVASIVE CV LAB;  Service: Cardiovascular;  Laterality: N/A;   TEE WITHOUT CARDIOVERSION N/A 05/26/2019   Procedure: TRANSESOPHAGEAL ECHOCARDIOGRAM (TEE);  Surgeon: Loyde Rule, MD;  Location: [facility] ENDOSCOPY;  Service: Cardiovascular;  Laterality: N/A;   TEE WITHOUT CARDIOVERSION N/A 08/03/2019   Procedure: TRANSESOPHAGEAL ECHOCARDIOGRAM (TEE);  Surgeon: Gardenia Jump, MD;  Location: [facility] OR;  Service: Open Heart Surgery;  Laterality: N/A;   TONSILLECTOMY  ~ 1952   TOTAL KNEE ARTHROPLASTY Right 09/21/2016    Procedure: RIGHT TOTAL KNEE ARTHROPLASTY;  Surgeon: Liliane Rei, MD;  Location: [facility] ORS;  Service: Orthopedics;  Laterality: Right;  requests with abductor block   VARICOSE VEIN SURGERY Right 2000's   VASCULAR SURGERY     WRIST SURGERY Right 1975   "had a knot taken off" (04/19/2013)    REVIEW OF SYSTEMS:  A comprehensive review of systems was negative except for: Musculoskeletal: positive for back pain   PHYSICAL EXAMINATION: General appearance: alert, cooperative, and no distress Head: Normocephalic, without obvious abnormality, atraumatic Neck: no adenopathy, no JVD, supple, symmetrical, trachea midline, and thyroid  not enlarged, symmetric, no tenderness/mass/nodules Lymph nodes: Cervical, supraclavicular, and axillary nodes normal. Resp: clear to auscultation bilaterally Back: symmetric, no curvature. ROM normal. No CVA tenderness. Cardio: regular rate and rhythm, S1, S2 normal, no murmur, click, rub or gallop GI: soft, non-tender; bowel sounds normal; no masses,  no organomegaly Extremities: extremities normal, atraumatic, no cyanosis or edema  ECOG PERFORMANCE STATUS: 1 - Symptomatic but completely ambulatory  Blood pressure 116/64, pulse 84, temperature 97.7 F (36.5 C), temperature source Temporal, resp. rate 19, weight 175 lb 1.6 oz (79.4 kg), SpO2 96%.  LABORATORY DATA: Lab Results  Component Value Date   WBC 6.6 12/23/2023   HGB 14.6 12/23/2023   HCT 43.2 12/23/2023   MCV 88.2 12/23/2023   PLT 338 12/23/2023      Chemistry      Component Value Date/Time   NA 139 12/23/2023 1245   NA 140 05/12/2019 1243   K 4.3 12/23/2023 1245   CL 106 12/23/2023 1245   CO2 29 12/23/2023 1245   BUN 19 12/23/2023 1245   BUN 14 05/12/2019 1243   CREATININE 1.15 (H) 12/23/2023 1245   CREATININE 1.05 (H) 07/19/2019 1206      Component Value Date/Time   CALCIUM 9.5 12/23/2023 1245   ALKPHOS 66 12/23/2023 1245   AST 18 12/23/2023 1245   ALT 16 12/23/2023 1245   BILITOT  0.9 12/23/2023 1245       RADIOGRAPHIC STUDIES: CT Chest W Contrast Result Date: 12/25/2023 EXAM:  CT CHEST WITH IV CONTRAST INDICATION: Non-small cell lung cancer (NSCLC), non-metastatic, assess treatment response TECHNIQUE: Spiral CT scanning was performed through the chest after the patient received intravenous Omnipaque  300, 75 mL. COMPARISON: 12/22/2022. FINDINGS: The heart size is within normal limits. There is no thoracic aortic aneurysm. No filling defects are identified in the central pulmonary arteries. The thyroid  gland has a normal appearance. No hilar, mediastinal or axillary adenopathy is identified. There is no pleural or pericardial effusion. There are stable postsurgical changes from left lower lobe lobectomy. Scattered groundglass nodules are present in the right upper lobe which appear unchanged. There is also a 12 mm left apical groundglass nodule. No developing or enlarging masses are identified in either lung. A small sliding hiatal hernia is present. There are renal and hepatic cysts noted in the upper abdomen which do not require imaging follow-up. There is no acute fracture or bone destruction. IMPRESSION: 1. Stable examination showing no evidence of tumor recurrence or metastatic disease. 2. Stable groundglass nodules as described above. Please note that CT scanning at this site utilizes multiple dose reduction techniques, including automatic exposure control, adjustment of the MAA and/or KVP according to the patient's size, and use of iterative reconstruction technique. Electronically signed by: Buster Cash MD 12/25/2023 02:25 PM EDT RP Workstation: [id number]     ASSESSMENT AND PLAN: This is a very pleasant 78 years old white female diagnosed with a stage IA (T1c, N0, M0) non-small cell lung cancer, adenocarcinoma status post left lower lobectomy with lymph node dissection in May 2021 under the care of Dr. Luna Salinas.  She continues to have multiple bilateral groundglass  opacities that need close monitoring. The patient had repeat CT scan of the chest performed recently.  I personally and independently reviewed the scan and discussed the result with the patient today.  Her scan showed no concerning findings for disease recurrence or metastasis. Assessment and Plan    Stage I non-small cell lung cancer, adenocarcinoma Diagnosed in May 2021. Status post left lower lobectomy with lymph node dissection. Currently under observation since May 2021. Recent CT scan shows no evidence of tumor recurrence or metastatic disease. Ground glass nodule present, unchanged over time. - Schedule follow-up appointment in one year with repeat CT scan.   She was advised to call immediately if she has any other concerning symptoms in the interval. The  patient voices understanding of current disease status and treatment options and is in agreement with the current care plan.  All questions were answered. The patient knows to call the clinic with any problems, questions or concerns. We can certainly see the patient much sooner if necessary.  Disclaimer: This note was dictated with voice recognition software. Similar sounding words can inadvertently be transcribed and may not be corrected upon review.

## 2024-01-04 DIAGNOSIS — M545 Low back pain, unspecified: Secondary | ICD-10-CM | POA: Diagnosis not present

## 2024-01-04 DIAGNOSIS — M25571 Pain in right ankle and joints of right foot: Secondary | ICD-10-CM | POA: Diagnosis not present

## 2024-01-17 DIAGNOSIS — M2041 Other hammer toe(s) (acquired), right foot: Secondary | ICD-10-CM | POA: Diagnosis not present

## 2024-01-17 DIAGNOSIS — M2011 Hallux valgus (acquired), right foot: Secondary | ICD-10-CM | POA: Diagnosis not present

## 2024-01-17 DIAGNOSIS — M2042 Other hammer toe(s) (acquired), left foot: Secondary | ICD-10-CM | POA: Diagnosis not present

## 2024-01-17 DIAGNOSIS — M2142 Flat foot [pes planus] (acquired), left foot: Secondary | ICD-10-CM | POA: Diagnosis not present

## 2024-01-17 DIAGNOSIS — M2141 Flat foot [pes planus] (acquired), right foot: Secondary | ICD-10-CM | POA: Diagnosis not present

## 2024-01-17 DIAGNOSIS — M19072 Primary osteoarthritis, left ankle and foot: Secondary | ICD-10-CM | POA: Diagnosis not present

## 2024-01-17 DIAGNOSIS — M2012 Hallux valgus (acquired), left foot: Secondary | ICD-10-CM | POA: Diagnosis not present

## 2024-01-17 DIAGNOSIS — M65871 Other synovitis and tenosynovitis, right ankle and foot: Secondary | ICD-10-CM | POA: Diagnosis not present

## 2024-01-17 DIAGNOSIS — M76821 Posterior tibial tendinitis, right leg: Secondary | ICD-10-CM | POA: Diagnosis not present

## 2024-01-17 DIAGNOSIS — M19071 Primary osteoarthritis, right ankle and foot: Secondary | ICD-10-CM | POA: Diagnosis not present

## 2024-02-07 DIAGNOSIS — M2012 Hallux valgus (acquired), left foot: Secondary | ICD-10-CM | POA: Diagnosis not present

## 2024-02-07 DIAGNOSIS — M2011 Hallux valgus (acquired), right foot: Secondary | ICD-10-CM | POA: Diagnosis not present

## 2024-02-07 DIAGNOSIS — M76821 Posterior tibial tendinitis, right leg: Secondary | ICD-10-CM | POA: Diagnosis not present

## 2024-02-07 DIAGNOSIS — M19072 Primary osteoarthritis, left ankle and foot: Secondary | ICD-10-CM | POA: Diagnosis not present

## 2024-02-07 DIAGNOSIS — M2141 Flat foot [pes planus] (acquired), right foot: Secondary | ICD-10-CM | POA: Diagnosis not present

## 2024-02-07 DIAGNOSIS — M65871 Other synovitis and tenosynovitis, right ankle and foot: Secondary | ICD-10-CM | POA: Diagnosis not present

## 2024-02-07 DIAGNOSIS — M2042 Other hammer toe(s) (acquired), left foot: Secondary | ICD-10-CM | POA: Diagnosis not present

## 2024-02-07 DIAGNOSIS — M19071 Primary osteoarthritis, right ankle and foot: Secondary | ICD-10-CM | POA: Diagnosis not present

## 2024-02-07 DIAGNOSIS — M2142 Flat foot [pes planus] (acquired), left foot: Secondary | ICD-10-CM | POA: Diagnosis not present

## 2024-02-07 DIAGNOSIS — M2041 Other hammer toe(s) (acquired), right foot: Secondary | ICD-10-CM | POA: Diagnosis not present

## 2024-02-08 DIAGNOSIS — M545 Low back pain, unspecified: Secondary | ICD-10-CM | POA: Diagnosis not present

## 2024-02-08 DIAGNOSIS — M25561 Pain in right knee: Secondary | ICD-10-CM | POA: Diagnosis not present

## 2024-02-17 DIAGNOSIS — M79671 Pain in right foot: Secondary | ICD-10-CM | POA: Diagnosis not present

## 2024-02-17 DIAGNOSIS — M2141 Flat foot [pes planus] (acquired), right foot: Secondary | ICD-10-CM | POA: Diagnosis not present

## 2024-02-17 DIAGNOSIS — M76821 Posterior tibial tendinitis, right leg: Secondary | ICD-10-CM | POA: Diagnosis not present

## 2024-03-01 DIAGNOSIS — M79671 Pain in right foot: Secondary | ICD-10-CM | POA: Diagnosis not present

## 2024-03-06 DIAGNOSIS — M79671 Pain in right foot: Secondary | ICD-10-CM | POA: Diagnosis not present

## 2024-03-10 DIAGNOSIS — M2141 Flat foot [pes planus] (acquired), right foot: Secondary | ICD-10-CM | POA: Diagnosis not present

## 2024-03-10 DIAGNOSIS — Z96651 Presence of right artificial knee joint: Secondary | ICD-10-CM | POA: Diagnosis not present

## 2024-03-10 DIAGNOSIS — M2012 Hallux valgus (acquired), left foot: Secondary | ICD-10-CM | POA: Diagnosis not present

## 2024-03-10 DIAGNOSIS — M2042 Other hammer toe(s) (acquired), left foot: Secondary | ICD-10-CM | POA: Diagnosis not present

## 2024-03-10 DIAGNOSIS — M19072 Primary osteoarthritis, left ankle and foot: Secondary | ICD-10-CM | POA: Diagnosis not present

## 2024-03-10 DIAGNOSIS — M19071 Primary osteoarthritis, right ankle and foot: Secondary | ICD-10-CM | POA: Diagnosis not present

## 2024-03-10 DIAGNOSIS — M2142 Flat foot [pes planus] (acquired), left foot: Secondary | ICD-10-CM | POA: Diagnosis not present

## 2024-03-10 DIAGNOSIS — M65871 Other synovitis and tenosynovitis, right ankle and foot: Secondary | ICD-10-CM | POA: Diagnosis not present

## 2024-03-10 DIAGNOSIS — M2011 Hallux valgus (acquired), right foot: Secondary | ICD-10-CM | POA: Diagnosis not present

## 2024-03-10 DIAGNOSIS — M25562 Pain in left knee: Secondary | ICD-10-CM | POA: Diagnosis not present

## 2024-03-10 DIAGNOSIS — M79671 Pain in right foot: Secondary | ICD-10-CM | POA: Diagnosis not present

## 2024-03-10 DIAGNOSIS — M2041 Other hammer toe(s) (acquired), right foot: Secondary | ICD-10-CM | POA: Diagnosis not present

## 2024-03-10 DIAGNOSIS — M76821 Posterior tibial tendinitis, right leg: Secondary | ICD-10-CM | POA: Diagnosis not present

## 2024-03-14 DIAGNOSIS — M79671 Pain in right foot: Secondary | ICD-10-CM | POA: Diagnosis not present

## 2024-03-17 DIAGNOSIS — M79671 Pain in right foot: Secondary | ICD-10-CM | POA: Diagnosis not present

## 2024-03-21 DIAGNOSIS — M545 Low back pain, unspecified: Secondary | ICD-10-CM | POA: Diagnosis not present

## 2024-03-22 DIAGNOSIS — M79671 Pain in right foot: Secondary | ICD-10-CM | POA: Diagnosis not present

## 2024-03-24 DIAGNOSIS — M79671 Pain in right foot: Secondary | ICD-10-CM | POA: Diagnosis not present

## 2024-03-27 DIAGNOSIS — M79671 Pain in right foot: Secondary | ICD-10-CM | POA: Diagnosis not present

## 2024-03-28 DIAGNOSIS — H52203 Unspecified astigmatism, bilateral: Secondary | ICD-10-CM | POA: Diagnosis not present

## 2024-03-28 DIAGNOSIS — Z961 Presence of intraocular lens: Secondary | ICD-10-CM | POA: Diagnosis not present

## 2024-03-28 DIAGNOSIS — H10413 Chronic giant papillary conjunctivitis, bilateral: Secondary | ICD-10-CM | POA: Diagnosis not present

## 2024-03-28 DIAGNOSIS — H43813 Vitreous degeneration, bilateral: Secondary | ICD-10-CM | POA: Diagnosis not present

## 2024-04-04 DIAGNOSIS — M545 Low back pain, unspecified: Secondary | ICD-10-CM | POA: Diagnosis not present

## 2024-04-09 ENCOUNTER — Emergency Department (HOSPITAL_BASED_OUTPATIENT_CLINIC_OR_DEPARTMENT_OTHER): Admission: EM | Admit: 2024-04-09 | Discharge: 2024-04-09 | Disposition: A | Discharge status: Home / Self Care

## 2024-04-09 ENCOUNTER — Emergency Department (HOSPITAL_BASED_OUTPATIENT_CLINIC_OR_DEPARTMENT_OTHER)

## 2024-04-09 DIAGNOSIS — Z7982 Long term (current) use of aspirin: Secondary | ICD-10-CM | POA: Diagnosis not present

## 2024-04-09 DIAGNOSIS — M545 Low back pain, unspecified: Secondary | ICD-10-CM | POA: Insufficient documentation

## 2024-04-09 DIAGNOSIS — Z85118 Personal history of other malignant neoplasm of bronchus and lung: Secondary | ICD-10-CM | POA: Diagnosis not present

## 2024-04-09 DIAGNOSIS — M62830 Muscle spasm of back: Secondary | ICD-10-CM | POA: Diagnosis not present

## 2024-04-09 DIAGNOSIS — K769 Liver disease, unspecified: Secondary | ICD-10-CM | POA: Diagnosis not present

## 2024-04-09 DIAGNOSIS — N281 Cyst of kidney, acquired: Secondary | ICD-10-CM | POA: Diagnosis not present

## 2024-04-09 DIAGNOSIS — R109 Unspecified abdominal pain: Secondary | ICD-10-CM | POA: Diagnosis not present

## 2024-04-09 DIAGNOSIS — M62838 Other muscle spasm: Secondary | ICD-10-CM | POA: Insufficient documentation

## 2024-04-09 DIAGNOSIS — K449 Diaphragmatic hernia without obstruction or gangrene: Secondary | ICD-10-CM | POA: Diagnosis not present

## 2024-04-09 DIAGNOSIS — K5730 Diverticulosis of large intestine without perforation or abscess without bleeding: Secondary | ICD-10-CM | POA: Diagnosis not present

## 2024-04-09 LAB — COMPREHENSIVE METABOLIC PANEL WITH GFR
ALT: 19 U/L (ref 0–44)
AST: 22 U/L (ref 15–41)
Albumin: 4.1 g/dL (ref 3.5–5.0)
Alkaline Phosphatase: 68 U/L (ref 38–126)
Anion gap: 12 (ref 5–15)
BUN: 8 mg/dL (ref 8–23)
CO2: 23 mmol/L (ref 22–32)
Calcium: 9.4 mg/dL (ref 8.9–10.3)
Chloride: 105 mmol/L (ref 98–111)
Creatinine, Ser: 1.02 mg/dL — ABNORMAL HIGH (ref 0.44–1.00)
GFR, Estimated: 56 mL/min — ABNORMAL LOW (ref >60)
Glucose, Bld: 104 mg/dL — ABNORMAL HIGH (ref 70–99)
Potassium: 3.9 mmol/L (ref 3.5–5.1)
Sodium: 140 mmol/L (ref 135–145)
Total Bilirubin: 0.8 mg/dL (ref 0.0–1.2)
Total Protein: 7.1 g/dL (ref 6.5–8.1)

## 2024-04-09 LAB — CBC WITH DIFFERENTIAL/PLATELET
Abs Immature Granulocytes: 0.02 K/uL (ref 0.00–0.07)
Basophils Absolute: 0 K/uL (ref 0.0–0.1)
Basophils Relative: 1 %
Eosinophils Absolute: 0.1 K/uL (ref 0.0–0.5)
Eosinophils Relative: 3 %
HCT: 42.1 % (ref 36.0–46.0)
Hemoglobin: 14.1 g/dL (ref 12.0–15.0)
Immature Granulocytes: 1 %
Lymphocytes Relative: 18 %
Lymphs Abs: 0.8 K/uL (ref 0.7–4.0)
MCH: 29.6 pg (ref 26.0–34.0)
MCHC: 33.5 g/dL (ref 30.0–36.0)
MCV: 88.4 fL (ref 80.0–100.0)
Monocytes Absolute: 0.6 K/uL (ref 0.1–1.0)
Monocytes Relative: 15 %
Neutro Abs: 2.6 K/uL (ref 1.7–7.7)
Neutrophils Relative %: 62 %
Platelets: 329 K/uL (ref 150–400)
RBC: 4.76 MIL/uL (ref 3.87–5.11)
RDW: 12.5 % (ref 11.5–15.5)
WBC: 4.2 K/uL (ref 4.0–10.5)
nRBC: 0 % (ref 0.0–0.2)

## 2024-04-09 LAB — LIPASE, BLOOD: Lipase: 33 U/L (ref 11–51)

## 2024-04-09 MED ORDER — LIDOCAINE 5 % EX PTCH: 1.0000 | MEDICATED_PATCH | CUTANEOUS | 0 refills | Status: AC

## 2024-04-09 MED ORDER — ACETAMINOPHEN 500 MG PO TABS
1000.0000 mg | ORAL_TABLET | Freq: Once | ORAL | Status: AC
Start: 2024-04-09 — End: 2024-04-09
  Administered 2024-04-09: 1000 mg via ORAL
  Filled 2024-04-09: qty 2

## 2024-04-09 MED ORDER — CYCLOBENZAPRINE HCL 10 MG PO TABS: 5.0000 mg | ORAL_TABLET | Freq: Every evening | ORAL | 0 refills | Status: DC | PRN

## 2024-04-09 MED ORDER — LIDOCAINE 5 % EX PTCH
1.0000 | MEDICATED_PATCH | CUTANEOUS | Status: DC
Start: 2024-04-09 — End: 2024-04-09
  Administered 2024-04-09: 1 via TRANSDERMAL
  Filled 2024-04-09: qty 1

## 2024-04-09 NOTE — Discharge Instructions (Addendum)
 For pain, you can take 1000 mg of Tylenol  or 1 g of Tylenol  every 6-8 hours.  Do not exceed more than 4000 mg or 4 g in a 24-hour period.  You can also use the lidocaine  patches.   You can take the flexeril  at night. Do not take this and drive.   Please follow up with physical therapy

## 2024-04-09 NOTE — ED Notes (Signed)
 Patient transported to CT

## 2024-04-09 NOTE — ED Provider Notes (Signed)
 [place] EMERGENCY DEPARTMENT AT [facility] Provider Note   CSN: [id number] Arrival date & time: 04/09/24  9076     Patient presents with: Back Pain   Anne Hogan is a 78 y.o. female.   HPI     Patient presents with left lower back pain/flank pain has been going on for the past 2 days.  Patient has not had no  Bowel bladder incontinence.  No saddle anesthesia.  Patient states whenever she sits down in her bed at night to try and rest, she will start having worsening pain.  Seems to hurt with certain positions while sitting there.  Took Aleve last night but otherwise not taking thing for pain.  No dysuria.  No hematuria.  No history of kidney stones in the past.  No bowel or bladder incontinence.  No saddle anesthesia.  Otherwise, denies all complaints.  Recently diagnosed with COVID-19 on Friday.  Currently on paxlovid.   No chest pain or shortness of breath.  Previous medical history reviewed : Follows up with oncology.  Last note was made in June 2025.  History of non-small lung cancer adenocarcinoma.  Status post left lower lobectomy.  Has been on observation since then.  Mentioned lower back pain during this visit.   Prior to Admission medications   Medication Sig Start Date End Date Taking? Authorizing Provider  cyclobenzaprine  (FLEXERIL ) 10 MG tablet Take 0.5 tablets (5 mg total) by mouth at bedtime as needed for muscle spasms. 04/09/24  Yes Simon Lavonia SAILOR, MD  lidocaine  (LIDODERM ) 5 % Place 1 patch onto the skin daily. Remove & Discard patch within 12 hours or as directed by MD 04/09/24  Yes Simon Lavonia SAILOR, MD  acetaminophen  (TYLENOL ) 500 MG tablet Take 500-1,000 mg by mouth every 6 (six) hours as needed for moderate pain (pain).     [provider]  aspirin  EC 81 MG EC tablet Take 1 tablet (81 mg total) by mouth daily. 08/08/19   Dwan Kyla HERO, PA-C  BREO ELLIPTA  100-25 MCG/INH AEPB USE 1 PUFF DAILY AS DIRECTED 02/16/20   Mannam, Praveen, MD   doxycycline  (VIBRAMYCIN ) 100 MG capsule Take 1 capsule (100 mg total) one hour prior to any dental procedure 06/19/22   Camnitz, Soyla Lunger, MD  montelukast  (SINGULAIR ) 10 MG tablet Take 10 mg by mouth daily as needed (respiratory issues.).    [provider]  Multiple Vitamins-Minerals (CENTRUM SILVER 50+WOMEN PO) Take 1 tablet by mouth daily.    [provider]  pantoprazole  (PROTONIX ) 40 MG tablet Take 40 mg by mouth at bedtime.    [provider]  polyethylene glycol (MIRALAX  / GLYCOLAX ) 17 g packet Take 17 g by mouth daily.    [provider]    Allergies: Penicillins, Codeine, Milk-related compounds, and Morphine  and codeine    Review of Systems  Constitutional:  Negative for chills and fever.  HENT:  Negative for ear pain and sore throat.   Eyes:  Negative for pain and visual disturbance.  Respiratory:  Negative for cough and shortness of breath.   Cardiovascular:  Negative for chest pain and palpitations.  Gastrointestinal:  Negative for abdominal pain and vomiting.  Genitourinary:  Negative for dysuria and hematuria.  Musculoskeletal:  Negative for arthralgias and back pain.  Skin:  Negative for color change and rash.  Neurological:  Negative for seizures and syncope.  All other systems reviewed and are negative.   Updated Vital Signs BP (!) 148/73 (BP Location: Right Arm)  Pulse 75   Temp 98.6 F (37 C)   Resp 14   SpO2 98%   Physical Exam Vitals and nursing note reviewed.  Constitutional:      General: She is not in acute distress.    Appearance: She is well-developed.  HENT:     Head: Normocephalic and atraumatic.  Eyes:     Conjunctiva/sclera: Conjunctivae normal.  Cardiovascular:     Rate and Rhythm: Normal rate and regular rhythm.     Heart sounds: No murmur heard. Pulmonary:     Effort: Pulmonary effort is normal. No respiratory distress.     Breath sounds: Normal breath sounds.  Abdominal:     Palpations: Abdomen  is soft.     Tenderness: There is no abdominal tenderness.  Musculoskeletal:        General: No swelling.       Arms:     Cervical back: Neck supple.  Skin:    General: Skin is warm and dry.     Capillary Refill: Capillary refill takes less than 2 seconds.  Neurological:     Mental Status: She is alert.  Psychiatric:        Mood and Affect: Mood normal.     (all labs ordered are listed, but only abnormal results are displayed) Labs Reviewed  COMPREHENSIVE METABOLIC PANEL WITH GFR - Abnormal; Notable for the following components:      Result Value   Glucose, Bld 104 (*)    Creatinine, Ser 1.02 (*)    GFR, Estimated 56 (*)    All other components within normal limits  CBC WITH DIFFERENTIAL/PLATELET  LIPASE, BLOOD  URINALYSIS, ROUTINE W REFLEX MICROSCOPIC    EKG: None  Radiology: CT Renal Stone Study Result Date: 04/09/2024 CLINICAL DATA:  Left lower back pain radiating to the left lower quadrant for 2-3 days. EXAM: CT ABDOMEN AND PELVIS WITHOUT CONTRAST TECHNIQUE: Multidetector CT imaging of the abdomen and pelvis was performed following the standard protocol without IV contrast. RADIATION DOSE REDUCTION: This exam was performed according to the departmental dose-optimization program which includes automated exposure control, adjustment of the mA and/or kV according to patient size and/or use of iterative reconstruction technique. COMPARISON:  CT chest 12/23/2023 FINDINGS: Lower chest: Partially visualized left hemidiaphragm elevation in this patient with a history left lower lobectomy. Unchanged calcified granuloma in the basilar right lower lobe. No pleural effusion. Hepatobiliary: Unchanged small low-density lesions in the liver compatible with cysts. Status post cholecystectomy. No biliary dilatation. Pancreas: Unremarkable. Spleen: Unremarkable. Adrenals/Urinary Tract: Unremarkable adrenal glands. Mild left renal scarring. Unchanged 5.5 cm right renal cyst for which no follow-up  imaging is required. No renal calculi or hydronephrosis. No evidence of ureteral calculi or ureteral dilatation. Unremarkable bladder. Stomach/Bowel: Small sliding hiatal hernia. Appendectomy. Left-sided colonic diverticulosis. No evidence of bowel obstruction or acute inflammation. Vascular/Lymphatic: Mild abdominal aortic atherosclerosis without aneurysm. No enlarged lymph nodes. Reproductive: Uterus and bilateral adnexa are unremarkable. Other: No ascites or pneumoperitoneum. Musculoskeletal: No suspicious bone lesion. Moderate disc degeneration at L2-3 and L4-5. Moderately advanced lower lumbar facet arthrosis. IMPRESSION: 1. No acute abnormality identified in the abdomen or pelvis. 2.  Aortic Atherosclerosis (ICD10-I70.0). Electronically Signed   By: Dasie Hamburg M.D.   On: 04/09/2024 10:56     Procedures   Medications Ordered in the ED  lidocaine  (LIDODERM ) 5 % 1 patch (1 patch Transdermal Patch Applied 04/09/24 1219)  acetaminophen  (TYLENOL ) tablet 1,000 mg (1,000 mg Oral Given 04/09/24 1218)  Medical Decision Making Amount and/or Complexity of Data Reviewed Labs: ordered. Radiology: ordered.  Risk OTC drugs. Prescription drug management.     Patient presents with left lower back pain/flank pain has been going on for the past 2 days.  Patient has not had Bowel bladder incontinence.  No saddle anesthesia.  Patient states whenever she sits down in her bed at night to try and rest, she will start having worsening pain.  Seems to hurt with certain positions while sitting there.  Took Aleve last night but otherwise not taking thing for pain.  No dysuria.  No hematuria.  No history of kidney stones in the past.  No bowel or bladder incontinence.  No saddle anesthesia.  Otherwise, denies all complaints.  Recently diagnosed with COVID-19 on Friday.  Currently on paxlovid.   No chest pain or shortness of breath.  Previous medical history reviewed : Follows up  with oncology.  Last note was made in June 2025.  History of non-small lung cancer adenocarcinoma.  Status post left lower lobectomy.  Has been on observation since then.  Mentioned lower back pain during this visit.  Upon exam, patient medically stable.  ANO x 3 GCS 15.  Afebrile.   Patient has reproducible pain left lower back.  Seems to be consistent for pain at one of the erector spinae muscles.  May have some mild CVA tenderness but I think this is likely because of more muscle skeletal pain.  Nevertheless, we will obtain urine as well as CT scan renal stone protocol.  No leukocytosis.  No electrolyte derangements.  Did obtain CT scan renal stone study.  Unremarkable.  No nephrolithiasis  Reexamined the patient.  Reproducible pain along this left side.  Once again, think is musculoskeletal.  Discussed staying for UA which patient does not want to which I think is reasonable.  No concerns for pyelonephritis.  Not consistent for UTI based on location  Recommended symptomatic management.  Follow-up with PT.  Once again, no bowel or bladder incontinence.  No concerns for central  cord syndrome such as cauda equina    Final diagnoses:  Acute left-sided low back pain without sciatica  Muscle spasm    ED Discharge Orders          Ordered    cyclobenzaprine  (FLEXERIL ) 10 MG tablet  At bedtime PRN        04/09/24 1214    lidocaine  (LIDODERM ) 5 %  Every 24 hours        04/09/24 1214               Simon Lavonia SAILOR, MD 04/09/24 1448

## 2024-04-09 NOTE — ED Triage Notes (Signed)
 Pt reports left lower back pain that radiates to her LLQ for 2-3 days.  Denies any urinary symptoms, n/v/d.   PT covid + test on Friday with symptoms of sneezing/runny nose that began 1 week prior.  Denies any respiratory concerns at present.

## 2024-05-03 DIAGNOSIS — M2142 Flat foot [pes planus] (acquired), left foot: Secondary | ICD-10-CM | POA: Diagnosis not present

## 2024-05-03 DIAGNOSIS — M65871 Other synovitis and tenosynovitis, right ankle and foot: Secondary | ICD-10-CM | POA: Diagnosis not present

## 2024-05-03 DIAGNOSIS — M2011 Hallux valgus (acquired), right foot: Secondary | ICD-10-CM | POA: Diagnosis not present

## 2024-05-03 DIAGNOSIS — M2012 Hallux valgus (acquired), left foot: Secondary | ICD-10-CM | POA: Diagnosis not present

## 2024-05-03 DIAGNOSIS — M2042 Other hammer toe(s) (acquired), left foot: Secondary | ICD-10-CM | POA: Diagnosis not present

## 2024-05-03 DIAGNOSIS — M19071 Primary osteoarthritis, right ankle and foot: Secondary | ICD-10-CM | POA: Diagnosis not present

## 2024-05-03 DIAGNOSIS — M19072 Primary osteoarthritis, left ankle and foot: Secondary | ICD-10-CM | POA: Diagnosis not present

## 2024-05-03 DIAGNOSIS — M2141 Flat foot [pes planus] (acquired), right foot: Secondary | ICD-10-CM | POA: Diagnosis not present

## 2024-05-03 DIAGNOSIS — M2041 Other hammer toe(s) (acquired), right foot: Secondary | ICD-10-CM | POA: Diagnosis not present

## 2024-05-03 DIAGNOSIS — M76821 Posterior tibial tendinitis, right leg: Secondary | ICD-10-CM | POA: Diagnosis not present

## 2024-05-09 ENCOUNTER — Observation Stay (HOSPITAL_COMMUNITY)

## 2024-05-09 ENCOUNTER — Encounter (HOSPITAL_COMMUNITY): Payer: Self-pay | Admitting: Family Medicine

## 2024-05-09 ENCOUNTER — Other Ambulatory Visit: Payer: Self-pay

## 2024-05-09 ENCOUNTER — Observation Stay (HOSPITAL_BASED_OUTPATIENT_CLINIC_OR_DEPARTMENT_OTHER)
Admission: EM | Admit: 2024-05-09 | Discharge: 2024-05-10 | Disposition: A | Discharge status: Home / Self Care | Attending: Family Medicine | Admitting: Family Medicine

## 2024-05-09 ENCOUNTER — Emergency Department (HOSPITAL_BASED_OUTPATIENT_CLINIC_OR_DEPARTMENT_OTHER)

## 2024-05-09 DIAGNOSIS — R29818 Other symptoms and signs involving the nervous system: Secondary | ICD-10-CM | POA: Diagnosis not present

## 2024-05-09 DIAGNOSIS — J45909 Unspecified asthma, uncomplicated: Secondary | ICD-10-CM | POA: Diagnosis not present

## 2024-05-09 DIAGNOSIS — Z87891 Personal history of nicotine dependence: Secondary | ICD-10-CM | POA: Insufficient documentation

## 2024-05-09 DIAGNOSIS — N1831 Chronic kidney disease, stage 3a: Secondary | ICD-10-CM | POA: Diagnosis not present

## 2024-05-09 DIAGNOSIS — Z7982 Long term (current) use of aspirin: Secondary | ICD-10-CM | POA: Diagnosis not present

## 2024-05-09 DIAGNOSIS — Z7901 Long term (current) use of anticoagulants: Secondary | ICD-10-CM | POA: Insufficient documentation

## 2024-05-09 DIAGNOSIS — I6782 Cerebral ischemia: Secondary | ICD-10-CM | POA: Diagnosis not present

## 2024-05-09 DIAGNOSIS — Z8669 Personal history of other diseases of the nervous system and sense organs: Secondary | ICD-10-CM

## 2024-05-09 DIAGNOSIS — E785 Hyperlipidemia, unspecified: Secondary | ICD-10-CM | POA: Diagnosis not present

## 2024-05-09 DIAGNOSIS — R4701 Aphasia: Secondary | ICD-10-CM | POA: Diagnosis not present

## 2024-05-09 DIAGNOSIS — Z79899 Other long term (current) drug therapy: Secondary | ICD-10-CM | POA: Insufficient documentation

## 2024-05-09 DIAGNOSIS — Z96651 Presence of right artificial knee joint: Secondary | ICD-10-CM | POA: Diagnosis not present

## 2024-05-09 DIAGNOSIS — G4733 Obstructive sleep apnea (adult) (pediatric): Secondary | ICD-10-CM | POA: Insufficient documentation

## 2024-05-09 DIAGNOSIS — Z902 Acquired absence of lung [part of]: Secondary | ICD-10-CM | POA: Diagnosis not present

## 2024-05-09 DIAGNOSIS — C3492 Malignant neoplasm of unspecified part of left bronchus or lung: Secondary | ICD-10-CM | POA: Diagnosis not present

## 2024-05-09 DIAGNOSIS — G459 Transient cerebral ischemic attack, unspecified: Principal | ICD-10-CM | POA: Insufficient documentation

## 2024-05-09 DIAGNOSIS — Z85118 Personal history of other malignant neoplasm of bronchus and lung: Secondary | ICD-10-CM | POA: Insufficient documentation

## 2024-05-09 DIAGNOSIS — Z743 Need for continuous supervision: Secondary | ICD-10-CM | POA: Diagnosis not present

## 2024-05-09 DIAGNOSIS — I5032 Chronic diastolic (congestive) heart failure: Secondary | ICD-10-CM | POA: Diagnosis not present

## 2024-05-09 DIAGNOSIS — I130 Hypertensive heart and chronic kidney disease with heart failure and stage 1 through stage 4 chronic kidney disease, or unspecified chronic kidney disease: Secondary | ICD-10-CM | POA: Insufficient documentation

## 2024-05-09 DIAGNOSIS — K219 Gastro-esophageal reflux disease without esophagitis: Secondary | ICD-10-CM | POA: Diagnosis not present

## 2024-05-09 DIAGNOSIS — R9431 Abnormal electrocardiogram [ECG] [EKG]: Secondary | ICD-10-CM | POA: Diagnosis not present

## 2024-05-09 DIAGNOSIS — R9089 Other abnormal findings on diagnostic imaging of central nervous system: Secondary | ICD-10-CM | POA: Diagnosis not present

## 2024-05-09 LAB — DIFFERENTIAL
Abs Immature Granulocytes: 0.02 K/uL (ref 0.00–0.07)
Basophils Absolute: 0.1 K/uL (ref 0.0–0.1)
Basophils Relative: 1 %
Eosinophils Absolute: 0.3 K/uL (ref 0.0–0.5)
Eosinophils Relative: 4 %
Immature Granulocytes: 0 %
Lymphocytes Relative: 26 %
Lymphs Abs: 1.8 K/uL (ref 0.7–4.0)
Monocytes Absolute: 0.8 K/uL (ref 0.1–1.0)
Monocytes Relative: 12 %
Neutro Abs: 4 K/uL (ref 1.7–7.7)
Neutrophils Relative %: 57 %

## 2024-05-09 LAB — COMPREHENSIVE METABOLIC PANEL WITH GFR
ALT: 23 U/L (ref 0–44)
AST: 22 U/L (ref 15–41)
Albumin: 4.2 g/dL (ref 3.5–5.0)
Alkaline Phosphatase: 81 U/L (ref 38–126)
Anion gap: 12 (ref 5–15)
BUN: 14 mg/dL (ref 8–23)
CO2: 23 mmol/L (ref 22–32)
Calcium: 9.7 mg/dL (ref 8.9–10.3)
Chloride: 106 mmol/L (ref 98–111)
Creatinine, Ser: 1.09 mg/dL — ABNORMAL HIGH (ref 0.44–1.00)
GFR, Estimated: 52 mL/min — ABNORMAL LOW (ref >60)
Glucose, Bld: 112 mg/dL — ABNORMAL HIGH (ref 70–99)
Potassium: 4.2 mmol/L (ref 3.5–5.1)
Sodium: 141 mmol/L (ref 135–145)
Total Bilirubin: 0.7 mg/dL (ref 0.0–1.2)
Total Protein: 7.2 g/dL (ref 6.5–8.1)

## 2024-05-09 LAB — CBC
HCT: 42.3 % (ref 36.0–46.0)
Hemoglobin: 14.1 g/dL (ref 12.0–15.0)
MCH: 29.8 pg (ref 26.0–34.0)
MCHC: 33.3 g/dL (ref 30.0–36.0)
MCV: 89.4 fL (ref 80.0–100.0)
Platelets: 332 K/uL (ref 150–400)
RBC: 4.73 MIL/uL (ref 3.87–5.11)
RDW: 12.6 % (ref 11.5–15.5)
WBC: 6.9 K/uL (ref 4.0–10.5)
nRBC: 0 % (ref 0.0–0.2)

## 2024-05-09 LAB — APTT: aPTT: 35 s (ref 24–36)

## 2024-05-09 LAB — PROTIME-INR
INR: 0.9 (ref 0.8–1.2)
Prothrombin Time: 12.9 s (ref 11.4–15.2)

## 2024-05-09 LAB — CBG MONITORING, ED: Glucose-Capillary: 109 mg/dL — ABNORMAL HIGH (ref 70–99)

## 2024-05-09 LAB — ETHANOL: Alcohol, Ethyl (B): <15 mg/dL (ref <15)

## 2024-05-09 MED ORDER — SODIUM CHLORIDE 0.9 % IV SOLN: INTRAVENOUS | Status: DC

## 2024-05-09 MED ORDER — SODIUM CHLORIDE 0.9% FLUSH: 3.0000 mL | Freq: Once | INTRAVENOUS | Status: DC

## 2024-05-09 MED ORDER — CLOPIDOGREL BISULFATE 75 MG PO TABS
75.0000 mg | ORAL_TABLET | Freq: Every day | ORAL | Status: DC
  Administered 2024-05-10: 75 mg via ORAL
  Filled 2024-05-09: qty 1

## 2024-05-09 MED ORDER — INFLUENZA VAC SPLIT HIGH-DOSE 0.5 ML IM SUSY
0.5000 mL | PREFILLED_SYRINGE | INTRAMUSCULAR | Status: DC
  Filled 2024-05-09: qty 0.5

## 2024-05-09 MED ORDER — LORAZEPAM 2 MG/ML IJ SOLN
1.0000 mg | Freq: Once | INTRAMUSCULAR | Status: DC | PRN
Start: 2024-05-09 — End: 2024-05-10

## 2024-05-09 MED ORDER — SENNOSIDES-DOCUSATE SODIUM 8.6-50 MG PO TABS: 1.0000 | ORAL_TABLET | Freq: Every evening | ORAL | Status: DC | PRN

## 2024-05-09 MED ORDER — ASPIRIN 81 MG PO TBEC
81.0000 mg | DELAYED_RELEASE_TABLET | Freq: Every day | ORAL | Status: DC
  Administered 2024-05-10: 81 mg via ORAL
  Filled 2024-05-09: qty 1

## 2024-05-09 MED ORDER — STROKE: EARLY STAGES OF RECOVERY BOOK
Freq: Once | Status: AC
  Filled 2024-05-09: qty 1

## 2024-05-09 MED ORDER — MONTELUKAST SODIUM 10 MG PO TABS
10.0000 mg | ORAL_TABLET | Freq: Every day | ORAL | Status: DC
  Administered 2024-05-09: 10 mg via ORAL
  Filled 2024-05-09: qty 1

## 2024-05-09 MED ORDER — CLOPIDOGREL BISULFATE 75 MG PO TABS
75.0000 mg | ORAL_TABLET | Freq: Once | ORAL | Status: AC
  Administered 2024-05-09: 75 mg via ORAL
  Filled 2024-05-09: qty 1

## 2024-05-09 MED ORDER — ENOXAPARIN SODIUM 40 MG/0.4ML IJ SOSY
40.0000 mg | PREFILLED_SYRINGE | INTRAMUSCULAR | Status: DC
  Administered 2024-05-09: 40 mg via SUBCUTANEOUS
  Filled 2024-05-09: qty 0.4

## 2024-05-09 MED ORDER — ACETAMINOPHEN 160 MG/5ML PO SOLN: 650.0000 mg | ORAL | Status: DC | PRN

## 2024-05-09 MED ORDER — PANTOPRAZOLE SODIUM 40 MG PO TBEC: 40.0000 mg | DELAYED_RELEASE_TABLET | Freq: Every day | ORAL | Status: DC

## 2024-05-09 MED ORDER — ACETAMINOPHEN 650 MG RE SUPP: 650.0000 mg | RECTAL | Status: DC | PRN

## 2024-05-09 MED ORDER — IOHEXOL 350 MG/ML SOLN
75.0000 mL | Freq: Once | INTRAVENOUS | Status: AC | PRN
Start: 2024-05-09 — End: 2024-05-09
  Administered 2024-05-09: 75 mL via INTRAVENOUS

## 2024-05-09 MED ORDER — ACETAMINOPHEN 325 MG PO TABS: 650.0000 mg | ORAL_TABLET | ORAL | Status: DC | PRN

## 2024-05-09 MED ORDER — FLUTICASONE FUROATE-VILANTEROL 100-25 MCG/ACT IN AEPB
1.0000 | INHALATION_SPRAY | Freq: Every day | RESPIRATORY_TRACT | Status: DC
  Filled 2024-05-09: qty 28

## 2024-05-09 NOTE — Plan of Care (Signed)
  Problem: Education: Goal: Knowledge of General Education information will improve Description: Including pain rating scale, medication(s)/side effects and non-pharmacologic comfort measures Outcome: Progressing   Problem: Clinical Measurements: Goal: Ability to maintain clinical measurements within normal limits will improve Outcome: Progressing   Problem: Activity: Goal: Risk for activity intolerance will decrease Outcome: Progressing   Problem: Elimination: Goal: Will not experience complications related to bowel motility Outcome: Progressing Goal: Will not experience complications related to urinary retention Outcome: Progressing   Problem: Pain Managment: Goal: General experience of comfort will improve and/or be controlled Outcome: Progressing   Problem: Education: Goal: Knowledge of disease or condition will improve Outcome: Progressing   Problem: Coping: Goal: Will verbalize positive feelings about self Outcome: Progressing   Problem: Health Behavior/Discharge Planning: Goal: Ability to manage health-related needs will improve Outcome: Progressing   Problem: Self-Care: Goal: Ability to participate in self-care as condition permits will improve Outcome: Progressing   Problem: Nutrition: Goal: Risk of aspiration will decrease Outcome: Progressing Goal: Dietary intake will improve Outcome: Progressing

## 2024-05-09 NOTE — Plan of Care (Signed)
 Plan of Care Note for accepted transfer   Patient: Anne Hogan MRN: [id number]   DOA: 05/09/2024  Facility requesting transfer: [facility] Requesting Provider: Dr. Simon Reason for transfer: aphasia Facility course:  Anne Hogan is a 78 yo female with PMH CHF, depression, arthritis, ADD, MVP, sleep apnea who [facility] with [facility]asia having started around 2:30 PM.  Symptoms had resolved at time of triage.  Already on aspirin  daily at home. CT head unremarkable. Case discussed with neurology from [facility] and recommended to start on Plavix and transfer for further TIA workup.   Plan of care: The patient is accepted for admission to Telemetry unit, at [facility].  Author: Alm Apo, MD 05/09/2024 5:15 PM   **Nursing staff**, Please call [facility] Admits & Consults System-Wide number on Amion as soon as patient's arrival, so appropriate admitting provider can evaluate the pt.  Check [email or web address] for on-call coverage

## 2024-05-09 NOTE — ED Notes (Signed)
 Called [facility] to transport the patient to [place] 3W rm#[id number]

## 2024-05-09 NOTE — ED Triage Notes (Signed)
 Reports aphasia starting around 1430 today. Symptoms have resolved at time of triage. No deficits.

## 2024-05-09 NOTE — H&P (Signed)
 History and Physical    Anne Hogan FMW:[id number] DOB: June 27, 1946 DOA: 05/09/2024  PCP: Janey Santos, MD   Patient coming from: Home   Chief Complaint: Speech difficulty   HPI: Anne Hogan is a 78 y.o. female with medical history significant for asthma, OSA, chronic HFpEF, CKD 3A, stage Ia lung cancer and additional pulmonary nodules status post left lower lobectomy, and history of brief postoperative atrial fibrillation in 2021 who presents after an episode of speech difficulty.  Patient was in her usual state of health at roughly 2:30 PM when she was having difficulty speaking for 5 to 10 minutes.  She felt as though she could not pronounce words appropriately and may have had some word finding difficulty as well.  She denies any associated headache, focal numbness, or focal weakness.  [facility] ED Course: Upon arrival to the ED, patient is found to be afebrile and saturating well on room air with normal HR and stable BP.  Labs are most notable for creatinine 1.09, normal WBC, normal hemoglobin, and undetectable ethanol.  There were no acute findings on head CT.  Neurology was consulted by the ED physician and the patient was transferred to [facility] where she had CTA of the head and neck with no large vessel occlusion or hemodynamically significant stenosis, and then brain MRI with no acute intracranial abnormality. She passed the stroke swallow screen.   Review of Systems:  All other systems reviewed and apart from HPI, are negative.  Past Medical History:  Diagnosis Date   ADD (attention deficit disorder)    Arthritis    right knee (04/19/2013)   Asthma 04/03/2019   CHF (congestive heart failure) (HCC)    Chronic diastolic congestive heart failure (HCC)    Claustrophobia    Depression    Dyspnea    Dysrhythmia    Fracture of right tibial plateau 05/2016   GERD (gastroesophageal reflux disease)    Heart murmur    Incidental pulmonary nodule,  greater than or equal to 8mm 06/06/2019   Needs f/u CT in 3 months   Migraine    bad when I was in 5th or 6th grade; I can usually ward them off by resting my head now (04/19/2013)   MVP (mitral valve prolapse)    S/P minimally-invasive mitral valve repair 08/03/2019   Complex valvuloplasty including artificial Gore-tex neochords x12 and Sorin Memo 4D ring annuloplasty, size 38 mm   S/P patent foramen ovale closure 08/03/2019   Sleep apnea    has not used mouth guard in 5 years no cpap used    Squamous carcinoma ?1999   upper left groin (04/19/2013)    Past Surgical History:  Procedure Laterality Date   CARDIAC CATHETERIZATION  05/26/2019   CHOLECYSTECTOMY  04/18/2013   CHOLECYSTECTOMY N/A 04/18/2013   Procedure: LAPAROSCOPIC CHOLECYSTECTOMY WITH INTRAOPERATIVE CHOLANGIOGRAM;  Surgeon: Lynda Leos, MD;  Location: [facility] OR;  Service: General;  Laterality: N/A;   COLONOSCOPY W/ POLYPECTOMY     EYE SURGERY Bilateral FALL  2016   IOC FOR CATARACTS   FRACTURE SURGERY     INTERCOSTAL NERVE BLOCK Left 11/23/2019   Procedure: Intercostal Nerve Block;  Surgeon: Kerrin Elspeth BROCKS, MD;  Location: [facility] OR;  Service: Thoracic;  Laterality: Left;   JOINT REPLACEMENT     KNEE ARTHROSCOPY Right 12/2007   meniscus repair (04/19/2013)   LYMPH NODE DISSECTION Left 11/23/2019   Procedure: Lymph Node Dissection;  Surgeon: Kerrin Elspeth BROCKS, MD;  Location: [facility] OR;  Service: Thoracic;  Laterality: Left;   MITRAL VALVE REPAIR Right 08/03/2019   Procedure: MINIMALLY INVASIVE MITRAL VALVE REPAIR (MVR) using Memo 4D 38 MM Mitral Valve.;  Surgeon: Dusty Sudie DEL, MD;  Location: [facility] OR;  Service: Open Heart Surgery;  Laterality: Right;   REPAIR OF PATENT FORAMEN OVALE N/A 08/03/2019   Procedure: CLOSURE OF PATENT FORAMEN OVALE;  Surgeon: Dusty Sudie DEL, MD;  Location: [facility] OR;  Service: Open Heart Surgery;  Laterality: N/A;   RETINAL TEAR REPAIR CRYOTHERAPY Left ~ 1997   RIGHT/LEFT HEART CATH AND CORONARY  ANGIOGRAPHY N/A 05/26/2019   Procedure: RIGHT/LEFT HEART CATH AND CORONARY ANGIOGRAPHY;  Surgeon: Dann Candyce RAMAN, MD;  Location: [facility] INVASIVE CV LAB;  Service: Cardiovascular;  Laterality: N/A;   TEE WITHOUT CARDIOVERSION N/A 05/26/2019   Procedure: TRANSESOPHAGEAL ECHOCARDIOGRAM (TEE);  Surgeon: Delford Maude BROCKS, MD;  Location: [facility] ENDOSCOPY;  Service: Cardiovascular;  Laterality: N/A;   TEE WITHOUT CARDIOVERSION N/A 08/03/2019   Procedure: TRANSESOPHAGEAL ECHOCARDIOGRAM (TEE);  Surgeon: Dusty Sudie DEL, MD;  Location: [facility] OR;  Service: Open Heart Surgery;  Laterality: N/A;   TONSILLECTOMY  ~ 1952   TOTAL KNEE ARTHROPLASTY Right 09/21/2016   Procedure: RIGHT TOTAL KNEE ARTHROPLASTY;  Surgeon: Dempsey Moan, MD;  Location: [facility] ORS;  Service: Orthopedics;  Laterality: Right;  requests with abductor block   VARICOSE VEIN SURGERY Right 2000's   VASCULAR SURGERY     WRIST SURGERY Right 1975   had a knot taken off (04/19/2013)    Social History:   reports that she quit smoking about 37 years ago. Her smoking use included cigarettes. She started smoking about 57 years ago. She has a 30 pack-year smoking history. She has never used smokeless tobacco. She reports current alcohol  use. She reports that she does not use drugs.  Allergies  Allergen Reactions   Penicillins Rash    Has patient had a PCN reaction causing immediate rash, facial/tongue/throat swelling, SOB or lightheadedness with hypotension: No Has patient had a PCN reaction causing severe rash involving mucus membranes or skin necrosis: no Has patient had a PCN reaction that required hospitalization: No Has patient had a PCN reaction occurring within the last 10 years: no If all of the above answers are NO, then may proceed with Cephalosporin use.     Codeine Other (See Comments)    Possible sensitivity, patient not sure   Milk-Related Compounds Nausea And Vomiting and Other (See Comments)    Can have ice cream, pt just  doesn't want to drink milk   Morphine  And Codeine     strange sensation across midsection and chest    Family History  Problem Relation Age of Onset   Tremor Mother    Osteoporosis Mother    Sarcoidosis Mother    Hypertension Mother    Congestive Heart Failure Father    Heart attack Father    Diabetes Mellitus II Father    Diabetes Mellitus II Brother    Sleep apnea Neg Hx      Prior to Admission medications   Medication Sig Start Date End Date Taking? Authorizing Provider  acetaminophen  (TYLENOL ) 500 MG tablet Take 500-1,000 mg by mouth every 6 (six) hours as needed for moderate pain (pain).     [provider]  aspirin  EC 81 MG EC tablet Take 1 tablet (81 mg total) by mouth daily. 08/08/19   Dwan Kyla HERO, PA-C  BREO ELLIPTA  100-25 MCG/INH AEPB USE 1 PUFF DAILY AS DIRECTED 02/16/20   Mannam,  Praveen, MD  cyclobenzaprine  (FLEXERIL ) 10 MG tablet Take 0.5 tablets (5 mg total) by mouth at bedtime as needed for muscle spasms. 04/09/24   Simon Lavonia SAILOR, MD  doxycycline  (VIBRAMYCIN ) 100 MG capsule Take 1 capsule (100 mg total) one hour prior to any dental procedure 06/19/22   Camnitz, Soyla Lunger, MD  lidocaine  (LIDODERM ) 5 % Place 1 patch onto the skin daily. Remove & Discard patch within 12 hours or as directed by MD 04/09/24   Simon Lavonia SAILOR, MD  montelukast  (SINGULAIR ) 10 MG tablet Take 10 mg by mouth daily as needed (respiratory issues.).    [provider]  Multiple Vitamins-Minerals (CENTRUM SILVER 50+WOMEN PO) Take 1 tablet by mouth daily.    [provider]  pantoprazole  (PROTONIX ) 40 MG tablet Take 40 mg by mouth at bedtime.    [provider]    Physical Exam: Vitals:   05/09/24 1945 05/09/24 2020 05/09/24 2125 05/09/24 2355  BP: 131/74 (!) 150/75  (!) 148/79  Pulse: 70 74  69  Resp: 11 18  18   Temp: 98.9 F (37.2 C) (!) 97.5 F (36.4 C)  97.7 F (36.5 C)  TempSrc: Oral Oral  Oral  SpO2: 98% 99% 99% 96%  Weight:  74.5 kg     Height:  5' 2 (1.575 m)       Constitutional: NAD, calm  Eyes: PERTLA, lids and conjunctivae normal ENMT: Mucous membranes are moist. Posterior pharynx clear of any exudate or lesions.   Neck: supple, no masses  Respiratory: no wheezing, no crackles. No accessory muscle use.  Cardiovascular: S1 & S2 heard, regular rate and rhythm. No extremity edema.  Abdomen: No tenderness, soft. Bowel sounds active.  Musculoskeletal: no clubbing / cyanosis. No joint deformity upper and lower extremities.   Skin: no significant rashes, lesions, ulcers. Warm, dry, well-perfused. Neurologic: CN 2-12 grossly intact. Sensation to light touch intact. Strength 5/5 in all 4 limbs. Alert and oriented.  Psychiatric: Pleasant. Cooperative.    Labs and Imaging on Admission: I have personally reviewed following labs and imaging studies  CBC: Recent Labs  Lab 05/09/24 1508  WBC 6.9  NEUTROABS 4.0  HGB 14.1  HCT 42.3  MCV 89.4  PLT 332   Basic Metabolic Panel: Recent Labs  Lab 05/09/24 1508  NA 141  K 4.2  CL 106  CO2 23  GLUCOSE 112*  BUN 14  CREATININE 1.09*  CALCIUM 9.7   GFR: Estimated Creatinine Clearance: 40.2 mL/min (A) (by C-G formula based on SCr of 1.09 mg/dL (H)). Liver Function Tests: Recent Labs  Lab 05/09/24 1508  AST 22  ALT 23  ALKPHOS 81  BILITOT 0.7  PROT 7.2  ALBUMIN  4.2   No results for input(s): LIPASE, AMYLASE in the last 168 hours. No results for input(s): AMMONIA in the last 168 hours. Coagulation Profile: Recent Labs  Lab 05/09/24 1508  INR 0.9   Cardiac Enzymes: No results for input(s): CKTOTAL, CKMB, CKMBINDEX, TROPONINI in the last 168 hours. BNP (last 3 results) No results for input(s): PROBNP in the last 8760 hours. HbA1C: No results for input(s): HGBA1C in the last 72 hours. CBG: Recent Labs  Lab 05/09/24 1506  GLUCAP 109*   Lipid Profile: No results for input(s): CHOL, HDL, LDLCALC, TRIG, CHOLHDL,  LDLDIRECT in the last 72 hours. Thyroid  Function Tests: No results for input(s): TSH, T4TOTAL, FREET4, T3FREE, THYROIDAB in the last 72 hours. Anemia Panel: No results for input(s): VITAMINB12, FOLATE, FERRITIN, TIBC, IRON, RETICCTPCT in the  last 72 hours. Urine analysis:    Component Value Date/Time   COLORURINE YELLOW 11/20/2019 0934   APPEARANCEUR CLEAR 11/20/2019 0934   LABSPEC 1.011 11/20/2019 0934   PHURINE 5.0 11/20/2019 0934   GLUCOSEU NEGATIVE 11/20/2019 0934   HGBUR SMALL (A) 11/20/2019 0934   BILIRUBINUR NEGATIVE 11/20/2019 0934   KETONESUR NEGATIVE 11/20/2019 0934   PROTEINUR NEGATIVE 11/20/2019 0934   UROBILINOGEN 1.0 04/04/2013 2141   NITRITE NEGATIVE 11/20/2019 0934   LEUKOCYTESUR NEGATIVE 11/20/2019 0934   Sepsis Labs: @LABRCNTIP (procalcitonin:4,lacticidven:4) )No results found for this or any previous visit (from the past 240 hours).   Radiological Exams on Admission: MR BRAIN WO CONTRAST Result Date: 05/09/2024 EXAM: MRI BRAIN WITHOUT CONTRAST 05/09/2024 11:33:16 PM TECHNIQUE: Multiplanar multisequence MRI of the head/brain was performed without the administration of intravenous contrast. COMPARISON: 10/23/2015 CLINICAL HISTORY: Neuro deficit, acute, stroke suspected. FINDINGS: BRAIN AND VENTRICLES: No acute infarct. No intracranial hemorrhage. No mass. No midline shift. No hydrocephalus. Multifocal hyperintense T2-weighted signal within the cerebral white matter, most commonly due to chronic small vessel disease. Mild volume loss. The sella is unremarkable. Normal flow voids. ORBITS: No acute abnormality. SINUSES AND MASTOIDS: No acute abnormality. BONES AND SOFT TISSUES: Normal marrow signal. No acute soft tissue abnormality. IMPRESSION: 1. No acute intracranial abnormality. 2. Multifocal T2 hyperintense white matter signal, most consistent with chronic small vessel disease. Electronically signed by: Franky Stanford MD 05/09/2024 11:42 PM EDT RP  Workstation: [id number]   CT ANGIO HEAD NECK W WO CM Result Date: 05/09/2024 EXAM: CTA HEAD AND NECK WITH AND WITHOUT 05/09/2024 10:07:43 PM TECHNIQUE: CTA of the head and neck was performed with and without the administration of 75 mL of iohexol  (OMNIPAQUE ) 350 MG/ML injection. Multiplanar 2D and/or 3D reformatted images are provided for review. Automated exposure control, iterative reconstruction, and/or weight based adjustment of the mA/kV was utilized to reduce the radiation dose to as low as reasonably achievable. Stenosis of the internal carotid arteries measured using NASCET criteria. COMPARISON: None available CLINICAL HISTORY: Stroke/TIA, determine embolic source. Patient states she had trouble speaking her words earlier today; Hx of possible TIA/cancer found in the lung; 70 ml omni 350. FINDINGS: CTA NECK: AORTIC ARCH AND ARCH VESSELS: No dissection or arterial injury. No significant stenosis of the brachiocephalic or subclavian arteries. CERVICAL CAROTID ARTERIES: No dissection, arterial injury, or hemodynamically significant stenosis by NASCET criteria. CERVICAL VERTEBRAL ARTERIES: No dissection, arterial injury, or significant stenosis. LUNGS AND MEDIASTINUM: Ground glass opacity of the left lung apex measuring 8 mm. According to the Fleischner Society pulmonary nodule recommendations, the finding is consistent with a single ground glass nodule 6 mm and the recommendation is CT at 6-12 months, then if persistent, CT every 2 years until 5 years. SOFT TISSUES: No acute abnormality. BONES: No acute abnormality. CTA HEAD: ANTERIOR CIRCULATION: No significant stenosis of the internal carotid arteries. No significant stenosis of the anterior cerebral arteries. No significant stenosis of the middle cerebral arteries. No aneurysm. POSTERIOR CIRCULATION: No significant stenosis of the posterior cerebral arteries. No significant stenosis of the basilar artery. No significant stenosis of the vertebral  arteries. No aneurysm. OTHER: No dural venous sinus thrombosis on this non-dedicated study. IMPRESSION: 1. No large vessel occlusion, hemodynamically significant stenosis, or aneurysm in the head or neck. 2. 8 mm ground-glass nodule in the left lung apex. Recommend chest CT at 612 months; if persistent, chest CT every 2 years until 5 years. Electronically signed by: Franky Stanford MD 05/09/2024 10:21 PM EDT RP Workstation: [id number]  CT HEAD WO CONTRAST Result Date: 05/09/2024 CLINICAL DATA:  Aphasia resolved at time of triaged EXAM: CT HEAD WITHOUT CONTRAST TECHNIQUE: Contiguous axial images were obtained from the base of the skull through the vertex without intravenous contrast. RADIATION DOSE REDUCTION: This exam was performed according to the departmental dose-optimization program which includes automated exposure control, adjustment of the mA and/or kV according to patient size and/or use of iterative reconstruction technique. COMPARISON:  MRI brain 11/02/2015 FINDINGS: Brain: No acute territorial infarction, hemorrhage or intracranial mass. Moderate atrophy. Mild chronic small vessel ischemic changes of the white matter. Nonenlarged ventricles Vascular: No hyperdense vessels.  No unexpected calcification Skull: Normal. Negative for fracture or focal lesion. Sinuses/Orbits: No acute finding. Other: None. IMPRESSION: 1. No CT evidence for acute intracranial abnormality. 2. Atrophy and chronic small vessel ischemic changes of the white matter. Electronically Signed   By: Luke Bun M.D.   On: 05/09/2024 15:59    EKG: Independently reviewed. Sinus rhythm.   Assessment/Plan   1. Transient speech disturbance  - TIA suspected  - No acute findings on CTA head and neck or MRI brain  - Continue neuro checks and cardiac monitoring, check lipids, A1c, and echo, consult PT/OT/SLP, load with 300 mg Plavix and then ASA 81 and Plavix 75 mg daily    2. CKD 3A  - Appears close to baseline  - Renally-dose  medications    3. Asthma; OSA  - Not in exacerbation, uses dental appliance for OSA  - Continue ICS-LABA and Singulair     4. Lung cancer  - Hx of stage IA NSCLC and multiple other nodules, s/p left lower lobectomy in 2021, currently under observation with Dr. Sherrod   5. Chronic HFpEF  - Appears compensated   DVT prophylaxis: Lovenox   Code Status: Full  Level of Care: Level of care: Telemetry Medical Family Communication: None present  Disposition Plan Patient is from: Home Anticipated d/c is to: Home Anticipated d/c date is: 10/22 or 05/11/24  Patient currently: Pending TIA workup  Consults called: Neurology  Admission status: Observation     Evalene GORMAN Sprinkles, MD [facility]  05/10/2024, 3:04 AM

## 2024-05-09 NOTE — ED Notes (Signed)
Pt ambulated from bed to bathroom without assistance.

## 2024-05-09 NOTE — ED Provider Notes (Signed)
 [place] EMERGENCY DEPARTMENT AT [facility] Provider Note   CSN: [id number] Arrival date & time: 05/09/24  1500     Patient presents with: Aphasia   Anne Hogan is a 78 y.o. female.   HPI     Patient presents because of episode of aphasia or dysarthria.  This happened around 230.  Found that she was having a hard time getting her words out was maybe slurring her words at the same time.  No vision changes.  No numbness or tingling anywhere that she could appreciate at that time.  Lasted for about 5 to 10 minutes.  Subsequently is completely resolved.  Feels completely back at baseline at this point in time.  No numbness or ting anywhere.  No vision changes.  No diplopia.  No difficulty walking or speaking.  Family member sitting at bedside states that they are acting normal.  Patient denies any history of TIA or CVA.  Denies any history of A-fib.  Currently taking aspirin  81 mg.  Denies any history of known hypertension or high cholesterol.  Previous medical history reviewed : Follows up with oncology.  Last note was made in June 2025.  History of non-small lung cancer adenocarcinoma.  Status post left lower lobectomy.  Has been on observation since then.  Mentioned lower back pain during this visit.  Was seen in ED on April 09, 2024 and exam was consistent for musculoskeletal back pain.     Prior to Admission medications   Medication Sig Start Date End Date Taking? Authorizing Provider  acetaminophen  (TYLENOL ) 500 MG tablet Take 500-1,000 mg by mouth every 6 (six) hours as needed for moderate pain (pain).     [provider]  aspirin  EC 81 MG EC tablet Take 1 tablet (81 mg total) by mouth daily. 08/08/19   Dwan Kyla HERO, PA-C  BREO ELLIPTA  100-25 MCG/INH AEPB USE 1 PUFF DAILY AS DIRECTED 02/16/20   Mannam, Praveen, MD  cyclobenzaprine  (FLEXERIL ) 10 MG tablet Take 0.5 tablets (5 mg total) by mouth at bedtime as needed for muscle spasms. 04/09/24   Simon Lavonia SAILOR, MD  doxycycline  (VIBRAMYCIN ) 100 MG capsule Take 1 capsule (100 mg total) one hour prior to any dental procedure 06/19/22   Camnitz, Soyla Lunger, MD  lidocaine  (LIDODERM ) 5 % Place 1 patch onto the skin daily. Remove & Discard patch within 12 hours or as directed by MD 04/09/24   Simon Lavonia SAILOR, MD  montelukast  (SINGULAIR ) 10 MG tablet Take 10 mg by mouth daily as needed (respiratory issues.).    [provider]  Multiple Vitamins-Minerals (CENTRUM SILVER 50+WOMEN PO) Take 1 tablet by mouth daily.    [provider]  pantoprazole  (PROTONIX ) 40 MG tablet Take 40 mg by mouth at bedtime.    [provider]    Allergies: Penicillins, Codeine, Milk-related compounds, and Morphine  and codeine    Review of Systems  Constitutional:  Negative for chills and fever.  HENT:  Negative for ear pain and sore throat.   Eyes:  Negative for pain and visual disturbance.  Respiratory:  Negative for cough and shortness of breath.   Cardiovascular:  Negative for chest pain and palpitations.  Gastrointestinal:  Negative for abdominal pain and vomiting.  Genitourinary:  Negative for dysuria and hematuria.  Musculoskeletal:  Negative for arthralgias and back pain.  Skin:  Negative for color change and rash.  Neurological:  Negative for seizures and syncope.  All other systems reviewed and are negative.  Updated Vital Signs BP (!) 141/79 (BP Location: Right Arm)   Pulse 77   Temp 97.9 F (36.6 C)   Resp 18   SpO2 93%   Physical Exam Vitals and nursing note reviewed.  Constitutional:      General: She is not in acute distress.    Appearance: She is well-developed.  HENT:     Head: Normocephalic and atraumatic.  Eyes:     Conjunctiva/sclera: Conjunctivae normal.  Cardiovascular:     Rate and Rhythm: Normal rate and regular rhythm.     Heart sounds: No murmur heard. Pulmonary:     Effort: Pulmonary effort is normal. No respiratory distress.     Breath sounds:  Normal breath sounds.  Abdominal:     Palpations: Abdomen is soft.     Tenderness: There is no abdominal tenderness.  Musculoskeletal:        General: No swelling.     Cervical back: Neck supple.  Skin:    General: Skin is warm and dry.     Capillary Refill: Capillary refill takes less than 2 seconds.  Neurological:     Mental Status: She is alert.  Psychiatric:        Mood and Affect: Mood normal.     (all labs ordered are listed, but only abnormal results are displayed) Labs Reviewed  COMPREHENSIVE METABOLIC PANEL WITH GFR - Abnormal; Notable for the following components:      Result Value   Glucose, Bld 112 (*)    Creatinine, Ser 1.09 (*)    GFR, Estimated 52 (*)    All other components within normal limits  CBG MONITORING, ED - Abnormal; Notable for the following components:   Glucose-Capillary 109 (*)    All other components within normal limits  PROTIME-INR  APTT  CBC  DIFFERENTIAL  ETHANOL    EKG: None  Radiology: CT HEAD WO CONTRAST Result Date: 05/09/2024 CLINICAL DATA:  Aphasia resolved at time of triaged EXAM: CT HEAD WITHOUT CONTRAST TECHNIQUE: Contiguous axial images were obtained from the base of the skull through the vertex without intravenous contrast. RADIATION DOSE REDUCTION: This exam was performed according to the departmental dose-optimization program which includes automated exposure control, adjustment of the mA and/or kV according to patient size and/or use of iterative reconstruction technique. COMPARISON:  MRI brain 11/02/2015 FINDINGS: Brain: No acute territorial infarction, hemorrhage or intracranial mass. Moderate atrophy. Mild chronic small vessel ischemic changes of the white matter. Nonenlarged ventricles Vascular: No hyperdense vessels.  No unexpected calcification Skull: Normal. Negative for fracture or focal lesion. Sinuses/Orbits: No acute finding. Other: None. IMPRESSION: 1. No CT evidence for acute intracranial abnormality. 2. Atrophy and  chronic small vessel ischemic changes of the white matter. Electronically Signed   By: Luke Bun M.D.   On: 05/09/2024 15:59     Procedures   Medications Ordered in the ED  sodium chloride  flush (NS) 0.9 % injection 3 mL ( Intravenous Canceled Entry 05/09/24 1510)  clopidogrel (PLAVIX) tablet 75 mg (75 mg Oral Given 05/09/24 1731)    Clinical Course as of 05/09/24 1909  Tue May 09, 2024  1629 Plavix 75   Dr. Matthews  [TL]    Clinical Course User Index [TL] Simon Lavonia SAILOR, MD                                 Medical Decision Making Amount and/or Complexity of Data Reviewed Labs: ordered.  Radiology: ordered.  Risk Prescription drug management. Decision regarding hospitalization.     HPI:    Patient presents because of episode of aphasia or dysarthria.  This happened around 230.  Found that she was having a hard time getting her words out was maybe slurring her words at the same time.  No vision changes.  No numbness or tingling anywhere that she could appreciate at that time.  Lasted for about 5 to 10 minutes.  Subsequently is completely resolved.  Feels completely back at baseline at this point in time.  No numbness or ting anywhere.  No vision changes.  No diplopia.  No difficulty walking or speaking.  Family member sitting at bedside states that they are acting normal.  Patient denies any history of TIA or CVA.  Denies any history of A-fib.  Currently taking aspirin  81 mg.  Denies any history of known hypertension or high cholesterol.  Previous medical history reviewed : Follows up with oncology.  Last note was made in June 2025.  History of non-small lung cancer adenocarcinoma.  Status post left lower lobectomy.  Has been on observation since then.  Mentioned lower back pain during this visit.  Was seen in ED on April 09, 2024 and exam was consistent for musculoskeletal back pain.   MDM:    Upon exam, patient hemodynamically stable.  A and O x 3 GCS 15.  NIH 0.   Cranials 2 through 12 intact.  No dysarthria or aphasia.  Strength and sensation intact in bilateral face as well as bilateral upper and lower extremities.  Normal finger-nose.   Sounds more like a TIA.  No active CVA findings on exam this point time.  Certainly not a TNK candidate or concern for LVO.  Will obtain basic laboratory workup.  EKG as well as cardiac telemetry to rule out any kind of arrhythmia.  Obtaining CT head dry to rule out any kind of obvious bleed which I think is unlikely.    Reevaluation:   Laboratory workup unremarkable.  CT head benign.  EKG and cardiac telemetry sinus rhythm  I did speak to Dr. Matthews from neurology regarding the patient.  Recommended inpatient admission for TIA workup.  Recommended one-time dose of Plavix today at 75 mg.  Recommended aspirin  and Plavix starting tomorrow.  Permissive hypertension  Accepted for admission by the hospitalist team.  Interventions: Plavix  EKG Interpreted by Me: NSR   Cardiac Tele Interpreted by Me: NSR   I have independently interpreted the  CT  images and agree with the radiologist finding   Social Determinant of Health: None    Disposition and Follow Up: Admission       Final diagnoses:  TIA (transient ischemic attack)    ED Discharge Orders     None          Simon Lavonia SAILOR, MD 05/09/24 1909

## 2024-05-10 ENCOUNTER — Observation Stay (HOSPITAL_COMMUNITY)

## 2024-05-10 ENCOUNTER — Other Ambulatory Visit (HOSPITAL_COMMUNITY): Payer: Self-pay

## 2024-05-10 DIAGNOSIS — Z7982 Long term (current) use of aspirin: Secondary | ICD-10-CM

## 2024-05-10 DIAGNOSIS — R569 Unspecified convulsions: Secondary | ICD-10-CM | POA: Diagnosis not present

## 2024-05-10 DIAGNOSIS — G459 Transient cerebral ischemic attack, unspecified: Secondary | ICD-10-CM

## 2024-05-10 LAB — CBC
HCT: 39.4 % (ref 36.0–46.0)
Hemoglobin: 13.3 g/dL (ref 12.0–15.0)
MCH: 30.2 pg (ref 26.0–34.0)
MCHC: 33.8 g/dL (ref 30.0–36.0)
MCV: 89.3 fL (ref 80.0–100.0)
Platelets: 286 K/uL (ref 150–400)
RBC: 4.41 MIL/uL (ref 3.87–5.11)
RDW: 12.7 % (ref 11.5–15.5)
WBC: 5.6 K/uL (ref 4.0–10.5)
nRBC: 0 % (ref 0.0–0.2)

## 2024-05-10 LAB — BASIC METABOLIC PANEL WITH GFR
Anion gap: 8 (ref 5–15)
BUN: 12 mg/dL (ref 8–23)
CO2: 22 mmol/L (ref 22–32)
Calcium: 8.7 mg/dL — ABNORMAL LOW (ref 8.9–10.3)
Chloride: 109 mmol/L (ref 98–111)
Creatinine, Ser: 0.98 mg/dL (ref 0.44–1.00)
GFR, Estimated: 59 mL/min — ABNORMAL LOW (ref >60)
Glucose, Bld: 92 mg/dL (ref 70–99)
Potassium: 3.7 mmol/L (ref 3.5–5.1)
Sodium: 139 mmol/L (ref 135–145)

## 2024-05-10 LAB — LIPID PANEL
Cholesterol: 210 mg/dL — ABNORMAL HIGH (ref 0–200)
HDL: 66 mg/dL (ref >40)
LDL Cholesterol: 132 mg/dL — ABNORMAL HIGH (ref 0–99)
Total CHOL/HDL Ratio: 3.2 ratio
Triglycerides: 60 mg/dL (ref <150)
VLDL: 12 mg/dL (ref 0–40)

## 2024-05-10 LAB — ECHOCARDIOGRAM COMPLETE
AR max vel: 3.44 cm2
AV Area VTI: 3.7 cm2
AV Area mean vel: 3.44 cm2
AV Mean grad: 2 mmHg
AV Peak grad: 4.2 mmHg
AV Vena cont: 0.3 cm
Ao pk vel: 1.02 m/s
Area-P 1/2: 2.34 cm2
Height: 62 in
MV M vel: 5.05 m/s
MV Peak grad: 102 mmHg
MV VTI: 3.79 cm2
P 1/2 time: 684 ms
S' Lateral: 2.7 cm
Single Plane A4C EF: 56.3 %
Weight: 2627.88 [oz_av]

## 2024-05-10 LAB — HEMOGLOBIN A1C
Hgb A1c MFr Bld: 5.3 % (ref 4.8–5.6)
Mean Plasma Glucose: 105.41 mg/dL

## 2024-05-10 LAB — MAGNESIUM: Magnesium: 2.1 mg/dL (ref 1.7–2.4)

## 2024-05-10 MED ORDER — APIXABAN 5 MG PO TABS: 5.0000 mg | ORAL_TABLET | Freq: Two times a day (BID) | ORAL | 0 refills | Status: DC

## 2024-05-10 MED ORDER — ROSUVASTATIN CALCIUM 20 MG PO TABS
20.0000 mg | ORAL_TABLET | Freq: Every day | ORAL | 0 refills | Status: DC
  Filled 2024-05-10: qty 90, 90d supply, fill #0

## 2024-05-10 MED ORDER — APIXABAN 5 MG PO TABS
5.0000 mg | ORAL_TABLET | Freq: Two times a day (BID) | ORAL | 0 refills | Status: DC
  Filled 2024-05-10: qty 60, 30d supply, fill #0

## 2024-05-10 MED ORDER — MAGNESIUM CHLORIDE 64 MG PO TBEC
1.0000 | DELAYED_RELEASE_TABLET | Freq: Every day | ORAL | Status: DC
  Administered 2024-05-10: 64 mg via ORAL
  Filled 2024-05-10: qty 1

## 2024-05-10 MED ORDER — ROSUVASTATIN CALCIUM 20 MG PO TABS
20.0000 mg | ORAL_TABLET | Freq: Every day | ORAL | Status: DC
  Administered 2024-05-10: 20 mg via ORAL
  Filled 2024-05-10: qty 1

## 2024-05-10 MED ORDER — ROSUVASTATIN CALCIUM 20 MG PO TABS: 20.0000 mg | ORAL_TABLET | Freq: Every day | ORAL | 0 refills | Status: DC

## 2024-05-10 MED ORDER — CLOPIDOGREL BISULFATE 75 MG PO TABS
225.0000 mg | ORAL_TABLET | Freq: Once | ORAL | Status: AC
  Administered 2024-05-10: 225 mg via ORAL
  Filled 2024-05-10: qty 3

## 2024-05-10 NOTE — Discharge Instructions (Signed)
 Anne Hogan,  You were in the hospital with stroke-like symptoms that have thankfully resolved. The stroke doctor thinks this is related to your history of atrial fibrillation and has started you on Eliquis to help reduced the risk of future episodes. Please follow-up with your PCP and the stroke doctor.

## 2024-05-10 NOTE — Plan of Care (Signed)

## 2024-05-10 NOTE — Progress Notes (Signed)
  Echocardiogram 2D Echocardiogram has been performed.  Koleen KANDICE Popper, RDCS 05/10/2024, 9:31 AM

## 2024-05-10 NOTE — Procedures (Signed)
 Patient Name: Anne Hogan  MRN: [id number]  Epilepsy Attending: Arlin MALVA Krebs  Referring Physician/Provider: everitt Clint Abbey Earle FORBES, NP  Date: 05/10/2024 Duration: 22.54 mins  Patient history: 78yo f with transient expressive aphasia. EEG to evaluate for seizure   Level of alertness: Awake, asleep  AEDs during EEG study: None  Technical aspects: This EEG study was done with scalp electrodes positioned according to the 10-20 International system of electrode placement. Electrical activity was reviewed with band pass filter of 1-70Hz , sensitivity of 7 uV/mm, display speed of 69mm/sec with a 60Hz  notched filter applied as appropriate. EEG data were recorded continuously and digitally stored.  Video monitoring was available and reviewed as appropriate.  Description: The posterior dominant rhythm consists of 8 Hz activity of moderate voltage (25-35 uV) seen predominantly in posterior head regions, symmetric and reactive to eye opening and eye closing. Sleep was characterized by vertex waves, sleep spindles (12 to 14 Hz), maximal frontocentral region. Physiologic photic driving was not seen during photic stimulation.  Hyperventilation was not performed.     IMPRESSION: This study is within normal limits. No seizures or epileptiform discharges were seen throughout the recording.  A normal interictal EEG does not exclude the diagnosis of epilepsy.   Dante Cooter O Lanae Federer

## 2024-05-10 NOTE — Progress Notes (Signed)
 OT Cancellation Note  Patient Details Name: Anne Hogan MRN: [id number] DOB: 10/19/1945   Cancelled Treatment:    Reason Eval/Treat Not Completed: OT screened, no needs identified, will sign off. Spoke with pt who reports no concerns re: ADLs, IADLs, driving upon d/c. She anticipates discharge later this afternoon. Will sign-off, please re-consult if needs arise.   Kailey Esquilin D., MSOT, OTR/L Acute Rehabilitation Services [phone] Secure Chat Preferred  [name] 05/10/2024, 12:13 PM

## 2024-05-10 NOTE — Care Management Obs Status (Signed)
 MEDICARE OBSERVATION STATUS NOTIFICATION   Patient Details  Name: Anne Hogan MRN: [id number] Date of Birth: [birth year]   Medicare Observation Status Notification Given:  Yes    Danny Yackley Crawford 05/10/2024, 10:31 AM

## 2024-05-10 NOTE — Progress Notes (Signed)
 SLP Cancellation Note  Patient Details Name: Anne Hogan MRN: [id number] DOB: 02/11/1946   Cancelled treatment:       Reason Eval/Treat Not Completed: SLP screened. MRI negative and pt states she has returned to baseline. No needs identified, will sign off.   Damien Blumenthal, M.A., CCC-SLP Speech Language Pathology, Acute Rehabilitation Services  Secure Chat preferred [phone]  05/10/2024, 10:11 AM

## 2024-05-10 NOTE — Plan of Care (Signed)
 Problem: Education: Goal: Knowledge of General Education information will improve Description: Including pain rating scale, medication(s)/side effects and non-pharmacologic comfort measures 05/10/2024 1804 by Jenel Bobetta SAILOR, RN Outcome: Adequate for Discharge 05/10/2024 9188 by Jenel Bobetta SAILOR, RN Outcome: Progressing   Problem: Health Behavior/Discharge Planning: Goal: Ability to manage health-related needs will improve 05/10/2024 1804 by Jenel Bobetta SAILOR, RN Outcome: Adequate for Discharge 05/10/2024 9188 by Jenel Bobetta SAILOR, RN Outcome: Progressing   Problem: Clinical Measurements: Goal: Ability to maintain clinical measurements within normal limits will improve 05/10/2024 1804 by Jenel Bobetta SAILOR, RN Outcome: Adequate for Discharge 05/10/2024 9188 by Jenel Bobetta SAILOR, RN Outcome: Progressing Goal: Will remain free from infection 05/10/2024 1804 by Jenel Bobetta SAILOR, RN Outcome: Adequate for Discharge 05/10/2024 9188 by Jenel Bobetta SAILOR, RN Outcome: Progressing Goal: Diagnostic test results will improve 05/10/2024 1804 by Jenel Bobetta SAILOR, RN Outcome: Adequate for Discharge 05/10/2024 9188 by Jenel Bobetta SAILOR, RN Outcome: Progressing Goal: Respiratory complications will improve 05/10/2024 1804 by Jenel Bobetta SAILOR, RN Outcome: Adequate for Discharge 05/10/2024 9188 by Jenel Bobetta SAILOR, RN Outcome: Progressing Goal: Cardiovascular complication will be avoided 05/10/2024 1804 by Jenel Bobetta SAILOR, RN Outcome: Adequate for Discharge 05/10/2024 9188 by Jenel Bobetta SAILOR, RN Outcome: Progressing   Problem: Activity: Goal: Risk for activity intolerance will decrease 05/10/2024 1804 by Jenel Bobetta SAILOR, RN Outcome: Adequate for Discharge 05/10/2024 9188 by Jenel Bobetta SAILOR, RN Outcome: Progressing   Problem: Nutrition: Goal: Adequate nutrition will be maintained 05/10/2024 1804 by Jenel Bobetta SAILOR, RN Outcome: Adequate for Discharge 05/10/2024 9188 by Jenel Bobetta SAILOR, RN Outcome:  Progressing   Problem: Coping: Goal: Level of anxiety will decrease 05/10/2024 1804 by Jenel Bobetta SAILOR, RN Outcome: Adequate for Discharge 05/10/2024 9188 by Jenel Bobetta SAILOR, RN Outcome: Progressing   Problem: Elimination: Goal: Will not experience complications related to bowel motility 05/10/2024 1804 by Jenel Bobetta SAILOR, RN Outcome: Adequate for Discharge 05/10/2024 9188 by Jenel Bobetta SAILOR, RN Outcome: Progressing Goal: Will not experience complications related to urinary retention 05/10/2024 1804 by Jenel Bobetta SAILOR, RN Outcome: Adequate for Discharge 05/10/2024 9188 by Jenel Bobetta SAILOR, RN Outcome: Progressing   Problem: Pain Managment: Goal: General experience of comfort will improve and/or be controlled 05/10/2024 1804 by Jenel Bobetta SAILOR, RN Outcome: Adequate for Discharge 05/10/2024 9188 by Jenel Bobetta SAILOR, RN Outcome: Progressing   Problem: Safety: Goal: Ability to remain free from injury will improve 05/10/2024 1804 by Jenel Bobetta SAILOR, RN Outcome: Adequate for Discharge 05/10/2024 9188 by Jenel Bobetta SAILOR, RN Outcome: Progressing   Problem: Skin Integrity: Goal: Risk for impaired skin integrity will decrease 05/10/2024 1804 by Jenel Bobetta SAILOR, RN Outcome: Adequate for Discharge 05/10/2024 9188 by Jenel Bobetta SAILOR, RN Outcome: Progressing   Problem: Education: Goal: Knowledge of disease or condition will improve 05/10/2024 1804 by Jenel Bobetta SAILOR, RN Outcome: Adequate for Discharge 05/10/2024 9188 by Jenel Bobetta SAILOR, RN Outcome: Progressing Goal: Knowledge of secondary prevention will improve (MUST DOCUMENT ALL) 05/10/2024 1804 by Jenel Bobetta SAILOR, RN Outcome: Adequate for Discharge 05/10/2024 [phone] by Jenel Bobetta SAILOR, RN Outcome: Progressing Goal: Knowledge of patient specific risk factors will improve (DELETE if not current risk factor) 05/10/2024 1804 by Jenel Bobetta SAILOR, RN Outcome: Adequate for Discharge 05/10/2024 0811 by Jenel Bobetta SAILOR, RN Outcome:  Progressing   Problem: Ischemic Stroke/TIA Tissue Perfusion: Goal: Complications of ischemic stroke/TIA will be minimized 05/10/2024 1804 by Jenel Bobetta SAILOR, RN Outcome: Adequate for Discharge 05/10/2024 9188 by Jenel Bobetta SAILOR, RN  Outcome: Progressing   Problem: Coping: Goal: Will verbalize positive feelings about self 05/10/2024 1804 by Jenel Bobetta SAILOR, RN Outcome: Adequate for Discharge 05/10/2024 9188 by Jenel Bobetta SAILOR, RN Outcome: Progressing Goal: Will identify appropriate support needs 05/10/2024 1804 by Jenel Bobetta SAILOR, RN Outcome: Adequate for Discharge 05/10/2024 9188 by Jenel Bobetta SAILOR, RN Outcome: Progressing   Problem: Health Behavior/Discharge Planning: Goal: Ability to manage health-related needs will improve 05/10/2024 1804 by Jenel Bobetta SAILOR, RN Outcome: Adequate for Discharge 05/10/2024 9188 by Jenel Bobetta SAILOR, RN Outcome: Progressing Goal: Goals will be collaboratively established with patient/family 05/10/2024 1804 by Jenel Bobetta SAILOR, RN Outcome: Adequate for Discharge 05/10/2024 [phone] by Jenel Bobetta SAILOR, RN Outcome: Progressing   Problem: Self-Care: Goal: Ability to participate in self-care as condition permits will improve 05/10/2024 1804 by Jenel Bobetta SAILOR, RN Outcome: Adequate for Discharge 05/10/2024 9188 by Jenel Bobetta SAILOR, RN Outcome: Progressing Goal: Verbalization of feelings and concerns over difficulty with self-care will improve 05/10/2024 1804 by Jenel Bobetta SAILOR, RN Outcome: Adequate for Discharge 05/10/2024 9188 by Jenel Bobetta SAILOR, RN Outcome: Progressing Goal: Ability to communicate needs accurately will improve 05/10/2024 1804 by Jenel Bobetta SAILOR, RN Outcome: Adequate for Discharge 05/10/2024 9188 by Jenel Bobetta SAILOR, RN Outcome: Progressing   Problem: Nutrition: Goal: Risk of aspiration will decrease 05/10/2024 1804 by Jenel Bobetta SAILOR, RN Outcome: Adequate for Discharge 05/10/2024 9188 by Jenel Bobetta SAILOR, RN Outcome:  Progressing Goal: Dietary intake will improve 05/10/2024 1804 by Jenel Bobetta SAILOR, RN Outcome: Adequate for Discharge 05/10/2024 9188 by Jenel Bobetta SAILOR, RN Outcome: Progressing

## 2024-05-10 NOTE — TOC Transition Note (Signed)
 Transition of Care [facility]) - Discharge Note   Patient Details  Name: Anne Hogan MRN: [id number] Date of Birth: [birth year]  [facility] CM/SW Contact:  Andrez JULIANNA George, RN Phone Number: 05/10/2024, 12:59 PM   Clinical Narrative:     Pt will discharge home with outpatient therapy referral sent to [facility]. Information on the AVS. Pt will call to schedule the first appointment. Pt lives alone but has friends that will assist as needed.  She drives self but friends will assist.  She manages her own medications and denies any issues.  DME at home: walker/ cane/ shower seat--no new DME needs. Pt has transportation home.  Final next level of care: OP Rehab Barriers to Discharge: No Barriers Identified   Patient Goals and CMS Choice     Choice offered to / list presented to : Patient      Discharge Placement                       Discharge Plan and Services Additional resources added to the After Visit Summary for                                       Social Drivers of Health (SDOH) Interventions SDOH Screenings   Food Insecurity: No Food Insecurity (05/09/2024)  Housing: Low Risk  (05/09/2024)  Transportation Needs: No Transportation Needs (05/09/2024)  Utilities: Not At Risk (05/09/2024)  Depression (PHQ2-9): Low Risk  (11/07/2019)  Social Connections: Moderately Integrated (05/09/2024)  Tobacco Use: Medium Risk (05/09/2024)     Readmission Risk Interventions     No data to display

## 2024-05-10 NOTE — Evaluation (Signed)
 Physical Therapy Evaluation Patient Details Name: Anne Hogan MRN: [id number] DOB: [birth year] Today's Date: 05/10/2024  History of Present Illness  Pt is a 78 y/o F who presented to [facility] on 05/09/24 for brief episode of aphasia and mild dysarthria. CTH and MRI brain negative for acute findings. PMHx: HFpEF, CKD, OSA, asthma, depression, GERD, lung CA with pulmonary nodules s/p LLL lobectomy, afib.   Clinical Impression  PTA pt was independent for mobility with no AD. Pt reports being slightly unsteady at baseline with hx of collapsed arch in R foot. Pt presents close to mobility baseline being independent to stand with no AD and supervision/CGA to ambulate 458ft with no AD. Pt tends to veer left/right when ambulating with unsteadiness observed when performing head turns. Pt scored a 17/24 on the DGI indicating pt is at an increased risk of falling. Recommending OP PT to work on balance deficits with pt in agreement. Pt has intermittent assist available upon d/c home. Acute PT to follow to address impairments listed below.         If plan is discharge home, recommend the following: Assist for transportation   Can travel by private vehicle    Yes    Equipment Recommendations None recommended by PT     Functional Status Assessment Patient has had a recent decline in their functional status and demonstrates the ability to make significant improvements in function in a reasonable and predictable amount of time.     Precautions / Restrictions Precautions Precautions: Fall Recall of Precautions/Restrictions: Intact Restrictions Weight Bearing Restrictions Per Provider Order: No      Mobility  Bed Mobility Overal bed mobility: Independent     Transfers Overall transfer level: Independent Equipment used: None   Ambulation/Gait Ambulation/Gait assistance: Supervision, Contact guard assist Gait Distance (Feet): 400 Feet Assistive device: None Gait Pattern/deviations:  Step-through pattern, Decreased stride length, Drifts right/left Gait velocity: WFL     General Gait Details: drifts right/left with unsteadiness when performing head turns  Stairs Stairs: Yes Stairs assistance: Supervision Stair Management: One rail Right, One rail Left, Step to pattern, Forwards Number of Stairs: 6 General stair comments: 3 steps with no UE support, 3 steps with B UE support     Modified Rankin (Stroke Patients Only) Modified Rankin (Stroke Patients Only) Pre-Morbid Rankin Score: No symptoms Modified Rankin: Moderately severe disability     Balance Overall balance assessment: Needs assistance Sitting-balance support: Feet supported, No upper extremity supported Sitting balance-Leahy Scale: Good     Standing balance support: During functional activity Standing balance-Leahy Scale: Fair    Standardized Balance Assessment Standardized Balance Assessment : Dynamic Gait Index   Dynamic Gait Index Level Surface: Mild Impairment Change in Gait Speed: Normal Gait with Horizontal Head Turns: Moderate Impairment Gait with Vertical Head Turns: Moderate Impairment Gait and Pivot Turn: Normal Step Over Obstacle: Normal Step Around Obstacles: Normal Steps: Moderate Impairment Total Score: 17       Pertinent Vitals/Pain Pain Assessment Pain Assessment: No/denies pain    Home Living Family/patient expects to be discharged to:: Private residence Living Arrangements: Alone Available Help at Discharge: Family;Friend(s);Available PRN/intermittently;Neighbor Type of Home: House Home Access: Stairs to enter Entrance Stairs-Rails: None Entrance Stairs-Number of Steps: 1 Alternate Level Stairs-Number of Steps: 19 Home Layout: Two level;Able to live on main level with bedroom/bathroom Home Equipment: Shower seat;Hand held shower head;Grab bars - tub/shower;Cane - single point;Cane - quad;Rolling Environmental consultant (2 wheels) (walking)      Prior Function Prior Level of  Function :  Independent/Modified Independent;Driving    Mobility Comments: Ind with no AD       Extremity/Trunk Assessment   Upper Extremity Assessment Upper Extremity Assessment: Defer to OT evaluation    Lower Extremity Assessment Lower Extremity Assessment: RLE deficits/detail RLE Deficits / Details: hx of collapsed arch    Cervical / Trunk Assessment Cervical / Trunk Assessment: Normal  Communication   Communication Communication: No apparent difficulties    Cognition Arousal: Alert Behavior During Therapy: WFL for tasks assessed/performed   PT - Cognitive impairments: No apparent impairments    Following commands: Intact       Cueing Cueing Techniques: Verbal cues     General Comments General comments (skin integrity, edema, etc.): VSS on RA     PT Assessment Patient needs continued PT services  PT Problem List Decreased balance;Decreased mobility       PT Treatment Interventions DME instruction;Gait training;Stair training;Functional mobility training;Therapeutic activities;Therapeutic exercise;Balance training;Neuromuscular re-education;Patient/family education    PT Goals (Current goals can be found in the Care Plan section)  Acute Rehab PT Goals Patient Stated Goal: to work on balance PT Goal Formulation: With patient Time For Goal Achievement: 05/24/24 Potential to Achieve Goals: Good    Frequency Min 1X/week        AM-PAC PT 6 Clicks Mobility  Outcome Measure Help needed turning from your back to your side while in a flat bed without using bedrails?: None Help needed moving from lying on your back to sitting on the side of a flat bed without using bedrails?: None Help needed moving to and from a bed to a chair (including a wheelchair)?: None Help needed standing up from a chair using your arms (e.g., wheelchair or bedside chair)?: None Help needed to walk in hospital room?: A Little Help needed climbing 3-5 steps with a railing? : A  Little 6 Click Score: 22    End of Session   Activity Tolerance: Patient tolerated treatment well Patient left: in bed;with call bell/phone within reach Nurse Communication: Mobility status PT Visit Diagnosis: Unsteadiness on feet (R26.81);Other abnormalities of gait and mobility (R26.89)    Time: 9188-9161 PT Time Calculation (min) (ACUTE ONLY): 27 min   Charges:   PT Evaluation $PT Eval Low Complexity: 1 Low PT Treatments $Gait Training: 8-22 mins PT General Charges $$ ACUTE PT VISIT: 1 Visit       Anne ORN, PT, DPT Secure Chat Preferred  Rehab Office [phone]   Anne Hogan Wendolyn 05/10/2024, 8:42 AM

## 2024-05-10 NOTE — Progress Notes (Addendum)
 STROKE TEAM PROGRESS NOTE   INTERIM HISTORY/SUBJECTIVE She presented with transient 10 to 15-minute episode of expressive language difficulties.  She reports prior history of palpitations.  She did have transient perioperative A-fib following mitral valve repair. Patient remains hemodynamically stable and afebrile overnight.  She reports that her speech has returned to normal and symptoms have completely resolved.  OBJECTIVE  CBC    Component Value Date/Time   WBC 5.6 05/10/2024 0453   RBC 4.41 05/10/2024 0453   HGB 13.3 05/10/2024 0453   HGB 14.6 12/23/2023 1245   HGB 13.5 05/12/2019 1243   HCT 39.4 05/10/2024 0453   HCT 40.2 05/12/2019 1243   PLT 286 05/10/2024 0453   PLT 338 12/23/2023 1245   PLT 285 05/12/2019 1243   MCV 89.3 05/10/2024 0453   MCV 88 05/12/2019 1243   MCH 30.2 05/10/2024 0453   MCHC 33.8 05/10/2024 0453   RDW 12.7 05/10/2024 0453   RDW 12.5 05/12/2019 1243   LYMPHSABS 1.8 05/09/2024 1508   MONOABS 0.8 05/09/2024 1508   EOSABS 0.3 05/09/2024 1508   BASOSABS 0.1 05/09/2024 1508    BMET    Component Value Date/Time   NA 139 05/10/2024 0453   NA 140 05/12/2019 1243   K 3.7 05/10/2024 0453   CL 109 05/10/2024 0453   CO2 22 05/10/2024 0453   GLUCOSE 92 05/10/2024 0453   BUN 12 05/10/2024 0453   BUN 14 05/12/2019 1243   CREATININE 0.98 05/10/2024 0453   CREATININE 1.15 (H) 12/23/2023 1245   CREATININE 1.05 (H) 07/19/2019 1206   CALCIUM 8.7 (L) 05/10/2024 0453   GFRNONAA 59 (L) 05/10/2024 0453   GFRNONAA 49 (L) 12/23/2023 1245    IMAGING past 24 hours MR BRAIN WO CONTRAST Result Date: 05/09/2024 EXAM: MRI BRAIN WITHOUT CONTRAST 05/09/2024 11:33:16 PM TECHNIQUE: Multiplanar multisequence MRI of the head/brain was performed without the administration of intravenous contrast. COMPARISON: 10/23/2015 CLINICAL HISTORY: Neuro deficit, acute, stroke suspected. FINDINGS: BRAIN AND VENTRICLES: No acute infarct. No intracranial hemorrhage. No mass. No midline  shift. No hydrocephalus. Multifocal hyperintense T2-weighted signal within the cerebral white matter, most commonly due to chronic small vessel disease. Mild volume loss. The sella is unremarkable. Normal flow voids. ORBITS: No acute abnormality. SINUSES AND MASTOIDS: No acute abnormality. BONES AND SOFT TISSUES: Normal marrow signal. No acute soft tissue abnormality. IMPRESSION: 1. No acute intracranial abnormality. 2. Multifocal T2 hyperintense white matter signal, most consistent with chronic small vessel disease. Electronically signed by: Franky Stanford MD 05/09/2024 11:42 PM EDT RP Workstation: [id number]   CT ANGIO HEAD NECK W WO CM Result Date: 05/09/2024 EXAM: CTA HEAD AND NECK WITH AND WITHOUT 05/09/2024 10:07:43 PM TECHNIQUE: CTA of the head and neck was performed with and without the administration of 75 mL of iohexol  (OMNIPAQUE ) 350 MG/ML injection. Multiplanar 2D and/or 3D reformatted images are provided for review. Automated exposure control, iterative reconstruction, and/or weight based adjustment of the mA/kV was utilized to reduce the radiation dose to as low as reasonably achievable. Stenosis of the internal carotid arteries measured using NASCET criteria. COMPARISON: None available CLINICAL HISTORY: Stroke/TIA, determine embolic source. Patient states she had trouble speaking her words earlier today; Hx of possible TIA/cancer found in the lung; 70 ml omni 350. FINDINGS: CTA NECK: AORTIC ARCH AND ARCH VESSELS: No dissection or arterial injury. No significant stenosis of the brachiocephalic or subclavian arteries. CERVICAL CAROTID ARTERIES: No dissection, arterial injury, or hemodynamically significant stenosis by NASCET criteria. CERVICAL VERTEBRAL ARTERIES: No dissection, arterial injury,  or significant stenosis. LUNGS AND MEDIASTINUM: Ground glass opacity of the left lung apex measuring 8 mm. According to the Fleischner Society pulmonary nodule recommendations, the finding is consistent with a  single ground glass nodule 6 mm and the recommendation is CT at 6-12 months, then if persistent, CT every 2 years until 5 years. SOFT TISSUES: No acute abnormality. BONES: No acute abnormality. CTA HEAD: ANTERIOR CIRCULATION: No significant stenosis of the internal carotid arteries. No significant stenosis of the anterior cerebral arteries. No significant stenosis of the middle cerebral arteries. No aneurysm. POSTERIOR CIRCULATION: No significant stenosis of the posterior cerebral arteries. No significant stenosis of the basilar artery. No significant stenosis of the vertebral arteries. No aneurysm. OTHER: No dural venous sinus thrombosis on this non-dedicated study. IMPRESSION: 1. No large vessel occlusion, hemodynamically significant stenosis, or aneurysm in the head or neck. 2. 8 mm ground-glass nodule in the left lung apex. Recommend chest CT at 612 months; if persistent, chest CT every 2 years until 5 years. Electronically signed by: Franky Stanford MD 05/09/2024 10:21 PM EDT RP Workstation: [id number]   CT HEAD WO CONTRAST Result Date: 05/09/2024 CLINICAL DATA:  Aphasia resolved at time of triaged EXAM: CT HEAD WITHOUT CONTRAST TECHNIQUE: Contiguous axial images were obtained from the base of the skull through the vertex without intravenous contrast. RADIATION DOSE REDUCTION: This exam was performed according to the departmental dose-optimization program which includes automated exposure control, adjustment of the mA and/or kV according to patient size and/or use of iterative reconstruction technique. COMPARISON:  MRI brain 11/02/2015 FINDINGS: Brain: No acute territorial infarction, hemorrhage or intracranial mass. Moderate atrophy. Mild chronic small vessel ischemic changes of the white matter. Nonenlarged ventricles Vascular: No hyperdense vessels.  No unexpected calcification Skull: Normal. Negative for fracture or focal lesion. Sinuses/Orbits: No acute finding. Other: None. IMPRESSION: 1. No CT  evidence for acute intracranial abnormality. 2. Atrophy and chronic small vessel ischemic changes of the white matter. Electronically Signed   By: Luke Bun M.D.   On: 05/09/2024 15:59    Vitals:   05/09/24 2355 05/10/24 0417 05/10/24 0813 05/10/24 1142  BP: (!) 148/79 139/68 138/74 99/68  Pulse: 69 64 64 62  Resp: 18 18 18 18   Temp: 97.7 F (36.5 C) 98.4 F (36.9 C) (!) 97.5 F (36.4 C) (!) 97.5 F (36.4 C)  TempSrc: Oral Oral Oral Oral  SpO2: 96% 99% 99% 100%  Weight:      Height:         PHYSICAL EXAM General:  Alert, well-nourished, well-developed patient in no acute distress Psych:  Mood and affect appropriate for situation Respiratory:  Regular, unlabored respirations on room air   NEURO:  Mental Status: AA&Ox3, patient is able to give clear and coherent history Speech/Language: speech is without dysarthria or aphasia.  Naming, repetition, fluency, and comprehension intact.  Cranial Nerves:  II: PERRL. Visual fields full.  III, IV, VI: EOMI. Eyelids elevate symmetrically.  V: Sensation is intact to light touch and symmetrical to face.  VII: Face is symmetrical resting and smiling VIII: hearing intact to voice. IX, X:  Phonation is normal.  KP:Dynloizm shrug 5/5. XII: tongue is midline without fasciculations. Motor: 5/5 strength to all muscle groups tested.  Tone: is normal and bulk is normal Sensation- Intact to light touch bilaterally.  Coordination: FTN intact bilaterally Gait- deferred  Most Recent NIH 0   ASSESSMENT/PLAN  Anne Hogan is a 78 y.o. female with history of mitral valve prolapse and PFO  status postrepair in 2021 with postoperative A-fib admitted for transient expressive aphasia.  Patient reports that she was on the phone for about 10 minutes was unable to find the words that she wanted to say.  Symptoms completely resolved while patient was on the way to the emergency department.  Patient does report that she had postoperative A-fib  after her mitral valve and PFO repair and that she has a history of palpitations prior to that.  NIH on Admission 0  Left-sided TIA likely cardioembolic given history of remote transient A-fib Code Stroke CT head No acute abnormality. Small vessel disease. Atrophy. CTA head & neck no LVO or hemodynamically significant stenosis or aneurysm, 8 mm ground glass nodule in left lung apex MRI no acute abnormality 2D Echo pending LDL 132 HgbA1c 5.3 VTE prophylaxis -Lovenox  aspirin  81 mg daily prior to admission, now on aspirin  81 mg daily and clopidogrel 75 mg daily will discuss history of A-fib with cardiology and consider switching to Eliquis given history of palpitations, postop A-fib and left atrial enlargement seen on echocardiogram from 2023 Therapy recommendations:  Outpatient PT/OT/ST Disposition: Pending, likely home  Atrial fibrillation Home Meds: None Patient has a history of postop A-fib after her mitral valve repair as well as a history of palpitations and left atrial enlargement Continue telemetry monitoring Will discuss anticoagulation with Eliquis with cardiology  Hypertension Home meds: None Stable Maintain normotension  Hyperlipidemia Home meds: None LDL 132, goal < 70 Add rosuvastatin 20 mg daily Continue statin at discharge  Other Stroke Risk Factors Obesity, Body mass index is 30.04 kg/m., BMI >/= 30 associated with increased stroke risk, recommend weight loss, diet and exercise as appropriate    Other Active Problems None  Hospital day # 0  Patient seen by NP with MD, MD to edit note as needed. Cortney E Everitt Clint Kill , MSN, AGACNP-BC [facility] See Amion for schedule and pager information 05/10/2024 11:57 AM   I have personally obtained history,examined this patient, reviewed notes, independently viewed imaging studies, participated in medical decision making and plan of care.ROS completed by me personally and pertinent positives fully  documented  I have made any additions or clarifications directly to the above note. Agree with note above.  Patient presented with transient episode of expressive aphasia likely left hemispheric TIA and given prior history of transient perioperative A-fib and palpitations as well as dilated left atrium on echo strong possibility this may be related to A-fib and patient will benefit with long-term anticoagulation with Eliquis but will get cardiology opinion prior to making this decision.  Maintain aggressive risk factor modification.   I personally spent a total of 50 minutes in the care of the patient today including getting/reviewing separately obtained history, performing a medically appropriate exam/evaluation, counseling and educating, placing orders, referring and communicating with other health care professionals, documenting clinical information in the EHR, independently interpreting results, and coordinating care.         Eather Popp, MD Medical Director [facility] Pager: [phone] 05/10/2024 3:37 PM  To contact Stroke Continuity provider, please refer to [email or web address]. After hours, contact General Neurology

## 2024-05-10 NOTE — Discharge Summary (Incomplete)
 Physician Discharge Summary   Patient: Anne Hogan MRN: [id number] DOB: Nov 23, 1945  Admit date:     05/09/2024  Discharge date: 05/10/24  Discharge Physician: Elgin Lam, MD   PCP: Janey Santos, MD   Recommendations at discharge:  PCP visit for hospital follow-up Neurology visit for hospital follow-up Repeat chest CT in 6-12 months to assess 8 mm ground-glass nodule in the left lung apex  Discharge Diagnoses: Principal Problem:   TIA (transient ischemic attack) Active Problems:   Chronic diastolic congestive heart failure (HCC)   Adenocarcinoma, lung, left (HCC)   History of sleep apnea   Asthma   CKD stage 3a, GFR 45-59 ml/min (HCC)  Resolved Problems:   * No resolved hospital problems. *  Hospital Course: Anne Hogan is a 78 y.o. female with a history of OSA, chronic heart failure preserved EF, CKD stage IIIa, stage I lung cancer.  Patient presented secondary to speech impairment concerning for possible stroke.  Workup in overall presentation more consistent with TIA.  Imaging was negative for acute stroke.  Neurology recommendation for Eliquis on discharge in addition to Crestor for hyperlipidemia.  Assessment and Plan:  TIA Etiology is likely cardioembolic secondary to remote history of transient atrial fibrillation.  Patient presented with symptoms including speech impairment concerning for possible stroke. Initial CT head (10/21) significant for no evidence of acute intracranial abnormality. MRI brain (10/21) confirms no acute intracranial abnormality. CTA head and neck significant for no LVO. LDL of 132. Hemoglobin A1C of 5.3%. Transthoracic Echocardiogram significant for LVEF of 55 to 60% and no atrial level shunt. Neurology recommendations for Eliquis 2 g twice daily on discharge and to discontinue aspirin .  Patient was started on Crestor.  PT/OT recommendations for outpatient therapy. Recommendation for Neurology follow-up in 4 weeks.  CKD stage  IIIa Stable.  Hyperlipidemia LDL of 132.  Crestor milligrams daily started on discharge.  Asthma Continue Singulair  and Breo elliipta  OSA Noted.  History of non-small cell lung cancer Patient follows with Dr. Sherrod as an outpatient and is currently under observation. Abnormal CT this admission with evidence of an 8 mm ground glass nodule in the left lung apex which appears to be present from June; recommendation for repeat CT in 6 to 12 months.  Chronic HFpEF Stable.  GERD Continue Protonix .   Consultants: Neurology Procedures performed: Transthoracic Echocardiogram  Disposition: Home Diet recommendation: Cardiac diet   DISCHARGE MEDICATION: Allergies as of 05/10/2024       Reactions   Penicillins Rash   Has patient had a PCN reaction causing immediate rash, facial/tongue/throat swelling, SOB or lightheadedness with hypotension: No Has patient had a PCN reaction causing severe rash involving mucus membranes or skin necrosis: no Has patient had a PCN reaction that required hospitalization: No Has patient had a PCN reaction occurring within the last 10 years: no If all of the above answers are NO, then may proceed with Cephalosporin use.    Codeine Other (See Comments)   Possible sensitivity, patient not sure   Morphine  And Codeine    strange sensation across midsection and chest        Medication List     STOP taking these medications    aspirin  EC 81 MG tablet   cyclobenzaprine  10 MG tablet Commonly known as: FLEXERIL        TAKE these medications    acetaminophen  500 MG tablet Commonly known as: TYLENOL  Take 500-1,000 mg by mouth every 6 (six) hours as needed for moderate pain (pain).  apixaban 5 MG Tabs tablet Commonly known as: ELIQUIS Take 1 tablet (5 mg total) by mouth 2 (two) times daily.   BERBERINE HCI PO Take 1 tablet by mouth daily at 6 (six) AM.   BIOTIN PO Take 1 tablet by mouth daily at 6 (six) AM.   Breo Ellipta  100-25  MCG/INH Aepb Generic drug: fluticasone  furoate-vilanterol USE 1 PUFF DAILY AS DIRECTED   CENTRUM SILVER 50+WOMEN PO Take 1 tablet by mouth daily.   doxycycline  100 MG capsule Commonly known as: VIBRAMYCIN  Take 1 capsule (100 mg total) one hour prior to any dental procedure   GAVISCON PO Take 1 tablet by mouth 4 (four) times daily as needed (acid reflux).   Ginkgo Biloba 40 MG Tabs Take 1 tablet by mouth daily.   lidocaine  5 % Commonly known as: Lidoderm  Place 1 patch onto the skin daily. Remove & Discard patch within 12 hours or as directed by MD What changed:  when to take this reasons to take this   MAGNESIUM  PO Take 1 tablet by mouth daily.   montelukast  10 MG tablet Commonly known as: SINGULAIR  Take 10 mg by mouth at bedtime.   pantoprazole  40 MG tablet Commonly known as: PROTONIX  Take 40 mg by mouth at bedtime.   psyllium 0.52 g capsule Commonly known as: REGULOID Take 3 capsules by mouth daily.   rosuvastatin 20 MG tablet Commonly known as: CRESTOR Take 1 tablet (20 mg total) by mouth daily.   SUPER B-COMPLEX + VITAMIN C PO Take 1 tablet by mouth daily at 6 (six) AM.   VITAMIN D PO Take 1 tablet by mouth See admin instructions. Takes 1 tablet on "Sunday and 1 tablet on Thursday        Follow-up Information     [facility]. Schedule an appointment as soon as possible for a visit.   Specialty: Rehabilitation Contact information: [address] [place] [address] [phone]               Discharge Exam: BP 134/70 (BP Location: Right Arm)   Pulse 66   Temp 98.2 F (36.8 C) (Oral)   Resp 18   Ht 5' 2 (1.575 m)   Wt 74.5 kg   SpO2 98%   BMI 30.04 kg/m   General exam: Appears calm and comfortable. Respiratory system: Clear to auscultation. Respiratory effort normal. Cardiovascular system: S1 & S2 heard, RRR. No murmur. Gastrointestinal system: Abdomen is nondistended,  soft and nontender. Normal bowel sounds heard. Central nervous system: Alert and oriented. No focal neurological deficits. Musculoskeletal: No edema. No calf tenderness Psychiatry: Judgement and insight appear normal. Mood & affect appropriate.   Condition at discharge: stable  The results of significant diagnostics from this hospitalization (including imaging, microbiology, ancillary and laboratory) are listed below for reference.   Imaging Studies: ECHOCARDIOGRAM COMPLETE Result Date: 05/10/2024    ECHOCARDIOGRAM REPORT   Patient Name:   Anne Hogan Date of Exam: 05/10/2024 Medical Rec #:  [id number]     Height:       62.0 in Accession #:    [id number]    Weight:       164.2 lb Date of Birth:  07/28/1945      BSA:          1.758 m Patient Age:    78 years      BP:           13" 8/74 mmHg Patient Gender:  F             HR:           58 bpm. Exam Location:  Inpatient Procedure: 2D Echo, 3D Echo, Cardiac Doppler, Color Doppler and Strain Analysis            (Both Spectral and Color Flow Doppler were utilized during            procedure). Indications:    TIA G45.9  History:        Patient has prior history of Echocardiogram examinations, most                 recent 05/21/2022. CHF, PFO Closure, CKD and TIA, Mitral Valve                 Disease and s/p MVR, there is a 4D annuloplasty Ring present in                 the mitral position. Procedure date: 08/04/2019,                 Signs/Symptoms:Murmur; Risk Factors:Sleep Apnea.                  Mitral Valve: prosthetic annuloplasty ring valve is present in                 the mitral position. Procedure Date: 08/04/2019.  Sonographer:    Koleen Popper RDCS Referring Phys: [id number] TIMOTHY S OPYD  Sonographer Comments: Image acquisition challenging due to respiratory motion. Global longitudinal strain was attempted. IMPRESSIONS  1. Left ventricular ejection fraction, by estimation, is 55 to 60%. The left ventricle has normal function. The left ventricle has no  regional wall motion abnormalities. Left ventricular diastolic function could not be evaluated. The average left ventricular global longitudinal strain is -14.2 %. The global longitudinal strain is abnormal.  2. Right ventricular systolic function is normal. The right ventricular size is normal. There is normal pulmonary artery systolic pressure.  3. Left atrial size was moderately dilated.  4. The mitral valve has been repaired/replaced. Trivial mitral valve regurgitation. No evidence of mitral stenosis. There is a prosthetic annuloplasty ring present in the mitral position. Procedure Date: 08/04/2019.  5. Tricuspid valve regurgitation is mild to moderate.  6. The aortic valve is tricuspid. There is mild calcification of the aortic valve. Aortic valve regurgitation is mild. No aortic stenosis is present.  7. The inferior vena cava is normal in size with greater than 50% respiratory variability, suggesting right atrial pressure of 3 mmHg. Comparison(s): Changes from prior study are noted. EF improved compared to prior. FINDINGS  Left Ventricle: Left ventricular ejection fraction, by estimation, is 55 to 60%. The left ventricle has normal function. The left ventricle has no regional wall motion abnormalities. The average left ventricular global longitudinal strain is -14.2 %. Strain was performed and the global longitudinal strain is abnormal. The left ventricular internal cavity size was normal in size. There is no left ventricular hypertrophy. Left ventricular diastolic function could not be evaluated due to mitral valve repair. Left ventricular diastolic function could not be evaluated. Right Ventricle: The right ventricular size is normal. No increase in right ventricular wall thickness. Right ventricular systolic function is normal. There is normal pulmonary artery systolic pressure. The tricuspid regurgitant velocity is 2.68 m/s, and  with an assumed right atrial pressure of 3 mmHg, the estimated right  ventricular systolic pressure is 31.7 mmHg. Left Atrium: Left atrial size was  moderately dilated. Right Atrium: Right atrial size was normal in size. Pericardium: There is no evidence of pericardial effusion. Mitral Valve: The mitral valve has been repaired/replaced. Trivial mitral valve regurgitation. There is a prosthetic annuloplasty ring present in the mitral position. Procedure Date: 08/04/2019. No evidence of mitral valve stenosis. MV peak gradient, 3.1 mmHg. The mean mitral valve gradient is 1.0 mmHg. Tricuspid Valve: The tricuspid valve is normal in structure. Tricuspid valve regurgitation is mild to moderate. No evidence of tricuspid stenosis. Aortic Valve: The aortic valve is tricuspid. There is mild calcification of the aortic valve. Aortic valve regurgitation is mild. Aortic regurgitation PHT measures 684 msec. No aortic stenosis is present. Aortic valve mean gradient measures 2.0 mmHg. Aortic valve peak gradient measures 4.2 mmHg. Aortic valve area, by VTI measures 3.70 cm. Pulmonic Valve: The pulmonic valve was grossly normal. Pulmonic valve regurgitation is trivial. No evidence of pulmonic stenosis. Aorta: The aortic root and ascending aorta are structurally normal, with no evidence of dilitation. Venous: The inferior vena cava is normal in size with greater than 50% respiratory variability, suggesting right atrial pressure of 3 mmHg. IAS/Shunts: The atrial septum is grossly normal. Additional Comments: 3D was performed not requiring image post processing on an independent workstation and was normal.  LEFT VENTRICLE PLAX 2D LVIDd:         5.00 cm      Diastology LVIDs:         2.70 cm      LV e' medial:    4.87 cm/s LV PW:         0.90 cm      LV E/e' medial:  12.4 LV IVS:        1.00 cm      LV e' lateral:   7.46 cm/s LVOT diam:     2.30 cm      LV E/e' lateral: 8.1 LV SV:         91 LV SV Index:   52           2D Longitudinal Strain LVOT Area:     4.15 cm     2D Strain GLS (A4C):   -14.2 %                              2D Strain GLS (A3C):   -16.0 %                             2D Strain GLS (A2C):   -12.2 % LV Volumes (MOD)            2D Strain GLS Avg:     -14.2 % LV vol d, MOD A4C: 104.0 ml LV vol s, MOD A4C: 45.5 ml LV SV MOD A4C:     104.0 ml                             3D Volume EF:                             3D EF:        55 %                             LV EDV:  114 ml                             LV ESV:       52 ml                             LV SV:        62 ml RIGHT VENTRICLE             IVC RV Basal diam:  3.20 cm     IVC diam: 1.30 cm RV S prime:     12.40 cm/s TAPSE (M-mode): 2.2 cm LEFT ATRIUM             Index        RIGHT ATRIUM           Index LA diam:        5.60 cm 3.19 cm/m   RA Area:     11.10 cm LA Vol (A2C):   48.0 ml 27.30 ml/m  RA Volume:   22.90 ml  13.02 ml/m LA Vol (A4C):   73.0 ml 41.52 ml/m LA Biplane Vol: 59.4 ml 33.79 ml/m  AORTIC VALVE                    PULMONIC VALVE AV Area (Vmax):    3.44 cm     PR End Diast Vel: 1.25 msec AV Area (Vmean):   3.44 cm AV Area (VTI):     3.70 cm AV Vmax:           102.00 cm/s AV Vmean:          69.000 cm/s AV VTI:            0.246 m AV Peak Grad:      4.2 mmHg AV Mean Grad:      2.0 mmHg LVOT Vmax:         84.40 cm/s LVOT Vmean:        57.200 cm/s LVOT VTI:          0.219 m LVOT/AV VTI ratio: 0.89 AI PHT:            684 msec AR Vena Contracta: 0.30 cm  AORTA Ao Root diam: 2.90 cm Ao Asc diam:  3.20 cm MITRAL VALVE               TRICUSPID VALVE MV Area (PHT): 2.34 cm    TR Peak grad:   28.7 mmHg MV Area VTI:   3.79 cm    TR Vmax:        268.00 cm/s MV Peak grad:  3.1 mmHg MV Mean grad:  1.0 mmHg    SHUNTS MV Vmax:       0.88 m/s    Systemic VTI:  0.22 m MV Vmean:      47.5 cm/s   Systemic Diam: 2.30 cm MV Decel Time: 324 msec MR Peak grad: 102.0 mmHg MR Vmax:      505.00 cm/s MV E velocity: 60.50 cm/s MV A velocity: 81.70 cm/s MV E/A ratio:  0.74 Anne Bruckner MD Electronically signed by Anne Bruckner MD Signature  Date/Time: 05/10/2024/2:05:12 PM    Final    EEG adult Result Date: 05/10/2024 Anne Anne KIDD, MD     05/10/2024  1:32 PM Patient Name: Anne Hogan MRN: [id number] Epilepsy Attending: Arlin Hogan Anne Referring Physician/Provider: everitt Clint Kill, Cortney  E, NP Date: 05/10/2024 Duration: 22.54 mins Patient history: 78yo f with transient expressive aphasia. EEG to evaluate for seizure Level of alertness: Awake, asleep AEDs during EEG study: None Technical aspects: This EEG study was done with scalp electrodes positioned according to the 10-20 International system of electrode placement. Electrical activity was reviewed with band pass filter of 1-70Hz , sensitivity of 7 uV/mm, display speed of 31mm/sec with a 60Hz  notched filter applied as appropriate. EEG data were recorded continuously and digitally stored.  Video monitoring was available and reviewed as appropriate. Description: The posterior dominant rhythm consists of 8 Hz activity of moderate voltage (25-35 uV) seen predominantly in posterior head regions, symmetric and reactive to eye opening and eye closing. Sleep was characterized by vertex waves, sleep spindles (12 to 14 Hz), maximal frontocentral region. Physiologic photic driving was not seen during photic stimulation.  Hyperventilation was not performed.   IMPRESSION: This study is within normal limits. No seizures or epileptiform discharges were seen throughout the recording. A normal interictal EEG does not exclude the diagnosis of epilepsy. Anne Hogan   MR BRAIN WO CONTRAST Result Date: 05/09/2024 EXAM: MRI BRAIN WITHOUT CONTRAST 05/09/2024 11:33:16 PM TECHNIQUE: Multiplanar multisequence MRI of the head/brain was performed without the administration of intravenous contrast. COMPARISON: 10/23/2015 CLINICAL HISTORY: Neuro deficit, acute, stroke suspected. FINDINGS: BRAIN AND VENTRICLES: No acute infarct. No intracranial hemorrhage. No mass. No midline shift. No hydrocephalus. Multifocal  hyperintense T2-weighted signal within the cerebral white matter, most commonly due to chronic small vessel disease. Mild volume loss. The sella is unremarkable. Normal flow voids. ORBITS: No acute abnormality. SINUSES AND MASTOIDS: No acute abnormality. BONES AND SOFT TISSUES: Normal marrow signal. No acute soft tissue abnormality. IMPRESSION: 1. No acute intracranial abnormality. 2. Multifocal T2 hyperintense white matter signal, most consistent with chronic small vessel disease. Electronically signed by: Franky Stanford MD 05/09/2024 11:42 PM EDT RP Workstation: [id number]   CT ANGIO HEAD NECK W WO CM Result Date: 05/09/2024 EXAM: CTA HEAD AND NECK WITH AND WITHOUT 05/09/2024 10:07:43 PM TECHNIQUE: CTA of the head and neck was performed with and without the administration of 75 mL of iohexol  (OMNIPAQUE ) 350 MG/ML injection. Multiplanar 2D and/or 3D reformatted images are provided for review. Automated exposure control, iterative reconstruction, and/or weight based adjustment of the mA/kV was utilized to reduce the radiation dose to as low as reasonably achievable. Stenosis of the internal carotid arteries measured using NASCET criteria. COMPARISON: None available CLINICAL HISTORY: Stroke/TIA, determine embolic source. Patient states she had trouble speaking her words earlier today; Hx of possible TIA/cancer found in the lung; 70 ml omni 350. FINDINGS: CTA NECK: AORTIC ARCH AND ARCH VESSELS: No dissection or arterial injury. No significant stenosis of the brachiocephalic or subclavian arteries. CERVICAL CAROTID ARTERIES: No dissection, arterial injury, or hemodynamically significant stenosis by NASCET criteria. CERVICAL VERTEBRAL ARTERIES: No dissection, arterial injury, or significant stenosis. LUNGS AND MEDIASTINUM: Ground glass opacity of the left lung apex measuring 8 mm. According to the Fleischner Society pulmonary nodule recommendations, the finding is consistent with a single ground glass nodule 6 mm  and the recommendation is CT at 6-12 months, then if persistent, CT every 2 years until 5 years. SOFT TISSUES: No acute abnormality. BONES: No acute abnormality. CTA HEAD: ANTERIOR CIRCULATION: No significant stenosis of the internal carotid arteries. No significant stenosis of the anterior cerebral arteries. No significant stenosis of the middle cerebral arteries. No aneurysm. POSTERIOR CIRCULATION: No significant stenosis of the posterior cerebral arteries. No significant stenosis of  the basilar artery. No significant stenosis of the vertebral arteries. No aneurysm. OTHER: No dural venous sinus thrombosis on this non-dedicated study. IMPRESSION: 1. No large vessel occlusion, hemodynamically significant stenosis, or aneurysm in the head or neck. 2. 8 mm ground-glass nodule in the left lung apex. Recommend chest CT at 612 months; if persistent, chest CT every 2 years until 5 years. Electronically signed by: Franky Stanford MD 05/09/2024 10:21 PM EDT RP Workstation: [id number]   CT HEAD WO CONTRAST Result Date: 05/09/2024 CLINICAL DATA:  Aphasia resolved at time of triaged EXAM: CT HEAD WITHOUT CONTRAST TECHNIQUE: Contiguous axial images were obtained from the base of the skull through the vertex without intravenous contrast. RADIATION DOSE REDUCTION: This exam was performed according to the departmental dose-optimization program which includes automated exposure control, adjustment of the mA and/or kV according to patient size and/or use of iterative reconstruction technique. COMPARISON:  MRI brain 11/02/2015 FINDINGS: Brain: No acute territorial infarction, hemorrhage or intracranial mass. Moderate atrophy. Mild chronic small vessel ischemic changes of the white matter. Nonenlarged ventricles Vascular: No hyperdense vessels.  No unexpected calcification Skull: Normal. Negative for fracture or focal lesion. Sinuses/Orbits: No acute finding. Other: None. IMPRESSION: 1. No CT evidence for acute intracranial  abnormality. 2. Atrophy and chronic small vessel ischemic changes of the white matter. Electronically Signed   By: Luke Bun M.D.   On: 05/09/2024 15:59    Microbiology: Results for orders placed or performed during the hospital encounter of 11/20/19  SARS CORONAVIRUS 2 (TAT 6-24 HRS) Nasopharyngeal Nasopharyngeal Swab     Status: None   Collection Time: 11/20/19 10:25 AM   Specimen: Nasopharyngeal Swab  Result Value Ref Range Status   SARS Coronavirus 2 NEGATIVE NEGATIVE Final    Comment: (NOTE) SARS-CoV-2 target nucleic acids are NOT DETECTED. The SARS-CoV-2 RNA is generally detectable in upper and lower respiratory specimens during the acute phase of infection. Negative results do not preclude SARS-CoV-2 infection, do not rule out co-infections with other pathogens, and should not be used as the sole basis for treatment or other patient management decisions. Negative results must be combined with clinical observations, patient history, and epidemiological information. The expected result is Negative. Fact Sheet for Patients: HairSlick.no Fact Sheet for Healthcare Providers: quierodirigir.com This test is not yet approved or cleared by the [place]  FDA and  has been authorized for detection and/or diagnosis of SARS-CoV-2 by FDA under an Emergency Use Authorization (EUA). This EUA will remain  in effect (meaning this test can be used) for the duration of the COVID-19 declaration under Section 56 4(b)(1) of the Act, 21 U.S.C. section 360bbb-3(b)(1), unless the authorization is terminated or revoked sooner. Performed at [facility], [address], [place], [place] [address]     Labs: CBC: Recent Labs  Lab 05/09/24 1508 05/10/24 0453  WBC 6.9 5.6  NEUTROABS 4.0  --   HGB 14.1 13.3  HCT 42.3 39.4  MCV 89.4 89.3  PLT 332 286   Basic Metabolic Panel: Recent Labs  Lab 05/09/24 1508 05/10/24 0453  NA  141 139  K 4.2 3.7  CL 106 109  CO2 23 22  GLUCOSE 112* 92  BUN 14 12  CREATININE 1.09* 0.98  CALCIUM 9.7 8.7*  MG  --  2.1   Liver Function Tests: Recent Labs  Lab 05/09/24 1508  AST 22  ALT 23  ALKPHOS 81  BILITOT 0.7  PROT 7.2  ALBUMIN  4.2   CBG: Recent Labs  Lab 05/09/24  1506  GLUCAP 109*    Discharge time spent: 35 minutes.  Signed: Elgin Lam, MD [facility] 05/10/2024

## 2024-05-10 NOTE — Progress Notes (Signed)
 EEG complete - results pending

## 2024-05-10 NOTE — Consult Note (Signed)
 NEUROLOGY CONSULT NOTE   Date of service: May 10, 2024 Patient Name: Anne Hogan MRN:  [id number] DOB:  1946-07-06 Chief Complaint: Speech change Requesting Provider: Charlton Evalene RAMAN, MD  History of Present Illness  Anne Hogan is a 78 y.o. female with hx of mitral valve prolapse and PFO closure in 2021 during which she had a brief postop run of A-fib who presents with transient speech changes.  She states that she was on the phone having a conversation when suddenly she could not form the words that she wanted to say.  She thinks that it was more an enunciation issue as opposed to difficulty with word finding, but is not completely sure.  She states that people could understand her, but only with great difficulty.   She denies any concurrent visual change, weakness, numbness, difficulty with comprehension, or other symptoms.  She does question whether her balance was slightly off, but this has been a chronic problem for her because of foot issues.  NIHSS components Score: Comment  1a Level of Conscious 0[]  1[]  2[]  3[]      1b LOC Questions 0[]  1[]  2[]       1c LOC Commands 0[]  1[]  2[]       2 Best Gaze 0[]  1[]  2[]       3 Visual 0[]  1[]  2[]  3[]      4 Facial Palsy 0[]  1[]  2[]  3[]      5a Motor Arm - left 0[]  1[]  2[]  3[]  4[]  UN[]    5b Motor Arm - Right 0[]  1[]  2[]  3[]  4[]  UN[]    6a Motor Leg - Left 0[]  1[]  2[]  3[]  4[]  UN[]    6b Motor Leg - Right 0[]  1[]  2[]  3[]  4[]  UN[]    7 Limb Ataxia 0[]  1[]  2[]  UN[]      8 Sensory 0[]  1[]  2[]  UN[]      9 Best Language 0[]  1[]  2[]  3[]      10 Dysarthria 0[]  1[]  2[]  UN[]      11 Extinct. and Inattention 0[]  1[]  2[]       TOTAL: 0       Past History   Past Medical History:  Diagnosis Date   ADD (attention deficit disorder)    Arthritis    right knee (04/19/2013)   Asthma 04/03/2019   CHF (congestive heart failure) (HCC)    Chronic diastolic congestive heart failure (HCC)    Claustrophobia    Depression    Dyspnea    Dysrhythmia     Fracture of right tibial plateau 05/2016   GERD (gastroesophageal reflux disease)    Heart murmur    Incidental pulmonary nodule, greater than or equal to 8mm 06/06/2019   Needs f/u CT in 3 months   Migraine    bad when I was in 5th or 6th grade; I can usually ward them off by resting my head now (04/19/2013)   MVP (mitral valve prolapse)    S/P minimally-invasive mitral valve repair 08/03/2019   Complex valvuloplasty including artificial Gore-tex neochords x12 and Sorin Memo 4D ring annuloplasty, size 38 mm   S/P patent foramen ovale closure 08/03/2019   Sleep apnea    has not used mouth guard in 5 years no cpap used    Squamous carcinoma ?1999   upper left groin (04/19/2013)    Past Surgical History:  Procedure Laterality Date   CARDIAC CATHETERIZATION  05/26/2019   CHOLECYSTECTOMY  04/18/2013   CHOLECYSTECTOMY N/A 04/18/2013   Procedure: LAPAROSCOPIC CHOLECYSTECTOMY WITH INTRAOPERATIVE CHOLANGIOGRAM;  Surgeon: Lynda Leos, MD;  Location: [facility] OR;  Service: General;  Laterality: N/A;   COLONOSCOPY W/ POLYPECTOMY     EYE SURGERY Bilateral FALL  2016   IOC FOR CATARACTS   FRACTURE SURGERY     INTERCOSTAL NERVE BLOCK Left 11/23/2019   Procedure: Intercostal Nerve Block;  Surgeon: Kerrin Elspeth BROCKS, MD;  Location: [facility] OR;  Service: Thoracic;  Laterality: Left;   JOINT REPLACEMENT     KNEE ARTHROSCOPY Right 12/2007   meniscus repair (04/19/2013)   LYMPH NODE DISSECTION Left 11/23/2019   Procedure: Lymph Node Dissection;  Surgeon: Kerrin Elspeth BROCKS, MD;  Location: [facility] OR;  Service: Thoracic;  Laterality: Left;   MITRAL VALVE REPAIR Right 08/03/2019   Procedure: MINIMALLY INVASIVE MITRAL VALVE REPAIR (MVR) using Memo 4D 38 MM Mitral Valve.;  Surgeon: Dusty Sudie DEL, MD;  Location: [facility] OR;  Service: Open Heart Surgery;  Laterality: Right;   REPAIR OF PATENT FORAMEN OVALE N/A 08/03/2019   Procedure: CLOSURE OF PATENT FORAMEN OVALE;  Surgeon: Dusty Sudie DEL, MD;  Location: [facility] OR;   Service: Open Heart Surgery;  Laterality: N/A;   RETINAL TEAR REPAIR CRYOTHERAPY Left ~ 1997   RIGHT/LEFT HEART CATH AND CORONARY ANGIOGRAPHY N/A 05/26/2019   Procedure: RIGHT/LEFT HEART CATH AND CORONARY ANGIOGRAPHY;  Surgeon: Dann Candyce RAMAN, MD;  Location: [facility] INVASIVE CV LAB;  Service: Cardiovascular;  Laterality: N/A;   TEE WITHOUT CARDIOVERSION N/A 05/26/2019   Procedure: TRANSESOPHAGEAL ECHOCARDIOGRAM (TEE);  Surgeon: Delford Maude BROCKS, MD;  Location: [facility] ENDOSCOPY;  Service: Cardiovascular;  Laterality: N/A;   TEE WITHOUT CARDIOVERSION N/A 08/03/2019   Procedure: TRANSESOPHAGEAL ECHOCARDIOGRAM (TEE);  Surgeon: Dusty Sudie DEL, MD;  Location: [facility] OR;  Service: Open Heart Surgery;  Laterality: N/A;   TONSILLECTOMY  ~ 1952   TOTAL KNEE ARTHROPLASTY Right 09/21/2016   Procedure: RIGHT TOTAL KNEE ARTHROPLASTY;  Surgeon: Dempsey Moan, MD;  Location: [facility] ORS;  Service: Orthopedics;  Laterality: Right;  requests with abductor block   VARICOSE VEIN SURGERY Right 2000's   VASCULAR SURGERY     WRIST SURGERY Right 1975   had a knot taken off (04/19/2013)    Family History: Family History  Problem Relation Age of Onset   Tremor Mother    Osteoporosis Mother    Sarcoidosis Mother    Hypertension Mother    Congestive Heart Failure Father    Heart attack Father    Diabetes Mellitus II Father    Diabetes Mellitus II Brother    Sleep apnea Neg Hx     Social History  reports that she quit smoking about 37 years ago. Her smoking use included cigarettes. She started smoking about 57 years ago. She has a 30 pack-year smoking history. She has never used smokeless tobacco. She reports current alcohol  use. She reports that she does not use drugs.  Allergies  Allergen Reactions   Penicillins Rash    Has patient had a PCN reaction causing immediate rash, facial/tongue/throat swelling, SOB or lightheadedness with hypotension: No Has patient had a PCN reaction causing severe rash involving  mucus membranes or skin necrosis: no Has patient had a PCN reaction that required hospitalization: No Has patient had a PCN reaction occurring within the last 10 years: no If all of the above answers are NO, then may proceed with Cephalosporin use.     Codeine Other (See Comments)    Possible sensitivity, patient not sure   Milk-Related Compounds Nausea And Vomiting and Other (See Comments)    Can have ice cream, pt just  doesn't want to drink milk   Morphine  And Codeine     strange sensation across midsection and chest    Medications   Current Facility-Administered Medications:     stroke: early stages of recovery book, , Does not apply, Once, Opyd, Timothy S, MD   0.9 %  sodium chloride  infusion, , Intravenous, Continuous, Opyd, Evalene RAMAN, MD, Last Rate: 40 mL/hr at 05/10/24 0011, New Bag at 05/10/24 0011   acetaminophen  (TYLENOL ) tablet 650 mg, 650 mg, Oral, Q4H PRN **OR** acetaminophen  (TYLENOL ) 160 MG/5ML solution 650 mg, 650 mg, Per Tube, Q4H PRN **OR** acetaminophen  (TYLENOL ) suppository 650 mg, 650 mg, Rectal, Q4H PRN, Opyd, Timothy S, MD   aspirin  EC tablet 81 mg, 81 mg, Oral, Daily, Opyd, Timothy S, MD   clopidogrel (PLAVIX) tablet 75 mg, 75 mg, Oral, Daily, Opyd, Timothy S, MD   enoxaparin  (LOVENOX ) injection 40 mg, 40 mg, Subcutaneous, Q24H, Opyd, Timothy S, MD, 40 mg at 2024-06-02 2141   fluticasone  furoate-vilanterol (BREO ELLIPTA ) 100-25 MCG/ACT 1 puff, 1 puff, Inhalation, Daily, Opyd, Timothy S, MD   Influenza vac split trivalent PF (FLUZONE HIGH-DOSE) injection 0.5 mL, 0.5 mL, Intramuscular, Tomorrow-1000, Opyd, Timothy S, MD   LORazepam (ATIVAN) injection 1 mg, 1 mg, Intravenous, Once PRN, Opyd, Timothy S, MD   montelukast  (SINGULAIR ) tablet 10 mg, 10 mg, Oral, QHS, Opyd, Timothy S, MD, 10 mg at 2024/06/02 2141   pantoprazole  (PROTONIX ) EC tablet 40 mg, 40 mg, Oral, QHS, Opyd, Timothy S, MD   senna-docusate (Senokot-S) tablet 1 tablet, 1 tablet, Oral, QHS PRN, Opyd,  Evalene RAMAN, MD   sodium chloride  flush (NS) 0.9 % injection 3 mL, 3 mL, Intravenous, Once, Simon Lavonia SAILOR, MD  Vitals   Vitals:   06-02-2024 1945 02-Jun-2024 2020 06-02-2024 2125 06/02/24 2355  BP: 131/74 (!) 150/75  (!) 148/79  Pulse: 70 74  69  Resp: 11 18  18   Temp: 98.9 F (37.2 C) (!) 97.5 F (36.4 C)  97.7 F (36.5 C)  TempSrc: Oral Oral  Oral  SpO2: 98% 99% 99% 96%  Weight:  74.5 kg    Height:  5' 2 (1.575 m)      Body mass index is 30.04 kg/m.   Physical Exam   Constitutional: Appears well-developed and well-nourished.   Neurologic Examination    Neuro: Mental Status: Patient is awake, alert, oriented to person, place, month, year, and situation. Patient is able to give a clear and coherent history. No signs of aphasia or neglect Cranial Nerves: II: Visual Fields are full. Pupils are equal, round, and reactive to light.   III,IV, VI: EOMI without ptosis or diploplia.  V: Facial sensation is symmetric to temperature VII: Facial movement is symmetric.  VIII: hearing is intact to voice X: Uvula elevates symmetrically XII: tongue is midline without atrophy or fasciculations.  Motor: Tone is normal. Bulk is normal. 5/5 strength was present in all four extremities.  Sensory: Sensation is symmetric to light touch and temperature in the arms and legs. Cerebellar: FNF and HKS are intact bilaterally      Labs/Imaging/Neurodiagnostic studies   CBC:  Recent Labs  Lab 2024/06/02 1508  WBC 6.9  NEUTROABS 4.0  HGB 14.1  HCT 42.3  MCV 89.4  PLT 332   Basic Metabolic Panel:  Lab Results  Component Value Date   NA 141 02-Jun-2024   K 4.2 2024/06/02   CO2 23 June 02, 2024   GLUCOSE 112 (H) Jun 02, 2024   BUN 14 2024-06-02   CREATININE 1.09 (  H) 05/09/2024   CALCIUM 9.7 05/09/2024   GFRNONAA 52 (L) 05/09/2024   GFRAA 48 (L) 12/25/2019   Lipid Panel: No results found for: LDLCALC HgbA1c:  Lab Results  Component Value Date   HGBA1C 5.2 07/24/2019   Urine Drug  Screen: No results found for: LABOPIA, COCAINSCRNUR, LABBENZ, AMPHETMU, THCU, LABBARB  Alcohol  Level     Component Value Date/Time   [facility] <15 05/09/2024 1515   INR  Lab Results  Component Value Date   INR 0.9 05/09/2024   APTT  Lab Results  Component Value Date   APTT 35 05/09/2024     CT angio Head and Neck with contrast(Personally reviewed): No embolic source  MRI Brain(Personally reviewed): No acute infarct   ASSESSMENT   Anne Hogan is a 78 y.o. female with transient speech difficulty, unclear if isolated dysarthria or possible mild expressive aphasia.  The abrupt onset and offset coupled with the severity of the description make transient ischemic attack the most likely etiology.  She is currently on aspirin  monotherapy, and I would favor 3 weeks of DAPT.  She will also need a cardiac workup given her history as well.   RECOMMENDATIONS  - HgbA1c, fasting lipid panel - Frequent neuro checks - Echocardiogram - Prophylactic therapy-Antiplatelet med: Aspirin  - dose 81mg  and plavix 75mg  daily  after 300mg  load  - Risk factor modification - Telemetry monitoring - PT consult, OT consult, Speech consult - Stroke team to follow  ______________________________________________________________________    Signed, Aisha Seals, MD [facility]

## 2024-05-11 ENCOUNTER — Other Ambulatory Visit (HOSPITAL_COMMUNITY): Payer: Self-pay

## 2024-05-11 NOTE — Hospital Course (Signed)
 Anne Hogan is a 78 y.o. female with a history of OSA, chronic heart failure preserved EF, CKD stage IIIa, stage I lung cancer.  Patient presented secondary to speech impairment concerning for possible stroke.  Workup in overall presentation more consistent with TIA.  Imaging was negative for acute stroke.  Neurology recommendation for Eliquis on discharge in addition to Crestor for hyperlipidemia.

## 2024-05-16 DIAGNOSIS — K219 Gastro-esophageal reflux disease without esophagitis: Secondary | ICD-10-CM | POA: Diagnosis not present

## 2024-05-16 DIAGNOSIS — Z85118 Personal history of other malignant neoplasm of bronchus and lung: Secondary | ICD-10-CM | POA: Diagnosis not present

## 2024-05-16 DIAGNOSIS — G459 Transient cerebral ischemic attack, unspecified: Secondary | ICD-10-CM | POA: Diagnosis not present

## 2024-05-16 DIAGNOSIS — M76821 Posterior tibial tendinitis, right leg: Secondary | ICD-10-CM | POA: Diagnosis not present

## 2024-05-16 DIAGNOSIS — N1831 Chronic kidney disease, stage 3a: Secondary | ICD-10-CM | POA: Diagnosis not present

## 2024-05-16 DIAGNOSIS — G4733 Obstructive sleep apnea (adult) (pediatric): Secondary | ICD-10-CM | POA: Diagnosis not present

## 2024-05-16 DIAGNOSIS — R2689 Other abnormalities of gait and mobility: Secondary | ICD-10-CM | POA: Diagnosis not present

## 2024-05-16 DIAGNOSIS — M2141 Flat foot [pes planus] (acquired), right foot: Secondary | ICD-10-CM | POA: Diagnosis not present

## 2024-05-16 DIAGNOSIS — E785 Hyperlipidemia, unspecified: Secondary | ICD-10-CM | POA: Diagnosis not present

## 2024-05-16 DIAGNOSIS — I4891 Unspecified atrial fibrillation: Secondary | ICD-10-CM | POA: Diagnosis not present

## 2024-05-16 DIAGNOSIS — J45909 Unspecified asthma, uncomplicated: Secondary | ICD-10-CM | POA: Diagnosis not present

## 2024-05-17 ENCOUNTER — Telehealth: Payer: Self-pay | Admitting: Neurology

## 2024-05-17 ENCOUNTER — Ambulatory Visit: Payer: Self-pay | Admitting: Neurology

## 2024-05-17 ENCOUNTER — Encounter: Payer: Self-pay | Admitting: Neurology

## 2024-05-17 VITALS — BP 94/72 | HR 77 | Ht 62.0 in | Wt 163.6 lb

## 2024-05-17 DIAGNOSIS — G4730 Sleep apnea, unspecified: Secondary | ICD-10-CM | POA: Diagnosis not present

## 2024-05-17 DIAGNOSIS — H93A2 Pulsatile tinnitus, left ear: Secondary | ICD-10-CM | POA: Diagnosis not present

## 2024-05-17 DIAGNOSIS — C3492 Malignant neoplasm of unspecified part of left bronchus or lung: Secondary | ICD-10-CM

## 2024-05-17 DIAGNOSIS — I749 Embolism and thrombosis of unspecified artery: Secondary | ICD-10-CM

## 2024-05-17 DIAGNOSIS — Z9889 Other specified postprocedural states: Secondary | ICD-10-CM | POA: Diagnosis not present

## 2024-05-17 DIAGNOSIS — Z8774 Personal history of (corrected) congenital malformations of heart and circulatory system: Secondary | ICD-10-CM | POA: Diagnosis not present

## 2024-05-17 DIAGNOSIS — G459 Transient cerebral ischemic attack, unspecified: Secondary | ICD-10-CM | POA: Insufficient documentation

## 2024-05-17 DIAGNOSIS — I5032 Chronic diastolic (congestive) heart failure: Secondary | ICD-10-CM

## 2024-05-17 DIAGNOSIS — Z902 Acquired absence of lung [part of]: Secondary | ICD-10-CM

## 2024-05-17 MED ORDER — CLOPIDOGREL BISULFATE 75 MG PO TABS: 75.0000 mg | ORAL_TABLET | Freq: Every day | ORAL | 0 refills | Status: AC

## 2024-05-17 NOTE — Telephone Encounter (Signed)
 Referral for audiology fax to [facility]. Phone: [phone], Fax: [phone].

## 2024-05-17 NOTE — Patient Instructions (Addendum)
 Transient Ischemic Attack A transient ischemic attack (TIA) causes the same symptoms as a stroke, but the symptoms go away quickly. A TIA happens when blood flow to the brain is blocked. Having a TIA means you may be at risk for a stroke. A TIA is a medical emergency. What are the causes? A TIA is caused by a blocked artery in the head or neck. This means the brain does not get the blood supply it needs. A blockage can be caused by: Fatty buildup in an artery in the head or neck. A blood clot. A tear in an artery. Irritation and swelling (inflammation) of an artery. Sometimes the cause is not known. What increases the risk? Certain things may make you more likely to have a TIA. Some of these are things that you can change, such as: Using products that have nicotine or tobacco. Not being active. Drinking too much alcohol . Using recreational drugs. Health conditions that may increase your risk include: High blood pressure. High cholesterol. Diabetes. Heart disease. A heartbeat that is not regular (atrial fibrillation). Sickle cell disease. Problems with blood clotting. Other risk factors include: Being over the age of 64. Being female. Being very overweight. Sleep problems (sleep apnea). Having a family history of stroke. Having had blood clots, stroke, TIA, or heart attack in the past. What are the signs or symptoms? The symptoms of a TIA are like those of a stroke. They can include: Weakness or loss of feeling in your face, arm, or leg. This often happens on one side of your body. Trouble walking. Trouble moving your arms or legs. Trouble talking or understanding what people are saying. Problems with how you see. Feeling dizzy. Feeling confused. Loss of balance or coordination. Feeling like you may vomit (nausea) or vomiting. Having a very bad headache. If you can, note what time you started to have symptoms. Tell your doctor. How is this treated? The goal of treatment is  to lower the risk for a stroke. This may include: Changes to diet and lifestyle, such as getting regular exercise and stopping smoking. Taking medicines to: Thin the blood. Lower blood pressure. Lower cholesterol. Treating other health conditions, such as diabetes. If testing shows that an artery in your brain is narrow, your doctor may recommend a procedure to: Take the blockage out of your artery. Open or widen an artery in your neck (carotid angioplasty and stenting). Follow these instructions at home: Medicines Take over-the-counter and prescription medicines only as told by your doctor. If you were told to take aspirin  or another medicine to thin your blood, use it exactly as told by your doctor. Taking too much of the medicine can cause bleeding. Taking too little of the medicine may not work to treat the problem. Eating and drinking  Eat 5 or more servings of fruits and vegetables each day. Follow instructions from your doctor about your diet. You may need to follow a certain diet to help lower your risk of a stroke. You may need to: Eat a diet that is low in fat and salt. Eat foods with a lot of fiber. Limit carbohydrates and sugar. If you drink alcohol : Limit how much you have to: 0-1 drink a day for women who are not pregnant. 0-2 drinks a day for men. Know how much alcohol  is in a drink. In the [place] one drink equals one 12 oz bottle of beer (355 mL), one 5 oz glass of wine (148 mL), or one 1 oz glass of hard  liquor (44 mL). General instructions Keep a healthy weight. Try to get at least 30 minutes of exercise on most days. Get treatment if you have sleep problems. Do not smoke or use any products that contain nicotine or tobacco. If you need help quitting, ask your doctor. Do not use drugs. Keep all follow-up visits. Your doctor will want to know if you have any more symptoms and to check blood labs if any medicines were prescribed. Where to find more  information American Stroke Association: stroke.org Get help right away if: You have chest pain. You have a heartbeat that is not regular. You have any signs of a stroke. BE FAST is an easy way to remember the main warning signs: B - Balance. Dizziness, sudden trouble walking, or loss of balance. E - Eyes. Trouble seeing or a change in how you see. F - Face. Sudden weakness or loss of feeling of the face. The face or eyelid may droop on one side. A - Arms. Weakness or loss of feeling in an arm. This happens all of a sudden and most often on one side of the body. S - Speech. Sudden trouble speaking, slurred speech, or trouble understanding what people say. T - Time. Time to call emergency services. Write down what time symptoms started. You have other signs of a stroke, such as: A sudden, very bad headache with no known cause. Feeling like you may vomit. Vomiting. A seizure. These symptoms may be an emergency. Get help right away. Call 911. Do not wait to see if the symptoms will go away. Do not drive yourself to the hospital. This information is not intended to replace advice given to you by your health care provider. Make sure you discuss any questions you have with your health care provider. Document Revised: 12/19/2021 Document Reviewed: 12/19/2021 Elsevier Patient Education  2024 [employer]     DASH Eating Plan DASH stands for Dietary Approaches to Stop Hypertension. The DASH eating plan is a healthy eating plan that has been shown to: Lower high blood pressure (hypertension). Reduce your risk for type 2 diabetes, heart disease, and stroke. Help with weight loss. What are tips for following this plan? Reading food labels Check food labels for the amount of salt (sodium) per serving. Choose foods with less than 5 percent of the Daily Value (DV) of sodium. In general, foods with less than 300 milligrams (mg) of sodium per serving fit into this eating plan. To find whole  grains, look for the word whole as the first word in the ingredient list. Shopping Buy products labeled as low-sodium or no salt added. Buy fresh foods. Avoid canned foods and pre-made or frozen meals. Cooking Try not to add salt when you cook. Use salt-free seasonings or herbs instead of table salt or sea salt. Check with your health care provider or pharmacist before using salt substitutes. Do not fry foods. Cook foods in healthy ways, such as baking, boiling, grilling, roasting, or broiling. Cook using oils that are good for your heart. These include olive, canola, avocado, soybean, and sunflower oil. Meal planning  Eat a balanced diet. This should include: 4 or more servings of fruits and 4 or more servings of vegetables each day. Try to fill half of your plate with fruits and vegetables. 6-8 servings of whole grains each day. 6 or less servings of lean meat, poultry, or fish each day. 1 oz is 1 serving. A 3 oz (85 g) serving of meat is about the  same size as the palm of your hand. One egg is 1 oz (28 g). 2-3 servings of low-fat dairy each day. One serving is 1 cup (237 mL). 1 serving of nuts, seeds, or beans 5 times each week. 2-3 servings of heart-healthy fats. Healthy fats called omega-3 fatty acids are found in foods such as walnuts, flaxseeds, fortified milks, and eggs. These fats are also found in cold-water fish, such as sardines, salmon, and mackerel. Limit how much you eat of: Canned or prepackaged foods. Food that is high in trans fat, such as fried foods. Food that is high in saturated fat, such as fatty meat. Desserts and other sweets, sugary drinks, and other foods with added sugar. Full-fat dairy products. Do not salt foods before eating. Do not eat more than 4 egg yolks a week. Try to eat at least 2 vegetarian meals a week. Eat more home-cooked food and less restaurant, buffet, and fast food. Lifestyle When eating at a restaurant, ask if your food can be made with  less salt or no salt. If you drink alcohol : Limit how much you have to: 0-1 drink a day if you are female. 0-2 drinks a day if you are female. Know how much alcohol  is in your drink. In the [place] one drink is one 12 oz bottle of beer (355 mL), one 5 oz glass of wine (148 mL), or one 1 oz glass of hard liquor (44 mL). General information Avoid eating more than 2,300 mg of salt a day. If you have hypertension, you may need to reduce your sodium intake to 1,500 mg a day. Work with your provider to stay at a healthy body weight or lose weight. Ask what the best weight range is for you. On most days of the week, get at least 30 minutes of exercise that causes your heart to beat faster. This may include walking, swimming, or biking. Work with your provider or dietitian to adjust your eating plan to meet your specific calorie needs. What foods should I eat? Fruits All fresh, dried, or frozen fruit. Canned fruits that are in their natural juice and do not have sugar added to them. Vegetables Fresh or frozen vegetables that are raw, steamed, roasted, or grilled. Low-sodium or reduced-sodium tomato and vegetable juice. Low-sodium or reduced-sodium tomato sauce and tomato paste. Low-sodium or reduced-sodium canned vegetables. Grains Whole-grain or whole-wheat bread. Whole-grain or whole-wheat pasta. Brown rice. [place]neil Madeira. Bulgur. Whole-grain and low-sodium cereals. Pita bread. Low-fat, low-sodium crackers. Whole-wheat flour tortillas. Meats and other proteins Skinless chicken or turkey. Ground chicken or turkey. Pork with fat trimmed off. Fish and seafood. Egg whites. Dried beans, peas, or lentils. Unsalted nuts, nut butters, and seeds. Unsalted canned beans. Lean cuts of beef with fat trimmed off. Low-sodium, lean precooked or cured meat, such as sausages or meat loaves. Dairy Low-fat (1%) or fat-free (skim) milk. Reduced-fat, low-fat, or fat-free cheeses. Nonfat, low-sodium ricotta or cottage  cheese. Low-fat or nonfat yogurt. Low-fat, low-sodium cheese. Fats and oils Soft margarine without trans fats. Vegetable oil. Reduced-fat, low-fat, or light mayonnaise and salad dressings (reduced-sodium). Canola, safflower, olive, avocado, soybean, and sunflower oils. Avocado. Seasonings and condiments Herbs. Spices. Seasoning mixes without salt. Other foods Unsalted popcorn and pretzels. Fat-free sweets. The items listed above may not be all the foods and drinks you can have. Talk to a dietitian to learn more. What foods should I avoid? Fruits Canned fruit in a light or heavy syrup. Fried fruit. Fruit in cream or butter sauce.  Vegetables Creamed or fried vegetables. Vegetables in a cheese sauce. Regular canned vegetables that are not marked as low-sodium or reduced-sodium. Regular canned tomato sauce and paste that are not marked as low-sodium or reduced-sodium. Regular tomato and vegetable juices that are not marked as low-sodium or reduced-sodium. Dene. Olives. Grains Baked goods made with fat, such as croissants, muffins, or some breads. Dry pasta or rice meal packs. Meats and other proteins Fatty cuts of meat. Ribs. Fried meat. [place]. Bologna, salami, and other precooked or cured meats, such as sausages or meat loaves, that are not lean and low in sodium. Fat from the back of a pig (fatback). Bratwurst. Salted nuts and seeds. Canned beans with added salt. Canned or smoked fish. Whole eggs or egg yolks. Chicken or turkey with skin. Dairy Whole or 2% milk, cream, and half-and-half. Whole or full-fat cream cheese. Whole-fat or sweetened yogurt. Full-fat cheese. Nondairy creamers. Whipped toppings. Processed cheese and cheese spreads. Fats and oils Butter. Stick margarine. Lard. Shortening. Ghee. Bacon fat. Tropical oils, such as coconut, palm kernel, or palm oil. Seasonings and condiments Onion salt, garlic salt, seasoned salt, table salt, and sea salt. Worcestershire sauce. Tartar sauce.  Barbecue sauce. Teriyaki sauce. Soy sauce, including reduced-sodium soy sauce. Steak sauce. Canned and packaged gravies. Fish sauce. Oyster sauce. Cocktail sauce. Store-bought horseradish. Ketchup. Mustard. Meat flavorings and tenderizers. Bouillon cubes. Hot sauces. Pre-made or packaged marinades. Pre-made or packaged taco seasonings. Relishes. Regular salad dressings. Other foods Salted popcorn and pretzels. The items listed above may not be all the foods and drinks you should avoid. Talk to a dietitian to learn more. Where to find more information National Heart, Lung, and Blood Institute (NHLBI): buffalodrycleaner.gl American Heart Association (AHA): heart.org Academy of Nutrition and Dietetics: eatright.org National Kidney Foundation (NKF): kidney.org This information is not intended to replace advice given to you by your health care provider. Make sure you discuss any questions you have with your health care provider. Document Revised: 07/23/2022 Document Reviewed: 07/23/2022 Elsevier Patient Education  2024 [employer] Inc.  ASSESSMENT AND PLAN :   78 y.o. 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  I would like for her to get 14 days ZIO patch.  Aspirin  was exchanged to Plavix, she remains on Eliquis too, indefinitely . No change there.    She reports some delay in word finding as a sign of memory loss- I rec. audiology retesting , she seems to hear less well than last year, tinnitus has become bilaterally present. LOUD.   She is straining hard to hear , reads lips.   Neuro rehab has been arranged and should work on gait and posture.   3) RV prn with Dr Verneita NP in 6 months.   PS : OSA :  I recommended to renew the mandibular device  for her sleep apnea we checked in 06-2023, it worked but teeth have shifted.  I recommended Dr Nena Riding, DDS.

## 2024-05-17 NOTE — Progress Notes (Signed)
 Provider:  Dedra Gores, MD  Primary Care Physician:  Janey Santos, MD [address] [place] [address]     Referring Provider: Janey Santos, Md [address] [place],  [place] [address]          Chief Complaint according to patient   Patient presents with:                HISTORY OF PRESENT ILLNESS:  Anne Hogan is a 78 y.o. female patient who is here for a new problem,  referred by ED and Dr Janey , tis time for TIA.   05/17/2024 for  TIA  on 05-09-2024.who presents after an episode of speech difficulty. .    Chief concern according to patient :  It happened while I was on the phone ,  my speech became garbled, (dysarthria ), lasting about 10 minutes,  Her conversation partner on the other side of the phone noticed too and asked her to call (!!, a neighbor took her to the ED in a private car).  At the ED at [facility] she had labs, and a MRI , CT scan and EEG , was well hydrated, normal INR. Normal CT and normal MRI. She has sometimes palpitations, had once atrial fib after her major cancer surgery.  CTA head and neck significant for no LVO. LDL of 132.  Hemoglobin A1C of 5.3%. Transthoracic Echocardiogram significant for LVEF of 55 to 60% and no atrial level shunt.  Neurology recommendations for Eliquis 2 g twice daily on discharge and to discontinue aspirin .   Patient was started on Crestor.  PT/OT recommendations for outpatient therapy.    Interval history :  Developed leg cramps, and still has tinnitus but in both ears. Progressive hearing loss, wears hearing aids.   Recently diagnosed with COVID-19 on Friday.  Currently on paxlovid.   No chest pain or shortness of breath.   Previous medical history reviewed : seen in ED 03-30-2024 flank pain and back pain, dx as a strain,  Follows up with oncology.  Last note was made in June 2025.  History of non-small lung cancer adenocarcinoma.  Status post left lower lobectomy.  Past medical history  significant for asthma, OSA, chronic HFpEF, CKD 3A, stage Ia lung cancer and additional pulmonary nodules status post left lower lobectomy, and history of brief postoperative atrial fibrillation in 2021   Patient was in her usual state of health at roughly 2:30 PM when she was having difficulty speaking for 5 to 10 minutes.  She felt as though she could not pronounce words appropriately and may have had some word finding difficulty as well.  She denies any associated headache, focal numbness, or focal weakness.   [facility] ED Course: Upon arrival to the ED, patient is found to be afebrile and saturating well on room air with normal HR and stable BP.  Labs are most notable for creatinine 1.09, normal WBC, normal hemoglobin, and undetectable ethanol.  There were no acute findings on head CT.   Neurology was consulted by the ED physician and the patient was transferred to [facility] where she had CTA of the head and neck with no large vessel occlusion or hemodynamically significant stenosis, and then brain MRI with no acute intracranial abnormality. She passed the stroke swallow screen.      Fam Hx : see previous note  Social HX; see previous note       Review of Systems:  She presented with transient 10 to 15-minute episode of expressive language difficulties.  She reports prior history of palpitations.  She did have transient perioperative A-fib following mitral valve repair. Patient remains hemodynamically stable and afebrile overnight.  She reports that her speech has returned to normal and symptoms have completely resolved. Out of a complete 14 system review, the patient complains of only the following symptoms, and all other reviewed systems are negative.:   ADD/ ADHD   Increasingly fatigued but sleep is interrupted by her young dog, and she sleep in a recliner with the dog in te lab.   How likely are you to doze in the following situations: 0 = not likely, 1 = slight  chance, 2 = moderate chance, 3 = high chance  Sitting and Reading? Watching Television? Sitting inactive in a public place (theater or meeting)? As a passenger in a car for an hour without a break? Lying down in the afternoon when circumstances permit? Sitting and talking to someone? Sitting quietly after lunch without alcohol ? In a car, while stopped for a few minutes in traffic?   Total =12  / 24 points.        Social History   Socioeconomic History   Marital status: Widowed    Spouse name: Not on file   Number of children: Not on file   Years of education: Not on file   Highest education level: Master's degree (e.g., MA, MS, MEng, MEd, MSW, MBA)  Occupational History   Occupation: Retired  Tobacco Use   Smoking status: Former    Current packs/day: 0.00    Average packs/day: 1.5 packs/day for 20.0 years (30.0 ttl pk-yrs)    Types: Cigarettes    Start date: 11/30/1966    Quit date: 11/30/1986    Years since quitting: 37.4   Smokeless tobacco: Never  Vaping Use   Vaping status: Never Used  Substance and Sexual Activity   Alcohol  use: Yes    Comment: RARE   Drug use: No   Sexual activity: Not Currently  Other Topics Concern   Not on file  Social History Narrative   Pt lives alone    Retired       2 -4 cups of caffeine daily ( tea and coffee)    Social Drivers of Corporate Investment Banker Strain: Not on file  Food Insecurity: No Food Insecurity (05/09/2024)   Hunger Vital Sign    Worried About Running Out of Food in the Last Year: Never true    Ran Out of Food in the Last Year: Never true  Transportation Needs: No Transportation Needs (05/09/2024)   PRAPARE - Administrator, Civil Service (Medical): No    Lack of Transportation (Non-Medical): No  Physical Activity: Not on file  Stress: Not on file  Social Connections: Moderately Integrated (05/09/2024)   Social Connection and Isolation Panel    Frequency of Communication with Friends and  Family: More than three times a week    Frequency of Social Gatherings with Friends and Family: More than three times a week    Attends Religious Services: More than 4 times per year    Active Member of [employer] or Organizations: Yes    Attends Banker Meetings: More than 4 times per year    Marital Status: Widowed    Family History  Problem Relation Age of Onset   Tremor Mother    Osteoporosis Mother    Sarcoidosis Mother    Hypertension  Mother    Congestive Heart Failure Father    Heart attack Father    Diabetes Mellitus II Father    Diabetes Mellitus II Brother    Sleep apnea Neg Hx    Seizures Neg Hx    Stroke Neg Hx    Migraines Neg Hx     Past Medical History:  Diagnosis Date   ADD (attention deficit disorder)    Arthritis    right knee (04/19/2013)   Asthma 04/03/2019   CHF (congestive heart failure) (HCC)    Chronic diastolic congestive heart failure (HCC)    Claustrophobia    Depression    Dyspnea    Dysrhythmia    Fracture of right tibial plateau 05/2016   GERD (gastroesophageal reflux disease)    Heart murmur    Incidental pulmonary nodule, greater than or equal to 8mm 06/06/2019   Needs f/u CT in 3 months   Migraine    bad when I was in 5th or 6th grade; I can usually ward them off by resting my head now (04/19/2013)   MVP (mitral valve prolapse)    S/P minimally-invasive mitral valve repair 08/03/2019   Complex valvuloplasty including artificial Gore-tex neochords x12 and Sorin Memo 4D ring annuloplasty, size 38 mm   S/P patent foramen ovale closure 08/03/2019   Sleep apnea    has not used mouth guard in 5 years no cpap used    Squamous carcinoma ?1999   upper left groin (04/19/2013)    Past Surgical History:  Procedure Laterality Date   CARDIAC CATHETERIZATION  05/26/2019   CHOLECYSTECTOMY  04/18/2013   CHOLECYSTECTOMY N/A 04/18/2013   Procedure: LAPAROSCOPIC CHOLECYSTECTOMY WITH INTRAOPERATIVE CHOLANGIOGRAM;  Surgeon: Lynda Leos,  MD;  Location: [facility] OR;  Service: General;  Laterality: N/A;   COLONOSCOPY W/ POLYPECTOMY     EYE SURGERY Bilateral FALL  2016   IOC FOR CATARACTS   FRACTURE SURGERY     INTERCOSTAL NERVE BLOCK Left 11/23/2019   Procedure: Intercostal Nerve Block;  Surgeon: Kerrin Elspeth BROCKS, MD;  Location: [facility] OR;  Service: Thoracic;  Laterality: Left;   JOINT REPLACEMENT     KNEE ARTHROSCOPY Right 12/2007   meniscus repair (04/19/2013)   LYMPH NODE DISSECTION Left 11/23/2019   Procedure: Lymph Node Dissection;  Surgeon: Kerrin Elspeth BROCKS, MD;  Location: [facility] OR;  Service: Thoracic;  Laterality: Left;   MITRAL VALVE REPAIR Right 08/03/2019   Procedure: MINIMALLY INVASIVE MITRAL VALVE REPAIR (MVR) using Memo 4D 38 MM Mitral Valve.;  Surgeon: Dusty Sudie DEL, MD;  Location: [facility] OR;  Service: Open Heart Surgery;  Laterality: Right;   REPAIR OF PATENT FORAMEN OVALE N/A 08/03/2019   Procedure: CLOSURE OF PATENT FORAMEN OVALE;  Surgeon: Dusty Sudie DEL, MD;  Location: [facility] OR;  Service: Open Heart Surgery;  Laterality: N/A;   RETINAL TEAR REPAIR CRYOTHERAPY Left ~ 1997   RIGHT/LEFT HEART CATH AND CORONARY ANGIOGRAPHY N/A 05/26/2019   Procedure: RIGHT/LEFT HEART CATH AND CORONARY ANGIOGRAPHY;  Surgeon: Dann Candyce RAMAN, MD;  Location: [facility] INVASIVE CV LAB;  Service: Cardiovascular;  Laterality: N/A;   TEE WITHOUT CARDIOVERSION N/A 05/26/2019   Procedure: TRANSESOPHAGEAL ECHOCARDIOGRAM (TEE);  Surgeon: Delford Maude BROCKS, MD;  Location: [facility] ENDOSCOPY;  Service: Cardiovascular;  Laterality: N/A;   TEE WITHOUT CARDIOVERSION N/A 08/03/2019   Procedure: TRANSESOPHAGEAL ECHOCARDIOGRAM (TEE);  Surgeon: Dusty Sudie DEL, MD;  Location: [facility] OR;  Service: Open Heart Surgery;  Laterality: N/A;   TONSILLECTOMY  ~ 1952   TOTAL KNEE ARTHROPLASTY Right 09/21/2016  Procedure: RIGHT TOTAL KNEE ARTHROPLASTY;  Surgeon: Dempsey Moan, MD;  Location: [facility] ORS;  Service: Orthopedics;  Laterality: Right;  requests with abductor block    VARICOSE VEIN SURGERY Right 2000's   VASCULAR SURGERY     WRIST SURGERY Right 1975   had a knot taken off (04/19/2013)     Current Outpatient Medications on File Prior to Visit  Medication Sig Dispense Refill   lidocaine  (LIDODERM ) 5 % Place 1 patch onto the skin daily. Remove & Discard patch within 12 hours or as directed by MD (Patient taking differently: Place 1 patch onto the skin daily as needed. Remove & Discard patch within 12 hours or as directed by MD) 30 patch 0   acetaminophen  (TYLENOL ) 500 MG tablet Take 500-1,000 mg by mouth every 6 (six) hours as needed for moderate pain (pain).      Alum Hydroxide-Mag Carbonate (GAVISCON PO) Take 1 tablet by mouth 4 (four) times daily as needed (acid reflux).     apixaban (ELIQUIS) 5 MG TABS tablet Take 1 tablet (5 mg total) by mouth 2 (two) times daily. 180 tablet 0   B Complex-C (SUPER B-COMPLEX + VITAMIN C PO) Take 1 tablet by mouth daily at 6 (six) AM.     Berberine Chloride (BERBERINE HCI PO) Take 1 tablet by mouth daily at 6 (six) AM. (Patient not taking: Reported on 05/17/2024)     BIOTIN PO Take 1 tablet by mouth daily at 6 (six) AM.     BREO ELLIPTA  100-25 MCG/INH AEPB USE 1 PUFF DAILY AS DIRECTED 60 each 0   doxycycline  (VIBRAMYCIN ) 100 MG capsule Take 1 capsule (100 mg total) one hour prior to any dental procedure 2 capsule 3   Ginkgo Biloba 40 MG TABS Take 1 tablet by mouth daily.     MAGNESIUM  PO Take 1 tablet by mouth daily.     montelukast  (SINGULAIR ) 10 MG tablet Take 10 mg by mouth at bedtime.     Multiple Vitamins-Minerals (CENTRUM SILVER 50+WOMEN PO) Take 1 tablet by mouth daily.     pantoprazole  (PROTONIX ) 40 MG tablet Take 40 mg by mouth at bedtime. (Patient not taking: Reported on 05/17/2024)     psyllium (REGULOID) 0.52 g capsule Take 3 capsules by mouth daily.     rosuvastatin (CRESTOR) 20 MG tablet Take 1 tablet (20 mg total) by mouth daily. 90 tablet 0   VITAMIN D PO Take 1 tablet by mouth See admin instructions.  Takes 1 tablet on Sunday and 1 tablet on Thursday     No current facility-administered medications on file prior to visit.    Allergies  Allergen Reactions   Penicillins Rash    Has patient had a PCN reaction causing immediate rash, facial/tongue/throat swelling, SOB or lightheadedness with hypotension: No Has patient had a PCN reaction causing severe rash involving mucus membranes or skin necrosis: no Has patient had a PCN reaction that required hospitalization: No Has patient had a PCN reaction occurring within the last 10 years: no If all of the above answers are NO, then may proceed with Cephalosporin use.     Codeine Other (See Comments)    Possible sensitivity, patient not sure   Morphine  And Codeine     strange sensation across midsection and chest     DIAGNOSTIC DATA (LABS, IMAGING, TESTING) - I reviewed patient records, labs, notes, testing and imaging myself where available.  Lab Results  Component Value Date   WBC 5.6 05/10/2024   HGB 13.3  05/10/2024   HCT 39.4 05/10/2024   MCV 89.3 05/10/2024   PLT 286 05/10/2024      Component Value Date/Time   NA 139 05/10/2024 0453   NA 140 05/12/2019 1243   K 3.7 05/10/2024 0453   CL 109 05/10/2024 0453   CO2 22 05/10/2024 0453   GLUCOSE 92 05/10/2024 0453   BUN 12 05/10/2024 0453   BUN 14 05/12/2019 1243   CREATININE 0.98 05/10/2024 0453   CREATININE 1.15 (H) 12/23/2023 1245   CREATININE 1.05 (H) 07/19/2019 1206   CALCIUM 8.7 (L) 05/10/2024 0453   PROT 7.2 05/09/2024 1508   ALBUMIN  4.2 05/09/2024 1508   AST 22 05/09/2024 1508   AST 18 12/23/2023 1245   ALT 23 05/09/2024 1508   ALT 16 12/23/2023 1245   ALKPHOS 81 05/09/2024 1508   BILITOT 0.7 05/09/2024 1508   BILITOT 0.9 12/23/2023 1245   GFRNONAA 59 (L) 05/10/2024 0453   GFRNONAA 49 (L) 12/23/2023 1245   GFRAA 48 (L) 12/25/2019 1310   Lab Results  Component Value Date   CHOL 210 (H) 05/10/2024   HDL 66 05/10/2024   LDLCALC 132 (H) 05/10/2024   TRIG  60 05/10/2024   CHOLHDL 3.2 05/10/2024   Lab Results  Component Value Date   HGBA1C 5.3 05/10/2024   No results found for: VITAMINB12 Lab Results  Component Value Date   TSH 2.265 08/05/2019    PHYSICAL EXAM:  Vitals:   05/17/24 1041  BP: 94/72  Pulse: 77  SpO2: 99%   No data found. Body mass index is 29.92 kg/m.   Wt Readings from Last 3 Encounters:  05/17/24 163 lb 9.6 oz (74.2 kg)  05/09/24 164 lb 3.9 oz (74.5 kg)  12/28/23 175 lb 1.6 oz (79.4 kg)     Ht Readings from Last 3 Encounters:  05/17/24 5' 2 (1.575 m)  05/09/24 5' 2 (1.575 m)  06/28/23 5' 2 (1.575 m)      General: The patient is awake, alert and appears not in acute distress and groomed. Head: Normocephalic, atraumatic.  Neck is supple. Mental Status: AA&Ox3, patient is able to give clear and coherent history Speech/Language: speech is without dysarthria or aphasia.  Naming, repetition, fluency, and comprehension intact.   Cranial Nerves:  II: PERRL. Visual fields full.  III, IV, VI: EOMI. Eyelids elevate symmetrically.  V: Sensation is intact to light touch and symmetrical to face.  VII: Face is symmetrical resting and smiling VIII: hearing impaired to voice/ tinnitus . IX, X:  Phonation is normal.  KP:Dynloizm shrug full strength  XII: tongue is midline without fasciculations. Motor: 5/5 strength to all muscle groups tested.  Tone: is normal and bulk is normal Sensation- Intact to light touch / vibration bilaterally.  Coordination: FTN intact bilaterally, she played piano yesterday without problems.  Gait- intact, balance sometimes  affected by her flat foot on the right.  =    ASSESSMENT AND PLAN :   78 y.o. 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  I would like for her to get 14 days ZIO patch.  Aspirin  was exchanged to Plavix, she remains on Eliquis too, indefinitely . No change there.    She reports  some delay in word finding as a sign of memory loss- I rec. audiology retesting , she seems to hear less well than last year, tinnitus has become bilaterally present. LOUD.   She  is straining hard to hear , reads lips.   Neuro rehab has been arranged and should work on gait and posture.   3) RV prn with Dr Verneita NP in 6 months.    PS : OSA :  I recommended to renew the mandibular device  for her sleep apnea we checked in 06-2023, it worked but teeth have shifted.  I recommended Dr Nena Riding, DDS.      I would like to thank Avva, Ravisankar, MD and Avva, Ravisankar, Md [address] [place],  [place] [address] for allowing me to meet with this pleasant patient.    The patient's condition requires frequent monitoring and adjustments in the treatment plan, reflecting the ongoing complexity of care.  This provider is the continuing focal point for all needed services for this condition.  After spending a total time of  45  minutes face to face and time for  history taking, physical and neurologic examination, review of laboratory studies,  personal review of imaging studies, reports and results of other testing and review of referral information / records as far as provided in visit,   Electronically signed by: Dedra Gores, MD 05/17/2024 10:54 AM  [facility] and [facility] Board certified by The [employer] Sleep Medicine and Diplomate of the [employer] of [employer] Medicine. Board certified In Neurology through the ABPN, Fellow of the [employer] of Neu[employer].

## 2024-05-17 NOTE — Telephone Encounter (Signed)
Referral for dentistry fax to [facility]. Phone: [phone], Fax: [phone]

## 2024-05-18 ENCOUNTER — Telehealth: Payer: Self-pay | Admitting: Neurology

## 2024-05-18 NOTE — Telephone Encounter (Signed)
 I called pt and let her know per Dr. Chalice ok to take plavix and eliquis together.  With food ok.  No grapefruit juices/fruit.  Plavix for 30 days only.  Needs appt with Dr. Rosemarie.

## 2024-05-18 NOTE — Telephone Encounter (Signed)
 YES- 30 days on both, then RV with Dr Rosemarie by 07-05-2024.  Can stop Plavix after 30 days.

## 2024-05-18 NOTE — Telephone Encounter (Signed)
 Patient asking what time of the day to take clopidogrel (PLAVIX) 75 MG tablet and if can take with  apixaban (ELIQUIS) 5 MG TABS tablet. Would like a call back.

## 2024-05-19 DIAGNOSIS — Z96651 Presence of right artificial knee joint: Secondary | ICD-10-CM | POA: Diagnosis not present

## 2024-05-19 DIAGNOSIS — M25562 Pain in left knee: Secondary | ICD-10-CM | POA: Diagnosis not present

## 2024-05-25 ENCOUNTER — Telehealth: Payer: Self-pay | Admitting: Cardiology

## 2024-05-25 NOTE — Telephone Encounter (Signed)
 Patient c/o Palpitations: STAT if patient c/o lightheadedness, shortness of breath, or chest pain  How long have you had palpitations/irregular HR/ Afib? Are you having the symptoms now?  Patient felt palpitations this morning--it woke her up around 6:00 AM. Lasted about 1 hour. No symptoms currently.  Are you currently experiencing lightheadedness, SOB or CP? No   Do you have a history of afib (atrial fibrillation) or irregular heart rhythm?  Hx afib  Have you checked your BP or HR? (document readings if available):  No   Are you experiencing any other symptoms?  No symptoms but patient mentions bruises on her arm and doesn't know where they came from

## 2024-05-25 NOTE — Telephone Encounter (Signed)
 Left message on patient voicemail to return call.

## 2024-05-26 NOTE — Telephone Encounter (Signed)
 Spoke with pt, she is on eliquis and plavix and wonders if that is okay. Explained the difference between eliquis for the a fib and plavix for the TIA. She voiced understanding and will pay closer attention to what she is doing.

## 2024-05-26 NOTE — Therapy (Signed)
 OUTPATIENT PHYSICAL THERAPY NEURO EVALUATION   Patient Name: Anne Hogan MRN: [id number] DOB:Nov 30, 1945, 78 y.o., female Today's Date: 05/29/2024   PCP: Janey Santos, MD  REFERRING PROVIDER:  Jama Manuelita RAMAN, NP  END OF SESSION:  PT End of Session - 05/29/24 1144     Visit Number 1    Number of Visits 7    Date for Recertification  07/17/24    Authorization Type [employer]    PT Start Time 1100    PT Stop Time 1141    PT Time Calculation (min) 41 min    Equipment Utilized During Treatment Gait belt    Activity Tolerance Patient tolerated treatment well    Behavior During Therapy WFL for tasks assessed/performed          Past Medical History:  Diagnosis Date   ADD (attention deficit disorder)    Arthritis    right knee (04/19/2013)   Asthma 04/03/2019   CHF (congestive heart failure) (HCC)    Chronic diastolic congestive heart failure (HCC)    Claustrophobia    Depression    Dyspnea    Dysrhythmia    Fracture of right tibial plateau 05/2016   GERD (gastroesophageal reflux disease)    Heart murmur    Incidental pulmonary nodule, greater than or equal to 8mm 06/06/2019   Needs f/u CT in 3 months   Migraine    bad when I was in 5th or 6th grade; I can usually ward them off by resting my head now (04/19/2013)   MVP (mitral valve prolapse)    S/P minimally-invasive mitral valve repair 08/03/2019   Complex valvuloplasty including artificial Gore-tex neochords x12 and Sorin Memo 4D ring annuloplasty, size 38 mm   S/P patent foramen ovale closure 08/03/2019   Sleep apnea    has not used mouth guard in 5 years no cpap used    Squamous carcinoma ?1999   upper left groin (04/19/2013)   Past Surgical History:  Procedure Laterality Date   CARDIAC CATHETERIZATION  05/26/2019   CHOLECYSTECTOMY  04/18/2013   CHOLECYSTECTOMY N/A 04/18/2013   Procedure: LAPAROSCOPIC CHOLECYSTECTOMY WITH INTRAOPERATIVE CHOLANGIOGRAM;  Surgeon: Lynda Leos, MD;  Location: [facility] OR;   Service: General;  Laterality: N/A;   COLONOSCOPY W/ POLYPECTOMY     EYE SURGERY Bilateral FALL  2016   IOC FOR CATARACTS   FRACTURE SURGERY     INTERCOSTAL NERVE BLOCK Left 11/23/2019   Procedure: Intercostal Nerve Block;  Surgeon: Kerrin Elspeth BROCKS, MD;  Location: [facility] OR;  Service: Thoracic;  Laterality: Left;   JOINT REPLACEMENT     KNEE ARTHROSCOPY Right 12/2007   meniscus repair (04/19/2013)   LYMPH NODE DISSECTION Left 11/23/2019   Procedure: Lymph Node Dissection;  Surgeon: Kerrin Elspeth BROCKS, MD;  Location: [facility] OR;  Service: Thoracic;  Laterality: Left;   MITRAL VALVE REPAIR Right 08/03/2019   Procedure: MINIMALLY INVASIVE MITRAL VALVE REPAIR (MVR) using Memo 4D 38 MM Mitral Valve.;  Surgeon: Dusty Sudie DEL, MD;  Location: [facility] OR;  Service: Open Heart Surgery;  Laterality: Right;   REPAIR OF PATENT FORAMEN OVALE N/A 08/03/2019   Procedure: CLOSURE OF PATENT FORAMEN OVALE;  Surgeon: Dusty Sudie DEL, MD;  Location: [facility] OR;  Service: Open Heart Surgery;  Laterality: N/A;   RETINAL TEAR REPAIR CRYOTHERAPY Left ~ 1997   RIGHT/LEFT HEART CATH AND CORONARY ANGIOGRAPHY N/A 05/26/2019   Procedure: RIGHT/LEFT HEART CATH AND CORONARY ANGIOGRAPHY;  Surgeon: Dann Candyce RAMAN, MD;  Location: [facility] INVASIVE CV LAB;  Service: Cardiovascular;  Laterality: N/A;   TEE WITHOUT CARDIOVERSION N/A 05/26/2019   Procedure: TRANSESOPHAGEAL ECHOCARDIOGRAM (TEE);  Surgeon: Delford Maude BROCKS, MD;  Location: [facility] ENDOSCOPY;  Service: Cardiovascular;  Laterality: N/A;   TEE WITHOUT CARDIOVERSION N/A 08/03/2019   Procedure: TRANSESOPHAGEAL ECHOCARDIOGRAM (TEE);  Surgeon: Dusty Sudie DEL, MD;  Location: [facility] OR;  Service: Open Heart Surgery;  Laterality: N/A;   TONSILLECTOMY  ~ 1952   TOTAL KNEE ARTHROPLASTY Right 09/21/2016   Procedure: RIGHT TOTAL KNEE ARTHROPLASTY;  Surgeon: Dempsey Moan, MD;  Location: [facility] ORS;  Service: Orthopedics;  Laterality: Right;  requests with abductor block   VARICOSE VEIN SURGERY Right  2000's   VASCULAR SURGERY     WRIST SURGERY Right 1975   had a knot taken off (04/19/2013)   Patient Active Problem List   Diagnosis Date Noted   TIA due to embolism (HCC) 05/17/2024   Mild sleep apnea 05/17/2024   TIA (transient ischemic attack) 05/09/2024   CKD stage 3a, GFR 45-59 ml/min (HCC) 05/09/2024   Morning headache 06/28/2023   Pulsatile tinnitus, left ear 06/28/2023   Snoring 06/28/2023   History of sleep apnea 06/28/2023   Adenocarcinoma, lung, left (HCC) 12/25/2019   S/P lobectomy of lung 11/23/2019   S/P minimally-invasive mitral valve repair 08/03/2019   S/P patent foramen ovale closure 08/03/2019   Chronic diastolic congestive heart failure (HCC)    Tricuspid regurgitation    Incidental pulmonary nodule, greater than or equal to 8mm 06/06/2019   Asthma 04/03/2019   OA (osteoarthritis) of knee 09/21/2016   Mitral valve prolapse 08/25/2016   Severe mitral regurgitation 08/25/2016    ONSET DATE: 05/09/24  REFERRING DIAG: G45.9 (ICD-10-CM) - Transient cerebral ischemic attack, unspecified  THERAPY DIAG:  Unsteadiness on feet  Rationale for Evaluation and Treatment: Rehabilitation  SUBJECTIVE:                                                                                                                                                                                             SUBJECTIVE STATEMENT: Patient reports doing fine. Reports no symptoms besides change in speech for 10 minutes during the TIA event and has felt fine ever since. Reports that in March she was diagnosed with collapsed arch on the R. Reports that she has been seen at [facility] for PT for her foot and stopped in September. Reports that her balance is not good. Used to walk laps inside the house and was told by her doctor not to walk her dog. Pt admits that she is not active and spends too much time in her recliner with her dogs.  Pt accompanied by: self  PERTINENT HISTORY: ADD, asthma, CHF,  depression, hx R tibial plateau fx, migraine, R TKA  PAIN:  Are you having pain? Yes: NPRS scale: 4/10 Pain location: R medial ankle/foot Pain description: sore Aggravating factors: walking Relieving factors: has not tried anything  PRECAUTIONS: Fall  RED FLAGS: None   WEIGHT BEARING RESTRICTIONS: No  FALLS: Has patient fallen in last 6 months? No  LIVING ENVIRONMENT: Lives with: lives alone Lives in: House/apartment Stairs: bedroom on 1st level; 2-4 steps to enter without railing Has following equipment at home: Single point cane and Environmental Consultant - 2 wheeled  PLOF: Independent, Vocation/Vocational requirements: retired, and Leisure: painting, hanging out with her dogs  PATIENT GOALS: pt reports no goals  OBJECTIVE:  Note: Objective measures were completed at Evaluation unless otherwise noted.  DIAGNOSTIC FINDINGS: 05/09/24 brain MRI: No acute intracranial abnormality. 2. Multifocal T2 hyperintense white matter signal, most consistent with chronic small vessel disease.  COGNITION: Overall cognitive status: Within functional limits for tasks assessed   SENSATION: Pt reports chronic N/ in R great toe  COORDINATION: Alternating pronation/supination: WNL B Alternating toe tap: WNL B Finger to nose: WNL B    MUSCLE TONE: WNL B LEs   POSTURE: elevated shoulders, FHP  LOWER EXTREMITY ROM:     Active  Right Eval Left Eval  Hip flexion    Hip extension    Hip abduction    Hip adduction    Hip internal rotation    Hip external rotation    Knee flexion    Knee extension    Ankle dorsiflexion 6 6  Ankle plantarflexion    Ankle inversion    Ankle eversion     (Blank rows = not tested)  LOWER EXTREMITY MMT:    MMT Right Eval Left Eval  Hip flexion 4+ 4+  Hip extension    Hip abduction 4+ 4+  Hip adduction 4+ 4+  Hip internal rotation    Hip external rotation    Knee flexion 4 4  Knee extension 5 5  Ankle dorsiflexion 4+ 4+  Ankle plantarflexion 4+ 4+   Ankle inversion    Ankle eversion    (Blank rows = not tested)  GAIT: Findings: Assistive device utilized:None, Level of assistance: Modified independence and SBA, and Comments: Antalgia on R LE, imbalance with turns   FUNCTIONAL TESTS:   [facility] PT Assessment - 05/29/24 0001       Functional Gait  Assessment   Gait assessed  Yes    Gait Level Surface Walks 20 ft in less than 7 sec but greater than 5.5 sec, uses assistive device, slower speed, mild gait deviations, or deviates 6-10 in outside of the 12 in walkway width.    Change in Gait Speed Able to smoothly change walking speed without loss of balance or gait deviation. Deviate no more than 6 in outside of the 12 in walkway width.    Gait with Horizontal Head Turns Performs head turns with moderate changes in gait velocity, slows down, deviates 10-15 in outside 12 in walkway width but recovers, can continue to walk.    Gait with Vertical Head Turns Performs head turns with no change in gait. Deviates no more than 6 in outside 12 in walkway width.    Gait and Pivot Turn Turns slowly, requires verbal cueing, or requires several small steps to catch balance following turn and stop    Step Over Obstacle Is able to step over one shoe box (4.5 in  total height) without changing gait speed. No evidence of imbalance.    Gait with Narrow Base of Support Ambulates less than 4 steps heel to toe or cannot perform without assistance.    Gait with Eyes Closed Walks 20 ft, uses assistive device, slower speed, mild gait deviations, deviates 6-10 in outside 12 in walkway width. Ambulates 20 ft in less than 9 sec but greater than 7 sec.    Ambulating Backwards Walks 20 ft, no assistive devices, good speed, no evidence for imbalance, normal gait    Steps Alternating feet, must use rail.    Total Score 19           M-CTSIB  Condition 1: Firm Surface, EO 30 Sec, Mild Sway  Condition 2: Firm Surface, EC 30 Sec, Mild and Moderate Sway  Condition 3: Foam  Surface, EO 30 Sec, Mild and Moderate Sway  Condition 4: Foam Surface, EC 30 Sec, Moderate Sway                                                                                                                                 TREATMENT DATE: 05/29/24    PATIENT EDUCATION: Education details: prognosis, POC, edu on exam findings and how they relate to functional impairments  Person educated: Patient Education method: Explanation Education comprehension: verbalized understanding  HOME EXERCISE PROGRAM: Not yet initiated   GOALS: Goals reviewed with patient? Yes  SHORT TERM GOALS: Target date: 06/19/2024  Patient to be independent with initial HEP. Baseline: HEP initiated Goal status: INITIAL    LONG TERM GOALS: Target date: 07/17/2024  Patient to be independent with advanced HEP. Baseline: Not yet initiated  Goal status: INITIAL  Patient to report plans to participate in home or community exercise regimen to maintain fitness.  Baseline: not participating Goal status: INITIAL  Patient to score at least 23/30 on FGA in order to decrease risk of falls.  Baseline: 19 Goal status: INITIAL  Patient to demonstrate mild sway with MCTSIB condition 4 to improve stability on uneven surfaces.  Baseline: moderate sway Goal status: INITIAL  Patient to demonstrate good stability with turns and head turns. Baseline: consistent imbalance with turns and L head turns while walking  Goal status: INITIAL  ASSESSMENT:  CLINICAL IMPRESSION:  Patient is a 78 y/o F presenting to OPPT s/p hospitalization 05/09/24- 05/10/24 for suspected TIA. She reports chronic R foot pain, imbalance, and decreased activity level. Patient today presenting with FHP, B HS weakness, gait deviations, imbalance. Patient's score on FGA indicates an increased risk of falls. Would benefit from skilled PT services 1 x/week for 6 weeks to address aforementioned impairments in order to optimize level of function.     OBJECTIVE IMPAIRMENTS: Abnormal gait, decreased activity tolerance, decreased balance, difficulty walking, decreased ROM, decreased strength, impaired flexibility, postural dysfunction, and pain.   ACTIVITY LIMITATIONS: carrying, lifting, bending, sitting, standing, squatting, sleeping, stairs, transfers, bathing, toileting, dressing, reach over head, hygiene/grooming, and  locomotion level  PARTICIPATION LIMITATIONS: meal prep, cleaning, laundry, driving, shopping, community activity, and church  PERSONAL FACTORS: Age, Fitness, Past/current experiences, Time since onset of injury/illness/exacerbation, and 3+ comorbidities: ADD, asthma, CHF, depression, hx R tibial plateau fx, migraine, R TKA are also affecting patient's functional outcome.   REHAB POTENTIAL: Good  CLINICAL DECISION MAKING: Evolving/moderate complexity  EVALUATION COMPLEXITY: Moderate  PLAN:  PT FREQUENCY: 1x/week  PT DURATION: 6 weeks  PLANNED INTERVENTIONS: 97164- PT Re-evaluation, 97110-Therapeutic exercises, 97530- Therapeutic activity, 97112- Neuromuscular re-education, 97535- Self Care, 02859- Manual therapy, [phone]- Gait training, [phone]- Canalith repositioning, Patient/Family education, Balance training, Stair training, Taping, Vestibular training, DME instructions, Cryotherapy, and Moist heat  PLAN FOR NEXT SESSION: vestibular assessment to see if this system is contributing to imbalance, initiate HEP for balance- turns, head turns, tandem; discuss fitness options keeping R foot pain in mind    Louana Terrilyn Christians, PT, DPT 05/29/24 12:45 PM  [facility] at [facility] [address], [address] [place], [place] [address] Phone # [phone] Fax # [phone]

## 2024-05-29 ENCOUNTER — Ambulatory Visit: Attending: Family Medicine | Admitting: Physical Therapy

## 2024-05-29 ENCOUNTER — Ambulatory Visit: Admitting: Occupational Therapy

## 2024-05-29 ENCOUNTER — Other Ambulatory Visit: Payer: Self-pay

## 2024-05-29 ENCOUNTER — Encounter: Payer: Self-pay | Admitting: Physical Therapy

## 2024-05-29 DIAGNOSIS — R2681 Unsteadiness on feet: Secondary | ICD-10-CM | POA: Diagnosis not present

## 2024-05-29 NOTE — Therapy (Signed)
 OUTPATIENT OCCUPATIONAL THERAPY NEURO EVALUATION  Patient Name: Anne Hogan MRN: [id number] DOB:1946-06-01, 78 y.o., female Today's Date: 05/29/2024  PCP: Janey Santos, MD REFERRING PROVIDER: Jama Manuelita RAMAN, NP  END OF SESSION:  OT End of Session - 05/29/24 1508     Visit Number 1    Number of Visits 1    Authorization Type [employer] [id number]    OT Start Time 1021    OT Stop Time 1051    OT Time Calculation (min) 30 min          Past Medical History:  Diagnosis Date   ADD (attention deficit disorder)    Arthritis    right knee (04/19/2013)   Asthma 04/03/2019   CHF (congestive heart failure) (HCC)    Chronic diastolic congestive heart failure (HCC)    Claustrophobia    Depression    Dyspnea    Dysrhythmia    Fracture of right tibial plateau 05/2016   GERD (gastroesophageal reflux disease)    Heart murmur    Incidental pulmonary nodule, greater than or equal to 8mm 06/06/2019   Needs f/u CT in 3 months   Migraine    bad when I was in 5th or 6th grade; I can usually ward them off by resting my head now (04/19/2013)   MVP (mitral valve prolapse)    S/P minimally-invasive mitral valve repair 08/03/2019   Complex valvuloplasty including artificial Gore-tex neochords x12 and Sorin Memo 4D ring annuloplasty, size 38 mm   S/P patent foramen ovale closure 08/03/2019   Sleep apnea    has not used mouth guard in 5 years no cpap used    Squamous carcinoma ?1999   upper left groin (04/19/2013)   Past Surgical History:  Procedure Laterality Date   CARDIAC CATHETERIZATION  05/26/2019   CHOLECYSTECTOMY  04/18/2013   CHOLECYSTECTOMY N/A 04/18/2013   Procedure: LAPAROSCOPIC CHOLECYSTECTOMY WITH INTRAOPERATIVE CHOLANGIOGRAM;  Surgeon: Lynda Leos, MD;  Location: [facility] OR;  Service: General;  Laterality: N/A;   COLONOSCOPY W/ POLYPECTOMY     EYE SURGERY Bilateral FALL  2016   IOC FOR CATARACTS   FRACTURE SURGERY     INTERCOSTAL NERVE BLOCK Left 11/23/2019    Procedure: Intercostal Nerve Block;  Surgeon: Kerrin Elspeth BROCKS, MD;  Location: [facility] OR;  Service: Thoracic;  Laterality: Left;   JOINT REPLACEMENT     KNEE ARTHROSCOPY Right 12/2007   meniscus repair (04/19/2013)   LYMPH NODE DISSECTION Left 11/23/2019   Procedure: Lymph Node Dissection;  Surgeon: Kerrin Elspeth BROCKS, MD;  Location: [facility] OR;  Service: Thoracic;  Laterality: Left;   MITRAL VALVE REPAIR Right 08/03/2019   Procedure: MINIMALLY INVASIVE MITRAL VALVE REPAIR (MVR) using Memo 4D 38 MM Mitral Valve.;  Surgeon: Dusty Sudie DEL, MD;  Location: [facility] OR;  Service: Open Heart Surgery;  Laterality: Right;   REPAIR OF PATENT FORAMEN OVALE N/A 08/03/2019   Procedure: CLOSURE OF PATENT FORAMEN OVALE;  Surgeon: Dusty Sudie DEL, MD;  Location[facility] OR;  Service: Open Heart Surgery;  Laterality: N/A;   RETINAL TEAR REPAIR CRYOTHERAPY Left ~ 1997   RIGHT/LEFT HEART CATH AND CORONARY ANGIOGRAPHY N/A 05/26/2019   Procedure: RIGHT/LEFT HEART CATH AND CORONARY ANGIOGRAPHY;  Surgeon: Dann Candyce RAMAN, MD;  Location: [facility] INVASIVE CV LAB;  Service: Cardiovascular;  Laterality: N/A;   TEE WITHOUT CARDIOVERSION N/A 05/26/2019   Procedure: TRANSESOPHAGEAL ECHOCARDIOGRAM (TEE);  Surgeon: Delford Maude BROCKS, MD;  Location: [facility] ENDOSCOPY;  Service: Cardiovascular;  Laterality: N/A;   TEE WITHOUT CARDIOVERSION  N/A 08/03/2019   Procedure: TRANSESOPHAGEAL ECHOCARDIOGRAM (TEE);  Surgeon: Dusty Sudie DEL, MD;  Location: [facility] OR;  Service: Open Heart Surgery;  Laterality: N/A;   TONSILLECTOMY  ~ 1952   TOTAL KNEE ARTHROPLASTY Right 09/21/2016   Procedure: RIGHT TOTAL KNEE ARTHROPLASTY;  Surgeon: Dempsey Moan, MD;  Location: [facility] ORS;  Service: Orthopedics;  Laterality: Right;  requests with abductor block   VARICOSE VEIN SURGERY Right 2000's   VASCULAR SURGERY     WRIST SURGERY Right 1975   had a knot taken off (04/19/2013)   Patient Active Problem List   Diagnosis Date Noted   TIA due to embolism (HCC) 05/17/2024    Mild sleep apnea 05/17/2024   TIA (transient ischemic attack) 05/09/2024   CKD stage 3a, GFR 45-59 ml/min (HCC) 05/09/2024   Morning headache 06/28/2023   Pulsatile tinnitus, left ear 06/28/2023   Snoring 06/28/2023   History of sleep apnea 06/28/2023   Adenocarcinoma, lung, left (HCC) 12/25/2019   S/P lobectomy of lung 11/23/2019   S/P minimally-invasive mitral valve repair 08/03/2019   S/P patent foramen ovale closure 08/03/2019   Chronic diastolic congestive heart failure (HCC)    Tricuspid regurgitation    Incidental pulmonary nodule, greater than or equal to 8mm 06/06/2019   Asthma 04/03/2019   OA (osteoarthritis) of knee 09/21/2016   Mitral valve prolapse 08/25/2016   Severe mitral regurgitation 08/25/2016    ONSET DATE: 05/09/24 (referral date 05/17/24)  REFERRING DIAG: G45.9 (ICD-10-CM) - Transient cerebral ischemic attack, unspecified  THERAPY DIAG:  Unsteadiness on feet  Rationale for Evaluation and Treatment: Rehabilitation  SUBJECTIVE:   SUBJECTIVE STATEMENT: Pt reports that she was talking on the phone with a friend when her speech got garbled.  Pt had a neighbor drive her to [facility] ED where she had a CT scan, she was transferred to [facility] and had an MRI.  Pt reports that they discussed TIA but did not find anything more.  Pt reports that nothing is different now, but that she is more aware of where I am and my age.  Pt reports that she has a collapsed arch in her right foot, but is to have a f/u MRI and may have surgery. Pt accompanied by: self  PERTINENT HISTORY: History of non-small lung cancer adenocarcinoma.  Status post left lower lobectomy.  Past medical history significant for asthma, OSA, chronic HFpEF, CKD 3A, stage Ia lung cancer and additional pulmonary nodules status post left lower lobectomy, and history of brief postoperative atrial fibrillation in 2021    PRECAUTIONS: Other: on 2 blood thinners,   WEIGHT BEARING RESTRICTIONS: No  PAIN:   Are you having pain? Yes: NPRS scale: 4 Pain location: R foot Pain description: sharp Aggravating factors: move too fast Relieving factors: rest  FALLS: Has patient fallen in last 6 months? No  LIVING ENVIRONMENT: Lives with: lives alone Lives in: House/apartment Stairs: 2 steps into the house, 19 steps to 2nd floor but rarely goes upstairs Has following equipment at home: Single point cane, Walker - 2 wheeled, shower chair, and Grab bars - not using AD for ambulation those were her husband's  PLOF: Independent  PATIENT GOALS: none stated  OBJECTIVE:  Note: Objective measures were completed at Evaluation unless otherwise noted.  HAND DOMINANCE: Left  ADLs: Overall ADLs: Independent, does use the shower chair and is very careful when in the shower Transfers/ambulation related to ADLs: no AD, Independent  IADLs: Independent with lightweight vacuuming and simple housekeeping, does have someone who comes  2x/ month for deeper cleaning  MOBILITY STATUS: Independent  POSTURE COMMENTS:  No Significant postural limitations  ACTIVITY TOLERANCE: Activity tolerance: WFL, reports that she has been not as active recently but has a path in her home that she can walk laps  UPPER EXTREMITY ROM:  WFL  UPPER EXTREMITY MMT:   WFL - grossly 4+/5  HAND FUNCTION: Grip strength: Right: 45 lbs; Left: 40 lbs  SENSATION: WFL  COGNITION: Overall cognitive status: Within functional limits for tasks assessed  VISION: Subjective report: wears glasses at all times Baseline vision: Wears glasses for reading only Visual history: cataracts s/p cataract removal                                                                                                                             TREATMENT DATE:  05/29/24 Educated on community reintegration and safe community re-entry in regards to concerns of balance as well as being on 2 blood thinners.  OT providing recommendations to arrive early  and sit down to avoid crowds if attending sporting or community events and to go out during off-peak times to decrease hustle and bustle which could impact both her endurance and her fall risk.     PATIENT EDUCATION: Education details: Educated on role and purpose of OT as well as potential interventions and goals for therapy based on initial evaluation findings. Person educated: Patient Education method: Explanation Education comprehension: verbalized understanding  HOME EXERCISE PROGRAM: NA   GOALS: Goals reviewed with patient? Yes  SHORT TERM GOALS: Target date: 05/29/24  Pt will verbalize understanding of safe community reintegration. Baseline:  Goal status: MET  ASSESSMENT:  CLINICAL IMPRESSION: Patient is a 78 y.o. female who was seen today for occupational therapy evaluation for TIA. Pt reporting speech concerns have resolved, but continues to have balance concerns due to collapsed arch in R foot.  Pt reporting understanding of safe community reintegration recommendations.  Pt with no further occupational therapy needs at this time.    PERFORMANCE DEFICITS: in functional skills including balance and decreased knowledge of precautions and psychosocial skills including routines and behaviors.   IMPAIRMENTS: are limiting patient from leisure and social participation.   CO-MORBIDITIES: may have co-morbidities  that affects occupational performance. Patient will benefit from skilled OT to address above impairments and improve overall function.  MODIFICATION OR ASSISTANCE TO COMPLETE EVALUATION: No modification of tasks or assist necessary to complete an evaluation.  OT OCCUPATIONAL PROFILE AND HISTORY: Problem focused assessment: Including review of records relating to presenting problem.  CLINICAL DECISION MAKING: LOW - limited treatment options, no task modification necessary  REHAB POTENTIAL: Good  EVALUATION COMPLEXITY: Low    PLAN:  OT FREQUENCY: one time  visit  OT DURATION: other: one time visit  PLANNED INTERVENTIONS: 97535 self care/ADL training, energy conservation, coping strategies training, and patient/family education  RECOMMENDED OTHER SERVICES: PT  CONSULTED AND AGREED WITH PLAN OF CARE: Patient  PLAN FOR NEXT SESSION: NA, one  time visit   KAYLENE DOMINO, OTR/L 05/29/2024, 3:09 PM  [facility] at [facility] [address] [place], [address] [place], [place] [address] Phone # [phone] Fax # [phone]

## 2024-05-30 ENCOUNTER — Telehealth: Payer: Self-pay | Admitting: Physical Therapy

## 2024-05-30 NOTE — Telephone Encounter (Signed)
 Returned pt's call concerning her question about exercises and left VM according to [facility].  Louana Terrilyn Christians, PT, DPT 05/30/24 2:54 PM  [facility] at [facility] [address] [place], [address] [place], [place] [address] Phone # [phone] Fax # [phone]

## 2024-05-31 DIAGNOSIS — M25571 Pain in right ankle and joints of right foot: Secondary | ICD-10-CM | POA: Diagnosis not present

## 2024-06-01 NOTE — Progress Notes (Unsigned)
  Electrophysiology Office Note:   Date:  06/02/2024  ID:  Anne Hogan, DOB September 03, 1945, MRN [id number]  Primary Cardiologist: Will Gladis Norton, MD Electrophysiologist: Soyla Gladis Norton, MD   Electrophysiologist:  Soyla Gladis Norton, MD      History of Present Illness:   Anne Hogan is a 78 y.o. female with h/o severe MR s/p Mini-MVR and PFO closure, palpitations, and h/o non-small cell lung cancer seen today for routine electrophysiology followup.   Of note, recently admitted for TIA and started on atrial fibrillation given significant structural history as above as well as h/o post operative AF, LAE, and palpitations.  Since last being seen in our clinic the patient reports doing OK. She did have heavy palpitations the first few days after leaving the hospital. Otherwise, she denies exertional chest pain, dyspnea, PND, orthopnea, nausea, vomiting, dizziness, syncope, edema, weight gain, or early satiety.   Review of systems complete and found to be negative unless listed in HPI.   EP Information / Studies Reviewed:    EKG is not ordered today. EKG from 05/09/2024 reviewed which showed NSR at 72 bpm       Arrhythmia/Device History No specialty comments available.   Physical Exam:   VS:  BP 118/68   Pulse 66   Ht 5' 2 (1.575 m)   Wt 162 lb 9.6 oz (73.8 kg)   SpO2 99%   BMI 29.74 kg/m    Wt Readings from Last 3 Encounters:  06/02/24 162 lb 9.6 oz (73.8 kg)  05/17/24 163 lb 9.6 oz (74.2 kg)  05/09/24 164 lb 3.9 oz (74.5 kg)     GEN: No acute distress NECK: No JVD; No carotid bruits CARDIAC: Regular rate and rhythm, no murmurs, rubs, gallops RESPIRATORY:  Clear to auscultation without rales, wheezing or rhonchi  ABDOMEN: Soft, non-tender, non-distended EXTREMITIES:  No edema; No deformity   ASSESSMENT AND PLAN:    Paroxysmal AF Palpitations Had previously occurred in post operative period, but given TIA and significant structural disease, felt prudent to  start OAC Continue eliquis 5 mg BID for CHA2DS2VASc of at least 7 Briefly discussed monitoring with loop recorder with only post operative AF noted, with high CHA2DS2VASc, in shared decision making felt best to leave on eliquis  Will place 2 week Zio and pt and neurology request. If AF noted, can bring back prior to 6 months to discuss AAD options.   VHD  Severe MR s/p MVR S/p PFO closure EF 55-60% and stable MVR by echo 04/2024  HTN Stable on current regimen   TIA No deficits Can discuss with neurology if needs to remain on plavix as well long term.   Follow up with Dr. Norton in 6 months, sooner with APP or AF clinic if monitor positive for AF.   Signed, Ozell Prentice Passey, PA-C

## 2024-06-02 ENCOUNTER — Telehealth: Payer: Self-pay | Admitting: Cardiology

## 2024-06-02 ENCOUNTER — Ambulatory Visit: Attending: Student | Admitting: Student

## 2024-06-02 ENCOUNTER — Encounter: Payer: Self-pay | Admitting: Student

## 2024-06-02 ENCOUNTER — Ambulatory Visit

## 2024-06-02 VITALS — BP 118/68 | HR 66 | Ht 62.0 in | Wt 162.6 lb

## 2024-06-02 DIAGNOSIS — G459 Transient cerebral ischemic attack, unspecified: Secondary | ICD-10-CM

## 2024-06-02 DIAGNOSIS — I340 Nonrheumatic mitral (valve) insufficiency: Secondary | ICD-10-CM | POA: Diagnosis not present

## 2024-06-02 DIAGNOSIS — I480 Paroxysmal atrial fibrillation: Secondary | ICD-10-CM | POA: Diagnosis not present

## 2024-06-02 DIAGNOSIS — R002 Palpitations: Secondary | ICD-10-CM

## 2024-06-02 NOTE — Patient Instructions (Addendum)
 Medication Instructions:  No medication changes today. *If you need a refill on your cardiac medications before your next appointment, please call your pharmacy*  Lab Work: No labwork ordered today. If you have labs (blood work) drawn today and your tests are completely normal, you will receive your results only by: MyChart Message (if you have MyChart) OR A paper copy in the mail If you have any lab test that is abnormal or we need to change your treatment, we will call you to review the results.  Testing/Procedures: 2 week Zio patch was ordered today.   Follow-Up: At [facility], you and your health needs are our priority.  As part of our continuing mission to provide you with exceptional heart care, our providers are all part of one team.  This team includes your primary Cardiologist (physician) and Advanced Practice Providers or APPs (Physician Assistants and Nurse Practitioners) who all work together to provide you with the care you need, when you need it.  Your next appointment:   6 month(s)  Provider:   You may see Will Gladis Norton, MD or one of the following Advanced Practice Providers on your designated Care Team:   Charlies Arthur, PA-C Debarah Mccumbers Andy Salena Ortlieb, PA-C Suzann Riddle, NP Daphne Barrack, NP    We recommend signing up for the patient portal called MyChart.  Sign up information is provided on this After Visit Summary.  MyChart is used to connect with patients for Virtual Visits (Telemedicine).  Patients are able to view lab/test results, encounter notes, upcoming appointments, etc.  Non-urgent messages can be sent to your provider as well.   To learn more about what you can do with MyChart, go to [email or web address].    ZIO XT- Long Term Monitor Instructions  Your physician has requested you wear a ZIO patch monitor for 14 days.  This is a single patch monitor. [employer] supplies one patch monitor per enrollment. Additional stickers are not available.  Please do not apply patch if you will be having a Nuclear Stress Test,  Echocardiogram, Cardiac CT, MRI, or Chest Xray during the period you would be wearing the  monitor. The patch cannot be worn during these tests. You cannot remove and re-apply the  ZIO XT patch monitor.  Your ZIO patch monitor will be mailed 3 day USPS to your address on file. It may take 3-5 days  to receive your monitor after you have been enrolled.  Once you have received your monitor, please review the enclosed instructions. Your monitor  has already been registered assigning a specific monitor serial # to you.  Billing and Patient Assistance Program Information  We have supplied [employer] with any of your insurance information on file for billing purposes. [employer] offers a sliding scale Patient Assistance Program for patients that do not have  insurance, or whose insurance does not completely cover the cost of the ZIO monitor.  You must apply for the Patient Assistance Program to qualify for this discounted rate.  To apply, please call [employer] at [phone], select option 4, select option 2, ask to apply for  Patient Assistance Program. Meredeth will ask your household income, and how many people  are in your household. They will quote your out-of-pocket cost based on that information.  [employer] will also be able to set up a 9-month, interest-free payment plan if needed.  Applying the monitor   Shave hair from upper left chest.  Hold abrader disc by orange tab. Rub abrader in 40 strokes over the  upper left chest as  indicated in your monitor instructions.  Clean area with 4 enclosed alcohol  pads. Let dry.  Apply patch as indicated in monitor instructions. Patch will be placed under collarbone on left  side of chest with arrow pointing upward.  Rub patch adhesive wings for 2 minutes. Remove white label marked 1. Remove the white  label marked 2. Rub patch adhesive wings for 2 additional minutes.  While  looking in a mirror, press and release button in center of patch. A small green light will  flash 3-4 times. This will be your only indicator that the monitor has been turned on.  Do not shower for the first 24 hours. You may shower after the first 24 hours.  Press the button if you feel a symptom. You will hear a small click. Record Date, Time and  Symptom in the Patient Logbook.  When you are ready to remove the patch, follow instructions on the last 2 pages of Patient  Logbook. Stick patch monitor onto the last page of Patient Logbook.  Place Patient Logbook in the blue and white box. Use locking tab on box and tape box closed  securely. The blue and white box has prepaid postage on it. Please place it in the mailbox as  soon as possible. Your physician should have your test results approximately 7 days after the  monitor has been mailed back to [facility].  Call [facility] Customer Care at [phone] if you have questions regarding  your ZIO XT patch monitor. Call them immediately if you see an orange light blinking on your  monitor.  If your monitor falls off in less than 4 days, contact our Monitor department at [phone].  If your monitor becomes loose or falls off after 4 days call [employer] at [phone] for  suggestions on securing your monitor

## 2024-06-02 NOTE — Telephone Encounter (Signed)
 Patient calling to see if there is a heart health diet that can be sent to her. Please advise

## 2024-06-02 NOTE — Telephone Encounter (Signed)
 Spoke to patient ,  heart health  diet was sent to patient . RN also informed patient she can search on Google for this information only.  Patient voiced understanding.

## 2024-06-02 NOTE — Progress Notes (Unsigned)
 Enrolled for [employer] to mail a ZIO XT long term holter monitor to the patients address on file.   Dr. Elberta Fortis to read.

## 2024-06-05 ENCOUNTER — Telehealth: Payer: Self-pay | Admitting: Neurology

## 2024-06-05 DIAGNOSIS — H6121 Impacted cerumen, right ear: Secondary | ICD-10-CM | POA: Diagnosis not present

## 2024-06-05 DIAGNOSIS — G4733 Obstructive sleep apnea (adult) (pediatric): Secondary | ICD-10-CM | POA: Diagnosis not present

## 2024-06-05 DIAGNOSIS — H903 Sensorineural hearing loss, bilateral: Secondary | ICD-10-CM | POA: Diagnosis not present

## 2024-06-05 DIAGNOSIS — Z974 Presence of external hearing-aid: Secondary | ICD-10-CM | POA: Diagnosis not present

## 2024-06-05 DIAGNOSIS — R040 Epistaxis: Secondary | ICD-10-CM | POA: Diagnosis not present

## 2024-06-05 DIAGNOSIS — H9313 Tinnitus, bilateral: Secondary | ICD-10-CM | POA: Diagnosis not present

## 2024-06-05 NOTE — Telephone Encounter (Signed)
 Mychart sent to pt.

## 2024-06-05 NOTE — Telephone Encounter (Signed)
 Called and LVM for pt to call back and schedule an [place] f/u with NP. Left office number for pt to call back and schedule.

## 2024-06-07 ENCOUNTER — Ambulatory Visit: Admitting: Physical Therapy

## 2024-06-07 ENCOUNTER — Encounter: Payer: Self-pay | Admitting: Physical Therapy

## 2024-06-07 DIAGNOSIS — R2681 Unsteadiness on feet: Secondary | ICD-10-CM

## 2024-06-07 NOTE — Therapy (Signed)
 OUTPATIENT PHYSICAL THERAPY NEURO TREATMENT NOTE   Patient Name: Anne Hogan MRN: [id number] DOB:Nov 12, 1945, 78 y.o., female Today's Date: 06/07/2024   PCP: Janey Santos, MD  REFERRING PROVIDER:  Jama Manuelita RAMAN, NP  END OF SESSION:  PT End of Session - 06/07/24 0853     Visit Number 2    Number of Visits 7    Date for Recertification  07/17/24    Authorization Type [employer]    PT Start Time 0850    PT Stop Time 0921    PT Time Calculation (min) 31 min    Activity Tolerance Patient tolerated treatment well    Behavior During Therapy [facility] for tasks assessed/performed           Past Medical History:  Diagnosis Date   ADD (attention deficit disorder)    Arthritis    right knee (04/19/2013)   Asthma 04/03/2019   CHF (congestive heart failure) (HCC)    Chronic diastolic congestive heart failure (HCC)    Claustrophobia    Depression    Dyspnea    Dysrhythmia    Fracture of right tibial plateau 05/2016   GERD (gastroesophageal reflux disease)    Heart murmur    Incidental pulmonary nodule, greater than or equal to 8mm 06/06/2019   Needs f/u CT in 3 months   Migraine    bad when I was in 5th or 6th grade; I can usually ward them off by resting my head now (04/19/2013)   MVP (mitral valve prolapse)    S/P minimally-invasive mitral valve repair 08/03/2019   Complex valvuloplasty including artificial Gore-tex neochords x12 and Sorin Memo 4D ring annuloplasty, size 38 mm   S/P patent foramen ovale closure 08/03/2019   Sleep apnea    has not used mouth guard in 5 years no cpap used    Squamous carcinoma ?1999   upper left groin (04/19/2013)   Past Surgical History:  Procedure Laterality Date   CARDIAC CATHETERIZATION  05/26/2019   CHOLECYSTECTOMY  04/18/2013   CHOLECYSTECTOMY N/A 04/18/2013   Procedure: LAPAROSCOPIC CHOLECYSTECTOMY WITH INTRAOPERATIVE CHOLANGIOGRAM;  Surgeon: Lynda Leos, MD;  Location: [facility] OR;  Service: General;  Laterality: N/A;    COLONOSCOPY W/ POLYPECTOMY     EYE SURGERY Bilateral FALL  2016   IOC FOR CATARACTS   FRACTURE SURGERY     INTERCOSTAL NERVE BLOCK Left 11/23/2019   Procedure: Intercostal Nerve Block;  Surgeon: Kerrin Elspeth BROCKS, MD;  Location: [facility] OR;  Service: Thoracic;  Laterality: Left;   JOINT REPLACEMENT     KNEE ARTHROSCOPY Right 12/2007   meniscus repair (04/19/2013)   LYMPH NODE DISSECTION Left 11/23/2019   Procedure: Lymph Node Dissection;  Surgeon: Kerrin Elspeth BROCKS, MD;  Location: [facility] OR;  Service: Thoracic;  Laterality: Left;   MITRAL VALVE REPAIR Right 08/03/2019   Procedure: MINIMALLY INVASIVE MITRAL VALVE REPAIR (MVR) using Memo 4D 38 MM Mitral Valve.;  Surgeon: Dusty Sudie DEL, MD;  Location: [facility] OR;  Service: Open Heart Surgery;  Laterality: Right;   REPAIR OF PATENT FORAMEN OVALE N/A 08/03/2019   Procedure: CLOSURE OF PATENT FORAMEN OVALE;  Surgeon: Dusty Sudie DEL, MD;  Location: [facility] OR;  Service: Open Heart Surgery;  Laterality: N/A;   RETINAL TEAR REPAIR CRYOTHERAPY Left ~ 1997   RIGHT/LEFT HEART CATH AND CORONARY ANGIOGRAPHY N/A 05/26/2019   Procedure: RIGHT/LEFT HEART CATH AND CORONARY ANGIOGRAPHY;  Surgeon: Dann Candyce RAMAN, MD;  Location: [facility] INVASIVE CV LAB;  Service: Cardiovascular;  Laterality: N/A;  TEE WITHOUT CARDIOVERSION N/A 05/26/2019   Procedure: TRANSESOPHAGEAL ECHOCARDIOGRAM (TEE);  Surgeon: Delford Maude BROCKS, MD;  Location: [facility] ENDOSCOPY;  Service: Cardiovascular;  Laterality: N/A;   TEE WITHOUT CARDIOVERSION N/A 08/03/2019   Procedure: TRANSESOPHAGEAL ECHOCARDIOGRAM (TEE);  Surgeon: Dusty Sudie DEL, MD;  Location: [facility] OR;  Service: Open Heart Surgery;  Laterality: N/A;   TONSILLECTOMY  ~ 1952   TOTAL KNEE ARTHROPLASTY Right 09/21/2016   Procedure: RIGHT TOTAL KNEE ARTHROPLASTY;  Surgeon: Dempsey Moan, MD;  Location: [facility] ORS;  Service: Orthopedics;  Laterality: Right;  requests with abductor block   VARICOSE VEIN SURGERY Right 2000's   VASCULAR SURGERY     WRIST  SURGERY Right 1975   had a knot taken off (04/19/2013)   Patient Active Problem List   Diagnosis Date Noted   TIA due to embolism (HCC) 05/17/2024   Mild sleep apnea 05/17/2024   TIA (transient ischemic attack) 05/09/2024   CKD stage 3a, GFR 45-59 ml/min (HCC) 05/09/2024   Morning headache 06/28/2023   Pulsatile tinnitus, left ear 06/28/2023   Snoring 06/28/2023   History of sleep apnea 06/28/2023   Adenocarcinoma, lung, left (HCC) 12/25/2019   S/P lobectomy of lung 11/23/2019   S/P minimally-invasive mitral valve repair 08/03/2019   S/P patent foramen ovale closure 08/03/2019   Chronic diastolic congestive heart failure (HCC)    Tricuspid regurgitation    Incidental pulmonary nodule, greater than or equal to 8mm 06/06/2019   Asthma 04/03/2019   OA (osteoarthritis) of knee 09/21/2016   Mitral valve prolapse 08/25/2016   Severe mitral regurgitation 08/25/2016    ONSET DATE: 05/09/24  REFERRING DIAG: G45.9 (ICD-10-CM) - Transient cerebral ischemic attack, unspecified  THERAPY DIAG:  Unsteadiness on feet  Rationale for Evaluation and Treatment: Rehabilitation  SUBJECTIVE:                                                                                                                                                                                             SUBJECTIVE STATEMENT: Just received a heart monitor and can't sweat until I get this off.  Go to the foot doctor tomorrow.  Pt concerned if she should stay today due to not wanting to overdo it.   Pt accompanied by: self  PERTINENT HISTORY: ADD, asthma, CHF, depression, hx R tibial plateau fx, migraine, R TKA  PAIN:  Are you having pain? No and follow up with MD for foot 06/08/2024  PRECAUTIONS: Fall*Heart monitor x 8 days starting 06/07/2024-avoid sweating and exertion  RED FLAGS: None   WEIGHT BEARING RESTRICTIONS: No  FALLS: Has patient fallen in last 6 months? No  LIVING  ENVIRONMENT: Lives with: lives  alone Lives in: House/apartment Stairs: bedroom on 1st level; 2-4 steps to enter without railing Has following equipment at home: Single point cane and Environmental Consultant - 2 wheeled  PLOF: Independent, Vocation/Vocational requirements: retired, and Leisure: painting, hanging out with her dogs  PATIENT GOALS: pt reports no goals  OBJECTIVE:    TODAY'S TREATMENT: 06/07/2024 Activity Comments  Instructed in cane/walking stick use for balance Gait x 100 ft, good sequence   Fall prevention   Standing balance exercises: Feet apart EC head turns/nods Feet together EO/EC head turns/nods Mild sway             PATIENT EDUCATION: Education details: Fall prevention education, HEP initiated Person educated: Patient Education method: Programmer, Multimedia, Facilities Manager, and Handouts Education comprehension: verbalized understanding, returned demonstration, and needs further education  Access Code: [email or web address] URL: [email or web address] Date: 06/07/2024 Prepared by: [facility]hab - [facility]  Exercises - Corner Balance Feet Together: Eyes Open With Head Turns  - 1 x daily - 5 x weekly - 2 sets - 5-10 reps - Corner Balance Feet Together: Eyes Closed With Head Turns  - 1 x daily - 5 x weekly - 2 sets - 5-10 reps - Corner Balance Feet Apart: Eyes Closed With Head Turns  - 1 x daily - 5 x weekly - 2 sets - 5-10 reps ------------------------------------------------------------- Note: Objective measures were completed at Evaluation unless otherwise noted.  DIAGNOSTIC FINDINGS: 05/09/24 brain MRI: No acute intracranial abnormality. 2. Multifocal T2 hyperintense white matter signal, most consistent with chronic small vessel disease.  COGNITION: Overall cognitive status: Within functional limits for tasks assessed   SENSATION: Pt reports chronic N/ in R great toe  COORDINATION: Alternating pronation/supination: WNL B Alternating toe tap: WNL B Finger to nose: WNL  B    MUSCLE TONE: WNL B LEs   POSTURE: elevated shoulders, FHP  LOWER EXTREMITY ROM:     Active  Right Eval Left Eval  Hip flexion    Hip extension    Hip abduction    Hip adduction    Hip internal rotation    Hip external rotation    Knee flexion    Knee extension    Ankle dorsiflexion 6 6  Ankle plantarflexion    Ankle inversion    Ankle eversion     (Blank rows = not tested)  LOWER EXTREMITY MMT:    MMT Right Eval Left Eval  Hip flexion 4+ 4+  Hip extension    Hip abduction 4+ 4+  Hip adduction 4+ 4+  Hip internal rotation    Hip external rotation    Knee flexion 4 4  Knee extension 5 5  Ankle dorsiflexion 4+ 4+  Ankle plantarflexion 4+ 4+  Ankle inversion    Ankle eversion    (Blank rows = not tested)  GAIT: Findings: Assistive device utilized:None, Level of assistance: Modified independence and SBA, and Comments: Antalgia on R LE, imbalance with turns   FUNCTIONAL TESTS:      M-CTSIB  Condition 1: Firm Surface, EO 30 Sec, Mild Sway  Condition 2: Firm Surface, EC 30 Sec, Mild and Moderate Sway  Condition 3: Foam Surface, EO 30 Sec, Mild and Moderate Sway  Condition 4: Foam Surface, EC 30 Sec, Moderate Sway  TREATMENT DATE: 05/29/24    PATIENT EDUCATION: Education details: prognosis, POC, edu on exam findings and how they relate to functional impairments  Person educated: Patient Education method: Explanation Education comprehension: verbalized understanding  HOME EXERCISE PROGRAM: Not yet initiated   GOALS: Goals reviewed with patient? Yes  SHORT TERM GOALS: Target date: 06/19/2024  Patient to be independent with initial HEP. Baseline: HEP initiated Goal status: IN PROGRESS    LONG TERM GOALS: Target date: 07/17/2024  Patient to be independent with advanced HEP. Baseline: Not yet initiated  Goal  status: IN PROGRESS  Patient to report plans to participate in home or community exercise regimen to maintain fitness.  Baseline: not participating Goal status: IN PROGRESS  Patient to score at least 23/30 on FGA in order to decrease risk of falls.  Baseline: 19 Goal status: IN PROGRESS  Patient to demonstrate mild sway with MCTSIB condition 4 to improve stability on uneven surfaces.  Baseline: moderate sway Goal status: IN PROGRESS  Patient to demonstrate good stability with turns and head turns. Baseline: consistent imbalance with turns and L head turns while walking  Goal status: IN PROGRESS  ASSESSMENT:  CLINICAL IMPRESSION: Pt presents today reporting she received a heart monitor yesterday and just started wearing it (for 8 days).  She is concerned about PT today, as she is not to exert herself or sweat while wearing the heart monitor.  Skilled PT session focused on education about fall prevention and also initiated HEP for balance, based on FGA and MCTSIB tests at eval.  Pt reports no dizziness, so did not proceed with vestibular assessment. Also educated pt in use of walking stick (which she has at home) for gait for improved stability; she sequences well and plans to use hers at home. Pt will continue to benefit from skilled PT towards goals for improved functional mobility and decreased fall risk.   OBJECTIVE IMPAIRMENTS: Abnormal gait, decreased activity tolerance, decreased balance, difficulty walking, decreased ROM, decreased strength, impaired flexibility, postural dysfunction, and pain.   ACTIVITY LIMITATIONS: carrying, lifting, bending, sitting, standing, squatting, sleeping, stairs, transfers, bathing, toileting, dressing, reach over head, hygiene/grooming, and locomotion level  PARTICIPATION LIMITATIONS: meal prep, cleaning, laundry, driving, shopping, community activity, and church  PERSONAL FACTORS: Age, Fitness, Past/current experiences, Time since onset of  injury/illness/exacerbation, and 3+ comorbidities: ADD, asthma, CHF, depression, hx R tibial plateau fx, migraine, R TKA are also affecting patient's functional outcome.   REHAB POTENTIAL: Good  CLINICAL DECISION MAKING: Evolving/moderate complexity  EVALUATION COMPLEXITY: Moderate  PLAN:  PT FREQUENCY: 1x/week  PT DURATION: 6 weeks  PLANNED INTERVENTIONS: 97164- PT Re-evaluation, 97110-Therapeutic exercises, 97530- Therapeutic activity, 97112- Neuromuscular re-education, 97535- Self Care, 02859- Manual therapy, [phone]- Gait training, [phone]- Canalith repositioning, Patient/Family education, Balance training, Stair training, Taping, Vestibular training, DME instructions, Cryotherapy, and Moist heat  PLAN FOR NEXT SESSION: vestibular assessment to see if this system is contributing to imbalance, review HEP for balance- turns, head turns, tandem; discuss fitness options keeping R foot pain in mind (*Ask about foot MD appt)   Greig Anon, PT 06/07/24 9:21 AM Phone: [phone] Fax: [phone]  [facility] at [facility] [address] [place], [place] [address] Phone # [phone] Fax # [phone]

## 2024-06-08 DIAGNOSIS — M76821 Posterior tibial tendinitis, right leg: Secondary | ICD-10-CM | POA: Diagnosis not present

## 2024-06-08 DIAGNOSIS — M2141 Flat foot [pes planus] (acquired), right foot: Secondary | ICD-10-CM | POA: Diagnosis not present

## 2024-06-09 ENCOUNTER — Telehealth (HOSPITAL_BASED_OUTPATIENT_CLINIC_OR_DEPARTMENT_OTHER): Payer: Self-pay

## 2024-06-09 NOTE — Telephone Encounter (Signed)
   Pre-operative Risk Assessment    Patient Name: Anne Hogan  DOB: 02-23-1946 MRN: [id number]   Date of last office visit: 06/02/24 with Lesia Date of next office visit: NA  Request for Surgical Clearance    Procedure:  Right Posterior Tibialis Tendon Reconstruction   Date of Surgery:  Clearance TBD                                 Surgeon:  Dr. Barton Socks Group or Practice Name:  [facility] Phone number:  [phone] Fax number:  [phone]   Type of Clearance Requested:   - Medical  - Pharmacy:  Hold Apixaban  (Eliquis ) not indicated   Type of Anesthesia:  Not Indicated   Additional requests/questions:    Anne Hogan   06/09/2024, 2:10 PM

## 2024-06-09 NOTE — Telephone Encounter (Signed)
   Name: Anne Hogan  DOB: [birth year]  MRN: [id number]  Primary Cardiologist: Will Gladis Norton, MD  Chart reviewed as part of pre-operative protocol coverage. Because of CALVARY DIFRANCO past medical history and time since last visit, she will require a follow-up in-office visit in order to better assess preoperative cardiovascular risk.Has not been seen since 2023  Pre-op covering staff: - Please schedule appointment and call patient to inform them. If patient already had an upcoming appointment within acceptable timeframe, please add pre-op clearance to the appointment notes so provider is aware. - Please contact requesting surgeon's office via preferred method (i.e, phone, fax) to inform them of need for appointment prior to surgery.  Patient is not prescribed Eliquis  per our practice. He has hx of TIA and is followed by neurology, Dr. Elgin Lam.  Please reach out to that practice for recommendations on Eliquis .   Lamarr Satterfield, NP  06/09/2024, 2:25 PM

## 2024-06-09 NOTE — Telephone Encounter (Signed)
 1st attempt to reach pt regarding surgical clearance and the need for an IN OFFICE appointment.  Left pt a detailed message to call back and get that scheduled.

## 2024-06-12 ENCOUNTER — Telehealth: Payer: Self-pay | Admitting: *Deleted

## 2024-06-12 NOTE — Telephone Encounter (Signed)
 Pt was seen by Jodie Passey, [facility] 06/02/24. Will f/u with preop APP if the pt needs another appt?

## 2024-06-12 NOTE — Telephone Encounter (Signed)
 Received clearance form for surgery for pt R posterior tibialis tendon reconstruction.  See Dr. Rosemarie is hospital 05-10-2024.  Dr. Margean 05-17-2024.

## 2024-06-13 ENCOUNTER — Ambulatory Visit

## 2024-06-13 DIAGNOSIS — R2681 Unsteadiness on feet: Secondary | ICD-10-CM

## 2024-06-13 NOTE — Therapy (Signed)
 OUTPATIENT PHYSICAL THERAPY NEURO TREATMENT NOTE   Patient Name: Anne Hogan MRN: [id number] DOB:09-04-1945, 78 y.o., female Today's Date: 06/13/2024   PCP: Janey Santos, MD  REFERRING PROVIDER:  Jama Manuelita RAMAN, NP  END OF SESSION:  PT End of Session - 06/13/24 1148     Visit Number 3    Number of Visits 7    Date for Recertification  07/17/24    Authorization Type [employer]    PT Start Time 1145    PT Stop Time 1230    PT Time Calculation (min) 45 min    Activity Tolerance Patient tolerated treatment well    Behavior During Therapy WFL for tasks assessed/performed           Past Medical History:  Diagnosis Date   ADD (attention deficit disorder)    Arthritis    right knee (04/19/2013)   Asthma 04/03/2019   CHF (congestive heart failure) (HCC)    Chronic diastolic congestive heart failure (HCC)    Claustrophobia    Depression    Dyspnea    Dysrhythmia    Fracture of right tibial plateau 05/2016   GERD (gastroesophageal reflux disease)    Heart murmur    Incidental pulmonary nodule, greater than or equal to 8mm 06/06/2019   Needs f/u CT in 3 months   Migraine    bad when I was in 5th or 6th grade; I can usually ward them off by resting my head now (04/19/2013)   MVP (mitral valve prolapse)    S/P minimally-invasive mitral valve repair 08/03/2019   Complex valvuloplasty including artificial Gore-tex neochords x12 and Sorin Memo 4D ring annuloplasty, size 38 mm   S/P patent foramen ovale closure 08/03/2019   Sleep apnea    has not used mouth guard in 5 years no cpap used    Squamous carcinoma ?1999   upper left groin (04/19/2013)   Past Surgical History:  Procedure Laterality Date   CARDIAC CATHETERIZATION  05/26/2019   CHOLECYSTECTOMY  04/18/2013   CHOLECYSTECTOMY N/A 04/18/2013   Procedure: LAPAROSCOPIC CHOLECYSTECTOMY WITH INTRAOPERATIVE CHOLANGIOGRAM;  Surgeon: Lynda Leos, MD;  Location: [facility] OR;  Service: General;  Laterality: N/A;    COLONOSCOPY W/ POLYPECTOMY     EYE SURGERY Bilateral FALL  2016   IOC FOR CATARACTS   FRACTURE SURGERY     INTERCOSTAL NERVE BLOCK Left 11/23/2019   Procedure: Intercostal Nerve Block;  Surgeon: Kerrin Elspeth BROCKS, MD;  Location: [facility] OR;  Service: Thoracic;  Laterality: Left;   JOINT REPLACEMENT     KNEE ARTHROSCOPY Right 12/2007   meniscus repair (04/19/2013)   LYMPH NODE DISSECTION Left 11/23/2019   Procedure: Lymph Node Dissection;  Surgeon: Kerrin Elspeth BROCKS, MD;  Location: [facility] OR;  Service: Thoracic;  Laterality: Left;   MITRAL VALVE REPAIR Right 08/03/2019   Procedure: MINIMALLY INVASIVE MITRAL VALVE REPAIR (MVR) using Memo 4D 38 MM Mitral Valve.;  Surgeon: Dusty Sudie DEL, MD;  Location: [facility] OR;  Service: Open Heart Surgery;  Laterality: Right;   REPAIR OF PATENT FORAMEN OVALE N/A 08/03/2019   Procedure: CLOSURE OF PATENT FORAMEN OVALE;  Surgeon: Dusty Sudie DEL, MD;  Location: [facility] OR;  Service: Open Heart Surgery;  Laterality: N/A;   RETINAL TEAR REPAIR CRYOTHERAPY Left ~ 1997   RIGHT/LEFT HEART CATH AND CORONARY ANGIOGRAPHY N/A 05/26/2019   Procedure: RIGHT/LEFT HEART CATH AND CORONARY ANGIOGRAPHY;  Surgeon: Dann Candyce RAMAN, MD;  Location: [facility] INVASIVE CV LAB;  Service: Cardiovascular;  Laterality: N/A;  TEE WITHOUT CARDIOVERSION N/A 05/26/2019   Procedure: TRANSESOPHAGEAL ECHOCARDIOGRAM (TEE);  Surgeon: Delford Maude BROCKS, MD;  Location: [facility] ENDOSCOPY;  Service: Cardiovascular;  Laterality: N/A;   TEE WITHOUT CARDIOVERSION N/A 08/03/2019   Procedure: TRANSESOPHAGEAL ECHOCARDIOGRAM (TEE);  Surgeon: Dusty Sudie DEL, MD;  Location: [facility] OR;  Service: Open Heart Surgery;  Laterality: N/A;   TONSILLECTOMY  ~ 1952   TOTAL KNEE ARTHROPLASTY Right 09/21/2016   Procedure: RIGHT TOTAL KNEE ARTHROPLASTY;  Surgeon: Dempsey Moan, MD;  Location: [facility] ORS;  Service: Orthopedics;  Laterality: Right;  requests with abductor block   VARICOSE VEIN SURGERY Right 2000's   VASCULAR SURGERY     WRIST  SURGERY Right 1975   had a knot taken off (04/19/2013)   Patient Active Problem List   Diagnosis Date Noted   TIA due to embolism (HCC) 05/17/2024   Mild sleep apnea 05/17/2024   TIA (transient ischemic attack) 05/09/2024   CKD stage 3a, GFR 45-59 ml/min (HCC) 05/09/2024   Morning headache 06/28/2023   Pulsatile tinnitus, left ear 06/28/2023   Snoring 06/28/2023   History of sleep apnea 06/28/2023   Adenocarcinoma, lung, left (HCC) 12/25/2019   S/P lobectomy of lung 11/23/2019   S/P minimally-invasive mitral valve repair 08/03/2019   S/P patent foramen ovale closure 08/03/2019   Chronic diastolic congestive heart failure (HCC)    Tricuspid regurgitation    Incidental pulmonary nodule, greater than or equal to 8mm 06/06/2019   Asthma 04/03/2019   OA (osteoarthritis) of knee 09/21/2016   Mitral valve prolapse 08/25/2016   Severe mitral regurgitation 08/25/2016    ONSET DATE: 05/09/24  REFERRING DIAG: G45.9 (ICD-10-CM) - Transient cerebral ischemic attack, unspecified  THERAPY DIAG:  Unsteadiness on feet  Rationale for Evaluation and Treatment: Rehabilitation  SUBJECTIVE:                                                                                                                                                                                             SUBJECTIVE STATEMENT: I think the imbalance is mainly coming from the right foot/ankle problems.    Pt accompanied by: self  PERTINENT HISTORY: ADD, asthma, CHF, depression, hx R tibial plateau fx, migraine, R TKA  PAIN:  Are you having pain? No and follow up with MD for foot 06/08/2024  PRECAUTIONS: Fall*Heart monitor x 8 days starting 06/07/2024-avoid sweating and exertion  RED FLAGS: None   WEIGHT BEARING RESTRICTIONS: No  FALLS: Has patient fallen in last 6 months? No  LIVING ENVIRONMENT: Lives with: lives alone Lives in: House/apartment Stairs: bedroom on 1st level; 2-4 steps to enter without  railing Has following  equipment at home: Single point cane and Walker - 2 wheeled  PLOF: Independent, Vocation/Vocational requirements: retired, and Leisure: painting, hanging out with her dogs  PATIENT GOALS: pt reports no goals  OBJECTIVE:   TODAY'S TREATMENT: 06/13/24 Activity Comments  Static multisensory balance -firm-compliant surfaces--min sway  Dynamic standing  -emphasis on backwards and single limb stance-SBA-CGA  Pt education Discussion regarding modifiable vs non-mod risk factors for cardiovascular health and discussed healthy exercise habits for reducing risks.   Long sitting ankle stretch For right tib post tendon stretch            TODAY'S TREATMENT: 06/07/2024 Activity Comments  Instructed in cane/walking stick use for balance Gait x 100 ft, good sequence   Fall prevention   Standing balance exercises: Feet apart EC head turns/nods Feet together EO/EC head turns/nods Mild sway             PATIENT EDUCATION: Education details: Fall prevention education, HEP initiated Person educated: Patient Education method: Programmer, Multimedia, Facilities Manager, and Handouts Education comprehension: verbalized understanding, returned demonstration, and needs further education  Access Code: [email or web address] URL: [email or web address] Date: 06/07/2024 Prepared by: [facility] - Outpatient  Rehab - [facility]  Exercises - Corner Balance Feet Together: Eyes Open With Head Turns  - 1 x daily - 5 x weekly - 2 sets - 5-10 reps - Corner Balance Feet Together: Eyes Closed With Head Turns  - 1 x daily - 5 x weekly - 2 sets - 5-10 reps - Corner Balance Feet Apart: Eyes Closed With Head Turns  - 1 x daily - 5 x weekly - 2 sets - 5-10 reps - Long Sitting Plantar Fascia Stretch with Towel  - 1 x daily - 7 x weekly - 3 sets - 40-60 sec hold ------------------------------------------------------------- Note: Objective measures were completed at Evaluation unless otherwise  noted.  DIAGNOSTIC FINDINGS: 05/09/24 brain MRI: No acute intracranial abnormality. 2. Multifocal T2 hyperintense white matter signal, most consistent with chronic small vessel disease.  COGNITION: Overall cognitive status: Within functional limits for tasks assessed   SENSATION: Pt reports chronic N/ in R great toe  COORDINATION: Alternating pronation/supination: WNL B Alternating toe tap: WNL B Finger to nose: WNL B    MUSCLE TONE: WNL B LEs   POSTURE: elevated shoulders, FHP  LOWER EXTREMITY ROM:     Active  Right Eval Left Eval  Hip flexion    Hip extension    Hip abduction    Hip adduction    Hip internal rotation    Hip external rotation    Knee flexion    Knee extension    Ankle dorsiflexion 6 6  Ankle plantarflexion    Ankle inversion    Ankle eversion     (Blank rows = not tested)  LOWER EXTREMITY MMT:    MMT Right Eval Left Eval  Hip flexion 4+ 4+  Hip extension    Hip abduction 4+ 4+  Hip adduction 4+ 4+  Hip internal rotation    Hip external rotation    Knee flexion 4 4  Knee extension 5 5  Ankle dorsiflexion 4+ 4+  Ankle plantarflexion 4+ 4+  Ankle inversion    Ankle eversion    (Blank rows = not tested)  GAIT: Findings: Assistive device utilized:None, Level of assistance: Modified independence and SBA, and Comments: Antalgia on R LE, imbalance with turns   FUNCTIONAL TESTS:      M-CTSIB  Condition 1: Firm Surface, EO 30 Sec, Mild Sway  Condition 2: Firm  Surface, EC 30 Sec, Mild and Moderate Sway  Condition 3: Foam Surface, EO 30 Sec, Mild and Moderate Sway  Condition 4: Foam Surface, EC 30 Sec, Moderate Sway                                                                                                                                 TREATMENT DATE: 05/29/24    PATIENT EDUCATION: Education details: prognosis, POC, edu on exam findings and how they relate to functional impairments  Person educated: Patient Education  method: Explanation Education comprehension: verbalized understanding  HOME EXERCISE PROGRAM: Not yet initiated   GOALS: Goals reviewed with patient? Yes  SHORT TERM GOALS: Target date: 06/19/2024  Patient to be independent with initial HEP. Baseline: HEP initiated Goal status: IN PROGRESS    LONG TERM GOALS: Target date: 07/17/2024  Patient to be independent with advanced HEP. Baseline: Not yet initiated  Goal status: IN PROGRESS  Patient to report plans to participate in home or community exercise regimen to maintain fitness.  Baseline: not participating Goal status: IN PROGRESS  Patient to score at least 23/30 on FGA in order to decrease risk of falls.  Baseline: 19 Goal status: IN PROGRESS  Patient to demonstrate mild sway with MCTSIB condition 4 to improve stability on uneven surfaces.  Baseline: moderate sway Goal status: IN PROGRESS  Patient to demonstrate good stability with turns and head turns. Baseline: consistent imbalance with turns and L head turns while walking  Goal status: IN PROGRESS  ASSESSMENT:  CLINICAL IMPRESSION: Pt presents today reporting she received a heart monitor yesterday and just started wearing it (for 8 days).  She is concerned about PT today, as she is not to exert herself or sweat while wearing the heart monitor.  Skilled PT session focused on education about fall prevention and also initiated HEP for balance, based on FGA and MCTSIB tests at eval.  Pt reports no dizziness, so did not proceed with vestibular assessment. Also educated pt in use of walking stick (which she has at home) for gait for improved stability; she sequences well and plans to use hers at home. Pt will continue to benefit from skilled PT towards goals for improved functional mobility and decreased fall risk.   OBJECTIVE IMPAIRMENTS: Abnormal gait, decreased activity tolerance, decreased balance, difficulty walking, decreased ROM, decreased strength, impaired  flexibility, postural dysfunction, and pain.   ACTIVITY LIMITATIONS: carrying, lifting, bending, sitting, standing, squatting, sleeping, stairs, transfers, bathing, toileting, dressing, reach over head, hygiene/grooming, and locomotion level  PARTICIPATION LIMITATIONS: meal prep, cleaning, laundry, driving, shopping, community activity, and church  PERSONAL FACTORS: Age, Fitness, Past/current experiences, Time since onset of injury/illness/exacerbation, and 3+ comorbidities: ADD, asthma, CHF, depression, hx R tibial plateau fx, migraine, R TKA are also affecting patient's functional outcome.   REHAB POTENTIAL: Good  CLINICAL DECISION MAKING: Evolving/moderate complexity  EVALUATION COMPLEXITY: Moderate  PLAN:  PT FREQUENCY: 1x/week  PT DURATION: 6 weeks  PLANNED INTERVENTIONS: 97164- PT Re-evaluation, 97110-Therapeutic exercises, 97530- Therapeutic activity, 97112- Neuromuscular re-education, [phone]- Self Care, [id number]- Manual therapy, [phone]- Gait training, [phone]- Canalith repositioning, Patient/Family education, Balance training, Stair training, Taping, Vestibular training, DME instructions, Cryotherapy, and Moist heat  PLAN FOR NEXT SESSION: vestibular assessment to see if this system is contributing to imbalance, review HEP for balance- turns, head turns, tandem; discuss fitness options keeping R foot pain in mind (*Ask about foot MD appt)   Greig Anon, PT 06/13/24 11:48 AM Phone: [phone] Fax: [phone]  [facility] at [facility] [address] [place], [place] [address] Phone # [phone] Fax # [phone]

## 2024-06-14 NOTE — Telephone Encounter (Signed)
  Received clearance form for surgery for pt R posterior tibialis tendon reconstruction. See Dr. Rosemarie is hospital 05-10-2024.   Per Dr Rosemarie:  TIA in October , started on antiplatelets.  Should not interrupt antiplatelet therapy until January 2026, 3 months after TIA.     Dedra Gores, MD

## 2024-06-17 ENCOUNTER — Other Ambulatory Visit (HOSPITAL_COMMUNITY): Payer: Self-pay

## 2024-06-19 NOTE — Therapy (Incomplete)
 OUTPATIENT PHYSICAL THERAPY NEURO TREATMENT NOTE   Patient Name: Anne Hogan MRN: [id number] DOB:09-03-1945, 78 y.o., female Today's Date: 06/19/2024   PCP: Janey Santos, MD  REFERRING PROVIDER:  Jama Manuelita RAMAN, NP  END OF SESSION:     Past Medical History:  Diagnosis Date   ADD (attention deficit disorder)    Arthritis    right knee (04/19/2013)   Asthma 04/03/2019   CHF (congestive heart failure) (HCC)    Chronic diastolic congestive heart failure (HCC)    Claustrophobia    Depression    Dyspnea    Dysrhythmia    Fracture of right tibial plateau 05/2016   GERD (gastroesophageal reflux disease)    Heart murmur    Incidental pulmonary nodule, greater than or equal to 8mm 06/06/2019   Needs f/u CT in 3 months   Migraine    bad when I was in 5th or 6th grade; I can usually ward them off by resting my head now (04/19/2013)   MVP (mitral valve prolapse)    S/P minimally-invasive mitral valve repair 08/03/2019   Complex valvuloplasty including artificial Gore-tex neochords x12 and Sorin Memo 4D ring annuloplasty, size 38 mm   S/P patent foramen ovale closure 08/03/2019   Sleep apnea    has not used mouth guard in 5 years no cpap used    Squamous carcinoma ?1999   upper left groin (04/19/2013)   Past Surgical History:  Procedure Laterality Date   CARDIAC CATHETERIZATION  05/26/2019   CHOLECYSTECTOMY  04/18/2013   CHOLECYSTECTOMY N/A 04/18/2013   Procedure: LAPAROSCOPIC CHOLECYSTECTOMY WITH INTRAOPERATIVE CHOLANGIOGRAM;  Surgeon: Lynda Leos, MD;  Location: [facility] OR;  Service: General;  Laterality: N/A;   COLONOSCOPY W/ POLYPECTOMY     EYE SURGERY Bilateral FALL  2016   IOC FOR CATARACTS   FRACTURE SURGERY     INTERCOSTAL NERVE BLOCK Left 11/23/2019   Procedure: Intercostal Nerve Block;  Surgeon: Kerrin Elspeth BROCKS, MD;  Location: [facility] OR;  Service: Thoracic;  Laterality: Left;   JOINT REPLACEMENT     KNEE ARTHROSCOPY Right 12/2007   meniscus repair  (04/19/2013)   LYMPH NODE DISSECTION Left 11/23/2019   Procedure: Lymph Node Dissection;  Surgeon: Kerrin Elspeth BROCKS, MD;  Location: [facility] OR;  Service: Thoracic;  Laterality: Left;   MITRAL VALVE REPAIR Right 08/03/2019   Procedure: MINIMALLY INVASIVE MITRAL VALVE REPAIR (MVR) using Memo 4D 38 MM Mitral Valve.;  Surgeon: Dusty Sudie DEL, MD;  Location: [facility] OR;  Service: Open Heart Surgery;  Laterality: Right;   REPAIR OF PATENT FORAMEN OVALE N/A 08/03/2019   Procedure: CLOSURE OF PATENT FORAMEN OVALE;  Surgeon: Dusty Sudie DEL, MD;  Location: [facility] OR;  Service: Open Heart Surgery;  Laterality: N/A;   RETINAL TEAR REPAIR CRYOTHERAPY Left ~ 1997   RIGHT/LEFT HEART CATH AND CORONARY ANGIOGRAPHY N/A 05/26/2019   Procedure: RIGHT/LEFT HEART CATH AND CORONARY ANGIOGRAPHY;  Surgeon: Dann Candyce RAMAN, MD;  Location: [facility] INVASIVE CV LAB;  Service: Cardiovascular;  Laterality: N/A;   TEE WITHOUT CARDIOVERSION N/A 05/26/2019   Procedure: TRANSESOPHAGEAL ECHOCARDIOGRAM (TEE);  Surgeon: Delford Maude BROCKS, MD;  Location: [facility] ENDOSCOPY;  Service: Cardiovascular;  Laterality: N/A;   TEE WITHOUT CARDIOVERSION N/A 08/03/2019   Procedure: TRANSESOPHAGEAL ECHOCARDIOGRAM (TEE);  Surgeon: Dusty Sudie DEL, MD;  Location: [facility] OR;  Service: Open Heart Surgery;  Laterality: N/A;   TONSILLECTOMY  ~ 1952   TOTAL KNEE ARTHROPLASTY Right 09/21/2016   Procedure: RIGHT TOTAL KNEE ARTHROPLASTY;  Surgeon: Dempsey Moan, MD;  Location:  [facility] ORS;  Service: Orthopedics;  Laterality: Right;  requests with abductor block   VARICOSE VEIN SURGERY Right 2000's   VASCULAR SURGERY     WRIST SURGERY Right 1975   had a knot taken off (04/19/2013)   Patient Active Problem List   Diagnosis Date Noted   TIA due to embolism (HCC) 05/17/2024   Mild sleep apnea 05/17/2024   TIA (transient ischemic attack) 05/09/2024   CKD stage 3a, GFR 45-59 ml/min (HCC) 05/09/2024   Morning headache 06/28/2023   Pulsatile tinnitus, left ear 06/28/2023    Snoring 06/28/2023   History of sleep apnea 06/28/2023   Adenocarcinoma, lung, left (HCC) 12/25/2019   S/P lobectomy of lung 11/23/2019   S/P minimally-invasive mitral valve repair 08/03/2019   S/P patent foramen ovale closure 08/03/2019   Chronic diastolic congestive heart failure (HCC)    Tricuspid regurgitation    Incidental pulmonary nodule, greater than or equal to 8mm 06/06/2019   Asthma 04/03/2019   OA (osteoarthritis) of knee 09/21/2016   Mitral valve prolapse 08/25/2016   Severe mitral regurgitation 08/25/2016    ONSET DATE: 05/09/24  REFERRING DIAG: G45.9 (ICD-10-CM) - Transient cerebral ischemic attack, unspecified  THERAPY DIAG:  No diagnosis found.  Rationale for Evaluation and Treatment: Rehabilitation  SUBJECTIVE:                                                                                                                                                                                             SUBJECTIVE STATEMENT: I think the imbalance is mainly coming from the right foot/ankle problems.    Pt accompanied by: self  PERTINENT HISTORY: ADD, asthma, CHF, depression, hx R tibial plateau fx, migraine, R TKA  PAIN:  Are you having pain? No and follow up with MD for foot 06/08/2024  PRECAUTIONS: Fall*Heart monitor x 8 days starting 06/07/2024-avoid sweating and exertion  RED FLAGS: None   WEIGHT BEARING RESTRICTIONS: No  FALLS: Has patient fallen in last 6 months? No  LIVING ENVIRONMENT: Lives with: lives alone Lives in: House/apartment Stairs: bedroom on 1st level; 2-4 steps to enter without railing Has following equipment at home: Single point cane and Environmental Consultant - 2 wheeled  PLOF: Independent, Vocation/Vocational requirements: retired, and Leisure: painting, hanging out with her dogs  PATIENT GOALS: pt reports no goals  OBJECTIVE:     TODAY'S TREATMENT: 06/20/24 Activity Comments                       TODAY'S TREATMENT:  06/13/24 Activity Comments  Static multisensory balance -firm-compliant surfaces--min sway  Dynamic standing  -emphasis on backwards and  single limb stance-SBA-CGA  Pt education Discussion regarding modifiable vs non-mod risk factors for cardiovascular health and discussed healthy exercise habits for reducing risks.   Long sitting ankle stretch For right tib post tendon stretch            TODAY'S TREATMENT: 06/07/2024 Activity Comments  Instructed in cane/walking stick use for balance Gait x 100 ft, good sequence   Fall prevention   Standing balance exercises: Feet apart EC head turns/nods Feet together EO/EC head turns/nods Mild sway             PATIENT EDUCATION: Education details: Fall prevention education, HEP initiated Person educated: Patient Education method: Programmer, Multimedia, Facilities Manager, and Handouts Education comprehension: verbalized understanding, returned demonstration, and needs further education  Access Code: [email or web address] URL: [email or web address] Date: 06/07/2024 Prepared by: [facility] - Outpatient  Rehab - [facility]  Exercises - Corner Balance Feet Together: Eyes Open With Head Turns  - 1 x daily - 5 x weekly - 2 sets - 5-10 reps - Corner Balance Feet Together: Eyes Closed With Head Turns  - 1 x daily - 5 x weekly - 2 sets - 5-10 reps - Corner Balance Feet Apart: Eyes Closed With Head Turns  - 1 x daily - 5 x weekly - 2 sets - 5-10 reps - Long Sitting Plantar Fascia Stretch with Towel  - 1 x daily - 7 x weekly - 3 sets - 40-60 sec hold ------------------------------------------------------------- Note: Objective measures were completed at Evaluation unless otherwise noted.  DIAGNOSTIC FINDINGS: 05/09/24 brain MRI: No acute intracranial abnormality. 2. Multifocal T2 hyperintense white matter signal, most consistent with chronic small vessel disease.  COGNITION: Overall cognitive status: Within functional limits for tasks  assessed   SENSATION: Pt reports chronic N/ in R great toe  COORDINATION: Alternating pronation/supination: WNL B Alternating toe tap: WNL B Finger to nose: WNL B    MUSCLE TONE: WNL B LEs   POSTURE: elevated shoulders, FHP  LOWER EXTREMITY ROM:     Active  Right Eval Left Eval  Hip flexion    Hip extension    Hip abduction    Hip adduction    Hip internal rotation    Hip external rotation    Knee flexion    Knee extension    Ankle dorsiflexion 6 6  Ankle plantarflexion    Ankle inversion    Ankle eversion     (Blank rows = not tested)  LOWER EXTREMITY MMT:    MMT Right Eval Left Eval  Hip flexion 4+ 4+  Hip extension    Hip abduction 4+ 4+  Hip adduction 4+ 4+  Hip internal rotation    Hip external rotation    Knee flexion 4 4  Knee extension 5 5  Ankle dorsiflexion 4+ 4+  Ankle plantarflexion 4+ 4+  Ankle inversion    Ankle eversion    (Blank rows = not tested)  GAIT: Findings: Assistive device utilized:None, Level of assistance: Modified independence and SBA, and Comments: Antalgia on R LE, imbalance with turns   FUNCTIONAL TESTS:      M-CTSIB  Condition 1: Firm Surface, EO 30 Sec, Mild Sway  Condition 2: Firm Surface, EC 30 Sec, Mild and Moderate Sway  Condition 3: Foam Surface, EO 30 Sec, Mild and Moderate Sway  Condition 4: Foam Surface, EC 30 Sec, Moderate Sway  TREATMENT DATE: 05/29/24    PATIENT EDUCATION: Education details: prognosis, POC, edu on exam findings and how they relate to functional impairments  Person educated: Patient Education method: Explanation Education comprehension: verbalized understanding  HOME EXERCISE PROGRAM: Not yet initiated   GOALS: Goals reviewed with patient? Yes  SHORT TERM GOALS: Target date: 06/19/2024  Patient to be independent with initial HEP. Baseline: HEP  initiated Goal status: IN PROGRESS    LONG TERM GOALS: Target date: 07/17/2024  Patient to be independent with advanced HEP. Baseline: Not yet initiated  Goal status: IN PROGRESS  Patient to report plans to participate in home or community exercise regimen to maintain fitness.  Baseline: not participating Goal status: IN PROGRESS  Patient to score at least 23/30 on FGA in order to decrease risk of falls.  Baseline: 19 Goal status: IN PROGRESS  Patient to demonstrate mild sway with MCTSIB condition 4 to improve stability on uneven surfaces.  Baseline: moderate sway Goal status: IN PROGRESS  Patient to demonstrate good stability with turns and head turns. Baseline: consistent imbalance with turns and L head turns while walking  Goal status: IN PROGRESS  ASSESSMENT:  CLINICAL IMPRESSION: Pt presents today reporting she received a heart monitor yesterday and just started wearing it (for 8 days).  She is concerned about PT today, as she is not to exert herself or sweat while wearing the heart monitor.  Skilled PT session focused on education about fall prevention and also initiated HEP for balance, based on FGA and MCTSIB tests at eval.  Pt reports no dizziness, so did not proceed with vestibular assessment. Also educated pt in use of walking stick (which she has at home) for gait for improved stability; she sequences well and plans to use hers at home. Pt will continue to benefit from skilled PT towards goals for improved functional mobility and decreased fall risk.   OBJECTIVE IMPAIRMENTS: Abnormal gait, decreased activity tolerance, decreased balance, difficulty walking, decreased ROM, decreased strength, impaired flexibility, postural dysfunction, and pain.   ACTIVITY LIMITATIONS: carrying, lifting, bending, sitting, standing, squatting, sleeping, stairs, transfers, bathing, toileting, dressing, reach over head, hygiene/grooming, and locomotion level  PARTICIPATION LIMITATIONS:  meal prep, cleaning, laundry, driving, shopping, community activity, and church  PERSONAL FACTORS: Age, Fitness, Past/current experiences, Time since onset of injury/illness/exacerbation, and 3+ comorbidities: ADD, asthma, CHF, depression, hx R tibial plateau fx, migraine, R TKA are also affecting patient's functional outcome.   REHAB POTENTIAL: Good  CLINICAL DECISION MAKING: Evolving/moderate complexity  EVALUATION COMPLEXITY: Moderate  PLAN:  PT FREQUENCY: 1x/week  PT DURATION: 6 weeks  PLANNED INTERVENTIONS: [id number]- PT Re-evaluation, 97110-Therapeutic exercises, 97530- Therapeutic activity, 97112- Neuromuscular re-education, 97535- Self Care, [id number]- Manual therapy, [phone]- Gait training, [phone]- Canalith repositioning, Patient/Family education, Balance training, Stair training, Taping, Vestibular training, DME instructions, Cryotherapy, and Moist heat  PLAN FOR NEXT SESSION: vestibular assessment to see if this system is contributing to imbalance, review HEP for balance- turns, head turns, tandem; discuss fitness options keeping R foot pain in mind (*Ask about foot MD appt)

## 2024-06-20 ENCOUNTER — Ambulatory Visit: Admitting: Physical Therapy

## 2024-06-20 NOTE — Telephone Encounter (Signed)
 SURGICAL CLEARANCE FAXED TO [facility] AND RECEIVED CONFIRMATION

## 2024-06-21 ENCOUNTER — Ambulatory Visit

## 2024-06-22 NOTE — Telephone Encounter (Signed)
 I will send this to preop APP to review the notes  from 06/12/24

## 2024-06-22 NOTE — Telephone Encounter (Signed)
 Pt returned call to f/u please advise

## 2024-06-22 NOTE — Telephone Encounter (Signed)
   Patient Name: Anne Hogan  DOB: 07/11/1946 MRN: [id number]  Primary Cardiologist: Will Gladis Norton, MD  Chart reviewed as part of pre-operative protocol coverage. Given past medical history and time since last visit, based on ACC/AHA guidelines, Anne Hogan is at acceptable risk for the planned procedure without further cardiovascular testing.   However: Notes from Dr.Sethi (on chart review) state: Per Dr Rosemarie:  TIA in October , started on antiplatelets.  Should not interrupt antiplatelet therapy until January 2026, 3 months after TIA.   Please contact Dr.Sethi's office for recommendations.   The patient was advised that if she develops new symptoms prior to surgery to contact our office to arrange for a follow-up visit, and she verbalized understanding.  I will route this recommendation to the requesting party via Epic fax function and remove from pre-op pool.  Please call with questions.  Lamarr Satterfield, NP 06/22/2024, 2:51 PM

## 2024-06-26 DIAGNOSIS — R002 Palpitations: Secondary | ICD-10-CM | POA: Diagnosis not present

## 2024-06-27 ENCOUNTER — Ambulatory Visit

## 2024-06-27 ENCOUNTER — Ambulatory Visit: Payer: Self-pay | Admitting: Student

## 2024-06-27 DIAGNOSIS — R2681 Unsteadiness on feet: Secondary | ICD-10-CM | POA: Diagnosis present

## 2024-06-27 NOTE — Therapy (Signed)
 OUTPATIENT PHYSICAL THERAPY NEURO TREATMENT NOTE   Patient Name: Anne Hogan MRN: [id number] DOB:1946/04/03, 78 y.o., female Today's Date: 06/27/2024   PCP: Janey Santos, MD  REFERRING PROVIDER:  Jama Manuelita RAMAN, NP  END OF SESSION:  PT End of Session - 06/27/24 1108     Visit Number 4    Number of Visits 7    Date for Recertification  07/17/24    Authorization Type [employer]    PT Start Time 1107    PT Stop Time 1146    PT Time Calculation (min) 39 min    Activity Tolerance Patient tolerated treatment well    Behavior During Therapy WFL for tasks assessed/performed           Past Medical History:  Diagnosis Date   ADD (attention deficit disorder)    Arthritis    right knee (04/19/2013)   Asthma 04/03/2019   CHF (congestive heart failure) (HCC)    Chronic diastolic congestive heart failure (HCC)    Claustrophobia    Depression    Dyspnea    Dysrhythmia    Fracture of right tibial plateau 05/2016   GERD (gastroesophageal reflux disease)    Heart murmur    Incidental pulmonary nodule, greater than or equal to 8mm 06/06/2019   Needs f/u CT in 3 months   Migraine    bad when I was in 5th or 6th grade; I can usually ward them off by resting my head now (04/19/2013)   MVP (mitral valve prolapse)    S/P minimally-invasive mitral valve repair 08/03/2019   Complex valvuloplasty including artificial Gore-tex neochords x12 and Sorin Memo 4D ring annuloplasty, size 38 mm   S/P patent foramen ovale closure 08/03/2019   Sleep apnea    has not used mouth guard in 5 years no cpap used    Squamous carcinoma ?1999   upper left groin (04/19/2013)   Past Surgical History:  Procedure Laterality Date   CARDIAC CATHETERIZATION  05/26/2019   CHOLECYSTECTOMY  04/18/2013   CHOLECYSTECTOMY N/A 04/18/2013   Procedure: LAPAROSCOPIC CHOLECYSTECTOMY WITH INTRAOPERATIVE CHOLANGIOGRAM;  Surgeon: Lynda Leos, MD;  Location: [facility] OR;  Service: General;  Laterality: N/A;    COLONOSCOPY W/ POLYPECTOMY     EYE SURGERY Bilateral FALL  2016   IOC FOR CATARACTS   FRACTURE SURGERY     INTERCOSTAL NERVE BLOCK Left 11/23/2019   Procedure: Intercostal Nerve Block;  Surgeon: Kerrin Elspeth BROCKS, MD;  Location: [facility] OR;  Service: Thoracic;  Laterality: Left;   JOINT REPLACEMENT     KNEE ARTHROSCOPY Right 12/2007   meniscus repair (04/19/2013)   LYMPH NODE DISSECTION Left 11/23/2019   Procedure: Lymph Node Dissection;  Surgeon: Kerrin Elspeth BROCKS, MD;  Location: [facility] OR;  Service: Thoracic;  Laterality: Left;   MITRAL VALVE REPAIR Right 08/03/2019   Procedure: MINIMALLY INVASIVE MITRAL VALVE REPAIR (MVR) using Memo 4D 38 MM Mitral Valve.;  Surgeon: Dusty Sudie DEL, MD;  Location: [facility] OR;  Service: Open Heart Surgery;  Laterality: Right;   REPAIR OF PATENT FORAMEN OVALE N/A 08/03/2019   Procedure: CLOSURE OF PATENT FORAMEN OVALE;  Surgeon: Dusty Sudie DEL, MD;  Location: [facility] OR;  Service: Open Heart Surgery;  Laterality: N/A;   RETINAL TEAR REPAIR CRYOTHERAPY Left ~ 1997   RIGHT/LEFT HEART CATH AND CORONARY ANGIOGRAPHY N/A 05/26/2019   Procedure: RIGHT/LEFT HEART CATH AND CORONARY ANGIOGRAPHY;  Surgeon: Dann Candyce RAMAN, MD;  Location: [facility] INVASIVE CV LAB;  Service: Cardiovascular;  Laterality: N/A;  TEE WITHOUT CARDIOVERSION N/A 05/26/2019   Procedure: TRANSESOPHAGEAL ECHOCARDIOGRAM (TEE);  Surgeon: Delford Maude BROCKS, MD;  Location: [facility] ENDOSCOPY;  Service: Cardiovascular;  Laterality: N/A;   TEE WITHOUT CARDIOVERSION N/A 08/03/2019   Procedure: TRANSESOPHAGEAL ECHOCARDIOGRAM (TEE);  Surgeon: Dusty Sudie DEL, MD;  Location: [facility] OR;  Service: Open Heart Surgery;  Laterality: N/A;   TONSILLECTOMY  ~ 1952   TOTAL KNEE ARTHROPLASTY Right 09/21/2016   Procedure: RIGHT TOTAL KNEE ARTHROPLASTY;  Surgeon: Dempsey Moan, MD;  Location: [facility] ORS;  Service: Orthopedics;  Laterality: Right;  requests with abductor block   VARICOSE VEIN SURGERY Right 2000's   VASCULAR SURGERY     WRIST  SURGERY Right 1975   had a knot taken off (04/19/2013)   Patient Active Problem List   Diagnosis Date Noted   TIA due to embolism (HCC) 05/17/2024   Mild sleep apnea 05/17/2024   TIA (transient ischemic attack) 05/09/2024   CKD stage 3a, GFR 45-59 ml/min (HCC) 05/09/2024   Morning headache 06/28/2023   Pulsatile tinnitus, left ear 06/28/2023   Snoring 06/28/2023   History of sleep apnea 06/28/2023   Adenocarcinoma, lung, left (HCC) 12/25/2019   S/P lobectomy of lung 11/23/2019   S/P minimally-invasive mitral valve repair 08/03/2019   S/P patent foramen ovale closure 08/03/2019   Chronic diastolic congestive heart failure (HCC)    Tricuspid regurgitation    Incidental pulmonary nodule, greater than or equal to 8mm 06/06/2019   Asthma 04/03/2019   OA (osteoarthritis) of knee 09/21/2016   Mitral valve prolapse 08/25/2016   Severe mitral regurgitation 08/25/2016    ONSET DATE: 05/09/24  REFERRING DIAG: G45.9 (ICD-10-CM) - Transient cerebral ischemic attack, unspecified  THERAPY DIAG:  Unsteadiness on feet  Rationale for Evaluation and Treatment: Rehabilitation  SUBJECTIVE:                                                                                                                                                                                             SUBJECTIVE STATEMENT: The foot and ankle aren't too bad.    Pt accompanied by: self  PERTINENT HISTORY: ADD, asthma, CHF, depression, hx R tibial plateau fx, migraine, R TKA  PAIN:  Are you having pain? No and follow up with MD for foot 06/08/2024  PRECAUTIONS: Fall*Heart monitor x 8 days starting 06/07/2024-avoid sweating and exertion  RED FLAGS: None   WEIGHT BEARING RESTRICTIONS: No  FALLS: Has patient fallen in last 6 months? No  LIVING ENVIRONMENT: Lives with: lives alone Lives in: House/apartment Stairs: bedroom on 1st level; 2-4 steps to enter without railing Has following equipment at home: Single  point  cane and Walker - 2 wheeled  PLOF: Independent, Vocation/Vocational requirements: retired, and Leisure: painting, hanging out with her dogs  PATIENT GOALS: pt reports no goals  OBJECTIVE:   TODAY'S TREATMENT: 06/27/24 Activity Comments  Pt education Discussion of modifiable and non-mod risk factors for CV health. Education regarding cardiovacular exercise and intensity -education on AD for maintaining ankle NWB if she has surgery upcoming  HEP review Performed with instructions for reference  M-CTSIB Condition 1: normal Condition 2: mild Condition 3: normal-mild Condition 4: mild  NU-step intervals x 8 min 2 min warm-up 30 sec speed; 60 sec recovery                  PATIENT EDUCATION: Education details: Fall prevention education, HEP initiated Person educated: Patient Education method: Programmer, Multimedia, Facilities Manager, and Handouts Education comprehension: verbalized understanding, returned demonstration, and needs further education  Access Code: [email or web address] URL: [email or web address] Date: 06/07/2024 Prepared by: [facility] - Outpatient  Rehab - [facility]  Exercises - Corner Balance Feet Together: Eyes Open With Head Turns  - 1 x daily - 5 x weekly - 2 sets - 5-10 reps - Corner Balance Feet Together: Eyes Closed With Head Turns  - 1 x daily - 5 x weekly - 2 sets - 5-10 reps - Corner Balance Feet Apart: Eyes Closed With Head Turns  - 1 x daily - 5 x weekly - 2 sets - 5-10 reps - Long Sitting Plantar Fascia Stretch with Towel  - 1 x daily - 7 x weekly - 3 sets - 40-60 sec hold ------------------------------------------------------------- Note: Objective measures were completed at Evaluation unless otherwise noted.  DIAGNOSTIC FINDINGS: 05/09/24 brain MRI: No acute intracranial abnormality. 2. Multifocal T2 hyperintense white matter signal, most consistent with chronic small vessel disease.  COGNITION: Overall cognitive status: Within functional  limits for tasks assessed   SENSATION: Pt reports chronic N/ in R great toe  COORDINATION: Alternating pronation/supination: WNL B Alternating toe tap: WNL B Finger to nose: WNL B    MUSCLE TONE: WNL B LEs   POSTURE: elevated shoulders, FHP  LOWER EXTREMITY ROM:     Active  Right Eval Left Eval  Hip flexion    Hip extension    Hip abduction    Hip adduction    Hip internal rotation    Hip external rotation    Knee flexion    Knee extension    Ankle dorsiflexion 6 6  Ankle plantarflexion    Ankle inversion    Ankle eversion     (Blank rows = not tested)  LOWER EXTREMITY MMT:    MMT Right Eval Left Eval  Hip flexion 4+ 4+  Hip extension    Hip abduction 4+ 4+  Hip adduction 4+ 4+  Hip internal rotation    Hip external rotation    Knee flexion 4 4  Knee extension 5 5  Ankle dorsiflexion 4+ 4+  Ankle plantarflexion 4+ 4+  Ankle inversion    Ankle eversion    (Blank rows = not tested)  GAIT: Findings: Assistive device utilized:None, Level of assistance: Modified independence and SBA, and Comments: Antalgia on R LE, imbalance with turns   FUNCTIONAL TESTS:      M-CTSIB  Condition 1: Firm Surface, EO 30 Sec, Mild Sway  Condition 2: Firm Surface, EC 30 Sec, Mild and Moderate Sway  Condition 3: Foam Surface, EO 30 Sec, Mild and Moderate Sway  Condition 4: Foam Surface, EC 30 Sec, Moderate Sway  TREATMENT DATE: 05/29/24    PATIENT EDUCATION: Education details: prognosis, POC, edu on exam findings and how they relate to functional impairments  Person educated: Patient Education method: Explanation Education comprehension: verbalized understanding  HOME EXERCISE PROGRAM: Not yet initiated   GOALS: Goals reviewed with patient? Yes  SHORT TERM GOALS: Target date: 06/19/2024  Patient to be independent with initial  HEP. Baseline: HEP initiated Goal status: IN PROGRESS    LONG TERM GOALS: Target date: 07/17/2024  Patient to be independent with advanced HEP. Baseline: Not yet initiated  Goal status: IN PROGRESS  Patient to report plans to participate in home or community exercise regimen to maintain fitness.  Baseline: not participating Goal status: IN PROGRESS  Patient to score at least 23/30 on FGA in order to decrease risk of falls.  Baseline: 19 Goal status: IN PROGRESS  Patient to demonstrate mild sway with MCTSIB condition 4 to improve stability on uneven surfaces.  Baseline: moderate sway Goal status: IN PROGRESS  Patient to demonstrate good stability with turns and head turns. Baseline: consistent imbalance with turns and L head turns while walking  Goal status: IN PROGRESS  ASSESSMENT:  CLINICAL IMPRESSION: Reports no new issues and has largely resumed typical activity level and notes slight balance impairment but again feels like it may be more related to right ankle/foot issue.  Pt with questions regarding mobility devices if she was to have ankle surgery that required NWB status thereafter, exhibited several different devices for ankle NWB status for functional mobility. Pt education regarding exercise and cardiovascular health and discussed modifiable and non-modifiable risk factors and discussed exercise intensity/duration to guide physical activity.  Verbalized understanding and reports intent to start going to gym for more routine exercise and provided with handout for interval exercise on aerobic machines.  OBJECTIVE IMPAIRMENTS: Abnormal gait, decreased activity tolerance, decreased balance, difficulty walking, decreased ROM, decreased strength, impaired flexibility, postural dysfunction, and pain.   ACTIVITY LIMITATIONS: carrying, lifting, bending, sitting, standing, squatting, sleeping, stairs, transfers, bathing, toileting, dressing, reach over head, hygiene/grooming, and  locomotion level  PARTICIPATION LIMITATIONS: meal prep, cleaning, laundry, driving, shopping, community activity, and church  PERSONAL FACTORS: Age, Fitness, Past/current experiences, Time since onset of injury/illness/exacerbation, and 3+ comorbidities: ADD, asthma, CHF, depression, hx R tibial plateau fx, migraine, R TKA are also affecting patient's functional outcome.   REHAB POTENTIAL: Good  CLINICAL DECISION MAKING: Evolving/moderate complexity  EVALUATION COMPLEXITY: Moderate  PLAN:  PT FREQUENCY: 1x/week  PT DURATION: 6 weeks  PLANNED INTERVENTIONS: [id number]- PT Re-evaluation, 97110-Therapeutic exercises, 97530- Therapeutic activity, 97112- Neuromuscular re-education, 97535- Self Care, [id number]- Manual therapy, [phone]- Gait training, [phone]- Canalith repositioning, Patient/Family education, Balance training, Stair training, Taping, Vestibular training, DME instructions, Cryotherapy, and Moist heat  PLAN FOR NEXT SESSION: vestibular assessment to see if this system is contributing to imbalance, review HEP for balance- turns, head turns, tandem; discuss fitness options keeping R foot pain in mind (*Ask about foot MD appt)   12:34 PM, 06/27/24 M. Kelly Reuven Braver, PT, DPT Physical Therapist- [place] Office Number: [phone]

## 2024-06-29 NOTE — Therapy (Incomplete)
 OUTPATIENT PHYSICAL THERAPY NEURO TREATMENT NOTE   Patient Name: Anne Hogan MRN: [id number] DOB:Dec 10, 1945, 78 y.o., female Today's Date: 06/29/2024   PCP: Janey Santos, MD  REFERRING PROVIDER:  Jama Manuelita RAMAN, NP  END OF SESSION:     Past Medical History:  Diagnosis Date   ADD (attention deficit disorder)    Arthritis    right knee (04/19/2013)   Asthma 04/03/2019   CHF (congestive heart failure) (HCC)    Chronic diastolic congestive heart failure (HCC)    Claustrophobia    Depression    Dyspnea    Dysrhythmia    Fracture of right tibial plateau 05/2016   GERD (gastroesophageal reflux disease)    Heart murmur    Incidental pulmonary nodule, greater than or equal to 8mm 06/06/2019   Needs f/u CT in 3 months   Migraine    bad when I was in 5th or 6th grade; I can usually ward them off by resting my head now (04/19/2013)   MVP (mitral valve prolapse)    S/P minimally-invasive mitral valve repair 08/03/2019   Complex valvuloplasty including artificial Gore-tex neochords x12 and Sorin Memo 4D ring annuloplasty, size 38 mm   S/P patent foramen ovale closure 08/03/2019   Sleep apnea    has not used mouth guard in 5 years no cpap used    Squamous carcinoma ?1999   upper left groin (04/19/2013)   Past Surgical History:  Procedure Laterality Date   CARDIAC CATHETERIZATION  05/26/2019   CHOLECYSTECTOMY  04/18/2013   CHOLECYSTECTOMY N/A 04/18/2013   Procedure: LAPAROSCOPIC CHOLECYSTECTOMY WITH INTRAOPERATIVE CHOLANGIOGRAM;  Surgeon: Lynda Leos, MD;  Location: [facility] OR;  Service: General;  Laterality: N/A;   COLONOSCOPY W/ POLYPECTOMY     EYE SURGERY Bilateral FALL  2016   IOC FOR CATARACTS   FRACTURE SURGERY     INTERCOSTAL NERVE BLOCK Left 11/23/2019   Procedure: Intercostal Nerve Block;  Surgeon: Kerrin Elspeth BROCKS, MD;  Location: [facility] OR;  Service: Thoracic;  Laterality: Left;   JOINT REPLACEMENT     KNEE ARTHROSCOPY Right 12/2007   meniscus repair  (04/19/2013)   LYMPH NODE DISSECTION Left 11/23/2019   Procedure: Lymph Node Dissection;  Surgeon: Kerrin Elspeth BROCKS, MD;  Location: [facility] OR;  Service: Thoracic;  Laterality: Left;   MITRAL VALVE REPAIR Right 08/03/2019   Procedure: MINIMALLY INVASIVE MITRAL VALVE REPAIR (MVR) using Memo 4D 38 MM Mitral Valve.;  Surgeon: Dusty Sudie DEL, MD;  Location: [facility] OR;  Service: Open Heart Surgery;  Laterality: Right;   REPAIR OF PATENT FORAMEN OVALE N/A 08/03/2019   Procedure: CLOSURE OF PATENT FORAMEN OVALE;  Surgeon: Dusty Sudie DEL, MD;  Location: [facility] OR;  Service: Open Heart Surgery;  Laterality: N/A;   RETINAL TEAR REPAIR CRYOTHERAPY Left ~ 1997   RIGHT/LEFT HEART CATH AND CORONARY ANGIOGRAPHY N/A 05/26/2019   Procedure: RIGHT/LEFT HEART CATH AND CORONARY ANGIOGRAPHY;  Surgeon: Dann Candyce RAMAN, MD;  Location: [facility] INVASIVE CV LAB;  Service: Cardiovascular;  Laterality: N/A;   TEE WITHOUT CARDIOVERSION N/A 05/26/2019   Procedure: TRANSESOPHAGEAL ECHOCARDIOGRAM (TEE);  Surgeon: Delford Maude BROCKS, MD;  Location: [facility] ENDOSCOPY;  Service: Cardiovascular;  Laterality: N/A;   TEE WITHOUT CARDIOVERSION N/A 08/03/2019   Procedure: TRANSESOPHAGEAL ECHOCARDIOGRAM (TEE);  Surgeon: Dusty Sudie DEL, MD;  Location: [facility] OR;  Service: Open Heart Surgery;  Laterality: N/A;   TONSILLECTOMY  ~ 1952   TOTAL KNEE ARTHROPLASTY Right 09/21/2016   Procedure: RIGHT TOTAL KNEE ARTHROPLASTY;  Surgeon: Dempsey Moan, MD;  Location:  [facility] ORS;  Service: Orthopedics;  Laterality: Right;  requests with abductor block   VARICOSE VEIN SURGERY Right 2000's   VASCULAR SURGERY     WRIST SURGERY Right 1975   had a knot taken off (04/19/2013)   Patient Active Problem List   Diagnosis Date Noted   TIA due to embolism (HCC) 05/17/2024   Mild sleep apnea 05/17/2024   TIA (transient ischemic attack) 05/09/2024   CKD stage 3a, GFR 45-59 ml/min (HCC) 05/09/2024   Morning headache 06/28/2023   Pulsatile tinnitus, left ear 06/28/2023    Snoring 06/28/2023   History of sleep apnea 06/28/2023   Adenocarcinoma, lung, left (HCC) 12/25/2019   S/P lobectomy of lung 11/23/2019   S/P minimally-invasive mitral valve repair 08/03/2019   S/P patent foramen ovale closure 08/03/2019   Chronic diastolic congestive heart failure (HCC)    Tricuspid regurgitation    Incidental pulmonary nodule, greater than or equal to 8mm 06/06/2019   Asthma 04/03/2019   OA (osteoarthritis) of knee 09/21/2016   Mitral valve prolapse 08/25/2016   Severe mitral regurgitation 08/25/2016    ONSET DATE: 05/09/24  REFERRING DIAG: G45.9 (ICD-10-CM) - Transient cerebral ischemic attack, unspecified  THERAPY DIAG:  No diagnosis found.  Rationale for Evaluation and Treatment: Rehabilitation  SUBJECTIVE:                                                                                                                                                                                             SUBJECTIVE STATEMENT: The foot and ankle aren't too bad.    Pt accompanied by: self  PERTINENT HISTORY: ADD, asthma, CHF, depression, hx R tibial plateau fx, migraine, R TKA  PAIN:  Are you having pain? No and follow up with MD for foot 06/08/2024  PRECAUTIONS: Fall*Heart monitor x 8 days starting 06/07/2024-avoid sweating and exertion  RED FLAGS: None   WEIGHT BEARING RESTRICTIONS: No  FALLS: Has patient fallen in last 6 months? No  LIVING ENVIRONMENT: Lives with: lives alone Lives in: House/apartment Stairs: bedroom on 1st level; 2-4 steps to enter without railing Has following equipment at home: Single point cane and Environmental Consultant - 2 wheeled  PLOF: Independent, Vocation/Vocational requirements: retired, and Leisure: painting, hanging out with her dogs  PATIENT GOALS: pt reports no goals   OBJECTIVE:      TODAY'S TREATMENT: 07/04/24  Activity Comments                        TODAY'S TREATMENT: 06/27/24 Activity Comments  Pt education  Discussion of modifiable and non-mod risk factors for CV health. Education regarding cardiovacular  exercise and intensity -education on AD for maintaining ankle NWB if she has surgery upcoming  HEP review Performed with instructions for reference  M-CTSIB Condition 1: normal Condition 2: mild Condition 3: normal-mild Condition 4: mild  NU-step intervals x 8 min 2 min warm-up 30 sec speed; 60 sec recovery                  PATIENT EDUCATION: Education details: Fall prevention education, HEP initiated Person educated: Patient Education method: Programmer, Multimedia, Facilities Manager, and Handouts Education comprehension: verbalized understanding, returned demonstration, and needs further education  Access Code: [email or web address] URL: [email or web address] Date: 06/07/2024 Prepared by: [facility] Outpatient  Rehab - [facility]  Exercises - Corner Balance Feet Together: Eyes Open With Head Turns  - 1 x daily - 5 x weekly - 2 sets - 5-10 reps - Corner Balance Feet Together: Eyes Closed With Head Turns  - 1 x daily - 5 x weekly - 2 sets - 5-10 reps - Corner Balance Feet Apart: Eyes Closed With Head Turns  - 1 x daily - 5 x weekly - 2 sets - 5-10 reps - Long Sitting Plantar Fascia Stretch with Towel  - 1 x daily - 7 x weekly - 3 sets - 40-60 sec hold ------------------------------------------------------------- Note: Objective measures were completed at Evaluation unless otherwise noted.  DIAGNOSTIC FINDINGS: 05/09/24 brain MRI: No acute intracranial abnormality. 2. Multifocal T2 hyperintense white matter signal, most consistent with chronic small vessel disease.  COGNITION: Overall cognitive status: Within functional limits for tasks assessed   SENSATION: Pt reports chronic N/ in R great toe  COORDINATION: Alternating pronation/supination: WNL B Alternating toe tap: WNL B Finger to nose: WNL B    MUSCLE TONE: WNL B LEs   POSTURE: elevated shoulders, FHP  LOWER  EXTREMITY ROM:     Active  Right Eval Left Eval  Hip flexion    Hip extension    Hip abduction    Hip adduction    Hip internal rotation    Hip external rotation    Knee flexion    Knee extension    Ankle dorsiflexion 6 6  Ankle plantarflexion    Ankle inversion    Ankle eversion     (Blank rows = not tested)  LOWER EXTREMITY MMT:    MMT Right Eval Left Eval  Hip flexion 4+ 4+  Hip extension    Hip abduction 4+ 4+  Hip adduction 4+ 4+  Hip internal rotation    Hip external rotation    Knee flexion 4 4  Knee extension 5 5  Ankle dorsiflexion 4+ 4+  Ankle plantarflexion 4+ 4+  Ankle inversion    Ankle eversion    (Blank rows = not tested)  GAIT: Findings: Assistive device utilized:None, Level of assistance: Modified independence and SBA, and Comments: Antalgia on R LE, imbalance with turns   FUNCTIONAL TESTS:      M-CTSIB  Condition 1: Firm Surface, EO 30 Sec, Mild Sway  Condition 2: Firm Surface, EC 30 Sec, Mild and Moderate Sway  Condition 3: Foam Surface, EO 30 Sec, Mild and Moderate Sway  Condition 4: Foam Surface, EC 30 Sec, Moderate Sway  TREATMENT DATE: 05/29/24    PATIENT EDUCATION: Education details: prognosis, POC, edu on exam findings and how they relate to functional impairments  Person educated: Patient Education method: Explanation Education comprehension: verbalized understanding  HOME EXERCISE PROGRAM: Not yet initiated   GOALS: Goals reviewed with patient? Yes  SHORT TERM GOALS: Target date: 06/19/2024  Patient to be independent with initial HEP. Baseline: HEP initiated Goal status: IN PROGRESS    LONG TERM GOALS: Target date: 07/17/2024  Patient to be independent with advanced HEP. Baseline: Not yet initiated  Goal status: IN PROGRESS  Patient to report plans to participate in home or community  exercise regimen to maintain fitness.  Baseline: not participating Goal status: IN PROGRESS  Patient to score at least 23/30 on FGA in order to decrease risk of falls.  Baseline: 19 Goal status: IN PROGRESS  Patient to demonstrate mild sway with MCTSIB condition 4 to improve stability on uneven surfaces.  Baseline: moderate sway Goal status: IN PROGRESS  Patient to demonstrate good stability with turns and head turns. Baseline: consistent imbalance with turns and L head turns while walking  Goal status: IN PROGRESS  ASSESSMENT:  CLINICAL IMPRESSION: Reports no new issues and has largely resumed typical activity level and notes slight balance impairment but again feels like it may be more related to right ankle/foot issue.  Pt with questions regarding mobility devices if she was to have ankle surgery that required NWB status thereafter, exhibited several different devices for ankle NWB status for functional mobility. Pt education regarding exercise and cardiovascular health and discussed modifiable and non-modifiable risk factors and discussed exercise intensity/duration to guide physical activity.  Verbalized understanding and reports intent to start going to gym for more routine exercise and provided with handout for interval exercise on aerobic machines.  OBJECTIVE IMPAIRMENTS: Abnormal gait, decreased activity tolerance, decreased balance, difficulty walking, decreased ROM, decreased strength, impaired flexibility, postural dysfunction, and pain.   ACTIVITY LIMITATIONS: carrying, lifting, bending, sitting, standing, squatting, sleeping, stairs, transfers, bathing, toileting, dressing, reach over head, hygiene/grooming, and locomotion level  PARTICIPATION LIMITATIONS: meal prep, cleaning, laundry, driving, shopping, community activity, and church  PERSONAL FACTORS: Age, Fitness, Past/current experiences, Time since onset of injury/illness/exacerbation, and 3+ comorbidities: ADD, asthma,  CHF, depression, hx R tibial plateau fx, migraine, R TKA are also affecting patient's functional outcome.   REHAB POTENTIAL: Good  CLINICAL DECISION MAKING: Evolving/moderate complexity  EVALUATION COMPLEXITY: Moderate  PLAN:  PT FREQUENCY: 1x/week  PT DURATION: 6 weeks  PLANNED INTERVENTIONS: [id number]- PT Re-evaluation, 97110-Therapeutic exercises, 97530- Therapeutic activity, 97112- Neuromuscular re-education, 97535- Self Care, [id number]- Manual therapy, [phone]- Gait training, [phone]- Canalith repositioning, Patient/Family education, Balance training, Stair training, Taping, Vestibular training, DME instructions, Cryotherapy, and Moist heat  PLAN FOR NEXT SESSION: vestibular assessment to see if this system is contributing to imbalance, review HEP for balance- turns, head turns, tandem; discuss fitness options keeping R foot pain in mind (*Ask about foot MD appt)

## 2024-07-04 ENCOUNTER — Ambulatory Visit: Admitting: Physical Therapy

## 2024-07-05 ENCOUNTER — Ambulatory Visit

## 2024-07-07 ENCOUNTER — Telehealth: Payer: Self-pay | Admitting: Neurology

## 2024-07-10 ENCOUNTER — Telehealth: Payer: Self-pay

## 2024-07-10 NOTE — Telephone Encounter (Signed)
 Refill request was sent to Dr Margaret MD on call this am

## 2024-07-10 NOTE — Telephone Encounter (Signed)
 I called the patient and relayed message to her, per Dr Rosemarie and Dr Margaret, to continue Eliquis , but no Plavix . Dr Rosemarie says she does not need to be on both if she is on Eliquis . Patient verbalized understanding and wanted to clarify that she should not resume the Aspirin  81 mg. She was previously on this when she had her mitral valve replacement but then it was switched to Plavix . I told the patient not start it unless she is advised to. She thanked me for the call.

## 2024-07-10 NOTE — Telephone Encounter (Signed)
 Pt called back to follow up on what she should do about medication refill.Pt stated she is out of medication and wanted to know what's next steps, Informed that message has been sent to On call MD  and He will get back to Pt  soon.

## 2024-07-10 NOTE — Telephone Encounter (Signed)
 Pt called appt Scheduled

## 2024-07-10 NOTE — Telephone Encounter (Signed)
 Dr. Rosemarie and Dr. Chalice,   Can you clarify anti-coagulation vs anti-platelet therapy for this patient?  -VRP

## 2024-07-10 NOTE — Telephone Encounter (Signed)
 Called and LVM for pt to call back and schedule appt with NP Harlene HERO.

## 2024-07-10 NOTE — Telephone Encounter (Signed)
 Pt has called to f/u on this refill request, pt states she has not had medication clopidogrel  (PLAVIX ) 75 MG tablet since late last week, please call.

## 2024-07-11 ENCOUNTER — Other Ambulatory Visit: Payer: Self-pay

## 2024-07-11 ENCOUNTER — Encounter (HOSPITAL_BASED_OUTPATIENT_CLINIC_OR_DEPARTMENT_OTHER): Payer: Self-pay | Admitting: Emergency Medicine

## 2024-07-11 ENCOUNTER — Ambulatory Visit: Admitting: Physical Therapy

## 2024-07-11 ENCOUNTER — Emergency Department (HOSPITAL_BASED_OUTPATIENT_CLINIC_OR_DEPARTMENT_OTHER)
Admission: EM | Admit: 2024-07-11 | Discharge: 2024-07-11 | Disposition: A | Discharge status: Home / Self Care | Source: Ambulatory Visit | Attending: Emergency Medicine | Admitting: Emergency Medicine

## 2024-07-11 DIAGNOSIS — S39012A Strain of muscle, fascia and tendon of lower back, initial encounter: Secondary | ICD-10-CM

## 2024-07-11 DIAGNOSIS — R10A2 Flank pain, left side: Secondary | ICD-10-CM | POA: Diagnosis present

## 2024-07-11 LAB — URINALYSIS, W/ REFLEX TO CULTURE (INFECTION SUSPECTED)
Bilirubin Urine: NEGATIVE
Glucose, UA: NEGATIVE mg/dL
Leukocytes,Ua: NEGATIVE
Nitrite: NEGATIVE
Specific Gravity, Urine: 1.02 (ref 1.005–1.030)
pH: 6 (ref 5.0–8.0)

## 2024-07-11 MED ORDER — ACETAMINOPHEN 325 MG PO TABS
650.0000 mg | ORAL_TABLET | Freq: Once | ORAL | Status: AC
  Administered 2024-07-11: 650 mg via ORAL
  Filled 2024-07-11: qty 2

## 2024-07-11 MED ORDER — LIDOCAINE 5 % EX PTCH: 1.0000 | MEDICATED_PATCH | CUTANEOUS | 0 refills | Status: AC

## 2024-07-11 MED ORDER — METHOCARBAMOL 500 MG PO TABS: 500.0000 mg | ORAL_TABLET | Freq: Two times a day (BID) | ORAL | 0 refills | Status: AC

## 2024-07-11 MED ORDER — LIDOCAINE 5 % EX PTCH
1.0000 | MEDICATED_PATCH | CUTANEOUS | Status: DC
  Administered 2024-07-11: 1 via TRANSDERMAL
  Filled 2024-07-11: qty 1

## 2024-07-11 NOTE — ED Triage Notes (Signed)
 Pt caox4 ambulatory c/o L flank pain since yest, denies urinary s/s. Dx with pneumonia Friday.

## 2024-07-11 NOTE — Telephone Encounter (Signed)
 Spoke with patient again. She understands her plan of care.

## 2024-07-11 NOTE — ED Provider Notes (Signed)
 " [place] EMERGENCY DEPARTMENT AT [facility] Provider Note   CSN: [id number] Arrival date & time: 07/11/24  1232     Patient presents with: Flank Pain   Anne Hogan is a 78 y.o. female.   78 year old female presents with left flank x 1 day.  Pain is characterized as sharp.  Patient states the pain is at her lower left flank area and is worse with certain positions.  Denies any urinary symptoms at this time.  No fever or chills.  No radicular symptoms.  Pain better with remaining stills.  Has used Tylenol  with some relief.       Prior to Admission medications  Medication Sig Start Date End Date Taking? Authorizing Provider  acetaminophen  (TYLENOL ) 500 MG tablet Take 500-1,000 mg by mouth every 6 (six) hours as needed for moderate pain (pain).     [provider]  Alum Hydroxide-Mag Carbonate (GAVISCON PO) Take 1 tablet by mouth 4 (four) times daily as needed (acid reflux).    [provider]  apixaban  (ELIQUIS ) 5 MG TABS tablet Take 1 tablet (5 mg total) by mouth 2 (two) times daily. 05/10/24   Briana Elgin LABOR, MD  B Complex-C (SUPER B-COMPLEX + VITAMIN C PO) Take 1 tablet by mouth daily at 6 (six) AM.    [provider]  Berberine Chloride (BERBERINE HCI PO) Take 1 tablet by mouth daily at 6 (six) AM.    [provider]  BIOTIN PO Take 1 tablet by mouth daily at 6 (six) AM.    [provider]  BREO ELLIPTA  100-25 MCG/INH AEPB USE 1 PUFF DAILY AS DIRECTED 02/16/20   Mannam, Praveen, MD  clopidogrel  (PLAVIX ) 75 MG tablet Take 1 tablet (75 mg total) by mouth daily. 05/17/24   Dohmeier, Dedra, MD  doxycycline  (VIBRAMYCIN ) 100 MG capsule Take 1 capsule (100 mg total) one hour prior to any dental procedure 06/19/22   Camnitz, Soyla Lunger, MD  Ginkgo Biloba 40 MG TABS Take 1 tablet by mouth daily.    [provider]  lidocaine  (LIDODERM ) 5 % Place 1 patch onto the skin daily. Remove & Discard patch within 12 hours or as  directed by MD 04/09/24   Simon Lavonia SAILOR, MD  MAGNESIUM  PO Take 1 tablet by mouth daily.    [provider]  montelukast  (SINGULAIR ) 10 MG tablet Take 10 mg by mouth at bedtime.    [provider]  Multiple Vitamins-Minerals (CENTRUM SILVER 50+WOMEN PO) Take 1 tablet by mouth daily.    [provider]  psyllium (REGULOID) 0.52 g capsule Take 3 capsules by mouth daily.    [provider]  rosuvastatin  (CRESTOR ) 20 MG tablet Take 1 tablet (20 mg total) by mouth daily. 05/11/24   Briana Elgin LABOR, MD  VITAMIN D PO Take 1 tablet by mouth See admin instructions. Takes 1 tablet on Sunday and 1 tablet on Thursday    [provider]    Allergies: Penicillins, Codeine, and Morphine  and codeine    Review of Systems  All other systems reviewed and are negative.   Updated Vital Signs BP 114/63 (BP Location: Right Arm)   Pulse 78   Temp 97.9 F (36.6 C) (Oral)   Resp 18   Ht 1.575 m (5' 2)   Wt 71.7 kg   SpO2 97%   BMI 28.90 kg/m   Physical Exam Vitals and nursing note reviewed.  Constitutional:      General: She is not in acute  distress.    Appearance: Normal appearance. She is well-developed. She is not toxic-appearing.  HENT:     Head: Normocephalic and atraumatic.  Eyes:     General: Lids are normal.     Conjunctiva/sclera: Conjunctivae normal.     Pupils: Pupils are equal, round, and reactive to light.  Neck:     Thyroid : No thyroid  mass.     Trachea: No tracheal deviation.  Cardiovascular:     Rate and Rhythm: Normal rate and regular rhythm.     Heart sounds: Normal heart sounds. No murmur heard.    No gallop.  Pulmonary:     Effort: Pulmonary effort is normal. No respiratory distress.     Breath sounds: Normal breath sounds. No stridor. No decreased breath sounds, wheezing, rhonchi or rales.  Abdominal:     General: There is no distension.     Palpations: Abdomen is soft.     Tenderness: There is no abdominal tenderness. There  is no rebound.  Musculoskeletal:        General: No tenderness. Normal range of motion.     Cervical back: Normal range of motion and neck supple.       Back:  Skin:    General: Skin is warm and dry.     Findings: No abrasion or rash.  Neurological:     General: No focal deficit present.     Mental Status: She is alert and oriented to person, place, and time. Mental status is at baseline.     GCS: GCS eye subscore is 4. GCS verbal subscore is 5. GCS motor subscore is 6.     Cranial Nerves: No cranial nerve deficit.     Sensory: No sensory deficit.     Motor: Motor function is intact.     Comments: Strength 5/5 bilateral lower extremities  Psychiatric:        Attention and Perception: Attention normal.        Speech: Speech normal.        Behavior: Behavior normal.     (all labs ordered are listed, but only abnormal results are displayed) Labs Reviewed  URINALYSIS, W/ REFLEX TO CULTURE (INFECTION SUSPECTED)    EKG: None  Radiology: No results found.   Procedures   Medications Ordered in the ED  lidocaine  (LIDODERM ) 5 % 1 patch (has no administration in time range)  acetaminophen  (TYLENOL ) tablet 650 mg (has no administration in time range)                                    Medical Decision Making Risk OTC drugs. Prescription drug management.   Urinalysis negative for infection.  Given lidocaine  patch and feels better.  Suspect musculoskeletal injury and will discharge home on Lidoderm  patches as well as muscle relaxants     Final diagnoses:  None    ED Discharge Orders     None          Dasie Faden, MD 07/11/24 1408  "

## 2024-07-18 ENCOUNTER — Telehealth: Payer: Self-pay | Admitting: *Deleted

## 2024-07-18 DIAGNOSIS — Z8669 Personal history of other diseases of the nervous system and sense organs: Secondary | ICD-10-CM

## 2024-07-18 DIAGNOSIS — I071 Rheumatic tricuspid insufficiency: Secondary | ICD-10-CM

## 2024-07-18 DIAGNOSIS — H93A2 Pulsatile tinnitus, left ear: Secondary | ICD-10-CM

## 2024-07-18 DIAGNOSIS — I5032 Chronic diastolic (congestive) heart failure: Secondary | ICD-10-CM

## 2024-07-18 NOTE — Telephone Encounter (Signed)
 Completed script/referral to [facility].  To Dr. Chalice to sign.

## 2024-07-18 NOTE — Therapy (Signed)
 " OUTPATIENT PHYSICAL THERAPY NEURO TREATMENT NOTE   Patient Name: Anne Hogan MRN: [id number] DOB:[birth year], 78 y.o., female Today's Date: 07/18/2024   PCP: Janey Santos, MD  REFERRING PROVIDER:  Jama Manuelita RAMAN, NP  END OF SESSION:     Past Medical History:  Diagnosis Date   ADD (attention deficit disorder)    Arthritis    right knee (04/19/2013)   Asthma 04/03/2019   CHF (congestive heart failure) (HCC)    Chronic diastolic congestive heart failure (HCC)    Claustrophobia    Depression    Dyspnea    Dysrhythmia    Fracture of right tibial plateau 05/2016   GERD (gastroesophageal reflux disease)    Heart murmur    Incidental pulmonary nodule, greater than or equal to 8mm 06/06/2019   Needs f/u CT in 3 months   Migraine    bad when I was in 5th or 6th grade; I can usually ward them off by resting my head now (04/19/2013)   MVP (mitral valve prolapse)    S/P minimally-invasive mitral valve repair 08/03/2019   Complex valvuloplasty including artificial Gore-tex neochords x12 and Sorin Memo 4D ring annuloplasty, size 38 mm   S/P patent foramen ovale closure 08/03/2019   Sleep apnea    has not used mouth guard in 5 years no cpap used    Squamous carcinoma ?1999   upper left groin (04/19/2013)   Past Surgical History:  Procedure Laterality Date   CARDIAC CATHETERIZATION  05/26/2019   CHOLECYSTECTOMY  04/18/2013   CHOLECYSTECTOMY N/A 04/18/2013   Procedure: LAPAROSCOPIC CHOLECYSTECTOMY WITH INTRAOPERATIVE CHOLANGIOGRAM;  Surgeon: Lynda Leos, MD;  Location: [facility] OR;  Service: General;  Laterality: N/A;   COLONOSCOPY W/ POLYPECTOMY     EYE SURGERY Bilateral FALL  2016   IOC FOR CATARACTS   FRACTURE SURGERY     INTERCOSTAL NERVE BLOCK Left 11/23/2019   Procedure: Intercostal Nerve Block;  Surgeon: Kerrin Elspeth BROCKS, MD;  Location: [facility] OR;  Service: Thoracic;  Laterality: Left;   JOINT REPLACEMENT     KNEE ARTHROSCOPY Right 12/2007   meniscus repair  (04/19/2013)   LYMPH NODE DISSECTION Left 11/23/2019   Procedure: Lymph Node Dissection;  Surgeon: Kerrin Elspeth BROCKS, MD;  Location: [facility] OR;  Service: Thoracic;  Laterality: Left;   MITRAL VALVE REPAIR Right 08/03/2019   Procedure: MINIMALLY INVASIVE MITRAL VALVE REPAIR (MVR) using Memo 4D 38 MM Mitral Valve.;  Surgeon: Dusty Sudie DEL, MD;  Location: [facility] OR;  Service: Open Heart Surgery;  Laterality: Right;   REPAIR OF PATENT FORAMEN OVALE N/A 08/03/2019   Procedure: CLOSURE OF PATENT FORAMEN OVALE;  Surgeon: Dusty Sudie DEL, MD;  Location: [facility] OR;  Service: Open Heart Surgery;  Laterality: N/A;   RETINAL TEAR REPAIR CRYOTHERAPY Left ~ 1997   RIGHT/LEFT HEART CATH AND CORONARY ANGIOGRAPHY N/A 05/26/2019   Procedure: RIGHT/LEFT HEART CATH AND CORONARY ANGIOGRAPHY;  Surgeon: Dann Candyce RAMAN, MD;  Location: [facility] INVASIVE CV LAB;  Service: Cardiovascular;  Laterality: N/A;   TEE WITHOUT CARDIOVERSION N/A 05/26/2019   Procedure: TRANSESOPHAGEAL ECHOCARDIOGRAM (TEE);  Surgeon: Delford Maude BROCKS, MD;  Location: [facility] ENDOSCOPY;  Service: Cardiovascular;  Laterality: N/A;   TEE WITHOUT CARDIOVERSION N/A 08/03/2019   Procedure: TRANSESOPHAGEAL ECHOCARDIOGRAM (TEE);  Surgeon: Dusty Sudie DEL, MD;  Location: [facility] OR;  Service: Open Heart Surgery;  Laterality: N/A;   TONSILLECTOMY  ~ 1952   TOTAL KNEE ARTHROPLASTY Right 09/21/2016   Procedure: RIGHT TOTAL KNEE ARTHROPLASTY;  Surgeon: Dempsey Moan, MD;  Location: [facility] ORS;  Service: Orthopedics;  Laterality: Right;  requests with abductor block   VARICOSE VEIN SURGERY Right 2000's   VASCULAR SURGERY     WRIST SURGERY Right 1975   had a knot taken off (04/19/2013)   Patient Active Problem List   Diagnosis Date Noted   TIA due to embolism (HCC) 05/17/2024   Mild sleep apnea 05/17/2024   TIA (transient ischemic attack) 05/09/2024   CKD stage 3a, GFR 45-59 ml/min (HCC) 05/09/2024   Morning headache 06/28/2023   Pulsatile tinnitus, left ear 06/28/2023    Snoring 06/28/2023   History of sleep apnea 06/28/2023   Adenocarcinoma, lung, left (HCC) 12/25/2019   S/P lobectomy of lung 11/23/2019   S/P minimally-invasive mitral valve repair 08/03/2019   S/P patent foramen ovale closure 08/03/2019   Chronic diastolic congestive heart failure (HCC)    Tricuspid regurgitation    Incidental pulmonary nodule, greater than or equal to 8mm 06/06/2019   Asthma 04/03/2019   OA (osteoarthritis) of knee 09/21/2016   Mitral valve prolapse 08/25/2016   Severe mitral regurgitation 08/25/2016    ONSET DATE: 05/09/24  REFERRING DIAG: G45.9 (ICD-10-CM) - Transient cerebral ischemic attack, unspecified  THERAPY DIAG:  No diagnosis found.  Rationale for Evaluation and Treatment: Rehabilitation  SUBJECTIVE:                                                                                                                                                                                             SUBJECTIVE STATEMENT: The foot and ankle aren't too bad.    Pt accompanied by: self  PERTINENT HISTORY: ADD, asthma, CHF, depression, hx R tibial plateau fx, migraine, R TKA  PAIN:  Are you having pain? No and follow up with MD for foot 06/08/2024  PRECAUTIONS: Fall*Heart monitor x 8 days starting 06/07/2024-avoid sweating and exertion  RED FLAGS: None   WEIGHT BEARING RESTRICTIONS: No  FALLS: Has patient fallen in last 6 months? No  LIVING ENVIRONMENT: Lives with: lives alone Lives in: House/apartment Stairs: bedroom on 1st level; 2-4 steps to enter without railing Has following equipment at home: Single point cane and Environmental Consultant - 2 wheeled  PLOF: Independent, Vocation/Vocational requirements: retired, and Leisure: painting, hanging out with her dogs  PATIENT GOALS: pt reports no goals   OBJECTIVE:      TODAY'S TREATMENT: 07/19/24  Activity Comments                        TODAY'S TREATMENT: 06/27/24 Activity Comments  Pt education  Discussion of modifiable and non-mod risk factors for CV health. Education regarding  cardiovacular exercise and intensity -education on AD for maintaining ankle NWB if she has surgery upcoming  HEP review Performed with instructions for reference  M-CTSIB Condition 1: normal Condition 2: mild Condition 3: normal-mild Condition 4: mild  NU-step intervals x 8 min 2 min warm-up 30 sec speed; 60 sec recovery                  PATIENT EDUCATION: Education details: Fall prevention education, HEP initiated Person educated: Patient Education method: Programmer, Multimedia, Facilities Manager, and Handouts Education comprehension: verbalized understanding, returned demonstration, and needs further education  Access Code: [email or web address] URL: [email or web address] Date: 06/07/2024 Prepared by: [facility] - Outpatient  Rehab - [facility]  Exercises - Corner Balance Feet Together: Eyes Open With Head Turns  - 1 x daily - 5 x weekly - 2 sets - 5-10 reps - Corner Balance Feet Together: Eyes Closed With Head Turns  - 1 x daily - 5 x weekly - 2 sets - 5-10 reps - Corner Balance Feet Apart: Eyes Closed With Head Turns  - 1 x daily - 5 x weekly - 2 sets - 5-10 reps - Long Sitting Plantar Fascia Stretch with Towel  - 1 x daily - 7 x weekly - 3 sets - 40-60 sec hold ------------------------------------------------------------- Note: Objective measures were completed at Evaluation unless otherwise noted.  DIAGNOSTIC FINDINGS: 05/09/24 brain MRI: No acute intracranial abnormality. 2. Multifocal T2 hyperintense white matter signal, most consistent with chronic small vessel disease.  COGNITION: Overall cognitive status: Within functional limits for tasks assessed   SENSATION: Pt reports chronic N/ in R great toe  COORDINATION: Alternating pronation/supination: WNL B Alternating toe tap: WNL B Finger to nose: WNL B    MUSCLE TONE: WNL B LEs   POSTURE: elevated shoulders, FHP  LOWER  EXTREMITY ROM:     Active  Right Eval Left Eval  Hip flexion    Hip extension    Hip abduction    Hip adduction    Hip internal rotation    Hip external rotation    Knee flexion    Knee extension    Ankle dorsiflexion 6 6  Ankle plantarflexion    Ankle inversion    Ankle eversion     (Blank rows = not tested)  LOWER EXTREMITY MMT:    MMT Right Eval Left Eval  Hip flexion 4+ 4+  Hip extension    Hip abduction 4+ 4+  Hip adduction 4+ 4+  Hip internal rotation    Hip external rotation    Knee flexion 4 4  Knee extension 5 5  Ankle dorsiflexion 4+ 4+  Ankle plantarflexion 4+ 4+  Ankle inversion    Ankle eversion    (Blank rows = not tested)  GAIT: Findings: Assistive device utilized:None, Level of assistance: Modified independence and SBA, and Comments: Antalgia on R LE, imbalance with turns   FUNCTIONAL TESTS:      M-CTSIB  Condition 1: Firm Surface, EO 30 Sec, Mild Sway  Condition 2: Firm Surface, EC 30 Sec, Mild and Moderate Sway  Condition 3: Foam Surface, EO 30 Sec, Mild and Moderate Sway  Condition 4: Foam Surface, EC 30 Sec, Moderate Sway  TREATMENT DATE: 05/29/24    PATIENT EDUCATION: Education details: prognosis, POC, edu on exam findings and how they relate to functional impairments  Person educated: Patient Education method: Explanation Education comprehension: verbalized understanding  HOME EXERCISE PROGRAM: Not yet initiated   GOALS: Goals reviewed with patient? Yes  SHORT TERM GOALS: Target date: 06/19/2024  Patient to be independent with initial HEP. Baseline: HEP initiated Goal status: IN PROGRESS    LONG TERM GOALS: Target date: 07/17/2024  Patient to be independent with advanced HEP. Baseline: Not yet initiated  Goal status: IN PROGRESS  Patient to report plans to participate in home or community  exercise regimen to maintain fitness.  Baseline: not participating Goal status: IN PROGRESS  Patient to score at least 23/30 on FGA in order to decrease risk of falls.  Baseline: 19 Goal status: IN PROGRESS  Patient to demonstrate mild sway with MCTSIB condition 4 to improve stability on uneven surfaces.  Baseline: moderate sway Goal status: IN PROGRESS  Patient to demonstrate good stability with turns and head turns. Baseline: consistent imbalance with turns and L head turns while walking  Goal status: IN PROGRESS  ASSESSMENT:  CLINICAL IMPRESSION: Reports no new issues and has largely resumed typical activity level and notes slight balance impairment but again feels like it may be more related to right ankle/foot issue.  Pt with questions regarding mobility devices if she was to have ankle surgery that required NWB status thereafter, exhibited several different devices for ankle NWB status for functional mobility. Pt education regarding exercise and cardiovascular health and discussed modifiable and non-modifiable risk factors and discussed exercise intensity/duration to guide physical activity.  Verbalized understanding and reports intent to start going to gym for more routine exercise and provided with handout for interval exercise on aerobic machines.  OBJECTIVE IMPAIRMENTS: Abnormal gait, decreased activity tolerance, decreased balance, difficulty walking, decreased ROM, decreased strength, impaired flexibility, postural dysfunction, and pain.   ACTIVITY LIMITATIONS: carrying, lifting, bending, sitting, standing, squatting, sleeping, stairs, transfers, bathing, toileting, dressing, reach over head, hygiene/grooming, and locomotion level  PARTICIPATION LIMITATIONS: meal prep, cleaning, laundry, driving, shopping, community activity, and church  PERSONAL FACTORS: Age, Fitness, Past/current experiences, Time since onset of injury/illness/exacerbation, and 3+ comorbidities: ADD, asthma,  CHF, depression, hx R tibial plateau fx, migraine, R TKA are also affecting patient's functional outcome.   REHAB POTENTIAL: Good  CLINICAL DECISION MAKING: Evolving/moderate complexity  EVALUATION COMPLEXITY: Moderate  PLAN:  PT FREQUENCY: 1x/week  PT DURATION: 6 weeks  PLANNED INTERVENTIONS: [id number]- PT Re-evaluation, 97110-Therapeutic exercises, 97530- Therapeutic activity, 97112- Neuromuscular re-education, 97535- Self Care, [id number]- Manual therapy, [phone]- Gait training, [phone]- Canalith repositioning, Patient/Family education, Balance training, Stair training, Taping, Vestibular training, DME instructions, Cryotherapy, and Moist heat  PLAN FOR NEXT SESSION: vestibular assessment to see if this system is contributing to imbalance, review HEP for balance- turns, head turns, tandem; discuss fitness options keeping R foot pain in mind (*Ask about foot MD appt)         "

## 2024-07-19 ENCOUNTER — Ambulatory Visit: Admitting: Physical Therapy

## 2024-07-19 ENCOUNTER — Telehealth: Payer: Self-pay | Admitting: Neurology

## 2024-07-19 ENCOUNTER — Encounter: Payer: Self-pay | Admitting: Physical Therapy

## 2024-07-19 DIAGNOSIS — R2681 Unsteadiness on feet: Secondary | ICD-10-CM

## 2024-07-19 NOTE — Telephone Encounter (Signed)
 Referral to Dentistry faxed to [facility]  [facility] Phone:[phone] Fax : [phone]

## 2024-07-24 ENCOUNTER — Ambulatory Visit

## 2024-07-24 NOTE — Telephone Encounter (Signed)
 Form signed, faxed back to [phone]. Confirmation received

## 2024-07-28 NOTE — Telephone Encounter (Signed)
 Will send to preop APP, requesting office sent duplicate, see notes.   Inquiring if pt has been cleared.

## 2024-07-31 ENCOUNTER — Ambulatory Visit: Attending: Family Medicine

## 2024-07-31 DIAGNOSIS — R2681 Unsteadiness on feet: Secondary | ICD-10-CM | POA: Diagnosis present

## 2024-07-31 NOTE — Therapy (Signed)
 " OUTPATIENT PHYSICAL THERAPY NEURO TREATMENT   Patient Name: Anne Hogan MRN: [id number] DOB:11-16-1945, 79 y.o., female Today's Date: 07/31/2024   PCP: Janey Santos, MD  REFERRING PROVIDER:  Jama Manuelita RAMAN, NP  END OF SESSION:  PT End of Session - 07/31/24 0929     Visit Number 6    Number of Visits 9    Date for Recertification  08/23/24    Authorization Type [employer]    PT Start Time 0930    PT Stop Time 1015    PT Time Calculation (min) 45 min    Equipment Utilized During Treatment Gait belt    Activity Tolerance Patient tolerated treatment well    Behavior During Therapy WFL for tasks assessed/performed            Past Medical History:  Diagnosis Date   ADD (attention deficit disorder)    Arthritis    right knee (04/19/2013)   Asthma 04/03/2019   CHF (congestive heart failure) (HCC)    Chronic diastolic congestive heart failure (HCC)    Claustrophobia    Depression    Dyspnea    Dysrhythmia    Fracture of right tibial plateau 05/2016   GERD (gastroesophageal reflux disease)    Heart murmur    Incidental pulmonary nodule, greater than or equal to 8mm 06/06/2019   Needs f/u CT in 3 months   Migraine    bad when I was in 5th or 6th grade; I can usually ward them off by resting my head now (04/19/2013)   MVP (mitral valve prolapse)    S/P minimally-invasive mitral valve repair 08/03/2019   Complex valvuloplasty including artificial Gore-tex neochords x12 and Sorin Memo 4D ring annuloplasty, size 38 mm   S/P patent foramen ovale closure 08/03/2019   Sleep apnea    has not used mouth guard in 5 years no cpap used    Squamous carcinoma ?1999   upper left groin (04/19/2013)   Past Surgical History:  Procedure Laterality Date   CARDIAC CATHETERIZATION  05/26/2019   CHOLECYSTECTOMY  04/18/2013   CHOLECYSTECTOMY N/A 04/18/2013   Procedure: LAPAROSCOPIC CHOLECYSTECTOMY WITH INTRAOPERATIVE CHOLANGIOGRAM;  Surgeon: Lynda Leos, MD;  Location: [facility] OR;   Service: General;  Laterality: N/A;   COLONOSCOPY W/ POLYPECTOMY     EYE SURGERY Bilateral FALL  2016   IOC FOR CATARACTS   FRACTURE SURGERY     INTERCOSTAL NERVE BLOCK Left 11/23/2019   Procedure: Intercostal Nerve Block;  Surgeon: Kerrin Elspeth BROCKS, MD;  Location: [facility] OR;  Service: Thoracic;  Laterality: Left;   JOINT REPLACEMENT     KNEE ARTHROSCOPY Right 12/2007   meniscus repair (04/19/2013)   LYMPH NODE DISSECTION Left 11/23/2019   Procedure: Lymph Node Dissection;  Surgeon: Kerrin Elspeth BROCKS, MD;  Location: [facility] OR;  Service: Thoracic;  Laterality: Left;   MITRAL VALVE REPAIR Right 08/03/2019   Procedure: MINIMALLY INVASIVE MITRAL VALVE REPAIR (MVR) using Memo 4D 38 MM Mitral Valve.;  Surgeon: Dusty Sudie DEL, MD;  Location: [facility] OR;  Service: Open Heart Surgery;  Laterality: Right;   REPAIR OF PATENT FORAMEN OVALE N/A 08/03/2019   Procedure: CLOSURE OF PATENT FORAMEN OVALE;  Surgeon: Dusty Sudie DEL, MD;  Location: [facility] OR;  Service: Open Heart Surgery;  Laterality: N/A;   RETINAL TEAR REPAIR CRYOTHERAPY Left ~ 1997   RIGHT/LEFT HEART CATH AND CORONARY ANGIOGRAPHY N/A 05/26/2019   Procedure: RIGHT/LEFT HEART CATH AND CORONARY ANGIOGRAPHY;  Surgeon: Dann Candyce RAMAN, MD;  Location: [facility] INVASIVE  CV LAB;  Service: Cardiovascular;  Laterality: N/A;   TEE WITHOUT CARDIOVERSION N/A 05/26/2019   Procedure: TRANSESOPHAGEAL ECHOCARDIOGRAM (TEE);  Surgeon: Delford Maude BROCKS, MD;  Location: [facility] ENDOSCOPY;  Service: Cardiovascular;  Laterality: N/A;   TEE WITHOUT CARDIOVERSION N/A 08/03/2019   Procedure: TRANSESOPHAGEAL ECHOCARDIOGRAM (TEE);  Surgeon: Dusty Sudie DEL, MD;  Location: [facility] OR;  Service: Open Heart Surgery;  Laterality: N/A;   TONSILLECTOMY  ~ 1952   TOTAL KNEE ARTHROPLASTY Right 09/21/2016   Procedure: RIGHT TOTAL KNEE ARTHROPLASTY;  Surgeon: Dempsey Moan, MD;  Location: [facility] ORS;  Service: Orthopedics;  Laterality: Right;  requests with abductor block   VARICOSE VEIN SURGERY Right  2000's   VASCULAR SURGERY     WRIST SURGERY Right 1975   had a knot taken off (04/19/2013)   Patient Active Problem List   Diagnosis Date Noted   TIA due to embolism (HCC) 05/17/2024   Mild sleep apnea 05/17/2024   TIA (transient ischemic attack) 05/09/2024   CKD stage 3a, GFR 45-59 ml/min (HCC) 05/09/2024   Morning headache 06/28/2023   Pulsatile tinnitus, left ear 06/28/2023   Snoring 06/28/2023   History of sleep apnea 06/28/2023   Adenocarcinoma, lung, left (HCC) 12/25/2019   S/P lobectomy of lung 11/23/2019   S/P minimally-invasive mitral valve repair 08/03/2019   S/P patent foramen ovale closure 08/03/2019   Chronic diastolic congestive heart failure (HCC)    Tricuspid regurgitation    Incidental pulmonary nodule, greater than or equal to 8mm 06/06/2019   Asthma 04/03/2019   OA (osteoarthritis) of knee 09/21/2016   Mitral valve prolapse 08/25/2016   Severe mitral regurgitation 08/25/2016    ONSET DATE: 05/09/24  REFERRING DIAG: G45.9 (ICD-10-CM) - Transient cerebral ischemic attack, unspecified  THERAPY DIAG:  Unsteadiness on feet  Rationale for Evaluation and Treatment: Rehabilitation  SUBJECTIVE:                                                                                                                                                                                             SUBJECTIVE STATEMENT: I had pneumonia through Christmas but I'm feeling like a new person. Foot hasn't been hurting and feel like my balance is better. Reports that she may not be moving forward with foot surgery.    Pt accompanied by: self  PERTINENT HISTORY: ADD, asthma, CHF, depression, hx R tibial plateau fx, migraine, R TKA  PAIN:  Are you having pain? Very little in the R foot  PRECAUTIONS: Fall*Heart monitor x 8 days starting 06/07/2024-avoid sweating and exertion  RED FLAGS: None   WEIGHT BEARING RESTRICTIONS: No  FALLS: Has patient fallen  in last 6 months?  No  LIVING ENVIRONMENT: Lives with: lives alone Lives in: House/apartment Stairs: bedroom on 1st level; 2-4 steps to enter without railing Has following equipment at home: Single point cane and Environmental Consultant - 2 wheeled  PLOF: Independent, Vocation/Vocational requirements: retired, and Leisure: painting, hanging out with her dogs  PATIENT GOALS: pt reports no goals   OBJECTIVE:    TODAY'S TREATMENT: 07/31/24 Activity Comments  Arch lifts without and with resistance 3x10 Red band  Big toe press 2x10 Into red band--toe keeps band pinned to ground  Arch lift in standing--too difficult to maintain   Plantar fascia stretch                TODAY'S TREATMENT: 07/19/24  Activity Comments  FGA 23/30  MCTSIB #4 Mild-moderate sway 30  Gait + head turns CGA-min A required d/t strong L veer with L head turns   Review of HEP: - Oncologist Feet Together: Eyes Open With Head Turns  - 1 x daily - 5 x weekly - 2 sets - 5-10 reps - Corner Balance Feet Together: Eyes Closed With Head Turns  - 1 x daily - 5 x weekly - 2 sets - 5-10 reps - Corner Balance Feet Apart: Eyes Closed With Head Turns  - 1 x daily - 5 x weekly - 2 sets - 5-10 reps - Long Sitting Plantar Fascia Stretch with Towel  - 1 x daily - 7 x weekly - 3 sets - 40-60 sec hold Appropriate sway observed and pt tolerated well   Tandem balance, semi tandem  Occasional chair support               PATIENT EDUCATION: Education details: edu on progress towards goals and remaining impairments, discussed community exercise options and answered pt's questions on aquatic exercise and chair exercise classes, review of HEP and re-administered handout, POC Person educated: Patient Education method: Explanation, Demonstration, Tactile cues, Verbal cues, and Handouts Education comprehension: verbalized understanding and returned demonstration    Access Code: [email or web address] URL: [email or web address] Date: 06/07/2024 Prepared by:  [facility] - Outpatient  Rehab - [facility]  Exercises - Corner Balance Feet Together: Eyes Open With Head Turns  - 1 x daily - 5 x weekly - 2 sets - 5-10 reps - Corner Balance Feet Together: Eyes Closed With Head Turns  - 1 x daily - 5 x weekly - 2 sets - 5-10 reps - Corner Balance Feet Apart: Eyes Closed With Head Turns  - 1 x daily - 5 x weekly - 2 sets - 5-10 reps - Long Sitting Plantar Fascia Stretch with Towel  - 1 x daily - 7 x weekly - 3 sets - 40-60 sec hold - Arch Lifting  - 2-3 x weekly - 3 sets - 10 reps - Ankle Inversion with Anchored Resistance at Table  - 2-3 x weekly - 3 sets - 10 reps - Seated Arch Lifts  - 2-3 x weekly - 3 sets - 10 reps   ------------------------------------------------------------- Note: Objective measures were completed at Evaluation unless otherwise noted.  DIAGNOSTIC FINDINGS: 05/09/24 brain MRI: No acute intracranial abnormality. 2. Multifocal T2 hyperintense white matter signal, most consistent with chronic small vessel disease.  COGNITION: Overall cognitive status: Within functional limits for tasks assessed   SENSATION: Pt reports chronic N/ in R great toe  COORDINATION: Alternating pronation/supination: WNL B Alternating toe tap: WNL B Finger to nose: WNL B    MUSCLE TONE: WNL B LEs   POSTURE:  elevated shoulders, FHP  LOWER EXTREMITY ROM:     Active  Right Eval Left Eval  Hip flexion    Hip extension    Hip abduction    Hip adduction    Hip internal rotation    Hip external rotation    Knee flexion    Knee extension    Ankle dorsiflexion 6 6  Ankle plantarflexion    Ankle inversion    Ankle eversion     (Blank rows = not tested)  LOWER EXTREMITY MMT:    MMT Right Eval Left Eval  Hip flexion 4+ 4+  Hip extension    Hip abduction 4+ 4+  Hip adduction 4+ 4+  Hip internal rotation    Hip external rotation    Knee flexion 4 4  Knee extension 5 5  Ankle dorsiflexion 4+ 4+  Ankle plantarflexion 4+ 4+   Ankle inversion    Ankle eversion    (Blank rows = not tested)  GAIT: Findings: Assistive device utilized:None, Level of assistance: Modified independence and SBA, and Comments: Antalgia on R LE, imbalance with turns   FUNCTIONAL TESTS:       M-CTSIB  Condition 1: Firm Surface, EO 30 Sec, Mild Sway  Condition 2: Firm Surface, EC 30 Sec, Mild and Moderate Sway  Condition 3: Foam Surface, EO 30 Sec, Mild and Moderate Sway  Condition 4: Foam Surface, EC 30 Sec, Moderate Sway                                                                                                                                 TREATMENT DATE: 05/29/24    PATIENT EDUCATION: Education details: prognosis, POC, edu on exam findings and how they relate to functional impairments  Person educated: Patient Education method: Explanation Education comprehension: verbalized understanding  HOME EXERCISE PROGRAM: Not yet initiated   GOALS: Goals reviewed with patient? Yes  SHORT TERM GOALS: Target date: 06/19/2024  Patient to be independent with initial HEP. Baseline: HEP initiated Goal status: IN PROGRESS    LONG TERM GOALS: Target date: 08/23/2024  Patient to be independent with advanced HEP. Baseline: Not yet initiated; pt not performing d/t being sick  07/19/24   Goal status: IN PROGRESS  07/19/24   Patient to report plans to participate in home or community exercise regimen to maintain fitness.  Baseline: not participating; unchanged 07/19/24  Goal status: IN PROGRESS 07/19/24   Patient to score at least 23/30 on FGA in order to decrease risk of falls.  Baseline: 19; 23 07/19/24  Goal status: MET 07/19/24   Patient to demonstrate mild sway with MCTSIB condition 4 to improve stability on uneven surfaces.  Baseline: moderate sway; Mild-moderate sway 30 07/19/24 Goal status: IN PROGRESS 07/19/24   Patient to demonstrate good stability with turns and head turns. Baseline: consistent imbalance  with turns and L head turns while walking; CGA-min A required d/t strong L veer with L head  turns 07/19/24  Goal status: IN PROGRESS 07/19/24   ASSESSMENT:  CLINICAL IMPRESSION: Reports ongoing right foot/ankle dysfunction with pain to medial arch and with resistance to tibilis posterior. Pt instructed in strengthening techniuqes to imrpove foot/ankle control and improve stability for gait.  Good return demonstration to these activities following tactile and visual instruction. Continued sessions to improve balance and foot/ankle pain/mobility  OBJECTIVE IMPAIRMENTS: Abnormal gait, decreased activity tolerance, decreased balance, difficulty walking, decreased ROM, decreased strength, impaired flexibility, postural dysfunction, and pain.   ACTIVITY LIMITATIONS: carrying, lifting, bending, sitting, standing, squatting, sleeping, stairs, transfers, bathing, toileting, dressing, reach over head, hygiene/grooming, and locomotion level  PARTICIPATION LIMITATIONS: meal prep, cleaning, laundry, driving, shopping, community activity, and church  PERSONAL FACTORS: Age, Fitness, Past/current experiences, Time since onset of injury/illness/exacerbation, and 3+ comorbidities: ADD, asthma, CHF, depression, hx R tibial plateau fx, migraine, R TKA are also affecting patient's functional outcome.   REHAB POTENTIAL: Good  CLINICAL DECISION MAKING: Evolving/moderate complexity  EVALUATION COMPLEXITY: Moderate  PLAN:  PT FREQUENCY: 1x/week  PT DURATION: 4 weeks  PLANNED INTERVENTIONS: [id number]- PT Re-evaluation, 97110-Therapeutic exercises, 97530- Therapeutic activity, 97112- Neuromuscular re-education, 97535- Self Care, [id number]- Manual therapy, [phone]- Gait training, [phone]- Canalith repositioning, Patient/Family education, Balance training, Stair training, Taping, Vestibular training, DME instructions, Cryotherapy, and Moist heat  PLAN FOR NEXT SESSION: progress HEP for balance- turns, head turns, tandem;  discuss fitness options keeping R foot pain in mind (*Ask about foot MD appt)    10:28 AM, 07/31/2024 M. Kelly Raelynn Corron, PT, DPT Physical Therapist- [place] Office Number: [phone]       "

## 2024-08-04 NOTE — Therapy (Incomplete)
 " OUTPATIENT PHYSICAL THERAPY NEURO TREATMENT   Patient Name: Anne Hogan MRN: [id number] DOB:1946-03-28, 79 y.o., female Today's Date: 08/04/2024   PCP: Janey Santos, MD  REFERRING PROVIDER:  Jama Manuelita RAMAN, NP  END OF SESSION:      Past Medical History:  Diagnosis Date   ADD (attention deficit disorder)    Arthritis    right knee (04/19/2013)   Asthma 04/03/2019   CHF (congestive heart failure) (HCC)    Chronic diastolic congestive heart failure (HCC)    Claustrophobia    Depression    Dyspnea    Dysrhythmia    Fracture of right tibial plateau 05/2016   GERD (gastroesophageal reflux disease)    Heart murmur    Incidental pulmonary nodule, greater than or equal to 8mm 06/06/2019   Needs f/u CT in 3 months   Migraine    bad when I was in 5th or 6th grade; I can usually ward them off by resting my head now (04/19/2013)   MVP (mitral valve prolapse)    S/P minimally-invasive mitral valve repair 08/03/2019   Complex valvuloplasty including artificial Gore-tex neochords x12 and Sorin Memo 4D ring annuloplasty, size 38 mm   S/P patent foramen ovale closure 08/03/2019   Sleep apnea    has not used mouth guard in 5 years no cpap used    Squamous carcinoma ?1999   upper left groin (04/19/2013)   Past Surgical History:  Procedure Laterality Date   CARDIAC CATHETERIZATION  05/26/2019   CHOLECYSTECTOMY  04/18/2013   CHOLECYSTECTOMY N/A 04/18/2013   Procedure: LAPAROSCOPIC CHOLECYSTECTOMY WITH INTRAOPERATIVE CHOLANGIOGRAM;  Surgeon: Lynda Leos, MD;  Location: [facility] OR;  Service: General;  Laterality: N/A;   COLONOSCOPY W/ POLYPECTOMY     EYE SURGERY Bilateral FALL  2016   IOC FOR CATARACTS   FRACTURE SURGERY     INTERCOSTAL NERVE BLOCK Left 11/23/2019   Procedure: Intercostal Nerve Block;  Surgeon: Kerrin Elspeth BROCKS, MD;  Location: [facility] OR;  Service: Thoracic;  Laterality: Left;   JOINT REPLACEMENT     KNEE ARTHROSCOPY Right 12/2007   meniscus repair (04/19/2013)    LYMPH NODE DISSECTION Left 11/23/2019   Procedure: Lymph Node Dissection;  Surgeon: Kerrin Elspeth BROCKS, MD;  Location: [facility] OR;  Service: Thoracic;  Laterality: Left;   MITRAL VALVE REPAIR Right 08/03/2019   Procedure: MINIMALLY INVASIVE MITRAL VALVE REPAIR (MVR) using Memo 4D 38 MM Mitral Valve.;  Surgeon: Dusty Sudie DEL, MD;  Location: [facility] OR;  Service: Open Heart Surgery;  Laterality: Right;   REPAIR OF PATENT FORAMEN OVALE N/A 08/03/2019   Procedure: CLOSURE OF PATENT FORAMEN OVALE;  Surgeon: Dusty Sudie DEL, MD;  Location: [facility] OR;  Service: Open Heart Surgery;  Laterality: N/A;   RETINAL TEAR REPAIR CRYOTHERAPY Left ~ 1997   RIGHT/LEFT HEART CATH AND CORONARY ANGIOGRAPHY N/A 05/26/2019   Procedure: RIGHT/LEFT HEART CATH AND CORONARY ANGIOGRAPHY;  Surgeon: Dann Candyce RAMAN, MD;  Location: [facility] INVASIVE CV LAB;  Service: Cardiovascular;  Laterality: N/A;   TEE WITHOUT CARDIOVERSION N/A 05/26/2019   Procedure: TRANSESOPHAGEAL ECHOCARDIOGRAM (TEE);  Surgeon: Delford Maude BROCKS, MD;  Location: [facility] ENDOSCOPY;  Service: Cardiovascular;  Laterality: N/A;   TEE WITHOUT CARDIOVERSION N/A 08/03/2019   Procedure: TRANSESOPHAGEAL ECHOCARDIOGRAM (TEE);  Surgeon: Dusty Sudie DEL, MD;  Location: [facility] OR;  Service: Open Heart Surgery;  Laterality: N/A;   TONSILLECTOMY  ~ 1952   TOTAL KNEE ARTHROPLASTY Right 09/21/2016   Procedure: RIGHT TOTAL KNEE ARTHROPLASTY;  Surgeon: Dempsey Moan, MD;  Location: [facility] ORS;  Service: Orthopedics;  Laterality: Right;  requests with abductor block   VARICOSE VEIN SURGERY Right 2000's   VASCULAR SURGERY     WRIST SURGERY Right 1975   had a knot taken off (04/19/2013)   Patient Active Problem List   Diagnosis Date Noted   TIA due to embolism (HCC) 05/17/2024   Mild sleep apnea 05/17/2024   TIA (transient ischemic attack) 05/09/2024   CKD stage 3a, GFR 45-59 ml/min (HCC) 05/09/2024   Morning headache 06/28/2023   Pulsatile tinnitus, left ear 06/28/2023   Snoring  06/28/2023   History of sleep apnea 06/28/2023   Adenocarcinoma, lung, left (HCC) 12/25/2019   S/P lobectomy of lung 11/23/2019   S/P minimally-invasive mitral valve repair 08/03/2019   S/P patent foramen ovale closure 08/03/2019   Chronic diastolic congestive heart failure (HCC)    Tricuspid regurgitation    Incidental pulmonary nodule, greater than or equal to 8mm 06/06/2019   Asthma 04/03/2019   OA (osteoarthritis) of knee 09/21/2016   Mitral valve prolapse 08/25/2016   Severe mitral regurgitation 08/25/2016    ONSET DATE: 05/09/24  REFERRING DIAG: G45.9 (ICD-10-CM) - Transient cerebral ischemic attack, unspecified  THERAPY DIAG:  No diagnosis found.  Rationale for Evaluation and Treatment: Rehabilitation  SUBJECTIVE:                                                                                                                                                                                             SUBJECTIVE STATEMENT: I had pneumonia through Christmas but I'm feeling like a new person. Foot hasn't been hurting and feel like my balance is better. Reports that she may not be moving forward with foot surgery.    Pt accompanied by: self  PERTINENT HISTORY: ADD, asthma, CHF, depression, hx R tibial plateau fx, migraine, R TKA  PAIN:  Are you having pain? Very little in the R foot  PRECAUTIONS: Fall*Heart monitor x 8 days starting 06/07/2024-avoid sweating and exertion  RED FLAGS: None   WEIGHT BEARING RESTRICTIONS: No  FALLS: Has patient fallen in last 6 months? No  LIVING ENVIRONMENT: Lives with: lives alone Lives in: House/apartment Stairs: bedroom on 1st level; 2-4 steps to enter without railing Has following equipment at home: Single point cane and Environmental Consultant - 2 wheeled  PLOF: Independent, Vocation/Vocational requirements: retired, and Leisure: painting, hanging out with her dogs  PATIENT GOALS: pt reports no goals   OBJECTIVE:     TODAY'S  TREATMENT: 08/07/24 Activity Comments  TODAY'S TREATMENT: 07/31/24 Activity Comments  Arch lifts without and with resistance 3x10 Red band  Big toe press 2x10 Into red band--toe keeps band pinned to ground  Arch lift in standing--too difficult to maintain   Plantar fascia stretch                TODAY'S TREATMENT: 07/19/24  Activity Comments  FGA 23/30  MCTSIB #4 Mild-moderate sway 30  Gait + head turns CGA-min A required d/t strong L veer with L head turns   Review of HEP: - Oncologist Feet Together: Eyes Open With Head Turns  - 1 x daily - 5 x weekly - 2 sets - 5-10 reps - Corner Balance Feet Together: Eyes Closed With Head Turns  - 1 x daily - 5 x weekly - 2 sets - 5-10 reps - Corner Balance Feet Apart: Eyes Closed With Head Turns  - 1 x daily - 5 x weekly - 2 sets - 5-10 reps - Long Sitting Plantar Fascia Stretch with Towel  - 1 x daily - 7 x weekly - 3 sets - 40-60 sec hold Appropriate sway observed and pt tolerated well   Tandem balance, semi tandem  Occasional chair support               PATIENT EDUCATION: Education details: edu on progress towards goals and remaining impairments, discussed community exercise options and answered pt's questions on aquatic exercise and chair exercise classes, review of HEP and re-administered handout, POC Person educated: Patient Education method: Explanation, Demonstration, Tactile cues, Verbal cues, and Handouts Education comprehension: verbalized understanding and returned demonstration    Access Code: [email or web address] URL: [email or web address] Date: 06/07/2024 Prepared by: [facility] Out[facility]  Re[facility] - [facility]  Exercises - Corner Balance Feet Together: Eyes Open With Head Turns  - 1 x daily - 5 x weekly - 2 sets - 5-10 reps - Corner Balance Feet Together: Eyes Closed With Head Turns  - 1 x daily - 5 x weekly - 2 sets - 5-10 reps - Corner Balance Feet Apart: Eyes Closed  With Head Turns  - 1 x daily - 5 x weekly - 2 sets - 5-10 reps - Long Sitting Plantar Fascia Stretch with Towel  - 1 x daily - 7 x weekly - 3 sets - 40-60 sec hold - Arch Lifting  - 2-3 x weekly - 3 sets - 10 reps - Ankle Inversion with Anchored Resistance at Table  - 2-3 x weekly - 3 sets - 10 reps - Seated Arch Lifts  - 2-3 x weekly - 3 sets - 10 reps   ------------------------------------------------------------- Note: Objective measures were completed at Evaluation unless otherwise noted.  DIAGNOSTIC FINDINGS: 05/09/24 brain MRI: No acute intracranial abnormality. 2. Multifocal T2 hyperintense white matter signal, most consistent with chronic small vessel disease.  COGNITION: Overall cognitive status: Within functional limits for tasks assessed   SENSATION: Pt reports chronic N/ in R great toe  COORDINATION: Alternating pronation/supination: WNL B Alternating toe tap: WNL B Finger to nose: WNL B    MUSCLE TONE: WNL B LEs   POSTURE: elevated shoulders, FHP  LOWER EXTREMITY ROM:     Active  Right Eval Left Eval  Hip flexion    Hip extension    Hip abduction    Hip adduction    Hip internal rotation    Hip external rotation    Knee flexion    Knee extension    Ankle dorsiflexion 6 6  Ankle plantarflexion  Ankle inversion    Ankle eversion     (Blank rows = not tested)  LOWER EXTREMITY MMT:    MMT Right Eval Left Eval  Hip flexion 4+ 4+  Hip extension    Hip abduction 4+ 4+  Hip adduction 4+ 4+  Hip internal rotation    Hip external rotation    Knee flexion 4 4  Knee extension 5 5  Ankle dorsiflexion 4+ 4+  Ankle plantarflexion 4+ 4+  Ankle inversion    Ankle eversion    (Blank rows = not tested)  GAIT: Findings: Assistive device utilized:None, Level of assistance: Modified independence and SBA, and Comments: Antalgia on R LE, imbalance with turns   FUNCTIONAL TESTS:       M-CTSIB  Condition 1: Firm Surface, EO 30 Sec, Mild Sway   Condition 2: Firm Surface, EC 30 Sec, Mild and Moderate Sway  Condition 3: Foam Surface, EO 30 Sec, Mild and Moderate Sway  Condition 4: Foam Surface, EC 30 Sec, Moderate Sway                                                                                                                                 TREATMENT DATE: 05/29/24    PATIENT EDUCATION: Education details: prognosis, POC, edu on exam findings and how they relate to functional impairments  Person educated: Patient Education method: Explanation Education comprehension: verbalized understanding  HOME EXERCISE PROGRAM: Not yet initiated   GOALS: Goals reviewed with patient? Yes  SHORT TERM GOALS: Target date: 06/19/2024  Patient to be independent with initial HEP. Baseline: HEP initiated Goal status: IN PROGRESS    LONG TERM GOALS: Target date: 08/23/2024  Patient to be independent with advanced HEP. Baseline: Not yet initiated; pt not performing d/t being sick  07/19/24   Goal status: IN PROGRESS  07/19/24   Patient to report plans to participate in home or community exercise regimen to maintain fitness.  Baseline: not participating; unchanged 07/19/24  Goal status: IN PROGRESS 07/19/24   Patient to score at least 23/30 on FGA in order to decrease risk of falls.  Baseline: 19; 23 07/19/24  Goal status: MET 07/19/24   Patient to demonstrate mild sway with MCTSIB condition 4 to improve stability on uneven surfaces.  Baseline: moderate sway; Mild-moderate sway 30 07/19/24 Goal status: IN PROGRESS 07/19/24   Patient to demonstrate good stability with turns and head turns. Baseline: consistent imbalance with turns and L head turns while walking; CGA-min A required d/t strong L veer with L head turns 07/19/24  Goal status: IN PROGRESS 07/19/24   ASSESSMENT:  CLINICAL IMPRESSION: Reports ongoing right foot/ankle dysfunction with pain to medial arch and with resistance to tibilis posterior. Pt instructed in  strengthening techniuqes to imrpove foot/ankle control and improve stability for gait.  Good return demonstration to these activities following tactile and visual instruction. Continued sessions to improve balance and foot/ankle pain/mobility  OBJECTIVE IMPAIRMENTS: Abnormal gait, decreased  activity tolerance, decreased balance, difficulty walking, decreased ROM, decreased strength, impaired flexibility, postural dysfunction, and pain.   ACTIVITY LIMITATIONS: carrying, lifting, bending, sitting, standing, squatting, sleeping, stairs, transfers, bathing, toileting, dressing, reach over head, hygiene/grooming, and locomotion level  PARTICIPATION LIMITATIONS: meal prep, cleaning, laundry, driving, shopping, community activity, and church  PERSONAL FACTORS: Age, Fitness, Past/current experiences, Time since onset of injury/illness/exacerbation, and 3+ comorbidities: ADD, asthma, CHF, depression, hx R tibial plateau fx, migraine, R TKA are also affecting patient's functional outcome.   REHAB POTENTIAL: Good  CLINICAL DECISION MAKING: Evolving/moderate complexity  EVALUATION COMPLEXITY: Moderate  PLAN:  PT FREQUENCY: 1x/week  PT DURATION: 4 weeks  PLANNED INTERVENTIONS: [id number]- PT Re-evaluation, 97110-Therapeutic exercises, 97530- Therapeutic activity, 97112- Neuromuscular re-education, 97535- Self Care, [id number]- Manual therapy, [phone]- Gait training, [phone]- Canalith repositioning, Patient/Family education, Balance training, Stair training, Taping, Vestibular training, DME instructions, Cryotherapy, and Moist heat  PLAN FOR NEXT SESSION: progress HEP for balance- turns, head turns, tandem; discuss fitness options keeping R foot pain in mind (*Ask about foot MD appt)    12:02 PM, 08/04/24 M. Kelly Halpin, PT, DPT Physical Therapist- [place] Office Number: [phone]       "

## 2024-08-07 ENCOUNTER — Ambulatory Visit: Admitting: Physical Therapy

## 2024-08-07 ENCOUNTER — Encounter: Payer: Self-pay | Admitting: Physical Therapy

## 2024-08-07 DIAGNOSIS — R2681 Unsteadiness on feet: Secondary | ICD-10-CM | POA: Diagnosis not present

## 2024-08-07 NOTE — Therapy (Signed)
 " OUTPATIENT PHYSICAL THERAPY NEURO TREATMENT   Patient Name: Anne Hogan MRN: [id number] DOB:[birth year], 79 y.o., female Today's Date: 08/07/2024   PCP: Janey Santos, MD  REFERRING PROVIDER:  Jama Manuelita RAMAN, NP  END OF SESSION:  PT End of Session - 08/07/24 0940     Visit Number 7    Number of Visits 9    Date for Recertification  08/23/24    Authorization Type [employer]    PT Start Time 0940   pt arrives late   PT Stop Time 1015    PT Time Calculation (min) 35 min    Equipment Utilized During Treatment Gait belt    Activity Tolerance Patient tolerated treatment well    Behavior During Therapy WFL for tasks assessed/performed             Past Medical History:  Diagnosis Date   ADD (attention deficit disorder)    Arthritis    right knee (04/19/2013)   Asthma 04/03/2019   CHF (congestive heart failure) (HCC)    Chronic diastolic congestive heart failure (HCC)    Claustrophobia    Depression    Dyspnea    Dysrhythmia    Fracture of right tibial plateau 05/2016   GERD (gastroesophageal reflux disease)    Heart murmur    Incidental pulmonary nodule, greater than or equal to 8mm 06/06/2019   Needs f/u CT in 3 months   Migraine    bad when I was in 5th or 6th grade; I can usually ward them off by resting my head now (04/19/2013)   MVP (mitral valve prolapse)    S/P minimally-invasive mitral valve repair 08/03/2019   Complex valvuloplasty including artificial Gore-tex neochords x12 and Sorin Memo 4D ring annuloplasty, size 38 mm   S/P patent foramen ovale closure 08/03/2019   Sleep apnea    has not used mouth guard in 5 years no cpap used    Squamous carcinoma ?1999   upper left groin (04/19/2013)   Past Surgical History:  Procedure Laterality Date   CARDIAC CATHETERIZATION  05/26/2019   CHOLECYSTECTOMY  04/18/2013   CHOLECYSTECTOMY N/A 04/18/2013   Procedure: LAPAROSCOPIC CHOLECYSTECTOMY WITH INTRAOPERATIVE CHOLANGIOGRAM;  Surgeon: Lynda Leos, MD;   Location: [facility] OR;  Service: General;  Laterality: N/A;   COLONOSCOPY W/ POLYPECTOMY     EYE SURGERY Bilateral FALL  2016   IOC FOR CATARACTS   FRACTURE SURGERY     INTERCOSTAL NERVE BLOCK Left 11/23/2019   Procedure: Intercostal Nerve Block;  Surgeon: Kerrin Elspeth BROCKS, MD;  Location: [facility] OR;  Service: Thoracic;  Laterality: Left;   JOINT REPLACEMENT     KNEE ARTHROSCOPY Right 12/2007   meniscus repair (04/19/2013)   LYMPH NODE DISSECTION Left 11/23/2019   Procedure: Lymph Node Dissection;  Surgeon: Kerrin Elspeth BROCKS, MD;  Location[facility] OR;  Service: Thoracic;  Laterality: Left;   MITRAL VALVE REPAIR Right 08/03/2019   Procedure: MINIMALLY INVASIVE MITRAL VALVE REPAIR (MVR) using Memo 4D 38 MM Mitral Valve.;  Surgeon: Dusty Sudie DEL, MD;  Location: [facility] OR;  Service: Open Heart Surgery;  Laterality: Right;   REPAIR OF PATENT FORAMEN OVALE N/A 08/03/2019   Procedure: CLOSURE OF PATENT FORAMEN OVALE;  Surgeon: Dusty Sudie DEL, MD;  Location: [facility] OR;  Service: Open Heart Surgery;  Laterality: N/A;   RETINAL TEAR REPAIR CRYOTHERAPY Left ~ 1997   RIGHT/LEFT HEART CATH AND CORONARY ANGIOGRAPHY N/A 05/26/2019   Procedure: RIGHT/LEFT HEART CATH AND CORONARY ANGIOGRAPHY;  Surgeon: Dann Candyce RAMAN,  MD;  Location: [facility] INVASIVE CV LAB;  Service: Cardiovascular;  Laterality: N/A;   TEE WITHOUT CARDIOVERSION N/A 05/26/2019   Procedure: TRANSESOPHAGEAL ECHOCARDIOGRAM (TEE);  Surgeon: Delford Maude BROCKS, MD;  Location: [facility] ENDOSCOPY;  Service: Cardiovascular;  Laterality: N/A;   TEE WITHOUT CARDIOVERSION N/A 08/03/2019   Procedure: TRANSESOPHAGEAL ECHOCARDIOGRAM (TEE);  Surgeon: Dusty Sudie DEL, MD;  Location: [facility] OR;  Service: Open Heart Surgery;  Laterality: N/A;   TONSILLECTOMY  ~ 1952   TOTAL KNEE ARTHROPLASTY Right 09/21/2016   Procedure: RIGHT TOTAL KNEE ARTHROPLASTY;  Surgeon: Dempsey Moan, MD;  Location: [facility] ORS;  Service: Orthopedics;  Laterality: Right;  requests with abductor block   VARICOSE  VEIN SURGERY Right 2000's   VASCULAR SURGERY     WRIST SURGERY Right 1975   had a knot taken off (04/19/2013)   Patient Active Problem List   Diagnosis Date Noted   TIA due to embolism (HCC) 05/17/2024   Mild sleep apnea 05/17/2024   TIA (transient ischemic attack) 05/09/2024   CKD stage 3a, GFR 45-59 ml/min (HCC) 05/09/2024   Morning headache 06/28/2023   Pulsatile tinnitus, left ear 06/28/2023   Snoring 06/28/2023   History of sleep apnea 06/28/2023   Adenocarcinoma, lung, left (HCC) 12/25/2019   S/P lobectomy of lung 11/23/2019   S/P minimally-invasive mitral valve repair 08/03/2019   S/P patent foramen ovale closure 08/03/2019   Chronic diastolic congestive heart failure (HCC)    Tricuspid regurgitation    Incidental pulmonary nodule, greater than or equal to 8mm 06/06/2019   Asthma 04/03/2019   OA (osteoarthritis) of knee 09/21/2016   Mitral valve prolapse 08/25/2016   Severe mitral regurgitation 08/25/2016    ONSET DATE: 05/09/24  REFERRING DIAG: G45.9 (ICD-10-CM) - Transient cerebral ischemic attack, unspecified  THERAPY DIAG:  Unsteadiness on feet  Rationale for Evaluation and Treatment: Rehabilitation  SUBJECTIVE:                                                                                                                                                                                             SUBJECTIVE STATEMENT: I can tell the exercises are helping.  I'm not doing them as much as I should.  Did more walking at [place] and my foot was aching.  Will be following up with MD about foot.   Pt accompanied by: self  PERTINENT HISTORY: ADD, asthma, CHF, depression, hx R tibial plateau fx, migraine, R TKA  PAIN:  Are you having pain? Very little in the R foot  PRECAUTIONS: Fall*Heart monitor x 8 days starting 06/07/2024-avoid sweating and exertion  RED FLAGS: None   WEIGHT BEARING  RESTRICTIONS: No  FALLS: Has patient fallen in last 6 months?  No  LIVING ENVIRONMENT: Lives with: lives alone Lives in: House/apartment Stairs: bedroom on 1st level; 2-4 steps to enter without railing Has following equipment at home: Single point cane and Environmental Consultant - 2 wheeled  PLOF: Independent, Vocation/Vocational requirements: retired, and Leisure: painting, hanging out with her dogs  PATIENT GOALS: pt reports no goals   OBJECTIVE:    TODAY'S TREATMENT: 08/07/2024 Activity Comments  Tandem stance Tandem gait  Cues to look ahead at target  Resisted sidestepping Red band, 3 reps at counter  Resisted monster walks forward/backwards  At counter, red counter  Sit to stand 3 x 5 reps -EO -EC -standing on Airex From mat surface-mild sway  Dynamic balance and gait: Gait with head turns Gait with head nods Forward/backwards walking Turn and stop with gait  Improves with practice and with repetition, cues for visual targets               PATIENT EDUCATION: Education details: Updates to HEP-see below Person educated: Patient Education method: Explanation, Demonstration, Tactile cues, Verbal cues, and Handouts Education comprehension: verbalized understanding and returned demonstration    Access Code: [email or web address] URL: [email or web address] Date: 08/07/2024 Prepared by: [facility] - Outpatient  Rehab - [facility]  Exercises - Corner Balance Feet Together: Eyes Open With Head Turns  - 1 x daily - 5 x weekly - 2 sets - 5-10 reps - Corner Balance Feet Together: Eyes Closed With Head Turns  - 1 x daily - 5 x weekly - 2 sets - 5-10 reps - Corner Balance Feet Apart: Eyes Closed With Head Turns  - 1 x daily - 5 x weekly - 2 sets - 5-10 reps - Long Sitting Plantar Fascia Stretch with Towel  - 1 x daily - 7 x weekly - 3 sets - 40-60 sec hold - Arch Lifting  - 2-3 x weekly - 3 sets - 10 reps - Ankle Inversion with Anchored Resistance at Table  - 2-3 x weekly - 3 sets - 10 reps - Seated Arch Lifts  - 2-3 x weekly - 3 sets -  10 reps - Side Stepping with Resistance at Ankles and Counter Support  - 1 x daily - 5 x weekly - 1 sets - 3-5 reps - Forward and Backward Monster Walk with Resistance at Ankles and Counter Support  - 1 x daily - 5 x weekly - 1 sets - 3-5 reps    ------------------------------------------------------------- Note: Objective measures were completed at Evaluation unless otherwise noted.  DIAGNOSTIC FINDINGS: 05/09/24 brain MRI: No acute intracranial abnormality. 2. Multifocal T2 hyperintense white matter signal, most consistent with chronic small vessel disease.  COGNITION: Overall cognitive status: Within functional limits for tasks assessed   SENSATION: Pt reports chronic N/ in R great toe  COORDINATION: Alternating pronation/supination: WNL B Alternating toe tap: WNL B Finger to nose: WNL B    MUSCLE TONE: WNL B LEs   POSTURE: elevated shoulders, FHP  LOWER EXTREMITY ROM:     Active  Right Eval Left Eval  Hip flexion    Hip extension    Hip abduction    Hip adduction    Hip internal rotation    Hip external rotation    Knee flexion    Knee extension    Ankle dorsiflexion 6 6  Ankle plantarflexion    Ankle inversion    Ankle eversion     (Blank rows = not tested)  LOWER EXTREMITY  MMT:    MMT Right Eval Left Eval  Hip flexion 4+ 4+  Hip extension    Hip abduction 4+ 4+  Hip adduction 4+ 4+  Hip internal rotation    Hip external rotation    Knee flexion 4 4  Knee extension 5 5  Ankle dorsiflexion 4+ 4+  Ankle plantarflexion 4+ 4+  Ankle inversion    Ankle eversion    (Blank rows = not tested)  GAIT: Findings: Assistive device utilized:None, Level of assistance: Modified independence and SBA, and Comments: Antalgia on R LE, imbalance with turns   FUNCTIONAL TESTS:       M-CTSIB  Condition 1: Firm Surface, EO 30 Sec, Mild Sway  Condition 2: Firm Surface, EC 30 Sec, Mild and Moderate Sway  Condition 3: Foam Surface, EO 30 Sec, Mild and  Moderate Sway  Condition 4: Foam Surface, EC 30 Sec, Moderate Sway                                                                                                                                 TREATMENT DATE: 05/29/24    PATIENT EDUCATION: Education details: prognosis, POC, edu on exam findings and how they relate to functional impairments  Person educated: Patient Education method: Explanation Education comprehension: verbalized understanding  HOME EXERCISE PROGRAM: Not yet initiated   GOALS: Goals reviewed with patient? Yes  SHORT TERM GOALS: Target date: 06/19/2024  Patient to be independent with initial HEP. Baseline: HEP initiated Goal status: IN PROGRESS    LONG TERM GOALS: Target date: 08/23/2024  Patient to be independent with advanced HEP. Baseline: Not yet initiated; pt not performing d/t being sick  07/19/24   Goal status: IN PROGRESS  07/19/24   Patient to report plans to participate in home or community exercise regimen to maintain fitness.  Baseline: not participating; unchanged 07/19/24  Goal status: IN PROGRESS 07/19/24   Patient to score at least 23/30 on FGA in order to decrease risk of falls.  Baseline: 19; 23 07/19/24  Goal status: MET 07/19/24   Patient to demonstrate mild sway with MCTSIB condition 4 to improve stability on uneven surfaces.  Baseline: moderate sway; Mild-moderate sway 30 07/19/24 Goal status: IN PROGRESS 07/19/24   Patient to demonstrate good stability with turns and head turns. Baseline: consistent imbalance with turns and L head turns while walking; CGA-min A required d/t strong L veer with L head turns 07/19/24  Goal status: IN PROGRESS 07/19/24   ASSESSMENT:  CLINICAL IMPRESSION: Pt presents today with reports of continued minor pain and achiness in foot and would like to work on balance in session today. Skilled PT session focused on static and dynamic balance activities; she has difficulty with tandem stance and tandem  gait-she does improve with cues to look ahead at targets, but still has trouble with RLE in posterior position.  Worked on hip stability exercises, given pt's increased sway with tandem activities. Also worked  on dynamic gait and balance with cues throughout for use of visual target, which pt feels helps her a great deal.  She will continue to benefit from skilled PT towards goals for improved functional mobility and decreased fall risk.  OBJECTIVE IMPAIRMENTS: Abnormal gait, decreased activity tolerance, decreased balance, difficulty walking, decreased ROM, decreased strength, impaired flexibility, postural dysfunction, and pain.   ACTIVITY LIMITATIONS: carrying, lifting, bending, sitting, standing, squatting, sleeping, stairs, transfers, bathing, toileting, dressing, reach over head, hygiene/grooming, and locomotion level  PARTICIPATION LIMITATIONS: meal prep, cleaning, laundry, driving, shopping, community activity, and church  PERSONAL FACTORS: Age, Fitness, Past/current experiences, Time since onset of injury/illness/exacerbation, and 3+ comorbidities: ADD, asthma, CHF, depression, hx R tibial plateau fx, migraine, R TKA are also affecting patient's functional outcome.   REHAB POTENTIAL: Good  CLINICAL DECISION MAKING: Evolving/moderate complexity  EVALUATION COMPLEXITY: Moderate  PLAN:  PT FREQUENCY: 1x/week  PT DURATION: 4 weeks  PLANNED INTERVENTIONS: 97164- PT Re-evaluation, 97110-Therapeutic exercises, 97530- Therapeutic activity, 97112- Neuromuscular re-education, 97535- Self Care, [id number]- Manual therapy, [id number]- Gait training, [phone]- Canalith repositioning, Patient/Family education, Balance training, Stair training, Taping, Vestibular training, DME instructions, Cryotherapy, and Moist heat  PLAN FOR NEXT SESSION: *Need to check LTGs and discuss POC as 1/26 visit is her last scheduled visit.  Progress HEP for balance- turns, head turns, tandem; discuss fitness options keeping R foot  pain in mind (*Ask about foot MD appt)    Greig Anon, PT 08/07/24 9:40 AM Phone: [phone] Fax: [phone]  [facility] at [facility] [address] [place], [place] [address] Phone # [phone] Fax # [phone]      "

## 2024-08-14 ENCOUNTER — Ambulatory Visit

## 2024-08-14 ENCOUNTER — Telehealth: Payer: Self-pay | Admitting: Diagnostic Neuroimaging

## 2024-08-14 MED ORDER — ROSUVASTATIN CALCIUM 20 MG PO TABS: 20.0000 mg | ORAL_TABLET | Freq: Every day | ORAL | 3 refills | Status: AC

## 2024-08-14 MED ORDER — APIXABAN 5 MG PO TABS: 5.0000 mg | ORAL_TABLET | Freq: Two times a day (BID) | ORAL | 3 refills | Status: AC

## 2024-08-14 NOTE — Telephone Encounter (Signed)
 Meds ordered this encounter  Medications   apixaban  (ELIQUIS ) 5 MG TABS tablet    Sig: Take 1 tablet (5 mg total) by mouth 2 (two) times daily.    Dispense:  60 tablet    Refill:  3   rosuvastatin  (CRESTOR ) 20 MG tablet    Sig: Take 1 tablet (20 mg total) by mouth daily.    Dispense:  30 tablet    Refill:  3    EDUARD FABIENE HANLON, MD 08/14/2024, 3:00 PM Certified in Neurology, Neurophysiology and Neuroimaging  [facility] [address] [place], [place] [address] [phone]

## 2024-08-15 ENCOUNTER — Telehealth: Payer: Self-pay

## 2024-08-15 NOTE — Telephone Encounter (Signed)
"  ° °  Pre-operative Risk Assessment    Patient Name: MANVIR THORSON  DOB: 1946/05/31 MRN: [id number]   Date of last office visit: 06/02/24 Date of next office visit: Not scheduled   Request for Surgical Clearance    Procedure:  Right posterior tibialis tendon reconstruction, possible tendon transfer, possible os excision, possible gastrocnemius recession and/or tendoachilles lengthening  Date of Surgery:  Clearance TBD                                Surgeon:  Lillia Mountain Surgeon's Group or Practice Name:  [facility] Phone number:  [phone] Fax number:  [phone]   Type of Clearance Requested:   - Medical  - Pharmacy:  Hold Clopidogrel  (Plavix ) and Apixaban  (Eliquis )     Type of Anesthesia:  General    Additional requests/questions:    Bonney Ival LOISE Gerome   08/15/2024, 3:50 PM   "

## 2024-08-16 NOTE — Telephone Encounter (Signed)
 Pharmacy, can you please provide recommendations for holding Eliquis  for upcoming procedure?  Of note for pre-op APP team, it looks like she is on Plavix  given history of TIA. Therefore, we can defer recommendations for holding this to Neurology or PCP. He will need a virtual visit after pharmacy recommendations.   Thank you!

## 2024-08-18 NOTE — Telephone Encounter (Signed)
 Anne Heck,  [name] is requesting preoperative cardiac evaluation for right posterior tibialis tendon reconstruction.  She was last seen by you in clinic on 06/02/2024.  She had been admitted for TIA.  She was stable from a cardiac standpoint.  She reported that she was doing okay.  She did note an episode of heavy palpitations.  She denied other cardiac symptoms.    Would you be able to comment on cardiac risk for upcoming procedure?  Or, would you recommend that we call her to check-in?  Thank you for your help.  Please direct your response to CVD IV preop pool.  Josefa HERO. Athan Casalino NP-C     08/18/2024, 2:27 PM [facility] [address] [place], [place] [address] Office [phone]

## 2024-08-18 NOTE — Telephone Encounter (Signed)
 Patient with diagnosis of afib on Eliquis  for anticoagulation.    Procedure:  Right posterior tibialis tendon reconstruction, possible tendon transfer, possible os excision, possible gastrocnemius recession and/or tendoachilles lengthening  Date of procedure: TBD   CHA2DS2-VASc Score = 7   This indicates a 11.2% annual risk of stroke. The patient's score is based upon: CHF History: 1 HTN History: 1 Diabetes History: 0 Stroke History: 2 Vascular Disease History: 0 Age Score: 2 Gender Score: 1      CrCl 43 ml/min Platelet count 286  Patient has not had an Afib/aflutter ablation in the last 3 months, DCCV within the last 4 weeks or a watchman implanted in the last 45 days   Patient with post op afib. TIA on 05/09/24 while not on anticoagulation.  Patient is 3 months out from TIA. WIll confirm with Dr. Inocencio he is ok with holding Eliquis  for 2 days.   **This guidance is not considered finalized until pre-operative APP has relayed final recommendations.**

## 2024-08-21 ENCOUNTER — Ambulatory Visit: Admitting: Physical Therapy

## 2024-08-22 ENCOUNTER — Telehealth (HOSPITAL_BASED_OUTPATIENT_CLINIC_OR_DEPARTMENT_OTHER): Payer: Self-pay | Admitting: *Deleted

## 2024-08-22 NOTE — Telephone Encounter (Signed)
 Pt has been scheduled tele preop appt 08/25/24. Med rec and consent are done.      Patient Consent for Virtual Visit        Anne Hogan has provided verbal consent on 08/22/2024 for a virtual visit (video or telephone).   CONSENT FOR VIRTUAL VISIT FOR:  Anne Hogan  By participating in this virtual visit I agree to the following:  I hereby voluntarily request, consent and authorize [facility] and its employed or contracted physicians, physician assistants, nurse practitioners or other licensed health care professionals (the Practitioner), to provide me with telemedicine health care services (the Services) as deemed necessary by the treating Practitioner. I acknowledge and consent to receive the Services by the Practitioner via telemedicine. I understand that the telemedicine visit will involve communicating with the Practitioner through live audiovisual communication technology and the disclosure of certain medical information by electronic transmission. I acknowledge that I have been given the opportunity to request an in-person assessment or other available alternative prior to the telemedicine visit and am voluntarily participating in the telemedicine visit.  I understand that I have the right to withhold or withdraw my consent to the use of telemedicine in the course of my care at any time, without affecting my right to future care or treatment, and that the Practitioner or I may terminate the telemedicine visit at any time. I understand that I have the right to inspect all information obtained and/or recorded in the course of the telemedicine visit and may receive copies of available information for a reasonable fee.  I understand that some of the potential risks of receiving the Services via telemedicine include:  Delay or interruption in medical evaluation due to technological equipment failure or disruption; Information transmitted may not be sufficient (e.g. poor resolution  of images) to allow for appropriate medical decision making by the Practitioner; and/or  In rare instances, security protocols could fail, causing a breach of personal health information.  Furthermore, I acknowledge that it is my responsibility to provide information about my medical history, conditions and care that is complete and accurate to the best of my ability. I acknowledge that Practitioner's advice, recommendations, and/or decision may be based on factors not within their control, such as incomplete or inaccurate data provided by me or distortions of diagnostic images or specimens that may result from electronic transmissions. I understand that the practice of medicine is not an exact science and that Practitioner makes no warranties or guarantees regarding treatment outcomes. I acknowledge that a copy of this consent can be made available to me via my patient portal [facility] MyChart), or I can request a printed copy by calling the office of [facility].    I understand that my insurance will be billed for this visit.   I have read or had this consent read to me. I understand the contents of this consent, which adequately explains the benefits and risks of the Services being provided via telemedicine.  I have been provided ample opportunity to ask questions regarding this consent and the Services and have had my questions answered to my satisfaction. I give my informed consent for the services to be provided through the use of telemedicine in my medical care

## 2024-08-22 NOTE — Telephone Encounter (Signed)
" ° °  Name: Anne Hogan  DOB: 04-11-1946  MRN: [id number]  Primary Cardiologist: Will Gladis Norton, MD   Preoperative team, please contact this patient and set up a phone call appointment for further preoperative risk assessment. Please obtain consent and complete medication review. Thank you for your help.  I confirm that guidance regarding antiplatelet and oral anticoagulation therapy has been completed and, if necessary, noted below.  Her Eliquis  may be held for 2 days prior to her procedure.  Her Plavix  is not prescribed by cardiology.  Recommendations for holding Plavix  will need to come from prescribing provider.  She has history of TIA.  I also confirmed the patient resides in the state of [place] . As per [facility] [employer] telemedicine laws, the patient must reside in the state in which the provider is licensed.   Josefa CHRISTELLA Beauvais, NP 08/22/2024, 8:26 AM [facility]    "

## 2024-08-22 NOTE — Telephone Encounter (Signed)
 Pt has been scheduled tele preop appt 08/25/24. Med rec and consent are done.

## 2024-08-25 ENCOUNTER — Telehealth: Payer: Self-pay | Admitting: Cardiology

## 2024-08-25 ENCOUNTER — Ambulatory Visit

## 2024-08-25 DIAGNOSIS — Z01810 Encounter for preprocedural cardiovascular examination: Secondary | ICD-10-CM

## 2024-08-25 MED ORDER — DOXYCYCLINE HYCLATE 100 MG PO CAPS: ORAL_CAPSULE | ORAL | 3 refills | Status: AC

## 2024-08-25 NOTE — Therapy (Incomplete)
 " OUTPATIENT PHYSICAL THERAPY NEURO TREATMENT   Patient Name: Anne Hogan MRN: [id number] DOB:[birth year], 79 y.o., female Today's Date: 08/25/2024   PCP: Janey Santos, MD  REFERRING PROVIDER:  Jama Manuelita RAMAN, NP  END OF SESSION:       Past Medical History:  Diagnosis Date   ADD (attention deficit disorder)    Arthritis    right knee (04/19/2013)   Asthma 04/03/2019   CHF (congestive heart failure) (HCC)    Chronic diastolic congestive heart failure (HCC)    Claustrophobia    Depression    Dyspnea    Dysrhythmia    Fracture of right tibial plateau 05/2016   GERD (gastroesophageal reflux disease)    Heart murmur    Incidental pulmonary nodule, greater than or equal to 8mm 06/06/2019   Needs f/u CT in 3 months   Migraine    bad when I was in 5th or 6th grade; I can usually ward them off by resting my head now (04/19/2013)   MVP (mitral valve prolapse)    S/P minimally-invasive mitral valve repair 08/03/2019   Complex valvuloplasty including artificial Gore-tex neochords x12 and Sorin Memo 4D ring annuloplasty, size 38 mm   S/P patent foramen ovale closure 08/03/2019   Sleep apnea    has not used mouth guard in 5 years no cpap used    Squamous carcinoma ?1999   upper left groin (04/19/2013)   Past Surgical History:  Procedure Laterality Date   CARDIAC CATHETERIZATION  05/26/2019   CHOLECYSTECTOMY  04/18/2013   CHOLECYSTECTOMY N/A 04/18/2013   Procedure: LAPAROSCOPIC CHOLECYSTECTOMY WITH INTRAOPERATIVE CHOLANGIOGRAM;  Surgeon: Lynda Leos, MD;  Location: [facility] OR;  Service: General;  Laterality: N/A;   COLONOSCOPY W/ POLYPECTOMY     EYE SURGERY Bilateral FALL  2016   IOC FOR CATARACTS   FRACTURE SURGERY     INTERCOSTAL NERVE BLOCK Left 11/23/2019   Procedure: Intercostal Nerve Block;  Surgeon: Kerrin Elspeth BROCKS, MD;  Location: [facility] OR;  Service: Thoracic;  Laterality: Left;   JOINT REPLACEMENT     KNEE ARTHROSCOPY Right 12/2007   meniscus repair (04/19/2013)    LYMPH NODE DISSECTION Left 11/23/2019   Procedure: Lymph Node Dissection;  Surgeon: Kerrin Elspeth BROCKS, MD;  Location: [facility] OR;  Service: Thoracic;  Laterality: Left;   MITRAL VALVE REPAIR Right 08/03/2019   Procedure: MINIMALLY INVASIVE MITRAL VALVE REPAIR (MVR) using Memo 4D 38 MM Mitral Valve.;  Surgeon: Dusty Sudie DEL, MD;  Location: [facility] OR;  Service: Open Heart Surgery;  Laterality: Right;   REPAIR OF PATENT FORAMEN OVALE N/A 08/03/2019   Procedure: CLOSURE OF PATENT FORAMEN OVALE;  Surgeon: Dusty Sudie DEL, MD;  Location: [facility] OR;  Service: Open Heart Surgery;  Laterality: N/A;   RETINAL TEAR REPAIR CRYOTHERAPY Left ~ 1997   RIGHT/LEFT HEART CATH AND CORONARY ANGIOGRAPHY N/A 05/26/2019   Procedure: RIGHT/LEFT HEART CATH AND CORONARY ANGIOGRAPHY;  Surgeon: Dann Candyce RAMAN, MD;  Location: [facility] INVASIVE CV LAB;  Service: Cardiovascular;  Laterality: N/A;   TEE WITHOUT CARDIOVERSION N/A 05/26/2019   Procedure: TRANSESOPHAGEAL ECHOCARDIOGRAM (TEE);  Surgeon: Delford Maude BROCKS, MD;  Location: [place] ENDOSCOPY;  Service: Cardiovascular;  Laterality: N/A;   TEE WITHOUT CARDIOVERSION N/A 08/03/2019   Procedure: TRANSESOPHAGEAL ECHOCARDIOGRAM (TEE);  Surgeon: Dusty Sudie DEL, MD;  Location: [facility] OR;  Service: Open Heart Surgery;  Laterality: N/A;   TONSILLECTOMY  ~ 1952   TOTAL KNEE ARTHROPLASTY Right 09/21/2016   Procedure: RIGHT TOTAL KNEE ARTHROPLASTY;  Surgeon: Dempsey Moan, MD;  Location: [facility] ORS;  Service: Orthopedics;  Laterality: Right;  requests with abductor block   VARICOSE VEIN SURGERY Right 2000's   VASCULAR SURGERY     WRIST SURGERY Right 1975   had a knot taken off (04/19/2013)   Patient Active Problem List   Diagnosis Date Noted   TIA due to embolism (HCC) 05/17/2024   Mild sleep apnea 05/17/2024   TIA (transient ischemic attack) 05/09/2024   CKD stage 3a, GFR 45-59 ml/min (HCC) 05/09/2024   Morning headache 06/28/2023   Pulsatile tinnitus, left ear 06/28/2023   Snoring  06/28/2023   History of sleep apnea 06/28/2023   Adenocarcinoma, lung, left (HCC) 12/25/2019   S/P lobectomy of lung 11/23/2019   S/P minimally-invasive mitral valve repair 08/03/2019   S/P patent foramen ovale closure 08/03/2019   Chronic diastolic congestive heart failure (HCC)    Tricuspid regurgitation    Incidental pulmonary nodule, greater than or equal to 8mm 06/06/2019   Asthma 04/03/2019   OA (osteoarthritis) of knee 09/21/2016   Mitral valve prolapse 08/25/2016   Severe mitral regurgitation 08/25/2016    ONSET DATE: 05/09/24  REFERRING DIAG: G45.9 (ICD-10-CM) - Transient cerebral ischemic attack, unspecified  THERAPY DIAG:  No diagnosis found.  Rationale for Evaluation and Treatment: Rehabilitation  SUBJECTIVE:                                                                                                                                                                                             SUBJECTIVE STATEMENT: I can tell the exercises are helping.  I'm not doing them as much as I should.  Did more walking at [place] and my foot was aching.  Will be following up with MD about foot.   Pt accompanied by: self  PERTINENT HISTORY: ADD, asthma, CHF, depression, hx R tibial plateau fx, migraine, R TKA  PAIN:  Are you having pain? Very little in the R foot  PRECAUTIONS: Fall*Heart monitor x 8 days starting 06/07/2024-avoid sweating and exertion  RED FLAGS: None   WEIGHT BEARING RESTRICTIONS: No  FALLS: Has patient fallen in last 6 months? No  LIVING ENVIRONMENT: Lives with: lives alone Lives in: House/apartment Stairs: bedroom on 1st level; 2-4 steps to enter without railing Has following equipment at home: Single point cane and Environmental Consultant - 2 wheeled  PLOF: Independent, Vocation/Vocational requirements: retired, and Leisure: painting, hanging out with her dogs  PATIENT GOALS: pt reports no goals   OBJECTIVE:     TODAY'S TREATMENT:  08/28/24 Activity Comments  TODAY'S TREATMENT: 08/07/2024 Activity Comments  Tandem stance Tandem gait  Cues to look ahead at target  Resisted sidestepping Red band, 3 reps at counter  Resisted monster walks forward/backwards  At counter, red counter  Sit to stand 3 x 5 reps -EO -EC -standing on Airex From mat surface-mild sway  Dynamic balance and gait: Gait with head turns Gait with head nods Forward/backwards walking Turn and stop with gait  Improves with practice and with repetition, cues for visual targets               PATIENT EDUCATION: Education details: Updates to HEP-see below Person educated: Patient Education method: Explanation, Demonstration, Tactile cues, Verbal cues, and Handouts Education comprehension: verbalized understanding and returned demonstration    Access Code: [email or web address] URL: [email or web address] Date: 08/07/2024 Prepared by: [facility] - [facility]  Exercises - Corner Balance Feet Together: Eyes Open With Head Turns  - 1 x daily - 5 x weekly - 2 sets - 5-10 reps - Corner Balance Feet Together: Eyes Closed With Head Turns  - 1 x daily - 5 x weekly - 2 sets - 5-10 reps - Corner Balance Feet Apart: Eyes Closed With Head Turns  - 1 x daily - 5 x weekly - 2 sets - 5-10 reps - Long Sitting Plantar Fascia Stretch with Towel  - 1 x daily - 7 x weekly - 3 sets - 40-60 sec hold - Arch Lifting  - 2-3 x weekly - 3 sets - 10 reps - Ankle Inversion with Anchored Resistance at Table  - 2-3 x weekly - 3 sets - 10 reps - Seated Arch Lifts  - 2-3 x weekly - 3 sets - 10 reps - Side Stepping with Resistance at Ankles and Counter Support  - 1 x daily - 5 x weekly - 1 sets - 3-5 reps - Forward and Backward Monster Walk with Resistance at Ankles and Counter Support  - 1 x daily - 5 x weekly - 1 sets - 3-5 reps    ------------------------------------------------------------- Note: Objective  measures were completed at Evaluation unless otherwise noted.  DIAGNOSTIC FINDINGS: 05/09/24 brain MRI: No acute intracranial abnormality. 2. Multifocal T2 hyperintense white matter signal, most consistent with chronic small vessel disease.  COGNITION: Overall cognitive status: Within functional limits for tasks assessed   SENSATION: Pt reports chronic N/ in R great toe  COORDINATION: Alternating pronation/supination: WNL B Alternating toe tap: WNL B Finger to nose: WNL B    MUSCLE TONE: WNL B LEs   POSTURE: elevated shoulders, FHP  LOWER EXTREMITY ROM:     Active  Right Eval Left Eval  Hip flexion    Hip extension    Hip abduction    Hip adduction    Hip internal rotation    Hip external rotation    Knee flexion    Knee extension    Ankle dorsiflexion 6 6  Ankle plantarflexion    Ankle inversion    Ankle eversion     (Blank rows = not tested)  LOWER EXTREMITY MMT:    MMT Right Eval Left Eval  Hip flexion 4+ 4+  Hip extension    Hip abduction 4+ 4+  Hip adduction 4+ 4+  Hip internal rotation    Hip external rotation    Knee flexion 4 4  Knee extension 5 5  Ankle dorsiflexion 4+ 4+  Ankle plantarflexion 4+ 4+  Ankle inversion    Ankle eversion    (Blank rows =  not tested)  GAIT: Findings: Assistive device utilized:None, Level of assistance: Modified independence and SBA, and Comments: Antalgia on R LE, imbalance with turns   FUNCTIONAL TESTS:       M-CTSIB  Condition 1: Firm Surface, EO 30 Sec, Mild Sway  Condition 2: Firm Surface, EC 30 Sec, Mild and Moderate Sway  Condition 3: Foam Surface, EO 30 Sec, Mild and Moderate Sway  Condition 4: Foam Surface, EC 30 Sec, Moderate Sway                                                                                                                                 TREATMENT DATE: 05/29/24    PATIENT EDUCATION: Education details: prognosis, POC, edu on exam findings and how they relate to  functional impairments  Person educated: Patient Education method: Explanation Education comprehension: verbalized understanding  HOME EXERCISE PROGRAM: Not yet initiated   GOALS: Goals reviewed with patient? Yes  SHORT TERM GOALS: Target date: 06/19/2024  Patient to be independent with initial HEP. Baseline: HEP initiated Goal status: IN PROGRESS    LONG TERM GOALS: Target date: 08/23/2024  Patient to be independent with advanced HEP. Baseline: Not yet initiated; pt not performing d/t being sick  07/19/24   Goal status: IN PROGRESS  07/19/24   Patient to report plans to participate in home or community exercise regimen to maintain fitness.  Baseline: not participating; unchanged 07/19/24  Goal status: IN PROGRESS 07/19/24   Patient to score at least 23/30 on FGA in order to decrease risk of falls.  Baseline: 19; 23 07/19/24  Goal status: MET 07/19/24   Patient to demonstrate mild sway with MCTSIB condition 4 to improve stability on uneven surfaces.  Baseline: moderate sway; Mild-moderate sway 30 07/19/24 Goal status: IN PROGRESS 07/19/24   Patient to demonstrate good stability with turns and head turns. Baseline: consistent imbalance with turns and L head turns while walking; CGA-min A required d/t strong L veer with L head turns 07/19/24  Goal status: IN PROGRESS 07/19/24   ASSESSMENT:  CLINICAL IMPRESSION: Pt presents today with reports of continued minor pain and achiness in foot and would like to work on balance in session today. Skilled PT session focused on static and dynamic balance activities; she has difficulty with tandem stance and tandem gait-she does improve with cues to look ahead at targets, but still has trouble with RLE in posterior position.  Worked on hip stability exercises, given pt's increased sway with tandem activities. Also worked on dynamic gait and balance with cues throughout for use of visual target, which pt feels helps her a great deal.  She  will continue to benefit from skilled PT towards goals for improved functional mobility and decreased fall risk.  OBJECTIVE IMPAIRMENTS: Abnormal gait, decreased activity tolerance, decreased balance, difficulty walking, decreased ROM, decreased strength, impaired flexibility, postural dysfunction, and pain.   ACTIVITY LIMITATIONS: carrying, lifting, bending, sitting, standing, squatting, sleeping, stairs, transfers,  bathing, toileting, dressing, reach over head, hygiene/grooming, and locomotion level  PARTICIPATION LIMITATIONS: meal prep, cleaning, laundry, driving, shopping, community activity, and church  PERSONAL FACTORS: Age, Fitness, Past/current experiences, Time since onset of injury/illness/exacerbation, and 3+ comorbidities: ADD, asthma, CHF, depression, hx R tibial plateau fx, migraine, R TKA are also affecting patient's functional outcome.   REHAB POTENTIAL: Good  CLINICAL DECISION MAKING: Evolving/moderate complexity  EVALUATION COMPLEXITY: Moderate  PLAN:  PT FREQUENCY: 1x/week  PT DURATION: 4 weeks  PLANNED INTERVENTIONS: [id number]- PT Re-evaluation, 97110-Therapeutic exercises, 97530- Therapeutic activity, 97112- Neuromuscular re-education, 97535- Self Care, [id number]- Manual therapy, [id number]- Gait training, [phone]- Canalith repositioning, Patient/Family education, Balance training, Stair training, Taping, Vestibular training, DME instructions, Cryotherapy, and Moist heat  PLAN FOR NEXT SESSION: *Need to check LTGs and discuss POC as 1/26 visit is her last scheduled visit.  Progress HEP for balance- turns, head turns, tandem; discuss fitness options keeping R foot pain in mind (*Ask about foot MD appt)         "

## 2024-08-25 NOTE — Telephone Encounter (Signed)
 Great thank you!  I was on the other line when she called back here. Thanks!  Orren LOISE Fabry, PA-C

## 2024-08-25 NOTE — Telephone Encounter (Signed)
 Patient wanted to report to T. Lucien, [place] that she also did 30 minutes of in-house walking and without much foot pain and will sent that as a goal for 20 minutes a day.

## 2024-08-25 NOTE — Progress Notes (Signed)
 "   Virtual Visit via Telephone Note   Because of Anne Hogan co-morbid illnesses, she is at least at moderate risk for complications without adequate follow up.  This format is felt to be most appropriate for this patient at this time.  Due to technical limitations with video connection (technology), today's appointment will be conducted as an audio only telehealth visit, and Anne Hogan verbally agreed to proceed in this manner.   All issues noted in this document were discussed and addressed.  No physical exam could be performed with this format.  Evaluation Performed:  Preoperative cardiovascular risk assessment _____________   Date:  08/25/2024   Patient ID:  Anne Hogan, DOB Nov 14, 1945, MRN [id number] Patient Location:  Home Provider location:   Office  Primary Care Provider:  Janey Santos, MD Primary Cardiologist:  Will Gladis Norton, MD  Chief Complaint / Patient Profile   79 y.o. y/o female with a h/o severe MR status post mini MVR and PFO closure, TIA, hypertension, paroxysmal atrial fibrillation, palpitations, history of non-small cell lung cancer who is pending  Right posterior tibialis tendon reconstruction, possible tendon transfer, possible os excision, possible gastrocnemius recession and/or tendoachilles lengthening and presents today for telephonic preoperative cardiovascular risk assessment.  History of Present Illness    Anne Hogan is a 79 y.o. female who presents via audio/video conferencing for a telehealth visit today.  Pt was last seen in cardiology clinic on 06/02/2024 by Jodie Passey, PA-C.  At that time Anne Hogan was doing well.  The patient is now pending procedure as outlined above. Since her last visit, she has been doing well overall.  She does have some issues with her knees and that limits her mobility.  She does meet 4 on the DASI.  Her Plavix  is not prescribed by cardiology. Recommendations for holding Plavix  will need to come from  prescribing provider. She has history of TIA. Patient is 3 months out from TIA. WIll confirm with Dr. Norton he is ok with holding Eliquis  for 2 days.   Past Medical History    Past Medical History:  Diagnosis Date   ADD (attention deficit disorder)    Arthritis    right knee (04/19/2013)   Asthma 04/03/2019   CHF (congestive heart failure) (HCC)    Chronic diastolic congestive heart failure (HCC)    Claustrophobia    Depression    Dyspnea    Dysrhythmia    Fracture of right tibial plateau 05/2016   GERD (gastroesophageal reflux disease)    Heart murmur    Incidental pulmonary nodule, greater than or equal to 8mm 06/06/2019   Needs f/u CT in 3 months   Migraine    bad when I was in 5th or 6th grade; I can usually ward them off by resting my head now (04/19/2013)   MVP (mitral valve prolapse)    S/P minimally-invasive mitral valve repair 08/03/2019   Complex valvuloplasty including artificial Gore-tex neochords x12 and Sorin Memo 4D ring annuloplasty, size 38 mm   S/P patent foramen ovale closure 08/03/2019   Sleep apnea    has not used mouth guard in 5 years no cpap used    Squamous carcinoma ?1999   upper left groin (04/19/2013)   Past Surgical History:  Procedure Laterality Date   CARDIAC CATHETERIZATION  05/26/2019   CHOLECYSTECTOMY  04/18/2013   CHOLECYSTECTOMY N/A 04/18/2013   Procedure: LAPAROSCOPIC CHOLECYSTECTOMY WITH INTRAOPERATIVE CHOLANGIOGRAM;  Surgeon: Lynda Leos, MD;  Location: [facility] OR;  Service: General;  Laterality: N/A;   COLONOSCOPY W/ POLYPECTOMY     EYE SURGERY Bilateral FALL  2016   IOC FOR CATARACTS   FRACTURE SURGERY     INTERCOSTAL NERVE BLOCK Left 11/23/2019   Procedure: Intercostal Nerve Block;  Surgeon: Kerrin Elspeth BROCKS, MD;  Location[facility] OR;  Service: Thoracic;  Laterality: Left;   JOINT REPLACEMENT     KNEE ARTHROSCOPY Right 12/2007   meniscus repair (04/19/2013)   LYMPH NODE DISSECTION Left 11/23/2019   Procedure: Lymph Node  Dissection;  Surgeon: Kerrin Elspeth BROCKS, MD;  Location: [facility] OR;  Service: Thoracic;  Laterality: Left;   MITRAL VALVE REPAIR Right 08/03/2019   Procedure: MINIMALLY INVASIVE MITRAL VALVE REPAIR (MVR) using Memo 4D 38 MM Mitral Valve.;  Surgeon: Dusty Sudie DEL, MD;  Location: [facility] OR;  Service: Open Heart Surgery;  Laterality: Right;   REPAIR OF PATENT FORAMEN OVALE N/A 08/03/2019   Procedure: CLOSURE OF PATENT FORAMEN OVALE;  Surgeon: Dusty Sudie DEL, MD;  Location: [facility] OR;  Service: Open Heart Surgery;  Laterality: N/A;   RETINAL TEAR REPAIR CRYOTHERAPY Left ~ 1997   RIGHT/LEFT HEART CATH AND CORONARY ANGIOGRAPHY N/A 05/26/2019   Procedure: RIGHT/LEFT HEART CATH AND CORONARY ANGIOGRAPHY;  Surgeon: Dann Candyce RAMAN, MD;  Location: [facility] INVASIVE CV LAB;  Service: Cardiovascular;  Laterality: N/A;   TEE WITHOUT CARDIOVERSION N/A 05/26/2019   Procedure: TRANSESOPHAGEAL ECHOCARDIOGRAM (TEE);  Surgeon: Delford Maude BROCKS, MD;  Location: [facility] ENDOSCOPY;  Service: Cardiovascular;  Laterality: N/A;   TEE WITHOUT CARDIOVERSION N/A 08/03/2019   Procedure: TRANSESOPHAGEAL ECHOCARDIOGRAM (TEE);  Surgeon: Dusty Sudie DEL, MD;  Location: [facility] OR;  Service: Open Heart Surgery;  Laterality: N/A;   TONSILLECTOMY  ~ 1952   TOTAL KNEE ARTHROPLASTY Right 09/21/2016   Procedure: RIGHT TOTAL KNEE ARTHROPLASTY;  Surgeon: Dempsey Moan, MD;  Location: [facility] ORS;  Service: Orthopedics;  Laterality: Right;  requests with abductor block   VARICOSE VEIN SURGERY Right 2000's   VASCULAR SURGERY     WRIST SURGERY Right 1975   had a knot taken off (04/19/2013)    Allergies  Allergies[1]  Home Medications    Prior to Admission medications  Medication Sig Start Date End Date Taking? Authorizing Provider  acetaminophen  (TYLENOL ) 500 MG tablet Take 500-1,000 mg by mouth every 6 (six) hours as needed for moderate pain (pain).     [provider]  Alum Hydroxide-Mag Carbonate (GAVISCON PO) Take 1 tablet by mouth 4  (four) times daily as needed (acid reflux).    [provider]  apixaban  (ELIQUIS ) 5 MG TABS tablet Take 1 tablet (5 mg total) by mouth 2 (two) times daily. 08/14/24   Penumalli, Vikram R, MD  B Complex-C (SUPER B-COMPLEX + VITAMIN C PO) Take 1 tablet by mouth daily at 6 (six) AM.    [provider]  Berberine Chloride (BERBERINE HCI PO) Take 1 tablet by mouth daily at 6 (six) AM.    [provider]  BIOTIN PO Take 1 tablet by mouth daily at 6 (six) AM.    [provider]  BREO ELLIPTA  100-25 MCG/INH AEPB USE 1 PUFF DAILY AS DIRECTED 02/16/20   Mannam, Praveen, MD  clopidogrel  (PLAVIX ) 75 MG tablet Take 1 tablet (75 mg total) by mouth daily. 05/17/24   Dohmeier, Dedra, MD  doxycycline  (VIBRAMYCIN ) 100 MG capsule Take 1 capsule (100 mg total) one hour prior to any dental procedure 06/19/22   Camnitz, Soyla Lunger, MD  Ginkgo Biloba 40 MG TABS  Take 1 tablet by mouth daily.    [provider]  lidocaine  (LIDODERM ) 5 % Place 1 patch onto the skin daily. Remove & Discard patch within 12 hours or as directed by MD 04/09/24   Simon Lavonia SAILOR, MD  lidocaine  (LIDODERM ) 5 % Place 1 patch onto the skin daily. Remove & Discard patch within 12 hours or as directed by MD 07/11/24   Dasie Faden, MD  MAGNESIUM  PO Take 1 tablet by mouth daily.    [provider]  methocarbamol  (ROBAXIN ) 500 MG tablet Take 1 tablet (500 mg total) by mouth 2 (two) times daily. 07/11/24   Dasie Faden, MD  montelukast  (SINGULAIR ) 10 MG tablet Take 10 mg by mouth at bedtime.    [provider]  Multiple Vitamins-Minerals (CENTRUM SILVER 50+WOMEN PO) Take 1 tablet by mouth daily.    [provider]  psyllium (REGULOID) 0.52 g capsule Take 3 capsules by mouth daily.    [provider]  rosuvastatin  (CRESTOR ) 20 MG tablet Take 1 tablet (20 mg total) by mouth daily. 08/14/24   Penumalli, Vikram R, MD  VITAMIN D PO Take 1 tablet by mouth See admin instructions.  Takes 1 tablet on Sunday and 1 tablet on Thursday    [provider]    Physical Exam    Vital Signs:  Anne Hogan does not have vital signs available for review today.  Given telephonic nature of communication, physical exam is limited. AAOx3. NAD. Normal affect.  Speech and respirations are unlabored.  Accessory Clinical Findings    None  Assessment & Plan    1.  Preoperative Cardiovascular Risk Assessment:  Anne Hogan perioperative risk of a major cardiac event is 0.4% according to the Revised Cardiac Risk Index (RCRI).  Therefore, she is at low risk for perioperative complications.   Her functional capacity is good at 5.81 METs according to the [facility] Activity Status Index (DASI). Recommendations: According to ACC/AHA guidelines, no further cardiovascular testing needed.  The patient may proceed to surgery at acceptable risk.   Antiplatelet and/or Anticoagulation Recommendations:  Eliquis  (Apixaban ) can be held for 2 days prior to surgery.  Please resume post op when felt to be safe.     The patient was advised that if she develops new symptoms prior to surgery to contact our office to arrange for a follow-up visit, and she verbalized understanding.  A copy of this note will be routed to requesting surgeon.  Time:   Today, I have spent 18 minutes with the patient with telehealth technology discussing medical history, symptoms, and management plan.     Anne SAILOR Fabry, PA-C  08/25/2024, 11:59 AM      [1]  Allergies Allergen Reactions   Penicillins Rash    Has patient had a PCN reaction causing immediate rash, facial/tongue/throat swelling, SOB or lightheadedness with hypotension: No Has patient had a PCN reaction causing severe rash involving mucus membranes or skin necrosis: no Has patient had a PCN reaction that required hospitalization: No Has patient had a PCN reaction occurring within the last 10 years: no If all of the above answers are NO, then may  proceed with Cephalosporin use.     Codeine Other (See Comments)    Possible sensitivity, patient not sure   Morphine  And Codeine     strange sensation across midsection and chest   "

## 2024-08-28 ENCOUNTER — Ambulatory Visit: Admitting: Physical Therapy

## 2024-09-27 ENCOUNTER — Ambulatory Visit: Admitting: Adult Health

## 2024-12-19 ENCOUNTER — Other Ambulatory Visit

## 2024-12-26 ENCOUNTER — Ambulatory Visit: Admitting: Internal Medicine

## 2024-12-27 ENCOUNTER — Inpatient Hospital Stay: Admitting: Internal Medicine
